# Patient Record
Sex: Male | Born: 1945 | Race: Black or African American | Hispanic: No | Marital: Single | Smoking: Former smoker
Health system: Southern US, Community
[De-identification: ages and names within clinical notes are randomized; demographics above are authoritative.]

## PROBLEM LIST (undated history)

## (undated) DIAGNOSIS — F4310 Post-traumatic stress disorder, unspecified: Secondary | ICD-10-CM

## (undated) DIAGNOSIS — E119 Type 2 diabetes mellitus without complications: Secondary | ICD-10-CM

## (undated) DIAGNOSIS — I10 Essential (primary) hypertension: Secondary | ICD-10-CM

## (undated) DIAGNOSIS — I2510 Atherosclerotic heart disease of native coronary artery without angina pectoris: Secondary | ICD-10-CM

## (undated) DIAGNOSIS — J449 Chronic obstructive pulmonary disease, unspecified: Secondary | ICD-10-CM

## (undated) DIAGNOSIS — N189 Chronic kidney disease, unspecified: Secondary | ICD-10-CM

## (undated) DIAGNOSIS — R410 Disorientation, unspecified: Secondary | ICD-10-CM

## (undated) DIAGNOSIS — L89159 Pressure ulcer of sacral region, unspecified stage: Secondary | ICD-10-CM

## (undated) DIAGNOSIS — R0600 Dyspnea, unspecified: Secondary | ICD-10-CM

## (undated) DIAGNOSIS — J189 Pneumonia, unspecified organism: Secondary | ICD-10-CM

## (undated) DIAGNOSIS — I739 Peripheral vascular disease, unspecified: Secondary | ICD-10-CM

## (undated) DIAGNOSIS — D649 Anemia, unspecified: Secondary | ICD-10-CM

## (undated) DIAGNOSIS — Z89612 Acquired absence of left leg above knee: Secondary | ICD-10-CM

## (undated) DIAGNOSIS — I509 Heart failure, unspecified: Secondary | ICD-10-CM

## (undated) HISTORY — DX: Heart failure, unspecified: I50.9

## (undated) HISTORY — PX: ABOVE KNEE LEG AMPUTATION: SUR20

## (undated) HISTORY — PX: LEG AMPUTATION ABOVE KNEE: SHX117

## (undated) HISTORY — PX: ABDOMINAL SURGERY: SHX537

---

## 2000-09-08 ENCOUNTER — Emergency Department (HOSPITAL_COMMUNITY): Admission: EM | Admit: 2000-09-08 | Discharge: 2000-09-08 | Payer: Self-pay | Admitting: *Deleted

## 2008-08-18 ENCOUNTER — Emergency Department: Payer: Self-pay | Admitting: Emergency Medicine

## 2008-08-18 ENCOUNTER — Other Ambulatory Visit: Payer: Self-pay

## 2009-12-12 ENCOUNTER — Emergency Department: Payer: Self-pay | Admitting: Emergency Medicine

## 2010-09-07 ENCOUNTER — Emergency Department: Payer: Self-pay | Admitting: Emergency Medicine

## 2014-08-30 ENCOUNTER — Non-Acute Institutional Stay (SKILLED_NURSING_FACILITY): Payer: PRIVATE HEALTH INSURANCE | Admitting: Internal Medicine

## 2014-08-30 ENCOUNTER — Encounter: Payer: Self-pay | Admitting: *Deleted

## 2014-08-30 ENCOUNTER — Non-Acute Institutional Stay: Payer: PRIVATE HEALTH INSURANCE | Admitting: Internal Medicine

## 2014-08-30 DIAGNOSIS — E1159 Type 2 diabetes mellitus with other circulatory complications: Secondary | ICD-10-CM

## 2014-08-30 DIAGNOSIS — L03119 Cellulitis of unspecified part of limb: Secondary | ICD-10-CM

## 2014-08-30 DIAGNOSIS — L89609 Pressure ulcer of unspecified heel, unspecified stage: Secondary | ICD-10-CM

## 2014-08-30 DIAGNOSIS — L02419 Cutaneous abscess of limb, unspecified: Secondary | ICD-10-CM

## 2014-08-30 NOTE — Telephone Encounter (Signed)
error 

## 2014-09-05 NOTE — Progress Notes (Addendum)
Patient ID: Blake Villarreal, male   DOB: 06-Nov-1946, 68 y.o.   MRN: [id number]               HISTORY & PHYSICAL  DATE:  69/15/2015    FAC[facility]    LEVEL OF CARE:   SNF   CHIEF COMPLAINT:  Admission to SNF, post transfer from the [facility], exact dates uncertain.    HISTORY OF PRESENT ILLNESS:  This is a 68 year-old man with a history of type 2 diabetes, neuropathy and vasculopathy.     He has a recent history of a fem-fem and left fem posterior tibial artery bypass on 04/29/2014.  He developed wound infection and he underwent bilateral groin explorations on 05/17/2014.  He had bilateral wound VACs applied at that time.  He was noted to have a right lower extremity pressure ulcer and he went to the OR on 05/24/2014 with plastic surgery for a muscle flap coverage of the deficits.  He had positive cultures from these wounds and Infectious Disease followed.  A CT scan at the time showed an 8 x 6 x 16 cm fluid collection in the right groin, compatible with an abscess.  He went to the OR on 06/06/2014 for a washout.  He was identified as having VRE.  He had vancomycin and then daptomycin.  However, he is listed as being allergic to daptomycin.    PAST MEDICAL HISTORY/PROBLEM LIST:  PTSD.    Continued tobacco use.    Hypertension.    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    Diabetic foot ulcers.    Dry skin.    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    BPH.    History of methicillin-resistant Staph aureus infection.   On Septra continuously for suppression.    Hyponatremia.    Major depression.    Alcohol dependence.    PAST SURGICAL HISTORY:    On 04/29/2014, he underwent a fem-fem and left fem distal arterial bypass.    On 05/17/2014, underwent bilateral groin explorations, requiring postoperative wound VACs.    On 05/24/2014, plastic surgery for muscle flap coverage.    On 06/06/2014, OR for washout of the right groin abscess.    CURRENT  MEDICATIONS:  Discharge medications include:      Tylenol 975 p.o. b.i.d. for pain, which is routine.    Amlodipine 10 mg daily.    ASA 81 q.d.    Atorvastatin 20 q.d.    Vitamin B12 1000 q.d.    Senokot-S, 2 tablets p.o. b.i.d.    Ferrous sulfate 325 daily.    Neurontin 200 b.i.d.    Magnesium oxide 800 b.i.d.    Remeron 15 q.h.s.    Nicotine lozenges q.i.d.    Oxycodone 5 mg, 2 tablets p.o. q.6 h p.r.n.    Risperidone 0.5 q.h.s.    Septra DS, 1 p.o. b.i.d. continuously for suppressive therapy.    Terazosin 5 mg q.h.s. for BPH.    Vitamin D, 50,000 U monthly.    SOCIAL HISTORY:   HOUSING:  The patient tells me he lives in an apartment in [place].   FUNCTIONAL STATUS:  Claims to have been independent.  He has a motorized scooter.   TOBACCO USE:  Continued smoker.    REVIEW OF SYSTEMS:   CHEST/RESPIRATORY:  No shortness of breath.  No cough.   CARDIAC:   No chest pain.   GI:  No abdominal pain.   MUSCULOSKELETAL:  Severe pain in the  right foot, some of which sounds like claudication.    PHYSICAL EXAMINATION:   GENERAL APPEARANCE:  Pleasant man in no distress.  I had to bring him in from the outside where he went to smoke.   CHEST/RESPIRATORY:  Clear air entry bilaterally.   CARDIOVASCULAR:  CARDIAC:   Heart sounds are normal.  There are no murmurs.   GASTROINTESTINAL:  LIVER/SPLEEN/KIDNEYS:  No liver, no spleen.  No tenderness.   GENITOURINARY:  BLADDER:   No suprapubic or costovertebral angle tenderness.   SKIN:  INSPECTION:  In the groin, I did not see any open areas here.  In the left anterior thigh is a sinus that is packed.  He has tremendous thickened skin in his left greater than right foot which I am sure is partially tinea, partially chronic venous disease.  He has eschar over his bilateral heels.  On the lateral part of his right leg is an open, ?surgical wound.  Again, covered with black eschar.  On the left foot, there is eschar over his toes and  also the base of the plantar aspect of the toes.   CIRCULATION:   ARTERIAL:   Strangely enough, although he has severe PVD, his feet, including his distal feet, feel warm.  Peripheral pulses, however, were not palpable.    ASSESSMENT/PLAN:  Type 2 diabetes with peripheral vascular disease, with a prolonged stay at the [name].  I think he was there from 05/30/2014 through 08/30/2014 for treatment of infected surgical wounds.  He is on aspirin, not Plavix.    Ischemic wounds on the bilateral heels, left toes, right anterior leg, and a draining sinus in the left thigh anteriorly.  All of these are surgical wounds.  None of them appear to be infected.    Hyperlipidemia.  On atorvastatin 10 mg.    Risperidone.  Probably used for posttraumatic stress.    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  I really do not have information here on this.    BPH.  There was no evidence of urinary retention.    On magnesium replacement.  His magnesium level will need to be checked.    Anemia.   His hemoglobin on 07/23/2014 was 8.6.  Indices normal.  White count and platelet count normal.  Differential count normal.    The tone of the treatment for his remaining wounds was palliative, although there have been comments that the heels themselves have improved.    He is not on any medication for his diabetes.  I am going to put him on surveillance CBGs.  Hemoglobin A1c might also be in order.    There was a suggestion that his graft material in the right groin is still infected and there is episodic drainage.

## 2014-09-09 ENCOUNTER — Non-Acute Institutional Stay (SKILLED_NURSING_FACILITY): Payer: PRIVATE HEALTH INSURANCE | Admitting: Internal Medicine

## 2014-09-09 DIAGNOSIS — E1159 Type 2 diabetes mellitus with other circulatory complications: Secondary | ICD-10-CM

## 2014-09-09 DIAGNOSIS — L89609 Pressure ulcer of unspecified heel, unspecified stage: Secondary | ICD-10-CM

## 2014-09-09 NOTE — Progress Notes (Signed)
Patient ID: JESSI JESSOP, male   DOB: 1945/12/19, 68 y.o.   MRN: [id number] Facility; [facility] Chief complaint; review of lower extremity wounds History; this is a patient that I admitted to the facility last week. Prior to this he was at [facility] and extensive set of difficult procedures in late June of this year related to peripheral vascular disease, attempts at revascularization, wound infections. He came here with ischemic wounds on his bilateral heels, right lateral leg, several of his toes on the right side, and also a draining sinus at the surgical incision in his left upper thigh. Has a tone of the discharge and treatment plan was purely palliative, the patient wanted a palliative approach to his condition including his lower extremity pain. I really didn't write aggressive wound care orders.   Lab work has come back showing a platelet cound of 546 (no doubt reactive). His glucose is 146 otherwise his basic metabolic panel is normal. BUN 10 creatinine 0.88 magnesium is 1.9  He continues to complain of pain in his bilateral feet which I think is claudication.  Physical examination Skin; his right great toenail was hanging off for a period using forceps and scalpel I removed this. I also crosshatched the eschar on his right lateral leg and right heel. The previous sinus in the surgical site in the left upper thighs closed over for the moment. I explored his right groin and I didn't see any open areas although the area will need to be cleaned.  Respiratory shallow but otherwise clear entry bilaterally Cardiac heart sounds are normal he appears to be euvolemic  Impression/plan #1 type 2 diabetes with severe PVD. In spite of the ominous prognosis here his right forefoot actually feels warm although slightly less so than the left. This is not this setting where I would've said it would be impossible to gain any healing of these wounds although the prognosis is no doubt poor. I have attempted  to remove some of the eschar on the right leg. We'll change the dressingsto santyl/ Medihoney 1.1. The eschar over the left heel is too thick for me to do anything with now will continue with moist gauze. I removed his nail on the right great toe with local anesthesia as it was ready to fall off. His nail bed had purulent looking material but without overt infection. Will use silver alginate here, #2 type 2 diabetes not currently on any agents/treatment; blood sugars are 160 in the morning to mid 200s later in the day. Think we can start him on an oral agent likely metformin #3 continued lower extremity pain which I think is claudication considering putting him on long-acting narcotics however all above for now. #4 He is a continued smoker. Ive talked to him about this

## 2014-09-09 NOTE — Addendum Note (Signed)
Addended by: Maxwell Caul on: 09/09/2014 10:02 AM   Modules accepted: Level of Service, SmartSet

## 2014-09-09 NOTE — Progress Notes (Signed)
Patient ID: Blake Villarreal, male   DOB: 1946-08-08, 68 y.o.   MRN: [id number]   HISTORY & PHYSICAL   DATE: 08/30/2014  [facility]  LEVEL OF CARE: SNF   CHIEF COMPLAINT: Admission to SNF, post transfer from the [facility], exact dates uncertain.  HISTORY OF PRESENT ILLNESS: This is a 68 year-old man with a history of type 2 diabetes, neuropathy and vasculopathy.  He has a recent history of a fem-fem and left fem posterior tibial artery bypass on 04/29/2014. He developed wound infection and he underwent bilateral groin explorations on 05/17/2014. He had bilateral wound VACs applied at that time. He was noted to have a right lower extremity pressure ulcer and he went to the OR on 05/24/2014 with plastic surgery for a muscle flap coverage of the deficits. He had positive cultures from these wounds and Infectious Disease followed. A CT scan at the time showed an 8 x 6 x 16 cm fluid collection in the right groin, compatible with an abscess. He went to the OR on 06/06/2014 for a washout. He was identified as having VRE. He had vancomycin and then daptomycin. However, he is listed as being allergic to daptomycin.  PAST MEDICAL HISTORY/PROBLEM LIST:  PTSD.  Continued tobacco use.  Hypertension.  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  Diabetic foot ulcers.  Dry skin.  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  BPH.  History of methicillin-resistant Staph aureus infection. On Septra continuously for suppression.  Hyponatremia.  Major depression.  Alcohol dependence.  PAST SURGICAL HISTORY:  On 04/29/2014, he underwent a fem-fem and left fem distal arterial bypass.  On 05/17/2014, underwent bilateral groin explorations, requiring postoperative wound VACs.  On 05/24/2014, plastic surgery for muscle flap coverage.  On 06/06/2014, OR for washout of the right groin abscess.  CURRENT MEDICATIONS: Discharge medications include:  Tylenol 975 p.o. b.i.d. for pain, which is routine.   Amlodipine 10 mg daily.  ASA 81 q.d.  Atorvastatin 20 q.d.  Vitamin B12 1000 q.d.  Senokot-S, 2 tablets p.o. b.i.d.  Ferrous sulfate 325 daily.  Neurontin 200 b.i.d.  Magnesium oxide 800 b.i.d.  Remeron 15 q.h.s.  Nicotine lozenges q.i.d.  Oxycodone 5 mg, 2 tablets p.o. q.6 h p.r.n.  Risperidone 0.5 q.h.s.  Septra DS, 1 p.o. b.i.d. continuously for suppressive therapy.  Terazosin 5 mg q.h.s. for BPH.  Vitamin D, 50,000 U monthly.   SOCIAL HISTORY:  HOUSING: The patient tells me he lives in an apartment in [place].  FUNCTIONAL STATUS: Claims to have been independent. He has a motorized scooter.  TOBACCO USE: Continued smoker.  REVIEW OF SYSTEMS:  CHEST/RESPIRATORY: No shortness of breath. No cough.  CARDIAC: No chest pain.  GI: No abdominal pain.  MUSCULOSKELETAL: Severe pain in the right foot, some of which sounds like claudication.  PHYSICAL EXAMINATION:  GENERAL APPEARANCE: Pleasant man in no distress. I had to bring him in from the outside where he went to smoke.  CHEST/RESPIRATORY: Clear air entry bilaterally.  CARDIOVASCULAR:  CARDIAC: Heart sounds are normal. There are no murmurs.  GASTROINTESTINAL:  LIVER/SPLEEN/KIDNEYS: No liver, no spleen. No tenderness.  GENITOURINARY:  BLADDER: No suprapubic or costovertebral angle tenderness.  SKIN:  INSPECTION: In the groin, I did not see any open areas here. In the left anterior thigh is a sinus that is packed. He has tremendous thickened skin in his left greater than right foot which I am sure is partially tinea, partially chronic venous disease. He has eschar over his bilateral  heels. On the lateral part of his right leg is an open, ?surgical wound. Again, covered with black eschar. On the left foot, there is eschar over his toes and also the base of the plantar aspect of the toes.  CIRCULATION:  ARTERIAL: Strangely enough, although he has severe PVD, his feet, including his distal feet, feel warm. Peripheral pulses, however,  were not palpable.   ASSESSMENT/PLAN:  Type 2 diabetes with peripheral vascular disease, with a prolonged stay at the [name]. I think he was there from 05/30/2014 through 08/30/2014 for treatment of infected surgical wounds. He is on aspirin, not Plavix.  Ischemic wounds on the bilateral heels, left toes, right anterior leg, and a draining sinus in the left thigh anteriorly. All of these are surgical wounds. None of them appear to be infected.  Hyperlipidemia. On atorvastatin 10 mg.  Risperidone. Probably used for posttraumatic stress.  History of MRSA and VRE. On chronic suppressive Septra. I really do not have information here on this.  BPH. There was no evidence of urinary retention.  On magnesium replacement. His magnesium level will need to be checked.  Anemia. His hemoglobin on 07/23/2014 was 8.6. Indices normal. White count and platelet count normal. Differential count normal.   The tone of the treatment for his remaining wounds was palliative, although there have been comments that the heels themselves have improved.  He is not on any medication for his diabetes. I am going to put him on surveillance CBGs. Hemoglobin A1c might also be in order.   There was a suggestion that his graft material in the right groin is still infected and there is episodic drainage.

## 2014-09-09 NOTE — Progress Notes (Signed)
This encounter was created in error - please disregard.

## 2014-09-15 ENCOUNTER — Non-Acute Institutional Stay (SKILLED_NURSING_FACILITY): Payer: PRIVATE HEALTH INSURANCE | Admitting: Internal Medicine

## 2014-09-15 DIAGNOSIS — L97511 Non-pressure chronic ulcer of other part of right foot limited to breakdown of skin: Secondary | ICD-10-CM

## 2014-09-15 DIAGNOSIS — E1151 Type 2 diabetes mellitus with diabetic peripheral angiopathy without gangrene: Secondary | ICD-10-CM

## 2014-09-15 DIAGNOSIS — L97211 Non-pressure chronic ulcer of right calf limited to breakdown of skin: Secondary | ICD-10-CM

## 2014-09-19 NOTE — Progress Notes (Signed)
Patient ID: Blake Villarreal, male   DOB: 1946-06-01, 68 y.o.   MRN: [id number]               PROGRESS NOTE  DATE:  09/15/2014    [facility]    LEVEL OF CARE:   SNF   Acute Visit   HISTORY OF PRESENT ILLNESS:  This is a patient whom I admitted to the facility earlier last month.  Prior to this, [facility] and had an extensive set of difficult procedures in late June of this year related to peripheral vascular disease and attempts at revascularization, wound infections, etc.    He came to this facility with ischemic wounds on his bilateral heels and right lateral leg.  In spite of the ominous prognosis for healing these, I felt that he had enough circulation to consider an attempt to help the wounds, especially on his right leg.  When I was here last time, I cross-hatched the wounds on the right heel and right lateral leg.    PHYSICAL EXAMINATION:   SKIN:  INSPECTION:  I removed the thick black eschar on the right heel and also re-cross-hatched the eschar on the right lateral leg.  We will continue to use the Santyl/Medihoney based dressings to this area, and this will require further debridements.    ASSESSMENT/PLAN:  Type 2 diabetes with severe peripheral vascular disease.  We will continue the Santyl/Medihoney based dressings.  Status post mechanical debridement today.    CPT CODE: [id number]

## 2014-09-30 ENCOUNTER — Non-Acute Institutional Stay (SKILLED_NURSING_FACILITY): Payer: PRIVATE HEALTH INSURANCE | Admitting: Internal Medicine

## 2014-09-30 DIAGNOSIS — L97211 Non-pressure chronic ulcer of right calf limited to breakdown of skin: Secondary | ICD-10-CM

## 2014-09-30 DIAGNOSIS — E1151 Type 2 diabetes mellitus with diabetic peripheral angiopathy without gangrene: Secondary | ICD-10-CM

## 2014-10-05 NOTE — Progress Notes (Addendum)
Patient ID: Blake Villarreal, male   DOB: 03/10/1946, 68 y.o.   MRN: [id number]                PROGRESS NOTE  DATE:  09/30/2014    FACILITY: [facility]    LEVEL OF CARE:   SNF   Acute Visit   CHIEF COMPLAINT:  Follow up pain, wound review.    HISTORY OF PRESENT ILLNESS:  This is a gentleman who came to us [facility] the [facility] with an extensive history of attempts at revascularization and wound complications.  He is a type 2 diabetic.  He suffered infectious complications of extensive surgical wounds.    He came here with a draining sinus in the left anterior thigh and right groin.  Both of these have closed over.    He had ischemic-looking wounds on the right lateral leg, right heel, left heel.  We have been using Santyl and Medihoney to these areas.    He also had thick, lichenified skin on both feet, especially the right.  We have been using foot soaks, lubricating cream.  Most of this on the right foot has come off.  Unfortunately, he has been left with more wound area on the right lateral foot.     More problematic than this, he has developed pretty significant pain in the right leg.  I suspect this is indeed claudication type pain.    PHYSICAL EXAMINATION:   SKIN:  INSPECTION:   Right leg:  There are ischemic wounds on the right lateral leg, right heel, now the right lateral foot.  More worrisome than this, the area on the dorsal aspect of the right foot appears to be draining.    ASSESSMENT/PLAN:  PAD with pain at rest.  I had some optimism about this initially, although I think this was probably ill-founded.  I am going to continue Santyl-based dressings to these wounds, as well as silver alginate over the foot.  I am going to start him on doxycycline, although I think what is happening in the right foot is probably ischemic.  His pain issues are real.  He has oxycodone 5 mg q.6 hours for mild to moderate pain, 10 mg for severe pain.  I am going to put him on OxyContin,  change him to the tube p.o. q.6 hours p.r.n.

## 2014-10-14 ENCOUNTER — Non-Acute Institutional Stay (SKILLED_NURSING_FACILITY): Payer: PRIVATE HEALTH INSURANCE | Admitting: Internal Medicine

## 2014-10-14 DIAGNOSIS — L97511 Non-pressure chronic ulcer of other part of right foot limited to breakdown of skin: Secondary | ICD-10-CM

## 2014-10-14 DIAGNOSIS — L97211 Non-pressure chronic ulcer of right calf limited to breakdown of skin: Secondary | ICD-10-CM

## 2014-10-14 DIAGNOSIS — E1151 Type 2 diabetes mellitus with diabetic peripheral angiopathy without gangrene: Secondary | ICD-10-CM

## 2014-10-18 NOTE — Progress Notes (Signed)
Patient ID: Blake Villarreal, male   DOB: 1946-10-09, 68 y.o.   MRN: [id number]               PROGRESS NOTE  DATE:  10/14/2014     FACILITY: [facility]    LEVEL OF CARE:   SNF   Acute Visit   CHIEF COMPLAINT:  Declining situation in right foot.    HISTORY OF PRESENT ILLNESS:  Blake Villarreal is a gentleman who came here after a complex stay at the [facility].  He had attempts at revascularization.  He developed wound dehiscence and infection.  He ultimately required plastic surgery.    When he came here, he had ischemic wounds on his right lateral foot, right heel, and right distal leg.  He has a wound with eschar on the left heel, although this has remained stable.    Unfortunately, he has developed worsening ischemic change in the right forefoot.  This is compatible with advancing dry gangrene and now involving most of his forefoot.  He has been in increasing pain.   My understanding is that, from a revascularization point of view, everything that could be done was done while he was at the hospital before he came here.  This was a palliative situation.  However, the increasing pain and increasing amount of forefoot necrosis likely will require a definitive amputation.     PHYSICAL EXAMINATION:   SKIN:  INSPECTION:  Right foot:  There is advancing gangrene at the top of the foot on the dorsal aspect.  The wound on the right lateral leg and right heel are roughly stable.  He has had increasing difficulties with discomfort.  This does not appear to be infected.     ASSESSMENT/PLAN:  Type 2 diabetes with PAD, advancing gangrene of the right foot.  I have increased his OxyContin from 30 to 50 b.i.d.  He has oxycodone p.r.n.  He is outside to smoke most of the day.  This, no doubt, has contributed.  Nevertheless, I think this is a deteriorating situation.  I think he is going to need an amputation which will probably be above-knee, although I will leave that to [facility].  I have  spoken to him about this in some detail and the plan would be to send him back to [facility] to address increasing painful ischemia of the right foot.    CPT CODE: [id number]

## 2016-01-06 ENCOUNTER — Other Ambulatory Visit
Admission: RE | Admit: 2016-01-06 | Discharge: 2016-01-06 | Disposition: A | Discharge status: Home / Self Care | Payer: Medicare Other | Source: Ambulatory Visit | Attending: Physician Assistant | Admitting: Physician Assistant

## 2016-01-06 DIAGNOSIS — L089 Local infection of the skin and subcutaneous tissue, unspecified: Secondary | ICD-10-CM | POA: Diagnosis present

## 2016-01-06 DIAGNOSIS — B9562 Methicillin resistant Staphylococcus aureus infection as the cause of diseases classified elsewhere: Secondary | ICD-10-CM | POA: Insufficient documentation

## 2016-01-06 LAB — VANCOMYCIN, TROUGH
Vancomycin Tr: 15 ug/mL (ref 10–20)
Vancomycin Tr: 38 ug/mL (ref 10–20)

## 2016-01-10 ENCOUNTER — Other Ambulatory Visit
Admission: RE | Admit: 2016-01-10 | Discharge: 2016-01-10 | Disposition: A | Discharge status: Home / Self Care | Payer: Self-pay | Source: Ambulatory Visit | Attending: Physician Assistant | Admitting: Physician Assistant

## 2016-01-10 DIAGNOSIS — A4902 Methicillin resistant Staphylococcus aureus infection, unspecified site: Secondary | ICD-10-CM | POA: Insufficient documentation

## 2016-01-10 DIAGNOSIS — T148 Other injury of unspecified body region: Secondary | ICD-10-CM | POA: Insufficient documentation

## 2016-01-10 LAB — CREATININE, SERUM
Creatinine, Ser: 0.9 mg/dL (ref 0.61–1.24)
GFR calc Af Amer: >60 mL/min (ref >60)
GFR calc non Af Amer: >60 mL/min (ref >60)

## 2016-01-10 LAB — VANCOMYCIN, TROUGH: Vancomycin Tr: 10 ug/mL (ref 10–20)

## 2016-01-15 ENCOUNTER — Other Ambulatory Visit
Admission: RE | Admit: 2016-01-15 | Discharge: 2016-01-15 | Disposition: A | Discharge status: Home / Self Care | Payer: Medicare Other | Source: Ambulatory Visit | Attending: Nurse Practitioner | Admitting: Nurse Practitioner

## 2016-01-15 DIAGNOSIS — L02214 Cutaneous abscess of groin: Secondary | ICD-10-CM | POA: Diagnosis present

## 2016-01-15 LAB — CREATININE, SERUM
CREATININE: 0.89 mg/dL (ref 0.61–1.24)
GFR calc Af Amer: >60 mL/min (ref >60)

## 2016-01-15 LAB — VANCOMYCIN, TROUGH: VANCOMYCIN TR: 11 ug/mL (ref 10–20)

## 2016-01-18 ENCOUNTER — Other Ambulatory Visit
Admission: RE | Admit: 2016-01-18 | Discharge: 2016-01-18 | Disposition: A | Discharge status: Home / Self Care | Payer: Medicare Other | Source: Ambulatory Visit | Attending: Physician Assistant | Admitting: Physician Assistant

## 2016-01-20 ENCOUNTER — Other Ambulatory Visit
Admission: RE | Admit: 2016-01-20 | Discharge: 2016-01-20 | Disposition: A | Discharge status: Home / Self Care | Payer: Self-pay | Source: Skilled Nursing Facility | Attending: Physician Assistant | Admitting: Physician Assistant

## 2016-01-20 DIAGNOSIS — A4902 Methicillin resistant Staphylococcus aureus infection, unspecified site: Secondary | ICD-10-CM | POA: Insufficient documentation

## 2016-01-20 LAB — CREATININE, SERUM: CREATININE: 0.79 mg/dL (ref 0.61–1.24)

## 2016-01-20 LAB — VANCOMYCIN, TROUGH: VANCOMYCIN TR: 9 ug/mL — AB (ref 10–20)

## 2017-05-02 ENCOUNTER — Other Ambulatory Visit
Admission: RE | Admit: 2017-05-02 | Discharge: 2017-05-02 | Disposition: A | Discharge status: Home / Self Care | Payer: Medicare Other | Source: Other Acute Inpatient Hospital | Attending: Family Medicine | Admitting: Family Medicine

## 2017-05-02 DIAGNOSIS — Z114 Encounter for screening for human immunodeficiency virus [HIV]: Secondary | ICD-10-CM | POA: Diagnosis present

## 2017-05-02 DIAGNOSIS — Z1159 Encounter for screening for other viral diseases: Secondary | ICD-10-CM | POA: Diagnosis not present

## 2017-05-02 LAB — RAPID HIV SCREEN (HIV 1/2 AB+AG)
HIV 1/2 Antibodies: NONREACTIVE
HIV-1 P24 Antigen - HIV24: NONREACTIVE

## 2017-05-07 LAB — HEPATITIS B SURFACE ANTIGEN: Hepatitis B Surface Ag: NEGATIVE

## 2017-05-30 ENCOUNTER — Emergency Department
Admission: EM | Admit: 2017-05-30 | Discharge: 2017-05-30 | Payer: Medicare Other | Attending: Emergency Medicine | Admitting: Emergency Medicine

## 2017-05-30 ENCOUNTER — Emergency Department: Payer: Medicare Other

## 2017-05-30 DIAGNOSIS — Z7984 Long term (current) use of oral hypoglycemic drugs: Secondary | ICD-10-CM | POA: Insufficient documentation

## 2017-05-30 DIAGNOSIS — E119 Type 2 diabetes mellitus without complications: Secondary | ICD-10-CM | POA: Insufficient documentation

## 2017-05-30 DIAGNOSIS — R000 Tachycardia, unspecified: Secondary | ICD-10-CM | POA: Insufficient documentation

## 2017-05-30 DIAGNOSIS — R1110 Vomiting, unspecified: Secondary | ICD-10-CM | POA: Diagnosis not present

## 2017-05-30 DIAGNOSIS — A419 Sepsis, unspecified organism: Secondary | ICD-10-CM | POA: Diagnosis not present

## 2017-05-30 DIAGNOSIS — Z794 Long term (current) use of insulin: Secondary | ICD-10-CM | POA: Insufficient documentation

## 2017-05-30 DIAGNOSIS — R509 Fever, unspecified: Secondary | ICD-10-CM | POA: Diagnosis present

## 2017-05-30 DIAGNOSIS — Z79899 Other long term (current) drug therapy: Secondary | ICD-10-CM | POA: Insufficient documentation

## 2017-05-30 HISTORY — DX: Type 2 diabetes mellitus without complications: E11.9

## 2017-05-30 LAB — COMPREHENSIVE METABOLIC PANEL
ALBUMIN: 3.7 g/dL (ref 3.5–5.0)
ALK PHOS: 62 U/L (ref 38–126)
ALT: 8 U/L — ABNORMAL LOW (ref 17–63)
ANION GAP: 8 (ref 5–15)
AST: 17 U/L (ref 15–41)
BUN: 19 mg/dL (ref 6–20)
CALCIUM: 9.4 mg/dL (ref 8.9–10.3)
CHLORIDE: 106 mmol/L (ref 101–111)
CO2: 22 mmol/L (ref 22–32)
Creatinine, Ser: 0.99 mg/dL (ref 0.61–1.24)
GFR calc non Af Amer: >60 mL/min (ref >60)
GLUCOSE: 135 mg/dL — AB (ref 65–99)
POTASSIUM: 3.9 mmol/L (ref 3.5–5.1)
SODIUM: 136 mmol/L (ref 135–145)
Total Bilirubin: 0.5 mg/dL (ref 0.3–1.2)
Total Protein: 7.7 g/dL (ref 6.5–8.1)

## 2017-05-30 LAB — CBC WITH DIFFERENTIAL/PLATELET
BASOS PCT: 1 %
Basophils Absolute: 0.1 10*3/uL (ref 0–0.1)
Eosinophils Absolute: 0 10*3/uL (ref 0–0.7)
Eosinophils Relative: 0 %
HEMATOCRIT: 30.1 % — AB (ref 40.0–52.0)
HEMOGLOBIN: 9.8 g/dL — AB (ref 13.0–18.0)
LYMPHS PCT: 4 %
Lymphs Abs: 0.9 10*3/uL — ABNORMAL LOW (ref 1.0–3.6)
MCH: 28 pg (ref 26.0–34.0)
MCHC: 32.5 g/dL (ref 32.0–36.0)
MCV: 86.1 fL (ref 80.0–100.0)
MONO ABS: 0.6 10*3/uL (ref 0.2–1.0)
Monocytes Relative: 3 %
NEUTROS ABS: 19.1 10*3/uL — AB (ref 1.4–6.5)
NEUTROS PCT: 92 %
Platelets: 361 10*3/uL (ref 150–440)
RBC: 3.49 MIL/uL — ABNORMAL LOW (ref 4.40–5.90)
RDW: 15.7 % — ABNORMAL HIGH (ref 11.5–14.5)
WBC: 20.6 10*3/uL — ABNORMAL HIGH (ref 3.8–10.6)

## 2017-05-30 LAB — URINALYSIS, COMPLETE (UACMP) WITH MICROSCOPIC
BACTERIA UA: NONE SEEN
Bilirubin Urine: NEGATIVE
Glucose, UA: NEGATIVE mg/dL
KETONES UR: 5 mg/dL — AB
Leukocytes, UA: NEGATIVE
NITRITE: NEGATIVE
PROTEIN: 30 mg/dL — AB
Specific Gravity, Urine: 1.02 (ref 1.005–1.030)
pH: 5 (ref 5.0–8.0)

## 2017-05-30 LAB — LACTIC ACID, PLASMA: Lactic Acid, Venous: 1.6 mmol/L (ref 0.5–1.9)

## 2017-05-30 MED ORDER — VANCOMYCIN HCL IN DEXTROSE 1-5 GM/200ML-% IV SOLN
1000.0000 mg | Freq: Once | INTRAVENOUS | Status: AC
  Administered 2017-05-30: 1000 mg via INTRAVENOUS
  Filled 2017-05-30: qty 200

## 2017-05-30 MED ORDER — ACETAMINOPHEN 500 MG PO TABS
1000.0000 mg | ORAL_TABLET | Freq: Once | ORAL | Status: AC
  Administered 2017-05-30: 1000 mg via ORAL
  Filled 2017-05-30: qty 2

## 2017-05-30 MED ORDER — IOPAMIDOL (ISOVUE-300) INJECTION 61%
100.0000 mL | Freq: Once | INTRAVENOUS | Status: AC | PRN
  Administered 2017-05-30: 100 mL via INTRAVENOUS

## 2017-05-30 MED ORDER — DEXTROSE 5 % IV SOLN
1.0000 g | Freq: Once | INTRAVENOUS | Status: AC
  Administered 2017-05-30: 1 g via INTRAVENOUS
  Filled 2017-05-30: qty 10

## 2017-05-30 MED ORDER — SODIUM CHLORIDE 0.9 % IV BOLUS (SEPSIS)
1000.0000 mL | Freq: Once | INTRAVENOUS | Status: AC
  Administered 2017-05-30: 1000 mL via INTRAVENOUS

## 2017-05-30 NOTE — ED Provider Notes (Signed)
[facility] Emergency Department Provider Note  ____________________________________________  Time seen: Approximately 3:17 PM  I have reviewed the triage vital signs and the nursing notes.   HISTORY  Chief Complaint Fever; Emesis; and Tachycardia   HPI Blake Villarreal is a 71 y.o. male with h/o DM, R AKA, non healing Left foot ulcer, PVD, HNT who presents for evaluation offever. Patient was in his usual state of health yesterday evening. Today he spiked a fever of 102.30F. He had 2 episodes of nonbloody nonbilious emesis. He denies diarrhea, cough, congestion, headaches, neck stiffness, rash, shortness of breath, chest pain, abdominal pain. He tells me that he is incontinent of urine and stool . Patient denies increased stool output. Denies dysuria. Patient tells me that he feels great and has no complaints at this time.  Past Medical History:  Diagnosis Date  . Diabetes mellitus without complication (HCC)     There are no active problems to display for this patient.   Past Surgical History:  Procedure Laterality Date  . ABOVE KNEE LEG AMPUTATION      Prior to Admission medications   Medication Sig Start Date End Date Taking? Authorizing Provider  acetaminophen (TYLENOL) 325 MG tablet Take 650 mg by mouth every 6 (six) hours as needed.   Yes [provider]  albuterol (PROVENTIL) (2.5 MG/3ML) 0.083% nebulizer solution Take 2 ampules by nebulization every 6 (six) hours as needed for wheezing.   Yes [provider]  amLODipine (NORVASC) 10 MG tablet Take 10 mg by mouth daily.   Yes [provider]  atorvastatin (LIPITOR) 20 MG tablet Take 20 mg by mouth daily.   Yes [provider]  Cholecalciferol (VITAMIN D3) 2000 units TABS Take 1 tablet by mouth daily.   Yes [provider]  docusate sodium (COLACE) 100 MG capsule Take 100 mg by mouth daily.   Yes [provider]  doxycycline (DORYX) 100 MG EC  tablet Take 100 mg by mouth 2 (two) times daily.   Yes [provider]  ferrous sulfate 325 (65 FE) MG EC tablet Take 325 mg by mouth 2 (two) times daily.   Yes [provider]  gabapentin (NEURONTIN) 100 MG capsule Take 100 mg by mouth 3 (three) times daily.   Yes [provider]  hydrocerin (EUCERIN) CREA Apply 1 application topically 2 (two) times daily.   Yes [provider]  HYDROcodone-acetaminophen (NORCO/VICODIN) 5-325 MG tablet Take 1 tablet by mouth every 6 (six) hours as needed for moderate pain.   Yes [provider]  insulin glargine (LANTUS) 100 UNIT/ML injection Inject 10 Units into the skin at bedtime.   Yes [provider]  lisinopril (PRINIVIL,ZESTRIL) 40 MG tablet Take 40 mg by mouth daily.   Yes [provider]  magnesium oxide (MAG-OX) 400 MG tablet Take 17 mg by mouth daily.   Yes [provider]  metFORMIN (GLUCOPHAGE) 500 MG tablet Take 500 mg by mouth 2 (two) times daily with a meal.   Yes [provider]  mirtazapine (REMERON) 15 MG tablet Take 7.5 mg by mouth at bedtime.   Yes [provider]  omeprazole (PRILOSEC) 20 MG capsule Take 20 mg by mouth daily.   Yes [provider]  risperiDONE (RISPERDAL) 0.25 MG tablet Take 0.25 mg by mouth at bedtime.   Yes [provider]  terazosin (HYTRIN) 5 MG capsule Take 5 mg by mouth at bedtime.   Yes [provider]  vitamin B-12 (  CYANOCOBALAMIN) 1000 MCG tablet Take 1,000 mcg by mouth daily.   Yes [provider]    Allergies Patient has no allergy information on record.  No family history on file.  Social History Social History  Substance Use Topics  . Smoking status: Never Smoker  . Smokeless tobacco: Never Used  . Alcohol use No    Review of Systems  Constitutional: + fever. Eyes: Negative for visual changes. ENT: Negative for sore throat. Neck: No neck pain  Cardiovascular: Negative for  chest pain. Respiratory: Negative for shortness of breath. Gastrointestinal: Negative for abdominal pain, vomiting or diarrhea. Genitourinary: Negative for dysuria. Musculoskeletal: Negative for back pain. Skin: Negative for rash. Neurological: Negative for headaches, weakness or numbness. Psych: No SI or HI  ____________________________________________   PHYSICAL EXAM:  VITAL SIGNS: ED Triage Vitals  Enc Vitals Group     BP 05/30/17 1501 (!) 145/59     Pulse Rate 05/30/17 1501 (!) 107     Resp 05/30/17 1501 (!) 21     Temp 05/30/17 1501 (!) 102.1 F (38.9 C)     Temp Source 05/30/17 1501 Oral     SpO2 05/30/17 1501 95 %     Weight 05/30/17 1455 165 lb (74.8 kg)     Height 05/30/17 1455 5\' 8"  (1.727 m)     Head Circumference --      Peak Flow --      Pain Score --      Pain Loc --      Pain Edu? --      Excl. in GC? --     Constitutional: Alert and oriented. Well appearing and in no apparent distress. HEENT:      Head: Normocephalic and atraumatic.         Eyes: Conjunctivae are normal. Sclera is non-icteric.       Mouth/Throat: Mucous membranes are moist.       Neck: Supple with no signs of meningismus. Cardiovascular: Tachycardic with regular rhythm. No murmurs, gallops, or rubs. 2+ symmetrical distal pulses are present in all extremities. No JVD. Respiratory: Normal respiratory effort. Lungs are clear to auscultation bilaterally with faint bilateral crackles.  Gastrointestinal: Soft, non tender, and non distended with positive bowel sounds. No rebound or guarding. Genitourinary: No CVA tenderness. Musculoskeletal: AKA on the R, non healing ulcer on the L foot with no evidence of active infection. Neurologic: Normal speech and language. Face is symmetric. Moving all extremities. No gross focal neurologic deficits are appreciated. Skin: Skin is warm, dry and intact. No rash noted. Psychiatric: Mood and affect are normal. Speech and behavior are  normal.  ____________________________________________   LABS (all labs ordered are listed, but only abnormal results are displayed)  Labs Reviewed  COMPREHENSIVE METABOLIC PANEL - Abnormal; Notable for the following:       Result Value   Glucose, Bld 135 (*)    ALT 8 (*)    All other components within normal limits  CBC WITH DIFFERENTIAL/PLATELET - Abnormal; Notable for the following:    WBC 20.6 (*)    RBC 3.49 (*)    Hemoglobin 9.8 (*)    HCT 30.1 (*)    RDW 15.7 (*)    Neutro Abs 19.1 (*)    Lymphs Abs 0.9 (*)    All other components within normal limits  URINALYSIS, COMPLETE (UACMP) WITH MICROSCOPIC - Abnormal; Notable for the following:    Color, Urine YELLOW (*)    APPearance CLEAR (*)    Hgb  urine dipstick SMALL (*)    Ketones, ur 5 (*)    Protein, ur 30 (*)    Squamous Epithelial / LPF 0-5 (*)    All other components within normal limits  CULTURE, BLOOD (ROUTINE X 2)  CULTURE, BLOOD (ROUTINE X 2)  LACTIC ACID, PLASMA   ____________________________________________  EKG  ED ECG REPORT I, Nita Sickle, the attending physician, personally viewed and interpreted this ECG.  Normal sinus rhythm, rate of 99, normal intervals, normal axis, no ST elevations or depressions, Q waves on anterior leads. Unchanged from prior from 2009 ____________________________________________  RADIOLOGY  CXR:  1. No calcaneus osteomyelitis or fracture identified. A dedicated Os calcis x-ray would be complementary in this setting. 2. No acute osseous abnormality identified. 3. Calcified peripheral vascular disease.  CT a/p: 1. Severe vascular disease with extensive aortic and branch vessel calcifications. The left common iliac artery is occluded and there is a fem-fem bypass graft which appears patent. Three-vessel coronary artery calcifications are also noted. 2. Chronic rounded atelectasis at the left lung base. 3. Extensive pleural calcifications bilaterally. 4. Left-sided  pelvic and inguinal lymphadenopathy. Could not exclude lymphoma. Biopsy of one of the inguinal lymph nodes may be helpful. No mesenteric or retroperitoneal lymphadenopathy. ____________________________________________   PROCEDURES  Procedure(s) performed: None Procedures Critical Care performed: yes  CRITICAL CARE Performed by: Nita Sickle  ?  Total critical care time: 45 min  Critical care time was exclusive of separately billable procedures and treating other patients.  Critical care was necessary to treat or prevent imminent or life-threatening deterioration.  Critical care was time spent personally by me on the following activities: development of treatment plan with patient and/or surrogate as well as nursing, discussions with consultants, evaluation of patient's response to treatment, examination of patient, obtaining history from patient or surrogate, ordering and performing treatments and interventions, ordering and review of laboratory studies, ordering and review of radiographic studies, pulse oximetry and re-evaluation of patient's condition.  ____________________________________________   INITIAL IMPRESSION / ASSESSMENT AND PLAN / ED COURSE  71 y.o. male with h/o DM, R AKA, non healing Left foot ulcer, PVD, HNT who presents for evaluation offever and two episodes of NBNB emesis. Patient has no complaints at this time howevere does meet sepsis criteria and sepsis protocol initiated. Sats are normal but patient has faint crackles on bilateral bases. Ulcer is non healed but does not look infected. Patient is receiving close care by wound clinic. Abdomen is soft and non tender. Will check labs, urine, CXR, and foot XR for possible etiology of sepsis/ Will give tylenol, IVF, and IV abx.  Clinical Course as of May 30 2344  Fri May 30, 2017  1906 Urine with no evidence of infection. CT abdomen and pelvis shows diffuse atherosclerotic disease but no signs of ischemic  bowel and low suspicion with normal lactate and no abdominal pain. Leukocytosis of 20K, unlcear source of infection. Negative XR of foot and CXR. Patient received rocephin and vancomycin. Will transfer to [facility] for further management.  [CV]    Clinical Course User Index [CV] Nita Sickle, MD    Pertinent labs & imaging results that were available during my care of the patient were reviewed by me and considered in my medical decision making (see chart for details).    ____________________________________________   FINAL CLINICAL IMPRESSION(S) / ED DIAGNOSES  Final diagnoses:  Sepsis, due to unspecified organism [facility])      NEW MEDICATIONS STARTED DURING THIS VISIT:  New Prescriptions  No medications on file     Note:  This document was prepared using Dragon voice recognition software and may include unintentional dictation errors.    Don Perking, [place], MD 05/30/17 [phone]

## 2017-05-30 NOTE — ED Notes (Signed)
Code  Sepsis  Called  To  [facility] 

## 2017-05-30 NOTE — ED Notes (Addendum)
Pt cleaned up and changed. Barrier cream applied to pt's bottom. Pt repositioned and resting comfortably.

## 2017-05-30 NOTE — ED Triage Notes (Signed)
[facility] for [facility]/o fever and tachycardia. Per EMS pt had fever of 102.9 orally and was given Tyenol at the facility with no relief. Pt states he has also been vomiting. EMS also reports [facility]. VS BP-164/74, temp 102.9 orally, HR 105 and BS 158.

## 2017-05-30 NOTE — ED Notes (Signed)
[facility] called and [facility].

## 2017-05-30 NOTE — ED Notes (Signed)
Pt had large dark colored BM. Pt cleaned up and repositioned in the bed. Pt resting comfortably

## 2017-06-04 LAB — CULTURE, BLOOD (ROUTINE X 2)
Culture: NO GROWTH
Culture: NO GROWTH

## 2017-08-15 ENCOUNTER — Emergency Department: Payer: Non-veteran care

## 2017-08-15 ENCOUNTER — Inpatient Hospital Stay: Payer: Non-veteran care

## 2017-08-15 ENCOUNTER — Encounter: Payer: Self-pay | Admitting: Emergency Medicine

## 2017-08-15 ENCOUNTER — Inpatient Hospital Stay (HOSPITAL_COMMUNITY)
Admit: 2017-08-15 | Discharge: 2017-08-15 | Disposition: A | Discharge status: Home / Self Care | Payer: Non-veteran care | Attending: Critical Care Medicine | Admitting: Critical Care Medicine

## 2017-08-15 ENCOUNTER — Inpatient Hospital Stay
Admission: EM | Admit: 2017-08-15 | Discharge: 2017-08-22 | DRG: 208 | Disposition: A | Discharge status: Home / Self Care | Payer: Non-veteran care | Attending: Internal Medicine | Admitting: Internal Medicine

## 2017-08-15 ENCOUNTER — Inpatient Hospital Stay (HOSPITAL_COMMUNITY): Payer: Non-veteran care

## 2017-08-15 DIAGNOSIS — D62 Acute posthemorrhagic anemia: Secondary | ICD-10-CM | POA: Diagnosis present

## 2017-08-15 DIAGNOSIS — K2971 Gastritis, unspecified, with bleeding: Secondary | ICD-10-CM | POA: Diagnosis present

## 2017-08-15 DIAGNOSIS — Z7189 Other specified counseling: Secondary | ICD-10-CM

## 2017-08-15 DIAGNOSIS — Z978 Presence of other specified devices: Secondary | ICD-10-CM

## 2017-08-15 DIAGNOSIS — T380X5A Adverse effect of glucocorticoids and synthetic analogues, initial encounter: Secondary | ICD-10-CM | POA: Diagnosis not present

## 2017-08-15 DIAGNOSIS — G4733 Obstructive sleep apnea (adult) (pediatric): Secondary | ICD-10-CM | POA: Diagnosis present

## 2017-08-15 DIAGNOSIS — E875 Hyperkalemia: Secondary | ICD-10-CM | POA: Diagnosis present

## 2017-08-15 DIAGNOSIS — I509 Heart failure, unspecified: Secondary | ICD-10-CM | POA: Diagnosis present

## 2017-08-15 DIAGNOSIS — Z79899 Other long term (current) drug therapy: Secondary | ICD-10-CM | POA: Diagnosis not present

## 2017-08-15 DIAGNOSIS — Z794 Long term (current) use of insulin: Secondary | ICD-10-CM | POA: Diagnosis not present

## 2017-08-15 DIAGNOSIS — G9340 Encephalopathy, unspecified: Secondary | ICD-10-CM

## 2017-08-15 DIAGNOSIS — J441 Chronic obstructive pulmonary disease with (acute) exacerbation: Secondary | ICD-10-CM | POA: Diagnosis present

## 2017-08-15 DIAGNOSIS — G9341 Metabolic encephalopathy: Secondary | ICD-10-CM | POA: Diagnosis not present

## 2017-08-15 DIAGNOSIS — E872 Acidosis, unspecified: Secondary | ICD-10-CM

## 2017-08-15 DIAGNOSIS — K922 Gastrointestinal hemorrhage, unspecified: Secondary | ICD-10-CM | POA: Diagnosis present

## 2017-08-15 DIAGNOSIS — J189 Pneumonia, unspecified organism: Secondary | ICD-10-CM

## 2017-08-15 DIAGNOSIS — Z91041 Radiographic dye allergy status: Secondary | ICD-10-CM

## 2017-08-15 DIAGNOSIS — I110 Hypertensive heart disease with heart failure: Secondary | ICD-10-CM | POA: Diagnosis present

## 2017-08-15 DIAGNOSIS — Z7902 Long term (current) use of antithrombotics/antiplatelets: Secondary | ICD-10-CM

## 2017-08-15 DIAGNOSIS — I499 Cardiac arrhythmia, unspecified: Secondary | ICD-10-CM | POA: Diagnosis present

## 2017-08-15 DIAGNOSIS — Z881 Allergy status to other antibiotic agents status: Secondary | ICD-10-CM | POA: Diagnosis not present

## 2017-08-15 DIAGNOSIS — Y95 Nosocomial condition: Secondary | ICD-10-CM | POA: Diagnosis present

## 2017-08-15 DIAGNOSIS — D649 Anemia, unspecified: Secondary | ICD-10-CM | POA: Diagnosis not present

## 2017-08-15 DIAGNOSIS — F17210 Nicotine dependence, cigarettes, uncomplicated: Secondary | ICD-10-CM | POA: Diagnosis present

## 2017-08-15 DIAGNOSIS — J9600 Acute respiratory failure, unspecified whether with hypoxia or hypercapnia: Secondary | ICD-10-CM

## 2017-08-15 DIAGNOSIS — R609 Edema, unspecified: Secondary | ICD-10-CM

## 2017-08-15 DIAGNOSIS — R0602 Shortness of breath: Secondary | ICD-10-CM

## 2017-08-15 DIAGNOSIS — X58XXXA Exposure to other specified factors, initial encounter: Secondary | ICD-10-CM | POA: Diagnosis not present

## 2017-08-15 DIAGNOSIS — L8990 Pressure ulcer of unspecified site, unspecified stage: Secondary | ICD-10-CM | POA: Diagnosis not present

## 2017-08-15 DIAGNOSIS — I469 Cardiac arrest, cause unspecified: Secondary | ICD-10-CM | POA: Diagnosis not present

## 2017-08-15 DIAGNOSIS — R569 Unspecified convulsions: Secondary | ICD-10-CM | POA: Diagnosis present

## 2017-08-15 DIAGNOSIS — Z515 Encounter for palliative care: Secondary | ICD-10-CM

## 2017-08-15 DIAGNOSIS — J440 Chronic obstructive pulmonary disease with acute lower respiratory infection: Principal | ICD-10-CM | POA: Diagnosis present

## 2017-08-15 DIAGNOSIS — J9601 Acute respiratory failure with hypoxia: Secondary | ICD-10-CM

## 2017-08-15 DIAGNOSIS — Z95828 Presence of other vascular implants and grafts: Secondary | ICD-10-CM

## 2017-08-15 DIAGNOSIS — E1151 Type 2 diabetes mellitus with diabetic peripheral angiopathy without gangrene: Secondary | ICD-10-CM | POA: Diagnosis present

## 2017-08-15 DIAGNOSIS — Z89611 Acquired absence of right leg above knee: Secondary | ICD-10-CM

## 2017-08-15 DIAGNOSIS — Z452 Encounter for adjustment and management of vascular access device: Secondary | ICD-10-CM

## 2017-08-15 DIAGNOSIS — Z7982 Long term (current) use of aspirin: Secondary | ICD-10-CM | POA: Diagnosis not present

## 2017-08-15 DIAGNOSIS — J9602 Acute respiratory failure with hypercapnia: Secondary | ICD-10-CM | POA: Diagnosis not present

## 2017-08-15 DIAGNOSIS — I340 Nonrheumatic mitral (valve) insufficiency: Secondary | ICD-10-CM

## 2017-08-15 DIAGNOSIS — J9621 Acute and chronic respiratory failure with hypoxia: Secondary | ICD-10-CM | POA: Diagnosis present

## 2017-08-15 DIAGNOSIS — Z0189 Encounter for other specified special examinations: Secondary | ICD-10-CM

## 2017-08-15 HISTORY — DX: Essential (primary) hypertension: I10

## 2017-08-15 HISTORY — DX: Disorientation, unspecified: R41.0

## 2017-08-15 HISTORY — DX: Peripheral vascular disease, unspecified: I73.9

## 2017-08-15 LAB — PHOSPHORUS: Phosphorus: 3.8 mg/dL (ref 2.5–4.6)

## 2017-08-15 LAB — BLOOD GAS, ARTERIAL
ACID-BASE DEFICIT: 3.3 mmol/L — AB (ref 0.0–2.0)
Acid-base deficit: 4.2 mmol/L — ABNORMAL HIGH (ref 0.0–2.0)
Bicarbonate: 21 mmol/L (ref 20.0–28.0)
Bicarbonate: 23.1 mmol/L (ref 20.0–28.0)
FIO2: 1
FIO2: 0.4
O2 SAT: 99.5 %
O2 Saturation: 83.8 %
PATIENT TEMPERATURE: 37
PCO2 ART: 38 mmHg (ref 32.0–48.0)
PH ART: 7.35 (ref 7.350–7.450)
PO2 ART: 176 mmHg — AB (ref 83.0–108.0)
PO2 ART: 54 mmHg — AB (ref 83.0–108.0)
Patient temperature: 37
RATE: 16 resp/min
VT: 500 mL
pCO2 arterial: 47 mmHg (ref 32.0–48.0)
pH, Arterial: 7.3 — ABNORMAL LOW (ref 7.350–7.450)

## 2017-08-15 LAB — BASIC METABOLIC PANEL
Anion gap: 8 (ref 5–15)
BUN: 20 mg/dL (ref 6–20)
CALCIUM: 8.6 mg/dL — AB (ref 8.9–10.3)
CO2: 20 mmol/L — AB (ref 22–32)
CREATININE: 1.06 mg/dL (ref 0.61–1.24)
Chloride: 109 mmol/L (ref 101–111)
GFR calc Af Amer: >60 mL/min (ref >60)
GFR calc non Af Amer: >60 mL/min (ref >60)
Glucose, Bld: 190 mg/dL — ABNORMAL HIGH (ref 65–99)
Potassium: 5.2 mmol/L — ABNORMAL HIGH (ref 3.5–5.1)
SODIUM: 137 mmol/L (ref 135–145)

## 2017-08-15 LAB — GLUCOSE, CAPILLARY
GLUCOSE-CAPILLARY: 206 mg/dL — AB (ref 65–99)
GLUCOSE-CAPILLARY: 228 mg/dL — AB (ref 65–99)
Glucose-Capillary: 171 mg/dL — ABNORMAL HIGH (ref 65–99)
Glucose-Capillary: 231 mg/dL — ABNORMAL HIGH (ref 65–99)
Glucose-Capillary: 187 mg/dL — ABNORMAL HIGH (ref 65–99)

## 2017-08-15 LAB — CBC WITH DIFFERENTIAL/PLATELET
BASOS ABS: 0.1 10*3/uL (ref 0–0.1)
BASOS PCT: 1 %
BASOS PCT: 0 %
Basophils Absolute: 0.1 10*3/uL (ref 0–0.1)
EOS ABS: 0 10*3/uL (ref 0–0.7)
EOS PCT: 3 %
Eosinophils Absolute: 0.3 10*3/uL (ref 0–0.7)
Eosinophils Relative: 0 %
HEMATOCRIT: 15.6 % — AB (ref 40.0–52.0)
HEMATOCRIT: 17.5 % — AB (ref 40.0–52.0)
HEMOGLOBIN: 5.4 g/dL — AB (ref 13.0–18.0)
Hemoglobin: 4.6 g/dL — CL (ref 13.0–18.0)
LYMPHS ABS: 1 10*3/uL (ref 1.0–3.6)
LYMPHS PCT: 15 %
Lymphocytes Relative: 7 %
Lymphs Abs: 1.5 10*3/uL (ref 1.0–3.6)
MCH: 22.8 pg — ABNORMAL LOW (ref 26.0–34.0)
MCH: 24 pg — AB (ref 26.0–34.0)
MCHC: 29.6 g/dL — ABNORMAL LOW (ref 32.0–36.0)
MCHC: 31.1 g/dL — ABNORMAL LOW (ref 32.0–36.0)
MCV: 76.9 fL — AB (ref 80.0–100.0)
MCV: 77.1 fL — ABNORMAL LOW (ref 80.0–100.0)
MONO ABS: 0.2 10*3/uL (ref 0.2–1.0)
Monocytes Absolute: 0.9 10*3/uL (ref 0.2–1.0)
Monocytes Relative: 9 %
Monocytes Relative: 1 %
NEUTROS ABS: 7.1 10*3/uL — AB (ref 1.4–6.5)
NEUTROS ABS: 13.8 10*3/uL — AB (ref 1.4–6.5)
NEUTROS PCT: 92 %
Neutrophils Relative %: 72 %
Platelets: 590 10*3/uL — ABNORMAL HIGH (ref 150–440)
Platelets: 553 10*3/uL — ABNORMAL HIGH (ref 150–440)
RBC: 2.03 MIL/uL — AB (ref 4.40–5.90)
RBC: 2.26 MIL/uL — ABNORMAL LOW (ref 4.40–5.90)
RDW: 21.6 % — AB (ref 11.5–14.5)
RDW: 21.2 % — AB (ref 11.5–14.5)
WBC: 9.9 10*3/uL (ref 3.8–10.6)
WBC: 15 10*3/uL — ABNORMAL HIGH (ref 3.8–10.6)

## 2017-08-15 LAB — COMPREHENSIVE METABOLIC PANEL
ALT: 5 U/L — ABNORMAL LOW (ref 17–63)
ANION GAP: 10 (ref 5–15)
AST: 19 U/L (ref 15–41)
Albumin: 3.1 g/dL — ABNORMAL LOW (ref 3.5–5.0)
Alkaline Phosphatase: 57 U/L (ref 38–126)
BUN: 17 mg/dL (ref 6–20)
CHLORIDE: 108 mmol/L (ref 101–111)
CO2: 19 mmol/L — AB (ref 22–32)
Calcium: 8.5 mg/dL — ABNORMAL LOW (ref 8.9–10.3)
Creatinine, Ser: 1.11 mg/dL (ref 0.61–1.24)
Glucose, Bld: 208 mg/dL — ABNORMAL HIGH (ref 65–99)
Potassium: 4.9 mmol/L (ref 3.5–5.1)
SODIUM: 137 mmol/L (ref 135–145)
Total Bilirubin: 0.2 mg/dL — ABNORMAL LOW (ref 0.3–1.2)
Total Protein: 6.8 g/dL (ref 6.5–8.1)

## 2017-08-15 LAB — ECHOCARDIOGRAM COMPLETE
Height: 69 in
Weight: 2787.2 oz

## 2017-08-15 LAB — BLOOD GAS, VENOUS
Acid-base deficit: 6.7 mmol/L — ABNORMAL HIGH (ref 0.0–2.0)
Bicarbonate: 19.2 mmol/L — ABNORMAL LOW (ref 20.0–28.0)
O2 SAT: 55.9 %
PATIENT TEMPERATURE: 37
PO2 VEN: 33 mmHg (ref 32.0–45.0)
pCO2, Ven: 39 mmHg — ABNORMAL LOW (ref 44.0–60.0)
pH, Ven: 7.3 (ref 7.250–7.430)

## 2017-08-15 LAB — PROTIME-INR
INR: 1.29
PROTHROMBIN TIME: 16 s — AB (ref 11.4–15.2)

## 2017-08-15 LAB — LACTIC ACID, PLASMA
LACTIC ACID, VENOUS: 3.6 mmol/L — AB (ref 0.5–1.9)
LACTIC ACID, VENOUS: 2.7 mmol/L — AB (ref 0.5–1.9)

## 2017-08-15 LAB — TRIGLYCERIDES: Triglycerides: 65 mg/dL (ref <150)

## 2017-08-15 LAB — HEMOGLOBIN AND HEMATOCRIT, BLOOD
HEMATOCRIT: 22.3 % — AB (ref 40.0–52.0)
HEMOGLOBIN: 6.9 g/dL — AB (ref 13.0–18.0)

## 2017-08-15 LAB — MRSA PCR SCREENING: MRSA by PCR: POSITIVE — AB

## 2017-08-15 LAB — TROPONIN I
Troponin I: <0.03 ng/mL (ref <0.03)
Troponin I: 0.08 ng/mL (ref <0.03)

## 2017-08-15 LAB — PROCALCITONIN: Procalcitonin: <0.1 ng/mL

## 2017-08-15 LAB — PREPARE RBC (CROSSMATCH)

## 2017-08-15 LAB — ABO/RH: ABO/RH(D): O POS

## 2017-08-15 LAB — MAGNESIUM: Magnesium: 1.8 mg/dL (ref 1.7–2.4)

## 2017-08-15 LAB — POTASSIUM: Potassium: 5.4 mmol/L — ABNORMAL HIGH (ref 3.5–5.1)

## 2017-08-15 MED ORDER — MIDAZOLAM HCL 2 MG/2ML IJ SOLN
2.0000 mg | INTRAMUSCULAR | Status: DC | PRN
  Administered 2017-08-16: 2 mg via INTRAVENOUS
  Filled 2017-08-15 (×2): qty 2

## 2017-08-15 MED ORDER — BUDESONIDE 0.25 MG/2ML IN SUSP
0.2500 mg | Freq: Two times a day (BID) | RESPIRATORY_TRACT | Status: DC
  Administered 2017-08-15 – 2017-08-22 (×15): 0.25 mg via RESPIRATORY_TRACT
  Filled 2017-08-15 (×15): qty 2

## 2017-08-15 MED ORDER — FUROSEMIDE 10 MG/ML IJ SOLN
40.0000 mg | Freq: Once | INTRAMUSCULAR | Status: AC
  Administered 2017-08-15: 40 mg via INTRAVENOUS
  Filled 2017-08-15: qty 4

## 2017-08-15 MED ORDER — SODIUM CHLORIDE 0.9 % IV BOLUS (SEPSIS)
1000.0000 mL | Freq: Once | INTRAVENOUS | Status: AC
  Administered 2017-08-15: 1000 mL via INTRAVENOUS

## 2017-08-15 MED ORDER — LEVOFLOXACIN IN D5W 750 MG/150ML IV SOLN
750.0000 mg | INTRAVENOUS | Status: DC
  Administered 2017-08-16 – 2017-08-18 (×4): 750 mg via INTRAVENOUS
  Filled 2017-08-15 (×7): qty 150

## 2017-08-15 MED ORDER — ATROPINE SULFATE 1 MG/10ML IJ SOSY
PREFILLED_SYRINGE | INTRAMUSCULAR | Status: AC
  Administered 2017-08-15: 14:00:00
  Filled 2017-08-15: qty 10

## 2017-08-15 MED ORDER — PIPERACILLIN-TAZOBACTAM 3.375 G IVPB 30 MIN
INTRAVENOUS | Status: AC
  Administered 2017-08-15: 3.375 g via INTRAVENOUS
  Filled 2017-08-15: qty 50

## 2017-08-15 MED ORDER — LORAZEPAM 2 MG/ML IJ SOLN: 1.0000 mg | INTRAMUSCULAR | Status: DC | PRN

## 2017-08-15 MED ORDER — MIDAZOLAM HCL 2 MG/2ML IJ SOLN
2.0000 mg | INTRAMUSCULAR | Status: DC | PRN
  Administered 2017-08-15: 2 mg via INTRAVENOUS
  Filled 2017-08-15 (×2): qty 2

## 2017-08-15 MED ORDER — SODIUM CHLORIDE 0.9 % IV SOLN
100.0000 mg | Freq: Two times a day (BID) | INTRAVENOUS | Status: DC
  Administered 2017-08-16 – 2017-08-19 (×7): 100 mg via INTRAVENOUS
  Filled 2017-08-15 (×12): qty 10

## 2017-08-15 MED ORDER — METHYLPREDNISOLONE SODIUM SUCC 40 MG IJ SOLR
40.0000 mg | Freq: Two times a day (BID) | INTRAMUSCULAR | Status: DC
  Administered 2017-08-15 – 2017-08-19 (×8): 40 mg via INTRAVENOUS
  Filled 2017-08-15 (×9): qty 1

## 2017-08-15 MED ORDER — ONDANSETRON HCL 4 MG PO TABS: 4.0000 mg | ORAL_TABLET | Freq: Four times a day (QID) | ORAL | Status: DC | PRN

## 2017-08-15 MED ORDER — IPRATROPIUM-ALBUTEROL 0.5-2.5 (3) MG/3ML IN SOLN: 3.0000 mL | Freq: Four times a day (QID) | RESPIRATORY_TRACT | Status: DC

## 2017-08-15 MED ORDER — MIDAZOLAM HCL 5 MG/ML IJ SOLN
4.0000 mg | Freq: Once | INTRAMUSCULAR | Status: AC
  Administered 2017-08-15: 4 mg via INTRAVENOUS
  Filled 2017-08-15: qty 1

## 2017-08-15 MED ORDER — METHYLPREDNISOLONE SODIUM SUCC 125 MG IJ SOLR
INTRAMUSCULAR | Status: AC
  Filled 2017-08-15: qty 2

## 2017-08-15 MED ORDER — FUROSEMIDE 10 MG/ML IJ SOLN
40.0000 mg | Freq: Once | INTRAMUSCULAR | Status: AC
  Administered 2017-08-15: 40 mg via INTRAVENOUS

## 2017-08-15 MED ORDER — MORPHINE SULFATE (PF) 2 MG/ML IV SOLN
2.0000 mg | Freq: Once | INTRAVENOUS | Status: AC
  Administered 2017-08-15: 2 mg via INTRAVENOUS

## 2017-08-15 MED ORDER — ORAL CARE MOUTH RINSE
15.0000 mL | OROMUCOSAL | Status: DC
  Administered 2017-08-15 – 2017-08-19 (×28): 15 mL via OROMUCOSAL

## 2017-08-15 MED ORDER — ACETAMINOPHEN 650 MG RE SUPP: 650.0000 mg | Freq: Four times a day (QID) | RECTAL | Status: DC | PRN

## 2017-08-15 MED ORDER — IPRATROPIUM-ALBUTEROL 0.5-2.5 (3) MG/3ML IN SOLN
3.0000 mL | Freq: Once | RESPIRATORY_TRACT | Status: AC
  Administered 2017-08-15: 3 mL via RESPIRATORY_TRACT
  Filled 2017-08-15: qty 3

## 2017-08-15 MED ORDER — FENTANYL BOLUS VIA INFUSION
25.0000 ug | INTRAVENOUS | Status: DC | PRN
  Filled 2017-08-15: qty 25

## 2017-08-15 MED ORDER — MORPHINE SULFATE (PF) 2 MG/ML IV SOLN
INTRAVENOUS | Status: AC
  Administered 2017-08-15: 2 mg via INTRAVENOUS
  Filled 2017-08-15: qty 1

## 2017-08-15 MED ORDER — SODIUM CHLORIDE 0.9% FLUSH
3.0000 mL | Freq: Two times a day (BID) | INTRAVENOUS | Status: DC
  Administered 2017-08-15 – 2017-08-22 (×14): 3 mL via INTRAVENOUS

## 2017-08-15 MED ORDER — ONDANSETRON HCL 4 MG/2ML IJ SOLN: 4.0000 mg | Freq: Four times a day (QID) | INTRAMUSCULAR | Status: DC | PRN

## 2017-08-15 MED ORDER — FAMOTIDINE IN NACL 20-0.9 MG/50ML-% IV SOLN
20.0000 mg | INTRAVENOUS | Status: DC
  Administered 2017-08-17 – 2017-08-18 (×3): 20 mg via INTRAVENOUS
  Filled 2017-08-15 (×5): qty 50

## 2017-08-15 MED ORDER — INSULIN ASPART 100 UNIT/ML ~~LOC~~ SOLN
0.0000 [IU] | SUBCUTANEOUS | Status: DC
  Administered 2017-08-15: 5 [IU] via SUBCUTANEOUS
  Administered 2017-08-15: 3 [IU] via SUBCUTANEOUS
  Administered 2017-08-16 – 2017-08-17 (×8): 2 [IU] via SUBCUTANEOUS
  Administered 2017-08-17: 3 [IU] via SUBCUTANEOUS
  Administered 2017-08-17 (×2): 2 [IU] via SUBCUTANEOUS
  Administered 2017-08-18 (×3): 3 [IU] via SUBCUTANEOUS
  Administered 2017-08-18: 2 [IU] via SUBCUTANEOUS
  Administered 2017-08-18 – 2017-08-19 (×3): 3 [IU] via SUBCUTANEOUS
  Administered 2017-08-19 – 2017-08-20 (×6): 2 [IU] via SUBCUTANEOUS
  Administered 2017-08-20: 5 [IU] via SUBCUTANEOUS
  Administered 2017-08-21: 21:00:00 11 [IU] via SUBCUTANEOUS
  Administered 2017-08-21 (×2): 5 [IU] via SUBCUTANEOUS
  Administered 2017-08-22: 11 [IU] via SUBCUTANEOUS
  Administered 2017-08-22 (×2): 2 [IU] via SUBCUTANEOUS
  Administered 2017-08-22: 3 [IU] via SUBCUTANEOUS
  Filled 2017-08-15 (×34): qty 1

## 2017-08-15 MED ORDER — CHLORHEXIDINE GLUCONATE 0.12% ORAL RINSE (MEDLINE KIT)
15.0000 mL | Freq: Two times a day (BID) | OROMUCOSAL | Status: DC
  Administered 2017-08-15 – 2017-08-18 (×6): 15 mL via OROMUCOSAL

## 2017-08-15 MED ORDER — PIPERACILLIN-TAZOBACTAM 3.375 G IVPB 30 MIN
3.3750 g | Freq: Once | INTRAVENOUS | Status: AC
  Administered 2017-08-15: 3.375 g via INTRAVENOUS

## 2017-08-15 MED ORDER — VANCOMYCIN HCL IN DEXTROSE 1-5 GM/200ML-% IV SOLN
1000.0000 mg | Freq: Once | INTRAVENOUS | Status: AC
  Administered 2017-08-15: 1000 mg via INTRAVENOUS
  Filled 2017-08-15: qty 200

## 2017-08-15 MED ORDER — OXYCODONE HCL 5 MG PO TABS: 5.0000 mg | ORAL_TABLET | ORAL | Status: DC | PRN

## 2017-08-15 MED ORDER — SODIUM CHLORIDE 0.9 % IV SOLN
10.0000 mL/h | Freq: Once | INTRAVENOUS | Status: AC
  Administered 2017-08-15: 10 mL/h via INTRAVENOUS

## 2017-08-15 MED ORDER — POLYETHYLENE GLYCOL 3350 17 G PO PACK: 17.0000 g | PACK | Freq: Every day | ORAL | Status: DC | PRN

## 2017-08-15 MED ORDER — IPRATROPIUM-ALBUTEROL 0.5-2.5 (3) MG/3ML IN SOLN
3.0000 mL | RESPIRATORY_TRACT | Status: DC
  Administered 2017-08-15 – 2017-08-17 (×10): 3 mL via RESPIRATORY_TRACT
  Filled 2017-08-15 (×10): qty 3

## 2017-08-15 MED ORDER — METHYLPREDNISOLONE SODIUM SUCC 125 MG IJ SOLR
60.0000 mg | INTRAMUSCULAR | Status: DC
  Administered 2017-08-15: 60 mg via INTRAVENOUS

## 2017-08-15 MED ORDER — IPRATROPIUM-ALBUTEROL 0.5-2.5 (3) MG/3ML IN SOLN
3.0000 mL | Freq: Four times a day (QID) | RESPIRATORY_TRACT | Status: DC
  Administered 2017-08-15: 3 mL via RESPIRATORY_TRACT
  Filled 2017-08-15: qty 3

## 2017-08-15 MED ORDER — PANTOPRAZOLE SODIUM 40 MG IV SOLR
40.0000 mg | Freq: Two times a day (BID) | INTRAVENOUS | Status: DC
  Administered 2017-08-15 – 2017-08-19 (×8): 40 mg via INTRAVENOUS
  Filled 2017-08-15 (×8): qty 40

## 2017-08-15 MED ORDER — ALBUTEROL SULFATE (2.5 MG/3ML) 0.083% IN NEBU: 2.5000 mg | INHALATION_SOLUTION | RESPIRATORY_TRACT | Status: DC | PRN

## 2017-08-15 MED ORDER — PROPOFOL 1000 MG/100ML IV EMUL
5.0000 ug/kg/min | INTRAVENOUS | Status: DC
  Administered 2017-08-15: 20 ug/kg/min via INTRAVENOUS
  Administered 2017-08-15: 10 ug/kg/min via INTRAVENOUS
  Administered 2017-08-16 – 2017-08-18 (×5): 30 ug/kg/min via INTRAVENOUS
  Filled 2017-08-15 (×9): qty 100

## 2017-08-15 MED ORDER — SODIUM CHLORIDE 0.9 % IV SOLN
Freq: Once | INTRAVENOUS | Status: AC
  Administered 2017-08-15: 14:00:00 via INTRAVENOUS

## 2017-08-15 MED ORDER — SODIUM CHLORIDE 0.9 % IV SOLN
200.0000 mg | Freq: Once | INTRAVENOUS | Status: AC
  Administered 2017-08-15: 200 mg via INTRAVENOUS
  Filled 2017-08-15: qty 20

## 2017-08-15 MED ORDER — ACETAMINOPHEN 325 MG PO TABS: 650.0000 mg | ORAL_TABLET | Freq: Four times a day (QID) | ORAL | Status: DC | PRN

## 2017-08-15 MED ORDER — FENTANYL 2500MCG IN NS 250ML (10MCG/ML) PREMIX INFUSION
25.0000 ug/h | INTRAVENOUS | Status: DC
  Administered 2017-08-15: 50 ug/h via INTRAVENOUS
  Administered 2017-08-16 – 2017-08-17 (×2): 225 ug/h via INTRAVENOUS
  Administered 2017-08-18: 200 ug/h via INTRAVENOUS
  Filled 2017-08-15 (×6): qty 250

## 2017-08-15 MED ORDER — PANTOPRAZOLE SODIUM 40 MG IV SOLR
40.0000 mg | Freq: Once | INTRAVENOUS | Status: AC
  Administered 2017-08-15: 40 mg via INTRAVENOUS
  Filled 2017-08-15: qty 40

## 2017-08-15 MED ORDER — SODIUM CHLORIDE 0.9 % IV SOLN
1000.0000 mg | Freq: Once | INTRAVENOUS | Status: DC
  Administered 2017-08-15: 1000 mg via INTRAVENOUS
  Filled 2017-08-15: qty 10

## 2017-08-15 MED ORDER — FENTANYL CITRATE (PF) 100 MCG/2ML IJ SOLN
50.0000 ug | Freq: Once | INTRAMUSCULAR | Status: AC
  Administered 2017-08-15: 50 ug via INTRAVENOUS

## 2017-08-15 NOTE — ED Triage Notes (Addendum)
Pt to [facility] via EMS from [facility], report called out for pt unresponsive w/ agonal respirations, rescue breaths administered, pt began breathing again on his own.  Pt alert and oriented upon arrival in NAD, denies pain.  Pt on NRB by EMS, 100% o2sat.  EMS report pt was in ED in June dx with sepsis [facility], unsure of source of infection

## 2017-08-15 NOTE — Progress Notes (Signed)
*  PRELIMINARY RESULTS* Echocardiogram 2D Echocardiogram has been performed.  Cristela BlueHege, Usama Harkless 08/15/2017, 2:57 PM

## 2017-08-15 NOTE — Progress Notes (Signed)
[facility] [place] [facility]   PATIENT NAME: Blake Villarreal    MR#:  [id number]  DATE OF BIRTH:  11/01/1946  SUBJECTIVE:  CHIEF COMPLAINT:   Chief Complaint  Patient presents with  . Shortness of Breath    Code blue called on the floor. I was on the floor and was able to see the patient within a few seconds.  Patient had contracted upper extremities and apneic. No drooling/blood in mouth. In 2-3 seconds the rigidity resolved and patient started to breath at 3-4 times a minute. Pulse strong.  Saturations initially were 83% on 2 L oxygen but improved to 93% quickly. Blood pressure 108/54 with heart rate 45. Blood glucose 171  REVIEW OF SYSTEMS:    Review of Systems  Unable to perform ROS: Mental status change    DRUG ALLERGIES:   Allergies  Allergen Reactions  . Daptomycin   . Iodinated Diagnostic Agents     VITALS:  Blood pressure (!) 108/54, pulse (!) 44, temperature 98 F (36.7 C), temperature source Oral, resp. rate 16, height 5\' 9"  (1.753 m), weight 79 kg (174 lb 3.2 oz), SpO2 100 %.  PHYSICAL EXAMINATION:   Physical Exam  GENERAL:  71 y.o.-year-old patient lying in the bed EYES: Pupils equal, round, reactive to light  HEENT: Head atraumatic, normocephalic. Oropharynx and nasopharynx clear.  LUNGS: Coarse breath sounds with wheezing CARDIOVASCULAR: S1, S2  ABDOMEN: Soft, nontender, nondistended.  EXTREMITIES: No cyanosis. Right below-knee amputation NEUROLOGIC: Rigidity in upper and lower extremities PSYCHIATRIC: Drowzy  LABORATORY PANEL:   CBC  Recent Labs Lab 08/15/17 0536  WBC 9.9  HGB 4.6*  HCT 15.6*  PLT 590*   ------------------------------------------------------------------------------------------------------------------ Chemistries   Recent Labs Lab 08/15/17 0536  NA 137  K 4.9  CL 108  CO2 19*  GLUCOSE 208*  BUN 17  CREATININE 1.11  CALCIUM 8.5*  AST 19  ALT 5*  ALKPHOS 57  BILITOT 0.2*    ------------------------------------------------------------------------------------------------------------------  Cardiac Enzymes No results for input(s): TROPONINI in the last 168 hours. ------------------------------------------------------------------------------------------------------------------  RADIOLOGY:  Dg Chest Portable 1 View  Result Date: 08/15/2017 CLINICAL DATA:  Diagonal respirations.  Sepsis. EXAM: PORTABLE CHEST 1 VIEW COMPARISON:  05/30/2017 FINDINGS: Patchy airspace opacities in the central and basilar left lung could represent pneumonia. Probable small left pleural effusion. Right lung is clear. Pulmonary vasculature is normal. Unchanged cardiomegaly. IMPRESSION: Patchy opacities in the central and basilar left lung, with a small left pleural effusion. This could represent pneumonia. Electronically Signed   By: Ellery Plunkaniel R Mitchell M.D.   On: 08/15/2017 06:08     ASSESSMENT AND PLAN:   * New onset seizure Patient is postictal at this time. We will give him 1 dose of Keppra. Consult neurology. Stat CT scan of the head. Seizure precautions. Transfer to stepdown unit. Patient likely had an episode of seizure at the nursing home when he stopped breathing. Check EEG. Consult neurology. Discussed with Dr. Belia HemanKasa of ICU.  * GI bleed with acute blood loss anemia. Patient was receiving 1 unit of blood. Held.  * Acute COPD exacerbation. On steroids and scheduled nebulizers. Acute hypoxic respiratory failure  All the records are reviewed and case discussed with Care Management/Social Worker Management plans discussed with the patient, family and they are in agreement.  CODE STATUS: FULL CODE  DVT Prophylaxis: SCDs  TOTAL CC TIME TAKING CARE OF THIS PATIENT: 35 minutes.   Milagros LollSudini, Jeweliana Dudgeon R M.D on 08/15/2017 at 11:41 AM  Between 7am to  6pm - Pager - [phone]  After 6pm go to [email or web address] - password [email or web address] [facility]  [facility] [place] [facility]  Office   [phone]  CC: Primary care physician; Keane Police, MD  Note: This dictation was prepared with Dragon dictation along with smaller phrase technology. Any transcriptional errors that result from this process are unintentional.

## 2017-08-15 NOTE — Progress Notes (Signed)
I was in room with patient asking questions to complete his admission profile when patient said he was having trouble breathing, began to check vitals and called respiratory therapist into room patient suddenly contracted upper extremities and became apneic, code blue called.

## 2017-08-15 NOTE — Progress Notes (Signed)
Chaplain responded to a Code Blue to visit with pt in room 123. No family was available. Chaplain provided [employer] and a pastoral presence.    08/15/17 1100  Clinical Encounter Type  Visited With Patient  Visit Type Initial;Spiritual support  Referral From Nurse  Consult/Referral To Chaplain  Spiritual Encounters  Spiritual Needs Prayer

## 2017-08-15 NOTE — Progress Notes (Signed)
Silver colored metal ring with rectangluar black stone given to Pts sister and POA Theresa.  She was told that Issac the patients brother has his chain.

## 2017-08-15 NOTE — Procedures (Signed)
Central Venous Catheter Placement: Indication: Patient receiving vesicant or irritant drug.; Patient receiving intravenous therapy for longer than 5 days.; Patient has limited or no vascular access.   Consent:emergent    Hand washing performed prior to starting the procedure.   Procedure:   An active timeout was performed and correct patient, name, & ID confirmed.   Patient was positioned correctly for central venous access.  Patient was prepped using strict sterile technique including chlorohexadine preps, sterile drape, sterile gown and sterile gloves.    The area was prepped, draped and anesthetized in the usual sterile manner. Patient comfort was obtained.    A triple lumen catheter was placed in LEFT  Internal Jugular Vein There was good blood return, catheter caps were placed on lumens, catheter flushed easily, the line was secured and a sterile dressing and BIO-PATCH applied.   Ultrasound was used to visualize vasculature and guidance of needle.   Number of Attempts: 1 Complications:none Estimated Blood Loss: none Chest Radiograph indicated and ordered.  Operator: Kamar Callender.   Manette Doto David Gorje Iyer, M.D.  [facility]  Medical Director ICU-[facility] Medical Director [facility] Cardio-Pulmonary Department     

## 2017-08-15 NOTE — ED Provider Notes (Signed)
[facility] Emergency Department Provider Note   ____________________________________________   First MD Initiated Contact with Patient 08/15/17 0533     (approximate)  I have reviewed the triage vital signs and the nursing notes.   HISTORY  Chief Complaint Shortness of Breath  The patient has some alteration of his mental status and is unable to answer questions well  HPI Blake Villarreal is a 71 y.o. male who comes into the hospital today from a nursing home. According to EMS they were called after CPR. When the fire department arrived they report that the patientwas breathing about 3 times a minute. The patient was bagged by EMS when they arrived and he started spontaneously breathing. They were unable to get a room air saturation but he was 100% on nonrebreather. The patient states that the shortness of breath started today but he couldn't be more specific. He denies any chest pain, abdominal pain, vomiting, cough. The patient was admitted to the hospital for sepsis in June.   Past Medical History:  Diagnosis Date  . Diabetes mellitus without complication (HCC)   . Hypertension   . PAD (peripheral artery disease) (HCC)     There are no active problems to display for this patient.   Past Surgical History:  Procedure Laterality Date  . ABOVE KNEE LEG AMPUTATION      Prior to Admission medications   Medication Sig Start Date End Date Taking? Authorizing Provider  acetaminophen (TYLENOL) 325 MG tablet Take 650 mg by mouth every 6 (six) hours as needed.    [provider]  albuterol (PROVENTIL) (2.5 MG/3ML) 0.083% nebulizer solution Take 2 ampules by nebulization every 6 (six) hours as needed for wheezing.    [provider]  amLODipine (NORVASC) 10 MG tablet Take 10 mg by mouth daily.    [provider]  atorvastatin (LIPITOR) 20 MG tablet Take 20 mg by mouth daily.    [provider]  Cholecalciferol (VITAMIN  D3) 2000 units TABS Take 1 tablet by mouth daily.    [provider]  docusate sodium (COLACE) 100 MG capsule Take 100 mg by mouth daily.    [provider]  doxycycline (DORYX) 100 MG EC tablet Take 100 mg by mouth 2 (two) times daily.    [provider]  ferrous sulfate 325 (65 FE) MG EC tablet Take 325 mg by mouth 2 (two) times daily.    [provider]  gabapentin (NEURONTIN) 100 MG capsule Take 100 mg by mouth 3 (three) times daily.    [provider]  hydrocerin (EUCERIN) CREA Apply 1 application topically 2 (two) times daily.    [provider]  HYDROcodone-acetaminophen (NORCO/VICODIN) 5-325 MG tablet Take 1 tablet by mouth every 6 (six) hours as needed for moderate pain.    [provider]  insulin glargine (LANTUS) 100 UNIT/ML injection Inject 10 Units into the skin at bedtime.    [provider]  lisinopril (PRINIVIL,ZESTRIL) 40 MG tablet Take 40 mg by mouth daily.    [provider]  magnesium oxide (MAG-OX) 400 MG tablet Take 17 mg by mouth daily.    [provider]  metFORMIN (GLUCOPHAGE) 500 MG tablet Take 500 mg by mouth 2 (two) times daily with a meal.    [provider]  mirtazapine (REMERON) 15 MG tablet Take 7.5 mg by mouth at bedtime.    [provider]  omeprazole (PRILOSEC) 20 MG capsule Take 20 mg by mouth daily.  [provider]  risperiDONE (RISPERDAL) 0.25 MG tablet Take 0.25 mg by mouth at bedtime.    [provider]  terazosin (HYTRIN) 5 MG capsule Take 5 mg by mouth at bedtime.    [provider]  vitamin B-12 (CYANOCOBALAMIN) 1000 MCG tablet Take 1,000 mcg by mouth daily.    [provider]    Allergies Daptomycin and Iodinated diagnostic agents  History reviewed. No pertinent family history.  Social History Social History  Substance Use Topics  . Smoking status: Current Every Day Smoker  . Smokeless tobacco: Never  Used  . Alcohol use No    Review of Systems  Constitutional: No fever/chills Eyes: No visual changes. ENT: No sore throat. Cardiovascular: Denies chest pain. Respiratory:  shortness of breath. Gastrointestinal: No abdominal pain.  No nausea, no vomiting.  No diarrhea.  No constipation. Genitourinary: Negative for dysuria. Musculoskeletal: Negative for back pain. Skin: Negative for rash. Neurological: Negative for headaches, focal weakness or numbness.   ____________________________________________   PHYSICAL EXAM:  VITAL SIGNS: ED Triage Vitals  Enc Vitals Group     BP 08/15/17 0534 (!) 121/49     Pulse Rate 08/15/17 0534 63     Resp 08/15/17 0534 19     Temp 08/15/17 0534 (!) 97.1 F (36.2 C)     Temp src --      SpO2 08/15/17 0534 100 %     Weight 08/15/17 0536 170 lb (77.1 kg)     Height 08/15/17 0536 5\' 9"  (1.753 m)     Head Circumference --      Peak Flow --      Pain Score --      Pain Loc --      Pain Edu? --      Excl. in GC? --     Constitutional: Alert and oriented. Well appearing and in Moderate distress. Eyes: Conjunctivae are normal. PERRL. EOMI. Head: Atraumatic. Nose: No congestion/rhinnorhea. Mouth/Throat: Mucous membranes are moist.  Oropharynx non-erythematous. Cardiovascular: Normal rate, regular rhythm. Grossly normal heart sounds.  Good peripheral circulation. Respiratory: Increased respiratory effort.  With some increased prolonged expiratory phase, crackles at the left lung base Gastrointestinal: Soft and nontender. No distention.  Rectal: Brown stool that is heme positive on Hemoccult with control positive. Musculoskeletal: No lower extremity tenderness nor edema.   Neurologic:  Normal speech and language.  Skin:  Skin is warm, dry and intact.  Psychiatric: Mood and affect are normal.   ____________________________________________   LABS (all labs ordered are listed, but only abnormal results are displayed)  Labs Reviewed    COMPREHENSIVE METABOLIC PANEL - Abnormal; Notable for the following:       Result Value   CO2 19 (*)    Glucose, Bld 208 (*)    Calcium 8.5 (*)    Albumin 3.1 (*)    ALT 5 (*)    Total Bilirubin 0.2 (*)    All other components within normal limits  LACTIC ACID, PLASMA - Abnormal; Notable for the following:    Lactic Acid, Venous 3.6 (*)    All other components within normal limits  CBC WITH DIFFERENTIAL/PLATELET - Abnormal; Notable for the following:    RBC 2.03 (*)    Hemoglobin 4.6 (*)    HCT 15.6 (*)    MCV 76.9 (*)    MCH 22.8 (*)    MCHC 29.6 (*)    RDW 21.6 (*)    Platelets 590 (*)    Neutro Abs 7.1 (*)  All other components within normal limits  PROTIME-INR - Abnormal; Notable for the following:    Prothrombin Time 16.0 (*)    All other components within normal limits  BLOOD GAS, VENOUS - Abnormal; Notable for the following:    pCO2, Ven 39 (*)    Bicarbonate 19.2 (*)    Acid-base deficit 6.7 (*)    All other components within normal limits  CULTURE, BLOOD (ROUTINE X 2)  CULTURE, BLOOD (ROUTINE X 2)  LACTIC ACID, PLASMA  TYPE AND SCREEN   ____________________________________________  EKG  ED ECG REPORT I, Rebecka Apley, the attending physician, personally viewed and interpreted this ECG.   Date: 08/15/2017  EKG Time: 535  Rate: 61  Rhythm: normal sinus rhythm  Axis: normal  Intervals:none  ST&T Change: Mild ST depression in lead II and III as well as lead V4, V5, V6 with flipped T waves.  ____________________________________________  RADIOLOGY  Dg Chest Portable 1 View  Result Date: 08/15/2017 CLINICAL DATA:  Diagonal respirations.  Sepsis. EXAM: PORTABLE CHEST 1 VIEW COMPARISON:  05/30/2017 FINDINGS: Patchy airspace opacities in the central and basilar left lung could represent pneumonia. Probable small left pleural effusion. Right lung is clear. Pulmonary vasculature is normal. Unchanged cardiomegaly. IMPRESSION: Patchy opacities in the  central and basilar left lung, with a small left pleural effusion. This could represent pneumonia. Electronically Signed   By: Ellery Plunk M.D.   On: 08/15/2017 06:08    ____________________________________________   PROCEDURES  Procedure(s) performed: None  Procedures  Critical Care performed: Yes, see critical care note(s)   CRITICAL CARE Performed by: Lucrezia Europe P   Total critical care time: 35 minutes  Critical care time was exclusive of separately billable procedures and treating other patients.  Critical care was necessary to treat or prevent imminent or life-threatening deterioration.  Critical care was time spent personally by me on the following activities: development of treatment plan with patient and/or surrogate as well as nursing, discussions with consultants, evaluation of patient's response to treatment, examination of patient, obtaining history from patient or surrogate, ordering and performing treatments and interventions, ordering and review of laboratory studies, ordering and review of radiographic studies, pulse oximetry and re-evaluation of patient's condition.   ____________________________________________   INITIAL IMPRESSION / ASSESSMENT AND PLAN / ED COURSE  Pertinent labs & imaging results that were available during my care of the patient were reviewed by me and considered in my medical decision making (see chart for details).  This is a 71 year old male who comes into the hospital today emergency traffic. The patient came in with respiratory arrest. When the patient arrived he had a nonrebreather on but he seemed very sleepy and had some alteration of his mental status. He would answer yes or no questions but would not give me a full story. We did check some blood work with the concern that the patient may be septic. Since he does have crackles on his left lung I was concerned about pneumonia. The patient does have patchy airspace opacities  in his central and basilar left lung. We also found that the patient had a hemoglobin of 4.6 with hematocrit of 15.6 which is down 5 points from June. I did order a blood transfusion as well as some antibiotics for the patient. His lactic acid was also found to be 3.6. I did give him some fluids. The patient was placed on BiPAP when he arrived given his work of breathing. His vital signs were unremarkable the  entire time that he was here. Since he does have heme positive stool I also gave him a dose of Protonix. I will admit the patient to the hospitalist service. We did contact the [name] and they are on diversion for telemetry or any higher level beds. The patient will be admitted to the hospitalist service for further evaluation and treatment of his symptoms.      ____________________________________________   FINAL CLINICAL IMPRESSION(S) / ED DIAGNOSES  Final diagnoses:  Shortness of breath  Acute blood loss anemia  Healthcare-associated pneumonia  Lactic acidosis  Gastrointestinal hemorrhage associated with gastritis, unspecified gastritis type      NEW MEDICATIONS STARTED DURING THIS VISIT:  New Prescriptions   No medications on file     Note:  This document was prepared using Dragon voice recognition software and may include unintentional dictation errors.    Rebecka Apley, MD 08/15/17 [phone]

## 2017-08-15 NOTE — Progress Notes (Signed)
Dr Elpidio AnisSudini made aware of lactic acid 2.7

## 2017-08-15 NOTE — Procedures (Signed)
Endotracheal Intubation: Patient required placement of an artificial airway secondary to Respiratory Failure  Consent: Emergent.   Hand washing performed prior to starting the procedure.   Medications administered for sedation prior to procedure:  Midazolam 4 mg IV,  Vecuronium 10 mg IV, Fentanyl 100 mcg IV.    A time out procedure was called and correct patient, name, & ID confirmed. Needed supplies and equipment were assembled and checked to include ETT, 10 ml syringe, Glidescope, Mac and Miller blades, suction, oxygen and bag mask valve, end tidal CO2 monitor.   Patient was positioned to align the mouth and pharynx to facilitate visualization of the glottis.   Heart rate, SpO2 and blood pressure was continuously monitored during the procedure. Pre-oxygenation was conducted prior to intubation and endotracheal tube was placed through the vocal cords into the trachea.     The artificial airway was placed under direct visualization via glidescope route using a 8.0 ETT on the first attempt.  ETT was secured at 23 cm mark.  Placement was confirmed by auscuitation of lungs with good breath sounds bilaterally and no stomach sounds.  Condensation was noted on endotracheal tube.   Pulse ox 98%.  CO2 detector in place with appropriate color change.   Complications: None .   Operator: Trude Cansler.   Chest radiograph ordered and pending.    Ileene Allie David Ragina Fenter, M.D.  [facility]  Medical Director ICU-[facility] Medical Director [facility]       

## 2017-08-15 NOTE — Progress Notes (Signed)
Dana NP at bedside preparing for left IJ central line placement.

## 2017-08-15 NOTE — Care Management (Signed)
[facility]. I have [facility]- Gaylyn RongLaurie Veasey. CSW updated.

## 2017-08-15 NOTE — Progress Notes (Addendum)
Pt had asystolic event with Dr. Belia HemanKasa at bedside.  CPR performed.  1mg  Atropine given, and 1mg  epi given.  Pt had ROSC.  Pt intubated with #8 ET tube by Annabelle Harmanana NP.    Pt given 100mcg Fentanyl, 4mg  Versed, and 10mg  Vecuronium for intubation.

## 2017-08-15 NOTE — Care Management Note (Signed)
Case Management Note  Patient Details  Name: Blake Villarreal MRN: [id number] Date of Birth: 11/01/1946  Subjective/Objective:   Advised that the pt. Has [facility] benefits and they are on diversion. Pt to be admitted here.                 Action/Plan:   Expected Discharge Date:                  Expected Discharge Plan:     In-House Referral:     Discharge planning Services     Post Acute Care Choice:    Choice offered to:     DME Arranged:    DME Agency:     HH Arranged:    HH Agency:     Status of Service:     If discussed at [employer] Length of Stay Meetings, dates discussed:    Additional Comments:  [name], RN 08/15/2017, 7:34 AM

## 2017-08-15 NOTE — ED Notes (Signed)
[facility] is [facility], dr. Zenda AlpersWebster aware

## 2017-08-15 NOTE — ED Notes (Signed)
Pt taken off Bi-pap to see if he is able to tolerate. Pt c/o being short of breath after taken off Bi-pap but pt is maintaining O2 sats between 97-100%. Verbal order given by Dr. Elpidio AnisSudini for duoneb treatment since pt is wheezing and c/o shortness of breath

## 2017-08-15 NOTE — Consult Note (Signed)
Reason for Consult:Seizure Referring Physician: Sudini  CC: Seizure  HPI: Blake Villarreal is an 71 y.o. male with multiple medical problems noted to have agonal breathing at [facility].  Patient was found to be breathing 3-5 times a minute. EMS was called. He was bagged briefly and put on a CPAP and brought to the emergency room. Patient slowly woke up. He does not remember the events at [facility]  Was found to have PNA, anemia with hgb of 4.6 and heme positive stools.  Patient was admitted and after arriving to the floor had an episode when patient was noted to be unresponsive with HR in the 40's and hypoxic.  Noted to have some drawing of his limbs.  Felt to be seizure and patient ordered to have Keppra.  Patient recovered and was brought to the ICU where during my evaluation complained continuously of having difficulty breathing despite adequate saturation.  After leaving the room patient became asystolic and unresponsive.  No tonic-clonic activity noted.  With no quick recovery patient given Atropine and bagged.  There was then recovery of HR, saturations and patient slowly became more responsive.  Patient amnestic of events.    Past Medical History:  Diagnosis Date  . Diabetes mellitus without complication (HCC)   . Hypertension   . Intermittent confusion   . PAD (peripheral artery disease) (HCC)     Past Surgical History:  Procedure Laterality Date  . ABOVE KNEE LEG AMPUTATION      Family History  Problem Relation Age of Onset  . CAD Neg Hx     Social History:  reports that he has been smoking.  He has never used smokeless tobacco. He reports that he does not drink alcohol or use drugs.  Allergies  Allergen Reactions  . Daptomycin   . Iodinated Diagnostic Agents     Medications:  I have reviewed the patient's current medications. Prior to Admission:  Prescriptions Prior to Admission  Medication Sig Dispense Refill Last Dose  . amLODipine (NORVASC) 10 MG tablet  Take 10 mg by mouth daily.   08/14/2017 at 0900  . aspirin 81 MG chewable tablet Chew 81 mg by mouth daily.   08/14/2017 at 0900  . atorvastatin (LIPITOR) 20 MG tablet Take 20 mg by mouth daily.   08/14/2017 at 2100  . carvedilol (COREG) 12.5 MG tablet Take 37.5 mg by mouth 2 (two) times daily with a meal.   08/14/2017 at 2100  . Cholecalciferol (VITAMIN D3) 2000 units TABS Take 1 tablet by mouth daily.   08/14/2017 at 0900  . clopidogrel (PLAVIX) 75 MG tablet Take 75 mg by mouth daily.   08/14/2017 at 0900  . docusate sodium (COLACE) 100 MG capsule Take 100 mg by mouth daily as needed.    prn at prn  . doxycycline (ADOXA) 100 MG tablet Take 100 mg by mouth 2 (two) times daily.   08/14/2017 at 2100  . doxycycline (DORYX) 100 MG EC tablet Take 100 mg by mouth 2 (two) times daily.   08/14/2017 at 2100  . gabapentin (NEURONTIN) 100 MG capsule Take 100 mg by mouth 3 (three) times daily.   08/14/2017 at 2100  . hydrocerin (EUCERIN) CREA Apply 1 application topically 2 (two) times daily.   08/14/2017 at 1600  . insulin glargine (LANTUS) 100 UNIT/ML injection Inject 5 Units into the skin at bedtime.    08/14/2017 at 2100  . insulin regular (NOVOLIN R,HUMULIN R) 100 units/mL injection Inject 4-14 Units into  the skin 3 (three) times daily before meals. 0-149 = 0 units, 150-200 = 4 units, 201-250 = 7 units, 251-300 = 9 units, 301-350 = 12 units, 351-400 = 14 units, call MD > 400.   08/14/2017 at 1200  . lisinopril (PRINIVIL,ZESTRIL) 40 MG tablet Take 40 mg by mouth daily.   08/14/2017 at 0900  . Magnesium 400 MG TABS Take 400 mg by mouth daily.   08/14/2017 at 0900  . magnesium oxide (MAG-OX) 400 MG tablet Take 400 mg by mouth daily.    08/14/2017 at 0900  . metFORMIN (GLUCOPHAGE) 500 MG tablet Take 500 mg by mouth 2 (two) times daily with a meal.   08/14/2017 at 2100  . mirtazapine (REMERON) 7.5 MG tablet Take 7.5 mg by mouth at bedtime.   08/14/2017 at 2100  . omeprazole (PRILOSEC) 20 MG capsule Take 20 mg by mouth daily.    08/14/2017 at 0600  . terazosin (HYTRIN) 5 MG capsule Take 5 mg by mouth at bedtime.   08/14/2017 at 2100  . vitamin B-12 (CYANOCOBALAMIN) 1000 MCG tablet Take 1,000 mcg by mouth daily.   08/14/2017 at 0900  . acetaminophen (TYLENOL) 325 MG tablet Take 650 mg by mouth every 6 (six) hours as needed.   prn at prn  . albuterol (PROVENTIL) (2.5 MG/3ML) 0.083% nebulizer solution Take 2 ampules by nebulization every 6 (six) hours as needed for wheezing.   prn at prn   Scheduled: . budesonide (PULMICORT) nebulizer solution  0.25 mg Nebulization BID  . insulin aspart  0-15 Units Subcutaneous Q4H  . ipratropium-albuterol  3 mL Nebulization Q4H  . methylPREDNISolone sodium succinate      . methylPREDNISolone (SOLU-MEDROL) injection  40 mg Intravenous Q12H  . pantoprazole (PROTONIX) IV  40 mg Intravenous Q12H  . sodium chloride flush  3 mL Intravenous Q12H    ROS: History obtained from the patient  General ROS: negative for - chills, fatigue, fever, night sweats, weight gain or weight loss Psychological ROS: negative for - behavioral disorder, hallucinations, memory difficulties, mood swings or suicidal ideation Ophthalmic ROS: negative for - blurry vision, double vision, eye pain or loss of vision ENT ROS: negative for - epistaxis, nasal discharge, oral lesions, sore throat, tinnitus or vertigo Allergy and Immunology ROS: negative for - hives or itchy/watery eyes Hematological and Lymphatic ROS: negative for - bleeding problems, bruising or swollen lymph nodes Endocrine ROS: negative for - galactorrhea, hair pattern changes, polydipsia/polyuria or temperature intolerance Respiratory ROS: shortness of breath Cardiovascular ROS: negative for - chest pain, dyspnea on exertion, edema or irregular heartbeat Gastrointestinal ROS: negative for - abdominal pain, diarrhea, hematemesis, nausea/vomiting or stool incontinence Genito-Urinary ROS: negative for - dysuria, hematuria, incontinence or urinary  frequency/urgency Musculoskeletal ROS: back pain Neurological ROS: as noted in HPI Dermatological ROS: negative for rash and skin lesion changes  Physical Examination: Blood pressure 136/60, pulse 70, temperature 98 F (36.7 C), temperature source Oral, resp. rate (!) 23, height 5\' 9"  (1.753 m), weight 79 kg (174 lb 3.2 oz), SpO2 96 %.  HEENT-  Normocephalic, no lesions, without obvious abnormality.  Normal external eye and conjunctiva.  Normal TM's bilaterally.  Normal auditory canals and external ears. Normal external nose, mucus membranes and septum.  Normal pharynx. Cardiovascular- S1, S2 normal, pulses palpable throughout   Lungs- chest clear, no wheezing, rales, normal symmetric air entry Abdomen- soft, non-tender; bowel sounds normal; no masses,  no organomegaly Extremities- emeatous LLE Lymph-no adenopathy palpable Musculoskeletal-Right AKA Skin-vascular skin changes in the  LLE  Neurological Examination   Mental Status: Alert, oriented, thought content appropriate.  Speech fluent without evidence of aphasia.  Able to follow 3 step commands without difficulty. Cranial Nerves: II: Discs flat bilaterally; Visual fields grossly normal, pupils equal, round, reactive to light and accommodation III,IV, VI: ptosis not present, extra-ocular motions intact bilaterally V,VII: smile symmetric, facial light touch sensation normal bilaterally VIII: hearing normal bilaterally IX,X: gag reflex present XI: bilateral shoulder shrug XII: midline tongue extension Motor: Right : Upper extremity   5/5    Left:     Upper extremity   5/5  Lower extremity   AKA     Lower extremity   5/5 Tone and bulk:normal tone throughout; no atrophy noted Sensory: Pinprick and light touch intact throughout, bilaterally Deep Tendon Reflexes: 1+ in the upper extremities and absent in the left lower extremity Plantars: Right: AKA   Left: mute Cerebellar: Normal finger-to-nose testing bilaterally Gait: not tested  due to AKA   Laboratory Studies:   Basic Metabolic Panel:  Recent Labs Lab 08/15/17 0536 08/15/17 1249  NA 137 137  K 4.9 5.2*  CL 108 109  CO2 19* 20*  GLUCOSE 208* 190*  BUN 17 20  CREATININE 1.11 1.06  CALCIUM 8.5* 8.6*  MG  --  1.8  PHOS  --  3.8    Liver Function Tests:  Recent Labs Lab 08/15/17 0536  AST 19  ALT 5*  ALKPHOS 57  BILITOT 0.2*  PROT 6.8  ALBUMIN 3.1*   No results for input(s): LIPASE, AMYLASE in the last 168 hours. No results for input(s): AMMONIA in the last 168 hours.  CBC:  Recent Labs Lab 08/15/17 0536 08/15/17 1249  WBC 9.9 15.0*  NEUTROABS 7.1* 13.8*  HGB 4.6* 5.4*  HCT 15.6* 17.5*  MCV 76.9* 77.1*  PLT 590* 553*    Cardiac Enzymes:  Recent Labs Lab 08/15/17 1249  TROPONINI <0.03    BNP: Invalid input(s): POCBNP  CBG:  Recent Labs Lab 08/15/17 1131 08/15/17 1205  GLUCAP 171* 206*    Microbiology: Results for orders placed or performed during the hospital encounter of 05/30/17  Blood Culture (routine x 2)     Status: None   Collection Time: 05/30/17  3:04 PM  Result Value Ref Range Status   Specimen Description BLOOD BLOOD RIGHT ARM  Final   Special Requests   Final    BOTTLES DRAWN AEROBIC AND ANAEROBIC Blood Culture results may not be optimal due to an excessive volume of blood received in culture bottles   Culture NO GROWTH 5 DAYS  Final   Report Status 06/04/2017 FINAL  Final  Blood Culture (routine x 2)     Status: None   Collection Time: 05/30/17  3:06 PM  Result Value Ref Range Status   Specimen Description BLOOD BLOOD LEFT FOREARM  Final   Special Requests   Final    BOTTLES DRAWN AEROBIC AND ANAEROBIC Blood Culture results may not be optimal due to an excessive volume of blood received in culture bottles   Culture NO GROWTH 5 DAYS  Final   Report Status 06/04/2017 FINAL  Final    Coagulation Studies:  Recent Labs  08/15/17 0536  LABPROT 16.0*  INR 1.29    Urinalysis: No results for  input(s): COLORURINE, LABSPEC, PHURINE, GLUCOSEU, HGBUR, BILIRUBINUR, KETONESUR, PROTEINUR, UROBILINOGEN, NITRITE, LEUKOCYTESUR in the last 168 hours.  Invalid input(s): APPERANCEUR  Lipid Panel:  No results found for: CHOL, TRIG, HDL, CHOLHDL, VLDL, LDLCALC  HgbA1C:  No results found for: HGBA1C  Urine Drug Screen:  No results found for: LABOPIA, COCAINSCRNUR, LABBENZ, AMPHETMU, THCU, LABBARB  Alcohol Level: No results for input(s): ETH in the last 168 hours.  Other results: EKG: sinus rhythm at 61 bpm.  Imaging: Dg Chest Portable 1 View  Result Date: 08/15/2017 CLINICAL DATA:  Diagonal respirations.  Sepsis. EXAM: PORTABLE CHEST 1 VIEW COMPARISON:  05/30/2017 FINDINGS: Patchy airspace opacities in the central and basilar left lung could represent pneumonia. Probable small left pleural effusion. Right lung is clear. Pulmonary vasculature is normal. Unchanged cardiomegaly. IMPRESSION: Patchy opacities in the central and basilar left lung, with a small left pleural effusion. This could represent pneumonia. Electronically Signed   By: Ellery Plunk M.D.   On: 08/15/2017 06:08     Assessment/Plan: 71 year old male admitted after an episode of unresponsiveness as his facility.  Has had multiple since admission.  Seem to start with SOB then cardiac arrhythmia.  Unclear if this is a primary cardiac versus primary neurological event.  Patient with high potassium, low hemoglobin and PNA complicating clinical scenario further.  Serum magnesium and phosphorus are normal.  Will cover for possible seizures with further work up to follow.  Recommendations: 1.  Would not load Keppra.  Would load Vimpat 200mg  IV now with maintenance of 100mg  q 12 hours 2.  Seizure precautions 3.  EEG 4.  Head CT without contrast once stable.  If unremarkable, MRI of the brain indicated. 5.  UDS  This patient is critically ill and at significant risk of neurological worsening, death and care requires constant  monitoring of vital signs, hemodynamics,respiratory and cardiac monitoring, neurological assessment, discussion with family, other specialists and medical decision making of high complexity. I spent 35 minutes of neurocritical care time  in the care of  this patient.   Thana Farr, MD Neurology [phone] 08/15/2017, 1:57 PM

## 2017-08-15 NOTE — Consult Note (Signed)
Name: Blake Villarreal MRN: [id number] DOB: 23-Dec-1945    ADMISSION DATE:  08/15/2017 CONSULTATION DATE: 08/05/2017  REFERRING MD : Dr. Elpidio Anis  CHIEF COMPLAINT: Respiratory Arrest   BRIEF PATIENT DESCRIPTION:  71 yo male admitted 08/31 due to respiratory arrest and severe anemia with acute blood loss requiring transfer to the Stepdown Unit   SIGNIFICANT EVENTS  08/31-Pt admitted to the medsurg unit then required transfer to the stepdown unit   STUDIES:  None   HISTORY OF PRESENT ILLNESS:   This is a 71 yo male with a PMH of PAD, Intermittent Confusion, Right AKA, HTN, ETOH Abuse, COPD, and Diabetes Mellitus.  He presented to [facility] 08/31 from [facility] via EMS after being found unresponsive with agonal respirations for about 3 minutes requiring bagged mask ventilation. The pt began having spontaneous respirations following interventions and was placed on a NRB mask.  Upon arrival to the ER he was alert and oriented in no acute distress with O2 sats 100% on NRB.  Per ER notes the pt was recently admitted to the hospital for sepsis in June although source of infection unknown.  In the ER lab results revealed hgb 4.6 per ER notes the pt was occult positive and he does take plavix and aspirin.  CXR revealed possible pneumonia and initial lactic acid 3.6.  He was subsequently admitted to the [facility] unit by hospitalist team for further workup and treatment.  On 08/31 a code blue was initiated due to pt becoming asystole on telemetry monitor for a brief period of time with agonal respirations requiring bag mask ventilation. He began having spontaneous respirations and was placed on a NRB mask and transferred to the stepdown unit PCCM consulted.  The pt did not require ACLS medications or CPR cardiac rhythm returned to sinus rhythm.    PAST MEDICAL HISTORY :   has a past medical history of Diabetes mellitus without complication (HCC); Hypertension; Intermittent confusion; and PAD (peripheral  artery disease) (HCC).  has a past surgical history that includes Above knee leg amputaton. Prior to Admission medications   Medication Sig Start Date End Date Taking? Authorizing Provider  amLODipine (NORVASC) 10 MG tablet Take 10 mg by mouth daily.   Yes [provider]  aspirin 81 MG chewable tablet Chew 81 mg by mouth daily.   Yes [provider]  atorvastatin (LIPITOR) 20 MG tablet Take 20 mg by mouth daily.   Yes [provider]  carvedilol (COREG) 12.5 MG tablet Take 37.5 mg by mouth 2 (two) times daily with a meal.   Yes [provider]  Cholecalciferol (VITAMIN D3) 2000 units TABS Take 1 tablet by mouth daily.   Yes [provider]  clopidogrel (PLAVIX) 75 MG tablet Take 75 mg by mouth daily.   Yes [provider]  docusate sodium (COLACE) 100 MG capsule Take 100 mg by mouth daily as needed.    Yes [provider]  doxycycline (ADOXA) 100 MG tablet Take 100 mg by mouth 2 (two) times daily.   Yes [provider]  doxycycline (DORYX) 100 MG EC tablet Take 100 mg by mouth 2 (two) times daily.   Yes [provider]  gabapentin (NEURONTIN) 100 MG capsule Take 100 mg by mouth 3 (three) times daily.   Yes [provider]  hydrocerin (EUCERIN) CREA Apply 1 application topically 2 (two) times daily.   Yes [provider]  insulin glargine (LANTUS) 100 UNIT/ML injection Inject 5 Units into the  skin at bedtime.    Yes [provider]  insulin regular (NOVOLIN R,HUMULIN R) 100 units/mL injection Inject 4-14 Units into the skin 3 (three) times daily before meals. 0-149 = 0 units, 150-200 = 4 units, 201-250 = 7 units, 251-300 = 9 units, 301-350 = 12 units, 351-400 = 14 units, call MD > 400.   Yes [provider]  lisinopril (PRINIVIL,ZESTRIL) 40 MG tablet Take 40 mg by mouth daily.   Yes [provider]  Magnesium 400 MG TABS Take 400 mg by mouth daily.   Yes [provider]  magnesium oxide (MAG-OX) 400 MG tablet Take 400 mg by mouth daily.    Yes [provider]  metFORMIN (GLUCOPHAGE) 500 MG tablet Take 500 mg by mouth 2 (two) times daily with a meal.   Yes [provider]  mirtazapine (REMERON) 7.5 MG tablet Take 7.5 mg by mouth at bedtime.   Yes [provider]  omeprazole (PRILOSEC) 20 MG capsule Take 20 mg by mouth daily.   Yes [provider]  terazosin (HYTRIN) 5 MG capsule Take 5 mg by mouth at bedtime.   Yes [provider]  vitamin B-12 (CYANOCOBALAMIN) 1000 MCG tablet Take 1,000 mcg by mouth daily.   Yes [provider]  acetaminophen (TYLENOL) 325 MG tablet Take 650 mg by mouth every 6 (six) hours as needed.    [provider]  albuterol (PROVENTIL) (2.5 MG/3ML) 0.083% nebulizer solution Take 2 ampules by nebulization every 6 (six) hours as needed for wheezing.    [provider]   Allergies  Allergen Reactions  . Daptomycin   . Iodinated Diagnostic Agents     FAMILY HISTORY:  family history is not on file. SOCIAL HISTORY:  reports that he has been smoking.  He has never used smokeless tobacco. He reports that he does not drink alcohol or use drugs.  REVIEW OF SYSTEMS:   Unable to assess pt in mild respiratory distress with non rebreather mask in place   SUBJECTIVE:  Unable to assess pt in mild respiratory distress with non rebreather mask in place   VITAL SIGNS: Temp:  [97.1 F (36.2 C)-98 F (36.7 C)] 98 F (36.7 C) (08/31 1003) Pulse Rate:  [44-68] 44 (08/31 1129) Resp:  [13-27] 16 (08/31 1003) BP: (108-126)/(42-57) 108/54 (08/31 1129) SpO2:  [65 %-100 %] 100 % (08/31 1136) FiO2 (%):  [50 %] 50 % (08/31 0543) Weight:  [77.1 kg (170 lb)-79 kg (174 lb 3.2 oz)] 79 kg (174 lb 3.2 oz) (08/31 1003)  PHYSICAL EXAMINATION: General: acutely ill appearing AA male Neuro: alert, following commands, PERRL  HEENT: supple, no JVD  Cardiovascular: s1s2, rrr,  no M/R/G Lungs: diminished throughout, even, non labored on non rebreather mask  Abdomen: hypoactive BS x4, soft, non tender, non distended  Musculoskeletal: right aka, 2+ edema left lower extremity  Skin: chronic vascular changes to left lower extremity    Recent Labs Lab 08/15/17 0536  NA 137  K 4.9  CL 108  CO2 19*  BUN 17  CREATININE 1.11  GLUCOSE 208*    Recent Labs Lab 08/15/17 0536  HGB 4.6*  HCT 15.6*  WBC 9.9  PLT 590*   Dg Chest Portable 1 View  Result Date: 08/15/2017 CLINICAL DATA:  Diagonal respirations.  Sepsis. EXAM: PORTABLE CHEST 1 VIEW COMPARISON:  05/30/2017 FINDINGS: Patchy airspace opacities in the central and basilar left lung could represent pneumonia. Probable small left pleural effusion. Right lung is clear. Pulmonary  vasculature is normal. Unchanged cardiomegaly. IMPRESSION: Patchy opacities in the central and basilar left lung, with a small left pleural effusion. This could represent pneumonia. Electronically Signed   By: Ellery Plunk M.D.   On: 08/15/2017 06:08    ASSESSMENT / PLAN: Acute on chronic hypoxic respiratory failure secondary to possible pneumonia and AECOPD  Acute encephalopathy secondary possible seizure activity Cardiac Arrest etiology unknown-rhythm asystole Anemia with acute blood loss  Lactic acidosis  Hx: Type II DM P: Supplemental O2 for dyspnea and/or to maintain O2 sats 88% to 92% Continue scheduled and prn bronchodilator therapy  Will add nebulized steroids Continue IV steroids Stat ABG Prn CXR  CT Head, EEG, and [place] bilateral carotids pending  Seizure and Aspiration precautions  Prn ativan for seizure activity  Continuous telemetry monitoring Trend troponin's Echo pending Hold outpatient plavix and aspirin for now  Trend CBC Monitor for s/sx of bleeding Transfuse for hgb <7 GI and neurology consulted appreciate input  Trend WBC and monitor fever curve Trend PCT and lactic acid Continue levaquin Follow  cultures Will add UA and urine culture    Sonda Rumble, AGNP  Pulmonary/Critical Care Pager [phone] (please enter 7 digits) PCCM Consult Pager [phone] (please enter 7 digits)

## 2017-08-15 NOTE — Clinical Social Work Note (Signed)
Clinical Social Work Assessment  Patient Details  Name: Blake Villarreal Sessionetie W Knipp MRN: [id number] Date of Birth: 11/01/1946  Date of referral:  08/15/17               Reason for consult:  Discharge Planning                Permission sought to share information with:    Permission granted to share information::     Name::        Agency::     Relationship::     Contact Information:     Housing/Transportation Living arrangements for the past 2 months:  Skilled Nursing Facility Source of Information:   (relative) Patient Interpreter Needed:  None Criminal Activity/Legal Involvement Pertinent to Current Situation/Hospitalization:  No - Comment as needed Significant Relationships:    Lives with:  Facility Resident Do you feel safe going back to the place where you live?    Need for family participation in patient care:     Care giving concerns:  [facility]   Social Worker assessment / plan:  CSW contacted patient's relative: Blake Villarreal: [phone], and explained role and purpose of visit. Blake Villarreal [facility] at this time. Blake Villarreal confirmed that she does wish for [facility]. [facility] and [facility].   Employment status:  Disabled (Comment on whether or not currently receiving Disability) Insurance information:  [employer] Benef[employer] PT Recommendations:  Not assessed at this time Information / Referral to community resources:     Patient/Family's Response to care:  Blake Villarreal expressed appreciation for CSW assistance.  Patient/Family's Understanding of and Emotional Response to Diagnosis, Current Treatment, and Prognosis:  Blake Villarreal is involved with patient's care and CSW will continue to follow.  Emotional Assessment Appearance:    Attitude/Demeanor/Rapport:    Affect (typically observed):    Orientation:    Alcohol / Substance use:  Not  Applicable Psych involvement (Current and /or in the community):  No (Comment)  Discharge Needs  Concerns to be addressed:  Care Coordination Readmission within the last 30 days:  No Current discharge risk:  None Barriers to Discharge:  No Barriers Identified   York SpanielMonica Naiyah Klostermann, LCSW 08/15/2017, 3:47 PM

## 2017-08-15 NOTE — ED Notes (Addendum)
RN at bedside, pt O2 sat decreased to 90 % on room air, pt not c/o shortness of breath at this time. Pt was placed on [place] at 3 LPM.

## 2017-08-15 NOTE — Progress Notes (Signed)
Yellow colored metal chain with cross pendant removed from patients neck and given to brother.

## 2017-08-15 NOTE — Progress Notes (Signed)
Silver colored metal ring with black rectangular stone removed from patients left hand.  Brother not available at this time to give him ring.  Will hold for now.

## 2017-08-15 NOTE — ED Notes (Signed)
Dr. Zenda AlpersWebster at bedside to perform hemoccult, test positive

## 2017-08-15 NOTE — H&P (Signed)
[facility] [place] [facility]   PATIENT NAME: Blake Villarreal    MR#:  [id number]  DATE OF BIRTH:  16-Dec-1946  DATE OF ADMISSION:  08/15/2017  PRIMARY CARE PHYSICIAN: Keane Police, MD   REQUESTING/REFERRING PHYSICIAN: Dr. Zenda Alpers  CHIEF COMPLAINT:   Chief Complaint  Patient presents with  . Shortness of Breath    HISTORY OF PRESENT ILLNESS:  Blake Villarreal  is a 71 y.o. 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. Patient was found to be breathing 3-5 times a minute. EMS was called. He was bagged briefly and put on a CPAP and brought to the emergency room. Patient slowly woke up. He does not remember the events at [facility]. No seizures. Does not complain of any focal weakness. Here he has been found to have left lower lobe pneumonia and anemia with hemoglobin of 4.6. Brown stool which is heme positive. No history of GI bleed. He was drinking heavily in the past but has quit according to the cousin at bedside. He does have intermittent confusion. No diagnosis of dementia.  PAST MEDICAL HISTORY:   Past Medical History:  Diagnosis Date  . Diabetes mellitus without complication (HCC)   . Hypertension   . Intermittent confusion   . PAD (peripheral artery disease) (HCC)     PAST SURGICAL HISTORY:   Past Surgical History:  Procedure Laterality Date  . ABOVE KNEE LEG AMPUTATION      SOCIAL HISTORY:   Social History  Substance Use Topics  . Smoking status: Current Every Day Smoker  . Smokeless tobacco: Never Used  . Alcohol use No     Comment: Drank heavily in the past per his cousin    FAMILY HISTORY:   Family History  Problem Relation Age of Onset  . CAD Neg Hx     DRUG ALLERGIES:   Allergies  Allergen Reactions  .  Daptomycin   . Iodinated Diagnostic Agents     REVIEW OF SYSTEMS:   Review of Systems  Constitutional: Positive for malaise/fatigue. Negative for chills and fever.  HENT: Negative for sore throat.   Eyes: Negative for blurred vision, double vision and pain.  Respiratory: Positive for cough, shortness of breath and wheezing. Negative for hemoptysis.   Cardiovascular: Negative for chest pain, palpitations, orthopnea and leg swelling.  Gastrointestinal: Negative for abdominal pain, constipation, diarrhea, heartburn, nausea and vomiting.  Genitourinary: Negative for dysuria and hematuria.  Musculoskeletal: Negative for back pain and joint pain.  Skin: Negative for rash.  Neurological: Positive for weakness. Negative for sensory change, speech change, focal weakness and headaches.  Endo/Heme/Allergies: Does not bruise/bleed easily.  Psychiatric/Behavioral: Positive for memory loss. Negative for depression. The patient is not nervous/anxious.     MEDICATIONS AT HOME:   Prior to Admission medications   Medication Sig Start Date End Date Taking? Authorizing Provider  amLODipine (NORVASC) 10 MG tablet Take 10 mg by mouth daily.   Yes [provider]  aspirin 81 MG chewable tablet Chew 81 mg by mouth daily.   Yes [provider]  atorvastatin (LIPITOR) 20 MG tablet Take 20 mg by mouth daily.   Yes [provider]  carvedilol (COREG) 12.5 MG tablet Take 37.5 mg by mouth 2 (two) times daily with a meal.   Yes [provider]  Cholecalciferol (VITAMIN D3) 2000 units TABS Take 1 tablet by mouth daily.   Yes [provider]  clopidogrel (PLAVIX) 75 MG tablet Take 75 mg by mouth daily.   Yes [provider]  docusate sodium (COLACE) 100 MG capsule Take 100 mg by mouth daily as needed.    Yes [provider]  doxycycline (ADOXA) 100 MG tablet Take 100 mg by mouth 2 (two) times daily.   Yes [provider]  doxycycline (DORYX) 100  MG EC tablet Take 100 mg by mouth 2 (two) times daily.   Yes [provider]  gabapentin (NEURONTIN) 100 MG capsule Take 100 mg by mouth 3 (three) times daily.   Yes [provider]  hydrocerin (EUCERIN) CREA Apply 1 application topically 2 (two) times daily.   Yes [provider]  insulin glargine (LANTUS) 100 UNIT/ML injection Inject 5 Units into the skin at bedtime.    Yes [provider]  insulin regular (NOVOLIN R,HUMULIN R) 100 units/mL injection Inject 4-14 Units into the skin 3 (three) times daily before meals. 0-149 = 0 units, 150-200 = 4 units, 201-250 = 7 units, 251-300 = 9 units, 301-350 = 12 units, 351-400 = 14 units, call MD > 400.   Yes [provider]  lisinopril (PRINIVIL,ZESTRIL) 40 MG tablet Take 40 mg by mouth daily.   Yes [provider]  Magnesium 400 MG TABS Take 400 mg by mouth daily.   Yes [provider]  magnesium oxide (MAG-OX) 400 MG tablet Take 400 mg by mouth daily.    Yes [provider]  metFORMIN (GLUCOPHAGE) 500 MG tablet Take 500 mg by mouth 2 (two) times daily with a meal.   Yes [provider]  mirtazapine (REMERON) 7.5 MG tablet Take 7.5 mg by mouth at bedtime.   Yes [provider]  omeprazole (PRILOSEC) 20 MG capsule Take 20 mg by mouth daily.   Yes [provider]  terazosin (HYTRIN) 5 MG capsule Take 5 mg by mouth at bedtime.   Yes [provider]  vitamin B-12 (CYANOCOBALAMIN) 1000 MCG tablet Take 1,000 mcg by mouth daily.   Yes [provider]  acetaminophen (TYLENOL) 325 MG tablet Take 650 mg by mouth every 6 (six) hours as needed.    [provider]  albuterol (PROVENTIL) (2.5 MG/3ML) 0.083% nebulizer solution Take 2 ampules by nebulization every 6 (six) hours as needed for wheezing.    [provider]     VITAL SIGNS:  Blood pressure (!) 112/57, pulse (!) 52, temperature (!) 97.1 F (36.2 C), resp. rate 16, height 5'  9" (1.753 m), weight 77.1 kg (170 lb), SpO2 100 %.  PHYSICAL EXAMINATION:  Physical Exam  GENERAL:  71 y.o.-year-old patient lying in the bed EYES: Pupils equal, round, reactive to light and accommodation. No scleral icterus. Extraocular muscles intact.  HEENT: Head atraumatic, normocephalic. Oropharynx and nasopharynx clear. No oropharyngeal erythema, moist oral mucosa  NECK:  Supple, no jugular venous distention. No thyroid enlargement, no tenderness.  LUNGS: Bilateral wheezing and decreased air entry CARDIOVASCULAR: S1, S2 normal. No murmurs, rubs, or gallops.  ABDOMEN: Soft, nontender, nondistended. Bowel sounds present. No organomegaly or mass.  EXTREMITIES: No pedal edema, cyanosis, or clubbing. + 2 pedal & radial pulses b/l.  Right above-knee amputation NEUROLOGIC: Cranial nerves II through XII are intact. No focal Motor or sensory deficits appreciated b/l PSYCHIATRIC: The patient is alert and awake. Anxious SKIN: No obvious rash, lesion, or ulcer.   LABORATORY  PANEL:   CBC  Recent Labs Lab 08/15/17 0536  WBC 9.9  HGB 4.6*  HCT 15.6*  PLT 590*   ------------------------------------------------------------------------------------------------------------------  Chemistries   Recent Labs Lab 08/15/17 0536  NA 137  K 4.9  CL 108  CO2 19*  GLUCOSE 208*  BUN 17  CREATININE 1.11  CALCIUM 8.5*  AST 19  ALT 5*  ALKPHOS 57  BILITOT 0.2*   ------------------------------------------------------------------------------------------------------------------  Cardiac Enzymes No results for input(s): TROPONINI in the last 168 hours. ------------------------------------------------------------------------------------------------------------------  RADIOLOGY:  Dg Chest Portable 1 View  Result Date: 08/15/2017 CLINICAL DATA:  Diagonal respirations.  Sepsis. EXAM: PORTABLE CHEST 1 VIEW COMPARISON:  05/30/2017 FINDINGS: Patchy airspace opacities in the central and basilar  left lung could represent pneumonia. Probable small left pleural effusion. Right lung is clear. Pulmonary vasculature is normal. Unchanged cardiomegaly. IMPRESSION: Patchy opacities in the central and basilar left lung, with a small left pleural effusion. This could represent pneumonia. Electronically Signed   By: Ellery Plunkaniel R Mitchell M.D.   On: 08/15/2017 06:08     IMPRESSION AND PLAN:   * GI bleed with acute blood loss anemia. Hemoglobin at 4.6. Will need transfusion of 3 units packed RBC. Repeat hemoglobin after blood transfusion. Start IV Protonix. Consult GI. Will need EGD once stabilized. We'll keep him nothing by mouth except medications.  * Acute hypoxic respiratory failure due to left lower lobe pneumonia and COPD exacerbation. We'll start Levaquin. Sputum and blood cultures. Oxygen as needed. Nebulizers scheduled. IV steroids.  * Diabetes mellitus. Will be on every 4 Accu-Cheks. Will likely remain uncontrolled due to IV steroids. Sliding scale insulin.  *Hypertension. Hold medications at this time. Restart her blood pressure elevated.  * DVT prophylaxis with SCDs.  All the records are reviewed and case discussed with ED provider. Management plans discussed with the patient, family and they are in agreement.  CODE STATUS: Full code  TOTAL CC TIME TAKING CARE OF THIS PATIENT: 40 minutes.   Milagros LollSudini, Nasim Garofano R M.D on 08/15/2017 at 8:25 AM  Between 7am to 6pm - Pager - [phone]  After 6pm go to [email or web address] - password [email or web address] [facility]  [facility]  Office  [phone]  CC: Primary care physician; Keane PoliceSlade-Hartman, Venezela, MD  Note: This dictation was prepared with Dragon dictation along with smaller phrase technology. Any transcriptional errors that result from this process are unintentional.

## 2017-08-15 NOTE — Progress Notes (Signed)
Medical Alert Code Blue called. On arrival patient was unresponsive HR-40's being bagged. No meds or chest compressions. FSBS 171. MD in attendence. Pt transferred to ICU-20.

## 2017-08-15 NOTE — ED Notes (Signed)
RT at bedside to initiate bipap.

## 2017-08-15 NOTE — NC FL2 (Signed)
[place] MEDICAID FL2 LEVEL OF CARE SCREENING TOOL     IDENTIFICATION  Patient Name: Blake Villarreal Sessionetie W Lambe Birthdate: 11/01/1946 Sex: male Admission Date (Current Location): 08/15/2017  [place]   Facility and Address:  [facility], [address], [place], [place] [address]      Provider Number: [id number]  Attending Physician Name and Address:  Milagros LollSudini, Srikar, MD  Relative Name and Phone Number:       Current Level of Care: Hospital Recommended Level of Care: Skilled Nursing Facility Prior Approval Number:    Date Approved/Denied:   PASRR Number: [id number]  Discharge Plan: SNF    Current Diagnoses: Patient Active Problem List   Diagnosis Date Noted  . Anemia 08/15/2017    Orientation RESPIRATION BLADDER Height & Weight     Self  Normal, O2 (6 liters) Continent Weight: 174 lb 3.2 oz (79 kg) Height:  5\' 9"  (175.3 cm)  BEHAVIORAL SYMPTOMS/MOOD NEUROLOGICAL BOWEL NUTRITION STATUS   (none)  (none) Continent Diet (currently npo but to be advanced)  AMBULATORY STATUS COMMUNICATION OF NEEDS Skin   Extensive Assist Verbally Normal                       Personal Care Assistance Level of Assistance  Total care       Total Care Assistance: Maximum assistance   Functional Limitations Info             SPECIAL CARE FACTORS FREQUENCY                       Contractures Contractures Info: Not present    Additional Factors Info  Allergies   Allergies Info: daptomycin, iodinated agents           Current Medications (08/15/2017):  This is the current hospital active medication list Current Facility-Administered Medications  Medication Dose Route Frequency Provider Last Rate Last Dose  . morphine 2 MG/ML injection           . acetaminophen (TYLENOL) tablet 650 mg  650 mg Oral Q6H PRN Milagros LollSudini, Srikar, MD       Or  . acetaminophen (TYLENOL) suppository 650 mg  650 mg Rectal Q6H PRN Sudini,  Srikar, MD      . albuterol (PROVENTIL) (2.5 MG/3ML) 0.083% nebulizer solution 2.5 mg  2.5 mg Nebulization Q2H PRN Sudini, Srikar, MD      . budesonide (PULMICORT) nebulizer solution 0.25 mg  0.25 mg Nebulization BID Eugenie NorrieBlakeney, Dana G, NP   0.25 mg at 08/15/17 1446  . insulin aspart (novoLOG) injection 0-15 Units  0-15 Units Subcutaneous Q4H Sudini, Srikar, MD      . ipratropium-albuterol (DUONEB) 0.5-2.5 (3) MG/3ML nebulizer solution 3 mL  3 mL Nebulization Q4H Eugenie NorrieBlakeney, Dana G, NP      . Melene Muller[START ON 08/16/2017] lacosamide (VIMPAT) 100 mg in sodium chloride 0.9 % 25 mL IVPB  100 mg Intravenous Q12H Thana Farreynolds, Leslie, MD      . levofloxacin (LEVAQUIN) IVPB 750 mg  750 mg Intravenous Q24H Sudini, Srikar, MD      . LORazepam (ATIVAN) injection 1-2 mg  1-2 mg Intravenous Q4H PRN Eugenie NorrieBlakeney, Dana G, NP      . methylPREDNISolone sodium succinate (SOLU-MEDROL) 125 mg/2 mL injection           . methylPREDNISolone sodium succinate (SOLU-MEDROL) 40 mg/mL injection 40 mg  40 mg Intravenous Q12H Eugenie NorrieBlakeney, Dana G, NP      .  ondansetron (ZOFRAN) tablet 4 mg  4 mg Oral Q6H PRN Milagros Loll, MD       Or  . ondansetron (ZOFRAN) injection 4 mg  4 mg Intravenous Q6H PRN Sudini, Srikar, MD      . oxyCODONE (Oxy IR/ROXICODONE) immediate release tablet 5 mg  5 mg Oral Q4H PRN Sudini, Srikar, MD      . pantoprazole (PROTONIX) injection 40 mg  40 mg Intravenous Q12H Sudini, Srikar, MD      . polyethylene glycol (MIRALAX / GLYCOLAX) packet 17 g  17 g Oral Daily PRN Sudini, Srikar, MD      . sodium chloride flush (NS) 0.9 % injection 3 mL  3 mL Intravenous Q12H Milagros Loll, MD   3 mL at 08/15/17 0919     Discharge Medications: Please see discharge summary for a list of discharge medications.  Relevant Imaging Results:  Relevant Lab Results:   Additional Information    York Spaniel, LCSW

## 2017-08-16 ENCOUNTER — Inpatient Hospital Stay: Payer: Non-veteran care

## 2017-08-16 DIAGNOSIS — J9602 Acute respiratory failure with hypercapnia: Secondary | ICD-10-CM

## 2017-08-16 DIAGNOSIS — L8990 Pressure ulcer of unspecified site, unspecified stage: Secondary | ICD-10-CM | POA: Insufficient documentation

## 2017-08-16 DIAGNOSIS — G9340 Encephalopathy, unspecified: Secondary | ICD-10-CM

## 2017-08-16 LAB — BLOOD GAS, ARTERIAL
Acid-base deficit: 4 mmol/L — ABNORMAL HIGH (ref 0.0–2.0)
Bicarbonate: 22.6 mmol/L (ref 20.0–28.0)
FIO2: 0.35
MECHVT: 500 mL
O2 SAT: 97.6 %
PATIENT TEMPERATURE: 37
PEEP/CPAP: 5 cmH2O
PO2 ART: 108 mmHg (ref 83.0–108.0)
RATE: 16 resp/min
pCO2 arterial: 47 mmHg (ref 32.0–48.0)
pH, Arterial: 7.29 — ABNORMAL LOW (ref 7.350–7.450)

## 2017-08-16 LAB — GLUCOSE, CAPILLARY
GLUCOSE-CAPILLARY: 123 mg/dL — AB (ref 65–99)
GLUCOSE-CAPILLARY: 124 mg/dL — AB (ref 65–99)
GLUCOSE-CAPILLARY: 126 mg/dL — AB (ref 65–99)
GLUCOSE-CAPILLARY: 138 mg/dL — AB (ref 65–99)
Glucose-Capillary: 113 mg/dL — ABNORMAL HIGH (ref 65–99)
Glucose-Capillary: 120 mg/dL — ABNORMAL HIGH (ref 65–99)

## 2017-08-16 LAB — HEMOGLOBIN AND HEMATOCRIT, BLOOD
HCT: 24.8 % — ABNORMAL LOW (ref 40.0–52.0)
HCT: 24.3 % — ABNORMAL LOW (ref 40.0–52.0)
HEMATOCRIT: 21.8 % — AB (ref 40.0–52.0)
HEMOGLOBIN: 7.1 g/dL — AB (ref 13.0–18.0)
Hemoglobin: 8.1 g/dL — ABNORMAL LOW (ref 13.0–18.0)
Hemoglobin: 7.9 g/dL — ABNORMAL LOW (ref 13.0–18.0)

## 2017-08-16 LAB — BASIC METABOLIC PANEL
Anion gap: 6 (ref 5–15)
BUN: 26 mg/dL — ABNORMAL HIGH (ref 6–20)
CALCIUM: 7.9 mg/dL — AB (ref 8.9–10.3)
CO2: 23 mmol/L (ref 22–32)
CREATININE: 1.12 mg/dL (ref 0.61–1.24)
Chloride: 107 mmol/L (ref 101–111)
GFR calc Af Amer: >60 mL/min (ref >60)
Glucose, Bld: 108 mg/dL — ABNORMAL HIGH (ref 65–99)
Potassium: 4.7 mmol/L (ref 3.5–5.1)
SODIUM: 136 mmol/L (ref 135–145)

## 2017-08-16 LAB — URINALYSIS, COMPLETE (UACMP) WITH MICROSCOPIC
BACTERIA UA: NONE SEEN
BILIRUBIN URINE: NEGATIVE
Glucose, UA: NEGATIVE mg/dL
KETONES UR: NEGATIVE mg/dL
Nitrite: NEGATIVE
PH: 5 (ref 5.0–8.0)
PROTEIN: NEGATIVE mg/dL
SQUAMOUS EPITHELIAL / LPF: NONE SEEN
Specific Gravity, Urine: 1.013 (ref 1.005–1.030)

## 2017-08-16 LAB — CBC
HCT: 23.3 % — ABNORMAL LOW (ref 40.0–52.0)
Hemoglobin: 7.6 g/dL — ABNORMAL LOW (ref 13.0–18.0)
MCH: 26.3 pg (ref 26.0–34.0)
MCHC: 32.8 g/dL (ref 32.0–36.0)
MCV: 80.4 fL (ref 80.0–100.0)
PLATELETS: 467 10*3/uL — AB (ref 150–440)
RBC: 2.9 MIL/uL — ABNORMAL LOW (ref 4.40–5.90)
RDW: 18.7 % — ABNORMAL HIGH (ref 11.5–14.5)
WBC: 12.6 10*3/uL — ABNORMAL HIGH (ref 3.8–10.6)

## 2017-08-16 LAB — PREPARE RBC (CROSSMATCH)

## 2017-08-16 LAB — TROPONIN I: Troponin I: 0.32 ng/mL (ref <0.03)

## 2017-08-16 LAB — LACTIC ACID, PLASMA: Lactic Acid, Venous: 0.9 mmol/L (ref 0.5–1.9)

## 2017-08-16 MED ORDER — SODIUM CHLORIDE 0.9 % IV SOLN: Freq: Once | INTRAVENOUS | Status: AC

## 2017-08-16 MED ORDER — SODIUM CHLORIDE 0.9 % IV SOLN
Freq: Once | INTRAVENOUS | Status: AC
  Administered 2017-08-16: 05:00:00 via INTRAVENOUS

## 2017-08-16 NOTE — Consult Note (Signed)
Referring Provider: Dr. Manuella Ghazi Primary Care Physician:  Alvester Morin, MD Primary Gastroenterologist:  Althia Forts  Reason for Consultation:  Anemia; Heme positive stool  HPI: Blake Villarreal is a 71 y.o. male with multiple medical problems who coded yesterday and is currently intubated and sedated. Respiratory failure with PNA and COPD on IV steroids and IV Abx. Hgb 4.6 on admit. Heme positive brown stool. No history of GI bleed. History of alcohol abuse. Being evaluated by neurology for possible seizure. No family at bedside. Nurse in room.  Past Medical History:  Diagnosis Date  . Diabetes mellitus without complication ([place])   . Hypertension   . Intermittent confusion   . PAD (peripheral artery disease) ([place])     Past Surgical History:  Procedure Laterality Date  . ABOVE KNEE LEG AMPUTATION      Prior to Admission medications   Medication Sig Start Date End Date Taking? Authorizing Provider  amLODipine (NORVASC) 10 MG tablet Take 10 mg by mouth daily.   Yes [provider]  aspirin 81 MG chewable tablet Chew 81 mg by mouth daily.   Yes [provider]  atorvastatin (LIPITOR) 20 MG tablet Take 20 mg by mouth daily.   Yes [provider]  carvedilol (COREG) 12.5 MG tablet Take 37.5 mg by mouth 2 (two) times daily with a meal.   Yes [provider]  Cholecalciferol (VITAMIN D3) 2000 units TABS Take 1 tablet by mouth daily.   Yes [provider]  clopidogrel (PLAVIX) 75 MG tablet Take 75 mg by mouth daily.   Yes [provider]  docusate sodium (COLACE) 100 MG capsule Take 100 mg by mouth daily as needed.    Yes [provider]  doxycycline (ADOXA) 100 MG tablet Take 100 mg by mouth 2 (two) times daily.   Yes [provider]  doxycycline (DORYX) 100 MG EC tablet Take 100 mg by mouth 2 (two) times daily.   Yes [provider]  gabapentin (NEURONTIN) 100 MG capsule Take 100 mg by mouth 3 (three) times  daily.   Yes [provider]  hydrocerin (EUCERIN) CREA Apply 1 application topically 2 (two) times daily.   Yes [provider]  insulin glargine (LANTUS) 100 UNIT/ML injection Inject 5 Units into the skin at bedtime.    Yes [provider]  insulin regular (NOVOLIN R,HUMULIN R) 100 units/mL injection Inject 4-14 Units into the skin 3 (three) times daily before meals. 0-149 = 0 units, 150-200 = 4 units, 201-250 = 7 units, 251-300 = 9 units, 301-350 = 12 units, 351-400 = 14 units, call MD > 400.   Yes [provider]  lisinopril (PRINIVIL,ZESTRIL) 40 MG tablet Take 40 mg by mouth daily.   Yes [provider]  Magnesium 400 MG TABS Take 400 mg by mouth daily.   Yes [provider]  magnesium oxide (MAG-OX) 400 MG tablet Take 400 mg by mouth daily.    Yes [provider]  metFORMIN (GLUCOPHAGE) 500 MG tablet Take 500 mg by mouth 2 (two) times daily with a meal.   Yes [provider]  mirtazapine (REMERON) 7.5 MG tablet Take 7.5 mg by mouth at bedtime.   Yes [provider]  omeprazole (PRILOSEC) 20 MG capsule Take 20 mg by mouth daily.   Yes [provider]  terazosin (HYTRIN) 5 MG capsule Take 5 mg by mouth at bedtime.   Yes [provider]  vitamin B-12 (CYANOCOBALAMIN) 1000 MCG tablet Take 1,000 mcg  by mouth daily.   Yes [provider]  acetaminophen (TYLENOL) 325 MG tablet Take 650 mg by mouth every 6 (six) hours as needed.    [provider]  albuterol (PROVENTIL) (2.5 MG/3ML) 0.083% nebulizer solution Take 2 ampules by nebulization every 6 (six) hours as needed for wheezing.    [provider]    Scheduled Meds: . budesonide (PULMICORT) nebulizer solution  0.25 mg Nebulization BID  . chlorhexidine gluconate (MEDLINE KIT)  15 mL Mouth Rinse BID  . insulin aspart  0-15 Units Subcutaneous Q4H  . ipratropium-albuterol  3 mL Nebulization Q4H  . mouth rinse  15 mL Mouth  Rinse 10 times per day  . methylPREDNISolone (SOLU-MEDROL) injection  40 mg Intravenous Q12H  . pantoprazole (PROTONIX) IV  40 mg Intravenous Q12H  . sodium chloride flush  3 mL Intravenous Q12H   Continuous Infusions: . famotidine (PEPCID) IV    . fentaNYL infusion INTRAVENOUS 225 mcg/hr (08/16/17 0316)  . lacosamide (VIMPAT) IV Stopped (08/16/17 0330)  . levofloxacin (LEVAQUIN) IV Stopped (08/16/17 0232)  . propofol (DIPRIVAN) infusion 20 mcg/kg/min (08/15/17 2105)   PRN Meds:.acetaminophen **OR** acetaminophen, albuterol, fentaNYL, LORazepam, midazolam, midazolam, ondansetron **OR** ondansetron (ZOFRAN) IV, oxyCODONE, polyethylene glycol  Allergies as of 08/15/2017 - Review Complete 08/15/2017  Allergen Reaction Noted  . Daptomycin  08/15/2017  . Iodinated diagnostic agents  08/15/2017    Family History  Problem Relation Age of Onset  . CAD Neg Hx     Social History   Social History  . Marital status: Divorced    Spouse name: N/A  . Number of children: N/A  . Years of education: N/A   Occupational History  . Not on file.   Social History Main Topics  . Smoking status: Current Every Day Smoker  . Smokeless tobacco: Never Used  . Alcohol use No     Comment: Drank heavily in the past per his cousin  . Drug use: No  . Sexual activity: Not on file   Other Topics Concern  . Not on file   Social History Narrative  . No narrative on file    Review of Systems: Unable to obtain  Physical Exam: Vital signs: Vitals:   08/16/17 1200 08/16/17 1242  BP: 125/62   Pulse: (!) 54   Resp: 13   Temp: 97.6 F (36.4 C)   SpO2: 100% 100%   Last BM Date: 08/15/17 General:  Sedated, intubated, elderly Lungs:  Coarse breath sounds Heart:  Regular rate and rhythm; no murmurs, clicks, rubs,  or gallops. Abdomen: soft, nontender (no grimace), nondistended, +BS  Rectal:  Deferred Ext: no edema  GI:  Lab Results:  Recent Labs  08/15/17 0536 08/15/17 1249   08/16/17 0244 08/16/17 0832 08/16/17 1220  WBC 9.9 15.0*  --   --  12.6*  --   HGB 4.6* 5.4*  < > 7.1* 7.6* 8.1*  HCT 15.6* 17.5*  < > 21.8* 23.3* 24.8*  PLT 590* 553*  --   --  467*  --   < > = values in this interval not displayed. BMET  Recent Labs  08/15/17 0536 08/15/17 1249 08/15/17 1819 08/16/17 0832  NA 137 137  --  136  K 4.9 5.2* 5.4* 4.7  CL 108 109  --  107  CO2 19* 20*  --  23  GLUCOSE 208* 190*  --  108*  BUN 17 20  --  26*  CREATININE 1.11 1.06  --  1.12  CALCIUM  8.5* 8.6*  --  7.9*   LFT  Recent Labs  08/15/17 0536  PROT 6.8  ALBUMIN 3.1*  AST 19  ALT 5*  ALKPHOS 57  BILITOT 0.2*   PT/INR  Recent Labs  08/15/17 0536  LABPROT 16.0*  INR 1.29     Studies/Results: Dg Abd 1 View  Result Date: 08/15/2017 CLINICAL DATA:  OG tube placement EXAM: ABDOMEN - 1 VIEW COMPARISON:  None. FINDINGS: Enteric tube extends into the stomach with tip in the region of the mid gastric body. IMPRESSION: OG tube extends into the stomach. Electronically Signed   By: Andreas Newport M.D.   On: 08/15/2017 23:14   US Venous Img Lower Unilateral Left  Result Date: 08/15/2017 CLINICAL DATA:  Initial evaluation for edema of left lower extremity. EXAM: Left LOWER EXTREMITY VENOUS DOPPLER ULTRASOUND TECHNIQUE: Gray-scale sonography with graded compression, as well as color Doppler and duplex ultrasound were performed to evaluate the lower extremity deep venous systems from the level of the common femoral vein and including the common femoral, femoral, profunda femoral, popliteal and calf veins including the posterior tibial, peroneal and gastrocnemius veins when visible. The superficial great saphenous vein was also interrogated. Spectral Doppler was utilized to evaluate flow at rest and with distal augmentation maneuvers in the common femoral, femoral and popliteal veins. COMPARISON:  None. FINDINGS: Contralateral Common Femoral Vein: Respiratory phasicity is normal and  symmetric with the symptomatic side. No evidence of thrombus. Normal compressibility. Common Femoral Vein: No evidence of thrombus. Normal compressibility, respiratory phasicity and response to augmentation. Saphenofemoral Junction: No evidence of thrombus. Normal compressibility and flow on color Doppler imaging. Profunda Femoral Vein: No evidence of thrombus. Normal compressibility and flow on color Doppler imaging. Femoral Vein: No evidence of thrombus. Normal compressibility, respiratory phasicity and response to augmentation. Popliteal Vein: No evidence of thrombus. Normal compressibility, respiratory phasicity and response to augmentation. Calf Veins: No evidence of thrombus. Normal compressibility and flow on color Doppler imaging. Superficial Great Saphenous Vein: Not visualized. Reportedly, patient status post surgery with possible graft prior gestation. Venous Reflux:  None. Other Findings: Few mildly prominent lymph nodes noted at the left groin measuring up to 2.2 cm in long axis. IMPRESSION: No evidence of DVT within the left lower extremity. Electronically Signed   By: Jeannine Boga M.D.   On: 08/15/2017 20:31   Dg Chest Port 1 View  Result Date: 08/15/2017 CLINICAL DATA:  Central line placement. EXAM: PORTABLE CHEST 1 VIEW 4:43 p.m. COMPARISON:  08/15/2017 at 5:48 a.m. FINDINGS: New left internal jugular catheter has been inserted. The tip is at the junction of the left innominate vein and superior vena cava but directed toward the wall of the SVC. Endotracheal tube tip is 4.5 cm above the carina in good position. The patient has developed faint pulmonary edema in the right mid and lower lung zone. There is slight haziness in the left perihilar region and left lung base, slightly increased. Aortic atherosclerosis. IMPRESSION: 1. New left central venous catheter tip is in the superior vena cava, directed toward the wall. 2. New pulmonary edema on the right. Persistent pulmonary edema on the  left. 3. Aortic atherosclerosis. 4. Endotracheal tube in good position. Electronically Signed   By: Lorriane Shire M.D.   On: 08/15/2017 17:04   Dg Chest Portable 1 View  Result Date: 08/15/2017 CLINICAL DATA:  Diagonal respirations.  Sepsis. EXAM: PORTABLE CHEST 1 VIEW COMPARISON:  05/30/2017 FINDINGS: Patchy airspace opacities in the central and basilar left lung  could represent pneumonia. Probable small left pleural effusion. Right lung is clear. Pulmonary vasculature is normal. Unchanged cardiomegaly. IMPRESSION: Patchy opacities in the central and basilar left lung, with a small left pleural effusion. This could represent pneumonia. Electronically Signed   By: Andreas Newport M.D.   On: 08/15/2017 06:08    Impression/Plan: 71 yo with respiratory arrest and question of a seizure being seen for a heme positive stool and anemia (Hgb. 4.6). Transfused 4 U PRBCs to Hgb 8.1. No visible rectal bleeding per nursing. Would not recommend any invasive GI procedures at this time and would continue IV PPI Q 12 hours. When he stabilizes can consider an outpt EGD/colon if his anemia continues to occur. Will sign off. Call us back if needed.    LOS: 1 day   Reeves C.  08/16/2017, 1:20 PM

## 2017-08-16 NOTE — Progress Notes (Signed)
Patient remains sedated/intubated propofol added for comfort at shift start patient responded well. Family updated by phone see [facility] for further update.

## 2017-08-16 NOTE — Consult Note (Signed)
Reason for Consult:bradycardia history failure congestive heart failure Referring Physician: [facility],  Dr. Manuella Ghazi hospitalist  Blake Villarreal is an 71 y.o. male.  HPI: patient is no. Patient with a history of multiple medical problems smoking obstructive sleep apnea diabetes peripheral vascular disease COPD alcoholism presents with respiratory failure possible seizure with episodes of bradycardia requiring shortcourse of CODE BLUE. Patient now intubated sedated with respiratory failure.  Past Medical History:  Diagnosis Date  . Diabetes mellitus without complication ([place])   . Hypertension   . Intermittent confusion   . PAD (peripheral artery disease) ([place])     Past Surgical History:  Procedure Laterality Date  . ABOVE KNEE LEG AMPUTATION      Family History  Problem Relation Age of Onset  . CAD Neg Hx     Social History:  reports that he has been smoking.  He has never used smokeless tobacco. He reports that he does not drink alcohol or use drugs.  Allergies:  Allergies  Allergen Reactions  . Daptomycin   . Iodinated Diagnostic Agents     Medications: I have reviewed the patient's current medications.  Results for orders placed or performed during the hospital encounter of 08/15/17 (from the past 48 hour(s))  Comprehensive metabolic panel     Status: Abnormal   Collection Time: 08/15/17  5:36 AM  Result Value Ref Range   Sodium 137 135 - 145 mmol/L   Potassium 4.9 3.5 - 5.1 mmol/L   Chloride 108 101 - 111 mmol/L   CO2 19 (L) 22 - 32 mmol/L   Glucose, Bld 208 (H) 65 - 99 mg/dL   BUN 17 6 - 20 mg/dL   Creatinine, Ser 1.11 0.61 - 1.24 mg/dL   Calcium 8.5 (L) 8.9 - 10.3 mg/dL   Total Protein 6.8 6.5 - 8.1 g/dL   Albumin 3.1 (L) 3.5 - 5.0 g/dL   AST 19 15 - 41 U/L   ALT 5 (L) 17 - 63 U/L   Alkaline Phosphatase 57 38 - 126 U/L   Total Bilirubin 0.2 (L) 0.3 - 1.2 mg/dL   GFR calc non Af Amer >60 >60 mL/min   GFR calc Af Amer >60 >60 mL/min    Comment:  (NOTE) The eGFR has been calculated using the CKD EPI equation. This calculation has not been validated in all clinical situations. eGFR's persistently <60 mL/min signify possible Chronic Kidney Disease.    Anion gap 10 5 - 15  CBC with Differential     Status: Abnormal   Collection Time: 08/15/17  5:36 AM  Result Value Ref Range   WBC 9.9 3.8 - 10.6 K/uL   RBC 2.03 (L) 4.40 - 5.90 MIL/uL   Hemoglobin 4.6 (LL) 13.0 - 18.0 g/dL    Comment: CRITICAL RESULT CALLED TO, READ BACK BY AND VERIFIED WITH:  CHRISTINE MARTIN AT 5809 08/15/17 SDR    HCT 15.6 (L) 40.0 - 52.0 %   MCV 76.9 (L) 80.0 - 100.0 fL   MCH 22.8 (L) 26.0 - 34.0 pg   MCHC 29.6 (L) 32.0 - 36.0 g/dL   RDW 21.6 (H) 11.5 - 14.5 %   Platelets 590 (H) 150 - 440 K/uL   Neutrophils Relative % 72 %   Neutro Abs 7.1 (H) 1.4 - 6.5 K/uL   Lymphocytes Relative 15 %   Lymphs Abs 1.5 1.0 - 3.6 K/uL   Monocytes Relative 9 %   Monocytes Absolute 0.9 0.2 - 1.0 K/uL   Eosinophils Relative 3 %  Eosinophils Absolute 0.3 0 - 0.7 K/uL   Basophils Relative 1 %   Basophils Absolute 0.1 0 - 0.1 K/uL  Protime-INR     Status: Abnormal   Collection Time: 08/15/17  5:36 AM  Result Value Ref Range   Prothrombin Time 16.0 (H) 11.4 - 15.2 seconds   INR 1.29   Culture, blood (Routine x 2)     Status: None (Preliminary result)   Collection Time: 08/15/17  5:36 AM  Result Value Ref Range   Specimen Description BLOOD LT AC    Special Requests NONE    Culture NO GROWTH 1 DAY    Report Status PENDING   ABO/Rh     Status: None   Collection Time: 08/15/17  5:36 AM  Result Value Ref Range   ABO/RH(D) O POS   Culture, blood (Routine x 2)     Status: None (Preliminary result)   Collection Time: 08/15/17  5:42 AM  Result Value Ref Range   Specimen Description BLOOD RT HAND    Special Requests      BOTTLES DRAWN AEROBIC AND ANAEROBIC Blood Culture adequate volume   Culture NO GROWTH 1 DAY    Report Status PENDING   Blood gas, venous     Status:  Abnormal   Collection Time: 08/15/17  5:42 AM  Result Value Ref Range   pH, Ven 7.30 7.250 - 7.430   pCO2, Ven 39 (L) 44.0 - 60.0 mmHg   pO2, Ven 33.0 32.0 - 45.0 mmHg   Bicarbonate 19.2 (L) 20.0 - 28.0 mmol/L   Acid-base deficit 6.7 (H) 0.0 - 2.0 mmol/L   O2 Saturation 55.9 %   Patient temperature 37.0    Collection site LEFT ANTECUBITAL    Sample type VENOUS   Lactic acid, plasma     Status: Abnormal   Collection Time: 08/15/17  6:17 AM  Result Value Ref Range   Lactic Acid, Venous 3.6 (HH) 0.5 - 1.9 mmol/L    Comment: CRITICAL RESULT CALLED TO, READ BACK BY AND VERIFIED WITH ANGELA ROBBINS @ 0703 08/15/17 BY [facility]   Type and screen [facility]     Status: None (Preliminary result)   Collection Time: 08/15/17  6:23 AM  Result Value Ref Range   ABO/RH(D) O POS    Antibody Screen NEG    Sample Expiration 08/18/2017    Unit Number [id number]    Blood Component Type RBC, LR IRR    Unit division 00    Status of Unit ISSUED,FINAL    Transfusion Status OK TO TRANSFUSE    Crossmatch Result Compatible    Unit Number [id number]    Blood Component Type RBC LR PHER2    Unit division 00    Status of Unit ISSUED,FINAL    Transfusion Status OK TO TRANSFUSE    Crossmatch Result Compatible    Unit Number [id number]    Blood Component Type RED CELLS,LR    Unit division 00    Status of Unit ISSUED,FINAL    Transfusion Status OK TO TRANSFUSE    Crossmatch Result Compatible    Unit Number [id number]    Blood Component Type RED CELLS,LR    Unit division 00    Status of Unit ISSUED    Transfusion Status OK TO TRANSFUSE    Crossmatch Result Compatible   Prepare RBC     Status: None   Collection Time: 08/15/17  8:30 AM  Result Value Ref Range   Order Confirmation ORDER PROCESSED BY  BLOOD BANK   Lactic acid, plasma     Status: Abnormal   Collection Time: 08/15/17  9:10 AM  Result Value Ref Range   Lactic Acid, Venous 2.7 (HH) 0.5 - 1.9 mmol/L    Comment:  CRITICAL RESULT CALLED TO, READ BACK BY AND VERIFIED WITH AMANDA PEREZ @ 1004 08/15/17 BY [facility]   Glucose, capillary     Status: Abnormal   Collection Time: 08/15/17 11:31 AM  Result Value Ref Range   Glucose-Capillary 171 (H) 65 - 99 mg/dL  Blood gas, arterial     Status: Abnormal   Collection Time: 08/15/17 11:41 AM  Result Value Ref Range   FIO2 1.00    pH, Arterial 7.35 7.350 - 7.450   pCO2 arterial 38 32.0 - 48.0 mmHg   pO2, Arterial 176 (H) 83.0 - 108.0 mmHg   Bicarbonate 21.0 20.0 - 28.0 mmol/L   Acid-base deficit 4.2 (H) 0.0 - 2.0 mmol/L   O2 Saturation 99.5 %   Patient temperature 37.0    Collection site REVIEWED BY    Sample type ARTERIAL DRAW    Allens test (pass/fail) PASS PASS  Glucose, capillary     Status: Abnormal   Collection Time: 08/15/17 12:05 PM  Result Value Ref Range   Glucose-Capillary 206 (H) 65 - 99 mg/dL  Basic metabolic panel     Status: Abnormal   Collection Time: 08/15/17 12:49 PM  Result Value Ref Range   Sodium 137 135 - 145 mmol/L   Potassium 5.2 (H) 3.5 - 5.1 mmol/L   Chloride 109 101 - 111 mmol/L   CO2 20 (L) 22 - 32 mmol/L   Glucose, Bld 190 (H) 65 - 99 mg/dL   BUN 20 6 - 20 mg/dL   Creatinine, Ser 1.06 0.61 - 1.24 mg/dL   Calcium 8.6 (L) 8.9 - 10.3 mg/dL   GFR calc non Af Amer >60 >60 mL/min   GFR calc Af Amer >60 >60 mL/min    Comment: (NOTE) The eGFR has been calculated using the CKD EPI equation. This calculation has not been validated in all clinical situations. eGFR's persistently <60 mL/min signify possible Chronic Kidney Disease.    Anion gap 8 5 - 15  Magnesium     Status: None   Collection Time: 08/15/17 12:49 PM  Result Value Ref Range   Magnesium 1.8 1.7 - 2.4 mg/dL  Phosphorus     Status: None   Collection Time: 08/15/17 12:49 PM  Result Value Ref Range   Phosphorus 3.8 2.5 - 4.6 mg/dL  Troponin I (q 6hr x 3)     Status: None   Collection Time: 08/15/17 12:49 PM  Result Value Ref Range   Troponin I <0.03 <0.03 ng/mL   CBC with Differential/Platelet     Status: Abnormal   Collection Time: 08/15/17 12:49 PM  Result Value Ref Range   WBC 15.0 (H) 3.8 - 10.6 K/uL   RBC 2.26 (L) 4.40 - 5.90 MIL/uL   Hemoglobin 5.4 (L) 13.0 - 18.0 g/dL   HCT 17.5 (L) 40.0 - 52.0 %   MCV 77.1 (L) 80.0 - 100.0 fL   MCH 24.0 (L) 26.0 - 34.0 pg   MCHC 31.1 (L) 32.0 - 36.0 g/dL   RDW 21.2 (H) 11.5 - 14.5 %   Platelets 553 (H) 150 - 440 K/uL   Neutrophils Relative % 92 %   Neutro Abs 13.8 (H) 1.4 - 6.5 K/uL   Lymphocytes Relative 7 %   Lymphs Abs 1.0 1.0 -  3.6 K/uL   Monocytes Relative 1 %   Monocytes Absolute 0.2 0.2 - 1.0 K/uL   Eosinophils Relative 0 %   Eosinophils Absolute 0.0 0 - 0.7 K/uL   Basophils Relative 0 %   Basophils Absolute 0.1 0 - 0.1 K/uL  Procalcitonin - Baseline     Status: None   Collection Time: 08/15/17 12:49 PM  Result Value Ref Range   Procalcitonin <0.10 ng/mL    Comment:        Interpretation: PCT (Procalcitonin) <= 0.5 ng/mL: Systemic infection (sepsis) is not likely. Local bacterial infection is possible. (NOTE)         ICU PCT Algorithm               Non ICU PCT Algorithm    ----------------------------     ------------------------------         PCT < 0.25 ng/mL                 PCT < 0.1 ng/mL     Stopping of antibiotics            Stopping of antibiotics       strongly encouraged.               strongly encouraged.    ----------------------------     ------------------------------       PCT level decrease by               PCT < 0.25 ng/mL       >= 80% from peak PCT       OR PCT 0.25 - 0.5 ng/mL          Stopping of antibiotics                                             encouraged.     Stopping of antibiotics           encouraged.    ----------------------------     ------------------------------       PCT level decrease by              PCT >= 0.25 ng/mL       < 80% from peak PCT        AND PCT >= 0.5 ng/mL            Continuin g antibiotics                                               encouraged.       Continuing antibiotics            encouraged.    ----------------------------     ------------------------------     PCT level increase compared          PCT > 0.5 ng/mL         with peak PCT AND          PCT >= 0.5 ng/mL             Escalation of antibiotics  strongly encouraged.      Escalation of antibiotics        strongly encouraged.   Prepare RBC     Status: None   Collection Time: 08/15/17  2:00 PM  Result Value Ref Range   Order Confirmation ORDER PROCESSED BY BLOOD BANK   Glucose, capillary     Status: Abnormal   Collection Time: 08/15/17  2:47 PM  Result Value Ref Range   Glucose-Capillary 228 (H) 65 - 99 mg/dL  Glucose, capillary     Status: Abnormal   Collection Time: 08/15/17  5:03 PM  Result Value Ref Range   Glucose-Capillary 231 (H) 65 - 99 mg/dL  Blood gas, arterial     Status: Abnormal   Collection Time: 08/15/17  5:55 PM  Result Value Ref Range   FIO2 0.40    Mode PRESSURE REGULATED VOLUME CONTROL    VT 500 mL   LHR 16 resp/min   pH, Arterial 7.30 (L) 7.350 - 7.450   pCO2 arterial 47 32.0 - 48.0 mmHg   pO2, Arterial 54 (L) 83.0 - 108.0 mmHg   Bicarbonate 23.1 20.0 - 28.0 mmol/L   Acid-base deficit 3.3 (H) 0.0 - 2.0 mmol/L   O2 Saturation 83.8 %   Patient temperature 37.0    Collection site REVIEWED BY    Sample type ARTERIAL DRAW    Allens test (pass/fail) PASS PASS  Troponin I (q 6hr x 3)     Status: Abnormal   Collection Time: 08/15/17  6:19 PM  Result Value Ref Range   Troponin I 0.08 (HH) <0.03 ng/mL    Comment: CRITICAL RESULT CALLED TO, READ BACK BY AND VERIFIED WITH STACI COLLIE AT 1859 ON 08/15/17 RWW   Potassium     Status: Abnormal   Collection Time: 08/15/17  6:19 PM  Result Value Ref Range   Potassium 5.4 (H) 3.5 - 5.1 mmol/L  Hemoglobin and hematocrit, blood     Status: Abnormal   Collection Time: 08/15/17  7:29 PM  Result Value Ref Range   Hemoglobin 6.9 (L) 13.0 -  18.0 g/dL    Comment: RESULT REPEATED AND VERIFIED   HCT 22.3 (L) 40.0 - 52.0 %  Triglycerides     Status: None   Collection Time: 08/15/17  7:29 PM  Result Value Ref Range   Triglycerides 65 <150 mg/dL  Glucose, capillary     Status: Abnormal   Collection Time: 08/15/17  7:47 PM  Result Value Ref Range   Glucose-Capillary 187 (H) 65 - 99 mg/dL   Comment 1 Notify RN    Comment 2 Document in Chart   MRSA PCR Screening     Status: Abnormal   Collection Time: 08/15/17  8:48 PM  Result Value Ref Range   MRSA by PCR POSITIVE (A) NEGATIVE    Comment:        The GeneXpert MRSA Assay (FDA approved for NASAL specimens only), is one component of a comprehensive MRSA colonization surveillance program. It is not intended to diagnose MRSA infection nor to guide or monitor treatment for MRSA infections. RESULT CALLED TO, READ BACK BY AND VERIFIED WITH: ERICA TAYLOR AT 2206 08/15/17.PMH   Glucose, capillary     Status: Abnormal   Collection Time: 08/15/17 11:58 PM  Result Value Ref Range   Glucose-Capillary 126 (H) 65 - 99 mg/dL  Troponin I (q 6hr x 3)     Status: Abnormal   Collection Time: 08/16/17  2:44 AM  Result Value Ref Range  Troponin I 0.32 (HH) <0.03 ng/mL    Comment: CRITICAL VALUE NOTED. VALUE IS CONSISTENT WITH PREVIOUSLY REPORTED/CALLED VALUE RWW   Urinalysis, Complete w Microscopic     Status: Abnormal   Collection Time: 08/16/17  2:44 AM  Result Value Ref Range   Color, Urine YELLOW (A) YELLOW   APPearance CLEAR (A) CLEAR   Specific Gravity, Urine 1.013 1.005 - 1.030   pH 5.0 5.0 - 8.0   Glucose, UA NEGATIVE NEGATIVE mg/dL   Hgb urine dipstick SMALL (A) NEGATIVE   Bilirubin Urine NEGATIVE NEGATIVE   Ketones, ur NEGATIVE NEGATIVE mg/dL   Protein, ur NEGATIVE NEGATIVE mg/dL   Nitrite NEGATIVE NEGATIVE   Leukocytes, UA MODERATE (A) NEGATIVE   RBC / HPF 6-30 0 - 5 RBC/hpf   WBC, UA 6-30 0 - 5 WBC/hpf   Bacteria, UA NONE SEEN NONE SEEN   Squamous Epithelial / LPF  NONE SEEN NONE SEEN   Mucus PRESENT    Hyaline Casts, UA PRESENT   Hemoglobin and hematocrit, blood     Status: Abnormal   Collection Time: 08/16/17  2:44 AM  Result Value Ref Range   Hemoglobin 7.1 (L) 13.0 - 18.0 g/dL   HCT 21.8 (L) 40.0 - 52.0 %  Glucose, capillary     Status: Abnormal   Collection Time: 08/16/17  3:55 AM  Result Value Ref Range   Glucose-Capillary 123 (H) 65 - 99 mg/dL   Comment 1 Notify RN    Comment 2 Document in Chart   Prepare RBC     Status: None   Collection Time: 08/16/17  4:30 AM  Result Value Ref Range   Order Confirmation ORDER PROCESSED BY BLOOD BANK   Prepare RBC     Status: None   Collection Time: 08/16/17  4:30 AM  Result Value Ref Range   Order Confirmation ORDER PROCESSED BY BLOOD BANK   Glucose, capillary     Status: Abnormal   Collection Time: 08/16/17  7:22 AM  Result Value Ref Range   Glucose-Capillary 113 (H) 65 - 99 mg/dL   Comment 1 Notify RN   Basic metabolic panel     Status: Abnormal   Collection Time: 08/16/17  8:32 AM  Result Value Ref Range   Sodium 136 135 - 145 mmol/L   Potassium 4.7 3.5 - 5.1 mmol/L   Chloride 107 101 - 111 mmol/L   CO2 23 22 - 32 mmol/L   Glucose, Bld 108 (H) 65 - 99 mg/dL   BUN 26 (H) 6 - 20 mg/dL   Creatinine, Ser 1.12 0.61 - 1.24 mg/dL   Calcium 7.9 (L) 8.9 - 10.3 mg/dL   GFR calc non Af Amer >60 >60 mL/min   GFR calc Af Amer >60 >60 mL/min    Comment: (NOTE) The eGFR has been calculated using the CKD EPI equation. This calculation has not been validated in all clinical situations. eGFR's persistently <60 mL/min signify possible Chronic Kidney Disease.    Anion gap 6 5 - 15  CBC     Status: Abnormal   Collection Time: 08/16/17  8:32 AM  Result Value Ref Range   WBC 12.6 (H) 3.8 - 10.6 K/uL   RBC 2.90 (L) 4.40 - 5.90 MIL/uL   Hemoglobin 7.6 (L) 13.0 - 18.0 g/dL   HCT 23.3 (L) 40.0 - 52.0 %   MCV 80.4 80.0 - 100.0 fL   MCH 26.3 26.0 - 34.0 pg   MCHC 32.8 32.0 - 36.0 g/dL   RDW  18.7 (H)  11.5 - 14.5 %   Platelets 467 (H) 150 - 440 K/uL  Lactic acid, plasma     Status: None   Collection Time: 08/16/17  8:32 AM  Result Value Ref Range   Lactic Acid, Venous 0.9 0.5 - 1.9 mmol/L  Blood gas, arterial     Status: Abnormal   Collection Time: 08/16/17 11:00 AM  Result Value Ref Range   FIO2 0.35    Mode PRESSURE REGULATED VOLUME CONTROL    VT 500 mL   LHR 16 resp/min   Peep/cpap 5.0 cm H20   pH, Arterial 7.29 (L) 7.350 - 7.450   pCO2 arterial 47 32.0 - 48.0 mmHg   pO2, Arterial 108 83.0 - 108.0 mmHg   Bicarbonate 22.6 20.0 - 28.0 mmol/L   Acid-base deficit 4.0 (H) 0.0 - 2.0 mmol/L   O2 Saturation 97.6 %   Patient temperature 37.0    Collection site REVIEWED BY    Sample type ARTERIAL DRAW    Allens test (pass/fail) PASS PASS  Glucose, capillary     Status: Abnormal   Collection Time: 08/16/17 12:12 PM  Result Value Ref Range   Glucose-Capillary 120 (H) 65 - 99 mg/dL   Comment 1 Notify RN   Hemoglobin and hematocrit, blood     Status: Abnormal   Collection Time: 08/16/17 12:20 PM  Result Value Ref Range   Hemoglobin 8.1 (L) 13.0 - 18.0 g/dL   HCT 24.8 (L) 40.0 - 52.0 %    Dg Abd 1 View  Result Date: 08/15/2017 CLINICAL DATA:  OG tube placement EXAM: ABDOMEN - 1 VIEW COMPARISON:  None. FINDINGS: Enteric tube extends into the stomach with tip in the region of the mid gastric body. IMPRESSION: OG tube extends into the stomach. Electronically Signed   By: Andreas Newport M.D.   On: 08/15/2017 23:14   US Venous Img Lower Unilateral Left  Result Date: 08/15/2017 CLINICAL DATA:  Initial evaluation for edema of left lower extremity. EXAM: Left LOWER EXTREMITY VENOUS DOPPLER ULTRASOUND TECHNIQUE: Gray-scale sonography with graded compression, as well as color Doppler and duplex ultrasound were performed to evaluate the lower extremity deep venous systems from the level of the common femoral vein and including the common femoral, femoral, profunda femoral, popliteal and calf  veins including the posterior tibial, peroneal and gastrocnemius veins when visible. The superficial great saphenous vein was also interrogated. Spectral Doppler was utilized to evaluate flow at rest and with distal augmentation maneuvers in the common femoral, femoral and popliteal veins. COMPARISON:  None. FINDINGS: Contralateral Common Femoral Vein: Respiratory phasicity is normal and symmetric with the symptomatic side. No evidence of thrombus. Normal compressibility. Common Femoral Vein: No evidence of thrombus. Normal compressibility, respiratory phasicity and response to augmentation. Saphenofemoral Junction: No evidence of thrombus. Normal compressibility and flow on color Doppler imaging. Profunda Femoral Vein: No evidence of thrombus. Normal compressibility and flow on color Doppler imaging. Femoral Vein: No evidence of thrombus. Normal compressibility, respiratory phasicity and response to augmentation. Popliteal Vein: No evidence of thrombus. Normal compressibility, respiratory phasicity and response to augmentation. Calf Veins: No evidence of thrombus. Normal compressibility and flow on color Doppler imaging. Superficial Great Saphenous Vein: Not visualized. Reportedly, patient status post surgery with possible graft prior gestation. Venous Reflux:  None. Other Findings: Few mildly prominent lymph nodes noted at the left groin measuring up to 2.2 cm in long axis. IMPRESSION: No evidence of DVT within the left lower extremity. Electronically Signed   By: Marland Kitchen  Jeannine Boga M.D.   On: 08/15/2017 20:31   Dg Chest Port 1 View  Result Date: 08/16/2017 CLINICAL DATA:  Intubated, CPR EXAM: PORTABLE CHEST 1 VIEW COMPARISON:  08/15/2017 FINDINGS: Endotracheal tube 4.6 cm above the carina. Left IJ central line tip at the innominate venous confluence, unchanged. NG tube enters the stomach with the tip not visualized. Cardiomegaly evident with vascular congestion versus mild edema. Suspect small pleural  effusions layering posteriorly with associated basilar atelectasis. No new focal pneumonia, collapse or consolidation.  No pneumothorax. IMPRESSION: Stable support apparatus. Cardiomegaly with mild edema pattern and trace pleural effusions Minor basilar atelectasis No significant change compared to yesterday Electronically Signed   By: Jerilynn Mages.  Shick M.D.   On: 08/16/2017 13:44   Dg Chest Port 1 View  Result Date: 08/15/2017 CLINICAL DATA:  Central line placement. EXAM: PORTABLE CHEST 1 VIEW 4:43 p.m. COMPARISON:  08/15/2017 at 5:48 a.m. FINDINGS: New left internal jugular catheter has been inserted. The tip is at the junction of the left innominate vein and superior vena cava but directed toward the wall of the SVC. Endotracheal tube tip is 4.5 cm above the carina in good position. The patient has developed faint pulmonary edema in the right mid and lower lung zone. There is slight haziness in the left perihilar region and left lung base, slightly increased. Aortic atherosclerosis. IMPRESSION: 1. New left central venous catheter tip is in the superior vena cava, directed toward the wall. 2. New pulmonary edema on the right. Persistent pulmonary edema on the left. 3. Aortic atherosclerosis. 4. Endotracheal tube in good position. Electronically Signed   By: Lorriane Shire M.D.   On: 08/15/2017 17:04   Dg Chest Portable 1 View  Result Date: 08/15/2017 CLINICAL DATA:  Diagonal respirations.  Sepsis. EXAM: PORTABLE CHEST 1 VIEW COMPARISON:  05/30/2017 FINDINGS: Patchy airspace opacities in the central and basilar left lung could represent pneumonia. Probable small left pleural effusion. Right lung is clear. Pulmonary vasculature is normal. Unchanged cardiomegaly. IMPRESSION: Patchy opacities in the central and basilar left lung, with a small left pleural effusion. This could represent pneumonia. Electronically Signed   By: Andreas Newport M.D.   On: 08/15/2017 06:08    Review of Systems  Unable to perform ROS:  Intubated   Blood pressure 124/63, pulse (!) 59, temperature 97.6 F (36.4 C), resp. rate 12, height _0  (1.753 m), weight 79.5 kg (175 lb 4.3 oz), SpO2 100 %. Physical Exam  Nursing note and vitals reviewed. Constitutional: He appears well-developed.  HENT:  Head: Normocephalic and atraumatic.  Eyes: EOM are normal.  Neck: Normal range of motion. Neck supple.  Cardiovascular: S1 normal and S2 normal.  An irregularly irregular rhythm present. Frequent extrasystoles are present. Bradycardia present.  Exam reveals decreased pulses.   Murmur heard. Musculoskeletal:  AKA on the right Significant peripheral vascular disease surgery on the left  Neurological:  Intubated sedated  Skin: Skin is warm and dry.  Psychiatric:  Intubated sedated    Assessment/Plan: Respiratory failure Possible seizures activity Obstructive sleep apnea COPD Smoking Peripheral vascular disease Diabetes Coronary disease Congestive heart failure Bradycardia  alcohol abuse . Plan Continue critical care support Continue ventilatory support and care by critical care pulmonary Continue sedation Recommend GI consult for significant anemia Severe peripheral vascular disease Do not recommend temporary permanent pacemaker Bradycardia probably related to respiratory problem Echocardiogram for assessment of left ventricular function  Dwayne D Callwood 08/16/2017, 4:26 PM

## 2017-08-16 NOTE — Progress Notes (Signed)
[employer] - [place] at [facility]   PATIENT NAME: Blake Villarreal    MR#:  [id number]  DATE OF BIRTH:  11/01/1946  SUBJECTIVE:  CHIEF COMPLAINT:   Chief Complaint  Patient presents with  . Shortness of Breath  remains sedated, intubated. On full vent control REVIEW OF SYSTEMS:    Review of Systems  Unable to perform ROS: Mental status change   DRUG ALLERGIES:   Allergies  Allergen Reactions  . Daptomycin   . Iodinated Diagnostic Agents     VITALS:  Blood pressure 125/62, pulse (!) 54, temperature 97.6 F (36.4 C), resp. rate 13, height 5\' 9"  (1.753 m), weight 79.5 kg (175 lb 4.3 oz), SpO2 100 %.  PHYSICAL EXAMINATION:   Physical Exam  GENERAL:  71 y.o.-year-old patient lying in the bed, intubated EYES: Pupils equal, round, reactive to light  HEENT: Head atraumatic, normocephalic. Oropharynx and nasopharynx clear.  LUNGS: Coarse breath sounds with wheezing CARDIOVASCULAR: S1, S2 normal ABDOMEN: Soft, nontender, nondistended.  EXTREMITIES: No cyanosis. Right below-knee amputation NEUROLOGIC: Sedated and non responsive to stimulus PSYCHIATRIC: unable to assess LABORATORY PANEL:   CBC  Recent Labs Lab 08/16/17 0832 08/16/17 1220  WBC 12.6*  --   HGB 7.6* 8.1*  HCT 23.3* 24.8*  PLT 467*  --    ------------------------------------------------------------------------------------------------------------------ Chemistries   Recent Labs Lab 08/15/17 0536 08/15/17 1249  08/16/17 0832  NA 137 137  --  136  K 4.9 5.2*  < > 4.7  CL 108 109  --  107  CO2 19* 20*  --  23  GLUCOSE 208* 190*  --  108*  BUN 17 20  --  26*  CREATININE 1.11 1.06  --  1.12  CALCIUM 8.5* 8.6*  --  7.9*  MG  --  1.8  --   --   AST 19  --   --   --   ALT 5*  --   --   --   ALKPHOS 57  --   --   --   BILITOT 0.2*  --   --   --   < > = values in this interval not  displayed. ------------------------------------------------------------------------------------------------------------------  Cardiac Enzymes  Recent Labs Lab 08/16/17 0244  TROPONINI 0.32*   ------------------------------------------------------------------------------------------------------------------  RADIOLOGY:  Dg Abd 1 View  Result Date: 08/15/2017 CLINICAL DATA:  OG tube placement EXAM: ABDOMEN - 1 VIEW COMPARISON:  None. FINDINGS: Enteric tube extends into the stomach with tip in the region of the mid gastric body. IMPRESSION: OG tube extends into the stomach. Electronically Signed   By: Ellery Plunkaniel R Mitchell M.D.   On: 08/15/2017 23:14   [place] Venous Img Lower Unilateral Left  Result Date: 08/15/2017 CLINICAL DATA:  Initial evaluation for edema of left lower extremity. EXAM: Left LOWER EXTREMITY VENOUS DOPPLER ULTRASOUND TECHNIQUE: Gray-scale sonography with graded compression, as well as color Doppler and duplex ultrasound were performed to evaluate the lower extremity deep venous systems from the level of the common femoral vein and including the common femoral, femoral, profunda femoral, popliteal and calf veins including the posterior tibial, peroneal and gastrocnemius veins when visible. The superficial great saphenous vein was also interrogated. Spectral Doppler was utilized to evaluate flow at rest and with distal augmentation maneuvers in the common femoral, femoral and popliteal veins. COMPARISON:  None. FINDINGS: Contralateral Common Femoral Vein: Respiratory phasicity is normal and symmetric with the symptomatic side. No evidence of thrombus. Normal compressibility. Common Femoral Vein: No evidence of thrombus. Normal compressibility,  respiratory phasicity and response to augmentation. Saphenofemoral Junction: No evidence of thrombus. Normal compressibility and flow on color Doppler imaging. Profunda Femoral Vein: No evidence of thrombus. Normal compressibility and flow on color  Doppler imaging. Femoral Vein: No evidence of thrombus. Normal compressibility, respiratory phasicity and response to augmentation. Popliteal Vein: No evidence of thrombus. Normal compressibility, respiratory phasicity and response to augmentation. Calf Veins: No evidence of thrombus. Normal compressibility and flow on color Doppler imaging. Superficial Great Saphenous Vein: Not visualized. Reportedly, patient status post surgery with possible graft prior gestation. Venous Reflux:  None. Other Findings: Few mildly prominent lymph nodes noted at the left groin measuring up to 2.2 cm in long axis. IMPRESSION: No evidence of DVT within the left lower extremity. Electronically Signed   By: Rise Mu M.D.   On: 08/15/2017 20:31   Dg Chest Port 1 View  Result Date: 08/15/2017 CLINICAL DATA:  Central line placement. EXAM: PORTABLE CHEST 1 VIEW 4:43 p.m. COMPARISON:  08/15/2017 at 5:48 a.m. FINDINGS: New left internal jugular catheter has been inserted. The tip is at the junction of the left innominate vein and superior vena cava but directed toward the wall of the SVC. Endotracheal tube tip is 4.5 cm above the carina in good position. The patient has developed faint pulmonary edema in the right mid and lower lung zone. There is slight haziness in the left perihilar region and left lung base, slightly increased. Aortic atherosclerosis. IMPRESSION: 1. New left central venous catheter tip is in the superior vena cava, directed toward the wall. 2. New pulmonary edema on the right. Persistent pulmonary edema on the left. 3. Aortic atherosclerosis. 4. Endotracheal tube in good position. Electronically Signed   By: Francene Boyers M.D.   On: 08/15/2017 17:04   Dg Chest Portable 1 View  Result Date: 08/15/2017 CLINICAL DATA:  Diagonal respirations.  Sepsis. EXAM: PORTABLE CHEST 1 VIEW COMPARISON:  05/30/2017 FINDINGS: Patchy airspace opacities in the central and basilar left lung could represent pneumonia.  Probable small left pleural effusion. Right lung is clear. Pulmonary vasculature is normal. Unchanged cardiomegaly. IMPRESSION: Patchy opacities in the central and basilar left lung, with a small left pleural effusion. This could represent pneumonia. Electronically Signed   By: Ellery Plunk M.D.   On: 08/15/2017 06:08   ASSESSMENT AND PLAN:  71 year old male admitted after an episode of unresponsiveness as his facility.  Has had multiple since admission.  Seem to start with SOB then cardiac arrhythmia.  Unclear if this is a primary cardiac versus primary neurological event.  Patient with high potassium, low hemoglobin and PNA  * Acute metabolic encephalopathy - multifactorial  * New onset seizure - EEG negative for seizures or epileptiform activity. - continue Vimpat 100mg  q 12 hours - Seizure precautions - CT/MRI head per neuro - d/w nursing to check UDS  * GI bleed with acute blood loss anemia - s/p 1 unit of blood transfusion - Hb 8.1 - Appreciate GI input, recommend conservative mgmt for now  * Acute COPD exacerbation. On steroids and scheduled nebulizers.  * Acute hypoxic respiratory failure - on full vent support - mgmt per Intensivist  * Cardiac Arrest etiology unknown- rhythm asystole - cardio seen and following   All the records are reviewed and case discussed with Care Management/Social Worker Management plans discussed with the patient, NURSING, cardio and they are in agreement.  CODE STATUS: FULL CODE  DVT Prophylaxis: SCDs  TOTAL TIME TAKING CARE OF THIS PATIENT: 35 minutes.  Delfino Lovett M.D on 08/16/2017 at 1:42 PM  Between 7am to 6pm - Pager - [phone]  After 6pm go to [email or web address] - password [email or web address] [facility]  [facility] [place] [facility]  Office  [phone]  CC: Primary care physician; Keane Police, MD  Note: This dictation was prepared with Dragon dictation along with smaller phrase technology. Any transcriptional errors that result  from this process are unintentional.

## 2017-08-16 NOTE — Progress Notes (Signed)
Interval History: Patient remains intubated and sedated. 2D echo completed. Developed new pulmonary edema on right, persistent on the left based on CXR yesterday, pending CXR today, [place] left LE without DVT, CT head pending, family at bedside. Discussed with family at this point unclear if seizure, EEG was negative for seizures or epileptiform activity, will await CT head results.  Reason for Consult:Seizure Referring Physician: Sudini  CC: Seizure  HPI: Blake Villarreal is an 71 y.o. male with multiple medical problems noted to have agonal breathing at [facility].  Patient was found to be breathing 3-5 times a minute. EMS was called. He was bagged briefly and put on a CPAP and brought to the emergency room. Patient slowly woke up. He does not remember the events at [facility].  Was found to have PNA, anemia with hgb of 4.6 and heme positive stools.  Patient was admitted and after arriving to the floor had an episode when patient was noted to be unresponsive with HR in the 40's and hypoxic.  Noted to have some drawing of his limbs.  Felt to be seizure and patient ordered to have [place].  Patient recovered and was brought to the ICU where during my evaluation complained continuously of having difficulty breathing despite adequate saturation.  After leaving the room patient became asystolic and unresponsive.  No tonic-clonic activity noted.  With no quick recovery patient given Atropine and bagged.  There was then recovery of HR, saturations and patient slowly became more responsive.  Patient amnestic of events.    Past Medical History:  Diagnosis Date  . Diabetes mellitus without complication ([place])   . Hypertension   . Intermittent confusion   . PAD (peripheral artery disease) ([place])     Past Surgical History:  Procedure Laterality Date  . ABOVE KNEE LEG AMPUTATION      Family History  Problem Relation Age of Onset  . CAD Neg Hx     Social History:  reports that he has been smoking.   He has never used smokeless tobacco. He reports that he does not drink alcohol or use drugs.  Allergies  Allergen Reactions  . Daptomycin   . Iodinated Diagnostic Agents     Medications:  I have reviewed the patient's current medications. Prior to Admission:  Prescriptions Prior to Admission  Medication Sig Dispense Refill Last Dose  . amLODipine (NORVASC) 10 MG tablet Take 10 mg by mouth daily.   08/14/2017 at 0900  . aspirin 81 MG chewable tablet Chew 81 mg by mouth daily.   08/14/2017 at 0900  . atorvastatin (LIPITOR) 20 MG tablet Take 20 mg by mouth daily.   08/14/2017 at 2100  . carvedilol (COREG) 12.5 MG tablet Take 37.5 mg by mouth 2 (two) times daily with a meal.   08/14/2017 at 2100  . Cholecalciferol (VITAMIN D3) 2000 units TABS Take 1 tablet by mouth daily.   08/14/2017 at 0900  . clopidogrel (PLAVIX) 75 MG tablet Take 75 mg by mouth daily.   08/14/2017 at 0900  . docusate sodium (COLACE) 100 MG capsule Take 100 mg by mouth daily as needed.    prn at prn  . doxycycline (ADOXA) 100 MG tablet Take 100 mg by mouth 2 (two) times daily.   08/14/2017 at 2100  . doxycycline (DORYX) 100 MG EC tablet Take 100 mg by mouth 2 (two) times daily.   08/14/2017 at 2100  . gabapentin (NEURONTIN) 100 MG capsule Take 100 mg by mouth 3 (three)  times daily.   08/14/2017 at 2100  . hydrocerin (EUCERIN) CREA Apply 1 application topically 2 (two) times daily.   08/14/2017 at 1600  . insulin glargine (LANTUS) 100 UNIT/ML injection Inject 5 Units into the skin at bedtime.    08/14/2017 at 2100  . insulin regular (NOVOLIN R,HUMULIN R) 100 units/mL injection Inject 4-14 Units into the skin 3 (three) times daily before meals. 0-149 = 0 units, 150-200 = 4 units, 201-250 = 7 units, 251-300 = 9 units, 301-350 = 12 units, 351-400 = 14 units, call MD > 400.   08/14/2017 at 1200  . lisinopril (PRINIVIL,ZESTRIL) 40 MG tablet Take 40 mg by mouth daily.   08/14/2017 at 0900  . Magnesium 400 MG TABS Take 400 mg by mouth daily.    08/14/2017 at 0900  . magnesium oxide (MAG-OX) 400 MG tablet Take 400 mg by mouth daily.    08/14/2017 at 0900  . metFORMIN (GLUCOPHAGE) 500 MG tablet Take 500 mg by mouth 2 (two) times daily with a meal.   08/14/2017 at 2100  . mirtazapine (REMERON) 7.5 MG tablet Take 7.5 mg by mouth at bedtime.   08/14/2017 at 2100  . omeprazole (PRILOSEC) 20 MG capsule Take 20 mg by mouth daily.   08/14/2017 at 0600  . terazosin (HYTRIN) 5 MG capsule Take 5 mg by mouth at bedtime.   08/14/2017 at 2100  . vitamin B-12 (CYANOCOBALAMIN) 1000 MCG tablet Take 1,000 mcg by mouth daily.   08/14/2017 at 0900  . acetaminophen (TYLENOL) 325 MG tablet Take 650 mg by mouth every 6 (six) hours as needed.   prn at prn  . albuterol (PROVENTIL) (2.5 MG/3ML) 0.083% nebulizer solution Take 2 ampules by nebulization every 6 (six) hours as needed for wheezing.   prn at prn   Scheduled: . budesonide (PULMICORT) nebulizer solution  0.25 mg Nebulization BID  . chlorhexidine gluconate (MEDLINE KIT)  15 mL Mouth Rinse BID  . insulin aspart  0-15 Units Subcutaneous Q4H  . ipratropium-albuterol  3 mL Nebulization Q4H  . mouth rinse  15 mL Mouth Rinse 10 times per day  . methylPREDNISolone (SOLU-MEDROL) injection  40 mg Intravenous Q12H  . pantoprazole (PROTONIX) IV  40 mg Intravenous Q12H  . sodium chloride flush  3 mL Intravenous Q12H    ROS: History obtained from the patient  General ROS: negative for - chills, fatigue, fever, night sweats, weight gain or weight loss Psychological ROS: negative for - behavioral disorder, hallucinations, memory difficulties, mood swings or suicidal ideation Ophthalmic ROS: negative for - blurry vision, double vision, eye pain or loss of vision ENT ROS: negative for - epistaxis, nasal discharge, oral lesions, sore throat, tinnitus or vertigo Allergy and Immunology ROS: negative for - hives or itchy/watery eyes Hematological and Lymphatic ROS: negative for - bleeding problems, bruising or swollen lymph  nodes Endocrine ROS: negative for - galactorrhea, hair pattern changes, polydipsia/polyuria or temperature intolerance Respiratory ROS: shortness of breath Cardiovascular ROS: negative for - chest pain, dyspnea on exertion, edema or irregular heartbeat Gastrointestinal ROS: negative for - abdominal pain, diarrhea, hematemesis, nausea/vomiting or stool incontinence Genito-Urinary ROS: negative for - dysuria, hematuria, incontinence or urinary frequency/urgency Musculoskeletal ROS: back pain Neurological ROS: as noted in HPI Dermatological ROS: negative for rash and skin lesion changes  Physical Examination: Blood pressure (!) 117/59, pulse (!) 50, temperature 97.8 F (36.6 C), temperature source Oral, resp. rate 13, height 5' 9"  (1.753 m), weight 175 lb 4.3 oz (79.5 kg), SpO2 100 %.  HEENT-  Normocephalic, no lesions, without obvious abnormality.   Cardiovascular- S1, S2 normal, pulses palpable throughout   Lungs- chest clear, no wheezing, rales, normal symmetric air entry Musculoskeletal-Right AKA Skin-vascular skin changes in the LLE  Neurological Examination   Mental Status: Intubated and sedated Cranial Nerves: II: Discs flat bilaterally;  pupils equal, round, reactive to light and accommodation III,IV, VI: Conjugate midline gaze V,VII: face appears symmetric VIII: unable to test IX,X: gag reflex present XI: Unable to test XII: midline tongue Motor: Sedated and non responsive to stim  Tone and bulk:normal tone throughout; no atrophy noted  Deep Tendon Reflexes: 1+ in the upper extremities and absent in the left lower extremity Plantars: Right: AKA   Left: mute Cerebellar: Unable to test Gait: not tested due to AKA   Laboratory Studies:   Basic Metabolic Panel:  Recent Labs Lab 08/15/17 0536 08/15/17 1249 08/15/17 1819 08/16/17 0832  NA 137 137  --  136  K 4.9 5.2* 5.4* 4.7  CL 108 109  --  107  CO2 19* 20*  --  23  GLUCOSE 208* 190*  --  108*  BUN 17 20   --  26*  CREATININE 1.11 1.06  --  1.12  CALCIUM 8.5* 8.6*  --  7.9*  MG  --  1.8  --   --   PHOS  --  3.8  --   --     Liver Function Tests:  Recent Labs Lab 08/15/17 0536  AST 19  ALT 5*  ALKPHOS 57  BILITOT 0.2*  PROT 6.8  ALBUMIN 3.1*   No results for input(s): LIPASE, AMYLASE in the last 168 hours. No results for input(s): AMMONIA in the last 168 hours.  CBC:  Recent Labs Lab 08/15/17 0536 08/15/17 1249 08/15/17 1929 08/16/17 0244 08/16/17 0832  WBC 9.9 15.0*  --   --  12.6*  NEUTROABS 7.1* 13.8*  --   --   --   HGB 4.6* 5.4* 6.9* 7.1* 7.6*  HCT 15.6* 17.5* 22.3* 21.8* 23.3*  MCV 76.9* 77.1*  --   --  80.4  PLT 590* 553*  --   --  467*    Cardiac Enzymes:  Recent Labs Lab 08/15/17 1249 08/15/17 1819 08/16/17 0244  TROPONINI <0.03 0.08* 0.32*    BNP: Invalid input(s): POCBNP  CBG:  Recent Labs Lab 08/15/17 1703 08/15/17 1947 08/15/17 2358 08/16/17 0355 08/16/17 0722  GLUCAP 231* 187* 126* 123* 113*    Microbiology: Results for orders placed or performed during the hospital encounter of 08/15/17  Culture, blood (Routine x 2)     Status: None (Preliminary result)   Collection Time: 08/15/17  5:36 AM  Result Value Ref Range Status   Specimen Description BLOOD LT [facility]  Final   Special Requests NONE  Final   Culture NO GROWTH 1 DAY  Final   Report Status PENDING  Incomplete  Culture, blood (Routine x 2)     Status: None (Preliminary result)   Collection Time: 08/15/17  5:42 AM  Result Value Ref Range Status   Specimen Description BLOOD RT HAND  Final   Special Requests   Final    BOTTLES DRAWN AEROBIC AND ANAEROBIC Blood Culture adequate volume   Culture NO GROWTH 1 DAY  Final   Report Status PENDING  Incomplete  MRSA PCR Screening     Status: Abnormal   Collection Time: 08/15/17  8:48 PM  Result Value Ref Range Status   MRSA by PCR POSITIVE (  A) NEGATIVE Final    Comment:        The GeneXpert MRSA Assay (FDA approved for NASAL  specimens only), is one component of a comprehensive MRSA colonization surveillance program. It is not intended to diagnose MRSA infection nor to guide or monitor treatment for MRSA infections. RESULT CALLED TO, READ BACK BY AND VERIFIED WITH: ERICA TAYLOR AT 2206 08/15/17.PMH     Coagulation Studies:  Recent Labs  08/15/17 0536  LABPROT 16.0*  INR 1.29    Urinalysis:   Recent Labs Lab 08/16/17 0244  COLORURINE YELLOW*  LABSPEC 1.013  PHURINE 5.0  GLUCOSEU NEGATIVE  HGBUR SMALL*  BILIRUBINUR NEGATIVE  KETONESUR NEGATIVE  PROTEINUR NEGATIVE  NITRITE NEGATIVE  LEUKOCYTESUR MODERATE*    Lipid Panel:     Component Value Date/Time   TRIG 65 08/15/2017 1929    HgbA1C: No results found for: HGBA1C  Urine Drug Screen:  No results found for: LABOPIA, COCAINSCRNUR, LABBENZ, AMPHETMU, THCU, LABBARB  Alcohol Level: No results for input(s): ETH in the last 168 hours.  Other results: EKG: sinus rhythm at 61 bpm.  Imaging: Dg Abd 1 View  Result Date: 08/15/2017 CLINICAL DATA:  OG tube placement EXAM: ABDOMEN - 1 VIEW COMPARISON:  None. FINDINGS: Enteric tube extends into the stomach with tip in the region of the mid gastric body. IMPRESSION: OG tube extends into the stomach. Electronically Signed   By: Andreas Newport M.D.   On: 08/15/2017 23:14   US Venous Img Lower Unilateral Left  Result Date: 08/15/2017 CLINICAL DATA:  Initial evaluation for edema of left lower extremity. EXAM: Left LOWER EXTREMITY VENOUS DOPPLER ULTRASOUND TECHNIQUE: Gray-scale sonography with graded compression, as well as color Doppler and duplex ultrasound were performed to evaluate the lower extremity deep venous systems from the level of the common femoral vein and including the common femoral, femoral, profunda femoral, popliteal and calf veins including the posterior tibial, peroneal and gastrocnemius veins when visible. The superficial great saphenous vein was also interrogated. Spectral  Doppler was utilized to evaluate flow at rest and with distal augmentation maneuvers in the common femoral, femoral and popliteal veins. COMPARISON:  None. FINDINGS: Contralateral Common Femoral Vein: Respiratory phasicity is normal and symmetric with the symptomatic side. No evidence of thrombus. Normal compressibility. Common Femoral Vein: No evidence of thrombus. Normal compressibility, respiratory phasicity and response to augmentation. Saphenofemoral Junction: No evidence of thrombus. Normal compressibility and flow on color Doppler imaging. Profunda Femoral Vein: No evidence of thrombus. Normal compressibility and flow on color Doppler imaging. Femoral Vein: No evidence of thrombus. Normal compressibility, respiratory phasicity and response to augmentation. Popliteal Vein: No evidence of thrombus. Normal compressibility, respiratory phasicity and response to augmentation. Calf Veins: No evidence of thrombus. Normal compressibility and flow on color Doppler imaging. Superficial Great Saphenous Vein: Not visualized. Reportedly, patient status post surgery with possible graft prior gestation. Venous Reflux:  None. Other Findings: Few mildly prominent lymph nodes noted at the left groin measuring up to 2.2 cm in long axis. IMPRESSION: No evidence of DVT within the left lower extremity. Electronically Signed   By: Jeannine Boga M.D.   On: 08/15/2017 20:31   Dg Chest Port 1 View  Result Date: 08/15/2017 CLINICAL DATA:  Central line placement. EXAM: PORTABLE CHEST 1 VIEW 4:43 p.m. COMPARISON:  08/15/2017 at 5:48 a.m. FINDINGS: New left internal jugular catheter has been inserted. The tip is at the junction of the left innominate vein and superior vena cava but directed toward the wall of  the SVC. Endotracheal tube tip is 4.5 cm above the carina in good position. The patient has developed faint pulmonary edema in the right mid and lower lung zone. There is slight haziness in the left perihilar region and  left lung base, slightly increased. Aortic atherosclerosis. IMPRESSION: 1. New left central venous catheter tip is in the superior vena cava, directed toward the wall. 2. New pulmonary edema on the right. Persistent pulmonary edema on the left. 3. Aortic atherosclerosis. 4. Endotracheal tube in good position. Electronically Signed   By: Lorriane Shire M.D.   On: 08/15/2017 17:04   Dg Chest Portable 1 View  Result Date: 08/15/2017 CLINICAL DATA:  Diagonal respirations.  Sepsis. EXAM: PORTABLE CHEST 1 VIEW COMPARISON:  05/30/2017 FINDINGS: Patchy airspace opacities in the central and basilar left lung could represent pneumonia. Probable small left pleural effusion. Right lung is clear. Pulmonary vasculature is normal. Unchanged cardiomegaly. IMPRESSION: Patchy opacities in the central and basilar left lung, with a small left pleural effusion. This could represent pneumonia. Electronically Signed   By: Andreas Newport M.D.   On: 08/15/2017 06:08     Assessment/Plan: 71 year old male admitted after an episode of unresponsiveness as his facility.  Has had multiple since admission.  Seem to start with SOB then cardiac arrhythmia.  Unclear if this is a primary cardiac versus primary neurological event.  Patient with high potassium, low hemoglobin and PNA complicating clinical scenario further.  Serum magnesium and phosphorus are normal.  Will cover for possible seizures with further work up to follow.  Recommendations: 1. Discussed with family at this point unclear if seizure, EEG was negative for seizures or epileptiform activity, will await CT head results. 2.  Would not load Keppra.  loaded Vimpat 288m IV now with maintenance of 1087mq 12 hours 3.  Seizure precautions 4.  EEG slowed without seizure activity or epileptiform findings. 5.  Head CT without contrast once stable, pending.  If unremarkable, MRI of the brain indicated. 6.  UDS not completed, still recommend  This patient is critically ill  and at significant risk of neurological worsening, death and care requires constant monitoring of vital signs, hemodynamics,respiratory and cardiac monitoring, neurological assessment, discussion with family, other specialists and medical decision making of high complexity. I spent 35 minutes of neurocritical care time  in the care of  this patient.   AnSarina IllMD Neurology [phone]/12/2016, 11:21 AM

## 2017-08-16 NOTE — Progress Notes (Signed)
[facility] Critical Care Medicine Progess Note     ASSESSMENT  Acute on chronic hypoxic respiratory failure secondary to possible pneumonia and AECOPD  Acute encephalopathy  Cardiac Arrest etiology unknown- rhythm asystole Anemia with acute blood loss  Lactic acidosis  Hx: Type II DM, PVD s/p Right AKA and bypass on left  PLAN Will continue full vent support at this time. On BD.  Will try to minimize sedation to assess mental status. Neurology following.  Will monitor Hgb closely. Hold off on transfusion at this time.  DVT/GI prophylaxis. Full Code.  Daughter and wife updated at bedside.  Cc: 32 minutes  Dorothyann Gibbs MD    SUBJECTIVE:  Patient seen and examined at bedside. Noted acute events overnight. Sedated. Minimally responsive. Did not tolerate weaning.   VITAL SIGNS: Temp:  [97.3 F (36.3 C)-97.9 F (36.6 C)] 97.6 F (36.4 C) (09/01 1200) Pulse Rate:  [47-82] 54 (09/01 1200) Resp:  [13-25] 13 (09/01 1200) BP: (117-157)/(50-111) 125/62 (09/01 1200) SpO2:  [89 %-100 %] 100 % (09/01 1242) FiO2 (%):  [35 %-50 %] 35 % (09/01 1242) Weight:  [175 lb 4.3 oz (79.5 kg)] 175 lb 4.3 oz (79.5 kg) (09/01 0500)   PHYSICAL EXAMINATION:    VS: BP 125/62   Pulse (!) 54   Temp 97.6 F (36.4 C)   Resp 13   Ht 5\' 9"  (1.753 m)   Wt 175 lb 4.3 oz (79.5 kg)   SpO2 100%   BMI 25.88 kg/m    General Appearance: No distress  Neuro: sedated HEENT: ETT+ Pulmonary: decreased breath sounds   Cardiovascular: Normal S1,S2.  No m/r/g.   Abdomen: Benign, Soft, non-tender. Skin:   warm, no rashes, no ecchymosis  Extremities: s/p right aka    LABORATORY PANEL:   CBC  Recent Labs Lab 08/16/17 0832 08/16/17 1220  WBC 12.6*  --   HGB 7.6* 8.1*  HCT 23.3* 24.8*  PLT 467*  --     Chemistries   Recent Labs Lab 08/15/17 0536 08/15/17 1249  08/16/17 0832  NA 137 137  --  136  K 4.9 5.2*  < > 4.7  CL 108 109  --  107  CO2 19* 20*  --  23  GLUCOSE 208* 190*  --   108*  BUN 17 20  --  26*  CREATININE 1.11 1.06  --  1.12  CALCIUM 8.5* 8.6*  --  7.9*  MG  --  1.8  --   --   PHOS  --  3.8  --   --   AST 19  --   --   --   ALT 5*  --   --   --   ALKPHOS 57  --   --   --   BILITOT 0.2*  --   --   --   < > = values in this interval not displayed.   Recent Labs Lab 08/15/17 1703 08/15/17 1947 08/15/17 2358 08/16/17 0355 08/16/17 0722 08/16/17 1212  GLUCAP 231* 187* 126* 123* 113* 120*    Recent Labs Lab 08/15/17 1141 08/15/17 1755 08/16/17 1100  PHART 7.35 7.30* 7.29*  PCO2ART 38 47 47  PO2ART 176* 54* 108    Recent Labs Lab 08/15/17 0536  AST 19  ALT 5*  ALKPHOS 57  BILITOT 0.2*  ALBUMIN 3.1*    Cardiac Enzymes  Recent Labs Lab 08/16/17 0244  TROPONINI 0.32*    RADIOLOGY:  Dg Abd 1 View  Result Date:  08/15/2017 CLINICAL DATA:  OG tube placement EXAM: ABDOMEN - 1 VIEW COMPARISON:  None. FINDINGS: Enteric tube extends into the stomach with tip in the region of the mid gastric body. IMPRESSION: OG tube extends into the stomach. Electronically Signed   By: Ellery Plunkaniel R Mitchell M.D.   On: 08/15/2017 23:14   [place] Venous Img Lower Unilateral Left  Result Date: 08/15/2017 CLINICAL DATA:  Initial evaluation for edema of left lower extremity. EXAM: Left LOWER EXTREMITY VENOUS DOPPLER ULTRASOUND TECHNIQUE: Gray-scale sonography with graded compression, as well as color Doppler and duplex ultrasound were performed to evaluate the lower extremity deep venous systems from the level of the common femoral vein and including the common femoral, femoral, profunda femoral, popliteal and calf veins including the posterior tibial, peroneal and gastrocnemius veins when visible. The superficial great saphenous vein was also interrogated. Spectral Doppler was utilized to evaluate flow at rest and with distal augmentation maneuvers in the common femoral, femoral and popliteal veins. COMPARISON:  None. FINDINGS: Contralateral Common Femoral Vein:  Respiratory phasicity is normal and symmetric with the symptomatic side. No evidence of thrombus. Normal compressibility. Common Femoral Vein: No evidence of thrombus. Normal compressibility, respiratory phasicity and response to augmentation. Saphenofemoral Junction: No evidence of thrombus. Normal compressibility and flow on color Doppler imaging. Profunda Femoral Vein: No evidence of thrombus. Normal compressibility and flow on color Doppler imaging. Femoral Vein: No evidence of thrombus. Normal compressibility, respiratory phasicity and response to augmentation. Popliteal Vein: No evidence of thrombus. Normal compressibility, respiratory phasicity and response to augmentation. Calf Veins: No evidence of thrombus. Normal compressibility and flow on color Doppler imaging. Superficial Great Saphenous Vein: Not visualized. Reportedly, patient status post surgery with possible graft prior gestation. Venous Reflux:  None. Other Findings: Few mildly prominent lymph nodes noted at the left groin measuring up to 2.2 cm in long axis. IMPRESSION: No evidence of DVT within the left lower extremity. Electronically Signed   By: Rise MuBenjamin  McClintock M.D.   On: 08/15/2017 20:31   Dg Chest Port 1 View  Result Date: 08/15/2017 CLINICAL DATA:  Central line placement. EXAM: PORTABLE CHEST 1 VIEW 4:43 p.m. COMPARISON:  08/15/2017 at 5:48 a.m. FINDINGS: New left internal jugular catheter has been inserted. The tip is at the junction of the left innominate vein and superior vena cava but directed toward the wall of the SVC. Endotracheal tube tip is 4.5 cm above the carina in good position. The patient has developed faint pulmonary edema in the right mid and lower lung zone. There is slight haziness in the left perihilar region and left lung base, slightly increased. Aortic atherosclerosis. IMPRESSION: 1. New left central venous catheter tip is in the superior vena cava, directed toward the wall. 2. New pulmonary edema on the  right. Persistent pulmonary edema on the left. 3. Aortic atherosclerosis. 4. Endotracheal tube in good position. Electronically Signed   By: Francene BoyersJames  Maxwell M.D.   On: 08/15/2017 17:04   Dg Chest Portable 1 View  Result Date: 08/15/2017 CLINICAL DATA:  Diagonal respirations.  Sepsis. EXAM: PORTABLE CHEST 1 VIEW COMPARISON:  05/30/2017 FINDINGS: Patchy airspace opacities in the central and basilar left lung could represent pneumonia. Probable small left pleural effusion. Right lung is clear. Pulmonary vasculature is normal. Unchanged cardiomegaly. IMPRESSION: Patchy opacities in the central and basilar left lung, with a small left pleural effusion. This could represent pneumonia. Electronically Signed   By: Ellery Plunkaniel R Mitchell M.D.   On: 08/15/2017 06:08     08/16/2017

## 2017-08-17 DIAGNOSIS — D649 Anemia, unspecified: Secondary | ICD-10-CM

## 2017-08-17 LAB — TYPE AND SCREEN
ABO/RH(D): O POS
ANTIBODY SCREEN: NEGATIVE
UNIT DIVISION: 0
UNIT DIVISION: 0
Unit division: 0
Unit division: 0

## 2017-08-17 LAB — CBC
HEMATOCRIT: 25.2 % — AB (ref 40.0–52.0)
Hemoglobin: 8 g/dL — ABNORMAL LOW (ref 13.0–18.0)
MCH: 25.9 pg — ABNORMAL LOW (ref 26.0–34.0)
MCHC: 31.7 g/dL — AB (ref 32.0–36.0)
MCV: 81.8 fL (ref 80.0–100.0)
PLATELETS: 441 10*3/uL — AB (ref 150–440)
RBC: 3.08 MIL/uL — ABNORMAL LOW (ref 4.40–5.90)
RDW: 19.2 % — AB (ref 11.5–14.5)
WBC: 12.8 10*3/uL — AB (ref 3.8–10.6)

## 2017-08-17 LAB — URINE CULTURE: CULTURE: NO GROWTH

## 2017-08-17 LAB — BPAM RBC
BLOOD PRODUCT EXPIRATION DATE: 201809262359
BLOOD PRODUCT EXPIRATION DATE: 201809272359
Blood Product Expiration Date: 201809052359
Blood Product Expiration Date: 201809272359
ISSUE DATE / TIME: 201808312233
ISSUE DATE / TIME: 201809010443
ISSUE DATE / TIME: 201808310906
ISSUE DATE / TIME: 201808311405
UNIT TYPE AND RH: 5100
UNIT TYPE AND RH: 5100
Unit Type and Rh: 5100
Unit Type and Rh: 5100

## 2017-08-17 LAB — BASIC METABOLIC PANEL
Anion gap: 6 (ref 5–15)
BUN: 31 mg/dL — AB (ref 6–20)
CO2: 23 mmol/L (ref 22–32)
CREATININE: 1.02 mg/dL (ref 0.61–1.24)
Calcium: 7.8 mg/dL — ABNORMAL LOW (ref 8.9–10.3)
Chloride: 108 mmol/L (ref 101–111)
GFR calc Af Amer: >60 mL/min (ref >60)
GLUCOSE: 142 mg/dL — AB (ref 65–99)
Potassium: 4.7 mmol/L (ref 3.5–5.1)
SODIUM: 137 mmol/L (ref 135–145)

## 2017-08-17 LAB — GLUCOSE, CAPILLARY
GLUCOSE-CAPILLARY: 142 mg/dL — AB (ref 65–99)
GLUCOSE-CAPILLARY: 142 mg/dL — AB (ref 65–99)
Glucose-Capillary: 121 mg/dL — ABNORMAL HIGH (ref 65–99)
Glucose-Capillary: 132 mg/dL — ABNORMAL HIGH (ref 65–99)
Glucose-Capillary: 143 mg/dL — ABNORMAL HIGH (ref 65–99)
Glucose-Capillary: 152 mg/dL — ABNORMAL HIGH (ref 65–99)
Glucose-Capillary: 192 mg/dL — ABNORMAL HIGH (ref 65–99)

## 2017-08-17 LAB — PROCALCITONIN: Procalcitonin: <0.1 ng/mL

## 2017-08-17 LAB — PHOSPHORUS: Phosphorus: 3.6 mg/dL (ref 2.5–4.6)

## 2017-08-17 LAB — MAGNESIUM: Magnesium: 2.1 mg/dL (ref 1.7–2.4)

## 2017-08-17 LAB — FIBRIN DERIVATIVES D-DIMER (ARMC ONLY): FIBRIN DERIVATIVES D-DIMER (ARMC): 1711.92 — AB (ref 0.00–499.00)

## 2017-08-17 LAB — TROPONIN I: TROPONIN I: 0.17 ng/mL — AB (ref <0.03)

## 2017-08-17 MED ORDER — VITAL AF 1.2 CAL PO LIQD
1000.0000 mL | ORAL | Status: DC
  Administered 2017-08-17 – 2017-08-18 (×2): 1000 mL

## 2017-08-17 MED ORDER — VITAL HIGH PROTEIN PO LIQD: 1000.0000 mL | ORAL | Status: DC

## 2017-08-17 MED ORDER — IPRATROPIUM-ALBUTEROL 0.5-2.5 (3) MG/3ML IN SOLN
3.0000 mL | Freq: Four times a day (QID) | RESPIRATORY_TRACT | Status: DC
  Administered 2017-08-17 – 2017-08-19 (×9): 3 mL via RESPIRATORY_TRACT
  Filled 2017-08-17 (×9): qty 3

## 2017-08-17 MED ORDER — ENOXAPARIN SODIUM 40 MG/0.4ML ~~LOC~~ SOLN
40.0000 mg | SUBCUTANEOUS | Status: DC
  Administered 2017-08-17 – 2017-08-21 (×5): 40 mg via SUBCUTANEOUS
  Filled 2017-08-17 (×5): qty 0.4

## 2017-08-17 MED ORDER — PRO-STAT SUGAR FREE PO LIQD
60.0000 mL | Freq: Every day | ORAL | Status: DC
  Administered 2017-08-17 – 2017-08-18 (×2): 60 mL

## 2017-08-17 MED FILL — Medication: Qty: 1 | Status: AC

## 2017-08-17 NOTE — Progress Notes (Signed)
Initial Nutrition Assessment  DOCUMENTATION CODES:   Not applicable  INTERVENTION:  Recommend initiating Vital AF 1.2 at 40 ml/hr + Pro-Stat 60 ml daily via OGT. Goal regimen provides 1352 kcal, 102 grams of protein, 778 ml H2O daily. With current propofol rate provides 1727 kcal.  Provide liquid multivitamin with minerals daily per tube as goal TF regimen does not meet 100% RDIs.  NUTRITION DIAGNOSIS:   Inadequate oral intake related to inability to eat as evidenced by NPO status.  GOAL:   Provide needs based on ASPEN/SCCM guidelines  MONITOR:   Vent status, Labs, Weight trends, TF tolerance, Skin, I & O's  REASON FOR ASSESSMENT:   Ventilator    ASSESSMENT:   71 year old male with PMHx of DM type 2, HTN, PAD s/p right AKA, COPD, intermittent confusion, remote hx of EtOH abuse [facility] and respiratory distress, anemia. While on floor patient suffered acute cardiac arrest and acute respiratory failure, was intubated.   No family members at bedside. Limited weight history in chart. Weight of 165 lbs from 05/30/2017, but appears to be stated weight and not true measured weight. Will continue to monitor.  Access: OGT placed 8/31; tip terminates in mid gastric body per abdominal x-ray from 8/31  MAP: >/= 60  Patient is currently intubated on ventilator support MV: 9.1 L/min Temp (24hrs), Avg:98.1 F (36.7 C), Min:97.6 F (36.4 C), Max:98.4 F (36.9 C)  Propofol: 14.2 ml/hr (375 kcal daily)  Medications reviewed and include: Novolog 0-15 units Q4hrs, methylprednisolone 40 mg Q12hrs, pantoprazole, famotidine, fentanyl gtt, propofol gtt.  Labs reviewed: CBG 120-143 past 24 hrs, BUN 31, elevated Troponin.  Nutrition-Focused physical exam completed. Findings are no fat depletion, no muscle depletion. Unable to assess muscle status of left lower leg well in setting of cellulitis with cracking. Patient s/p right AKA.  Discussed  with RN. Patient did not require hypothermia protocol. Plan is to initiate TF today.  Diet Order:  Diet NPO time specified  Skin:  Wound (see comment) (stg II left buttocks, cellulitis with cracking left leg)  Last BM:  08/15/2017 - medium type 2  Height:   Ht Readings from Last 1 Encounters:  08/15/17 5\' 9"  (1.753 m)    Weight:   Wt Readings from Last 1 Encounters:  08/16/17 175 lb 4.3 oz (79.5 kg)    Ideal Body Weight:  66.9 kg (adjusted for left AKA)  BMI:  Body mass index is 25.88 kg/m.  Estimated Nutritional Needs:   Kcal:  1717 (PSU 2003b w/ MSJ 1546, Ve 9.1, Tmax 36.9)  Protein:  95-111 grams (1.2-1.4 grams/kg)  Fluid:  2-2.4 L/day (25-30 ml/kg)  EDUCATION NEEDS:   No education needs identified at this time  Helane RimaLeanne Arty Lantzy, MS, RD, LDN Pager: [phone] After Hours Pager: [phone]

## 2017-08-17 NOTE — Progress Notes (Signed)
[facility] Critical Care Medicine Progess Note     ASSESSMENT  Acute on chronic hypoxic respiratory failure secondary to possible pneumonia and AECOPD  Acute encephalopathy  Cardiac Arrest etiology unknown- rhythm asystole Anemia with acute blood loss  Lactic acidosis  Hx: Type II DM, PVD s/p Right AKA and bypass on left  PLAN Will try weaning from ventilator as tolerated. Will check VQ scan. ABG pending. Check CT head. Will hold off on full dose anticoagulation pending CT head. Neurology following.  No indication for transfusion at this time. DVT/GI prophylaxis. Full Code.    Cc: 32 minutes  Dorothyann Gibbs MD    SUBJECTIVE:  Patient seen and examined at bedside. Noted acute events overnight. Minimally responsive.    VITAL SIGNS: Temp:  [97.6 F (36.4 C)-98.4 F (36.9 C)] 97.6 F (36.4 C) (09/02 0800) Pulse Rate:  [46-69] 46 (09/02 1000) Resp:  [12-18] 16 (09/02 0100) BP: (127-150)/(56-77) 140/59 (09/02 1000) SpO2:  [97 %-100 %] 97 % (09/02 1300) FiO2 (%):  [28 %-35 %] 28 % (09/02 1300)   PHYSICAL EXAMINATION:    VS: BP (!) 140/59   Pulse (!) 46   Temp 97.6 F (36.4 C)   Resp 16   Ht 5\' 9"  (1.753 m)   Wt 175 lb 4.3 oz (79.5 kg)   SpO2 97%   BMI 25.88 kg/m    General Appearance: No distress  Neuro: sedated HEENT: ETT+ Pulmonary: decreased breath sounds   Cardiovascular: Normal S1,S2.  No m/r/g.   Abdomen: Benign, Soft, non-tender. Skin:   warm, no rashes, no ecchymosis  Extremities: s/p right aka    LABORATORY PANEL:   CBC  Recent Labs Lab 08/17/17 0509  WBC 12.8*  HGB 8.0*  HCT 25.2*  PLT 441*    Chemistries   Recent Labs Lab 08/15/17 0536 08/15/17 1249  08/17/17 0509  NA 137 137  < > 137  K 4.9 5.2*  < > 4.7  CL 108 109  < > 108  CO2 19* 20*  < > 23  GLUCOSE 208* 190*  < > 142*  BUN 17 20  < > 31*  CREATININE 1.11 1.06  < > 1.02  CALCIUM 8.5* 8.6*  < > 7.8*  MG  --  1.8  --   --   PHOS  --  3.8  --   --   AST 19  --    --   --   ALT 5*  --   --   --   ALKPHOS 57  --   --   --   BILITOT 0.2*  --   --   --   < > = values in this interval not displayed.   Recent Labs Lab 08/16/17 1637 08/16/17 2012 08/17/17 0011 08/17/17 0418 08/17/17 0726 08/17/17 1210  GLUCAP 138* 124* 121* 142* 132* 143*    Recent Labs Lab 08/15/17 1755 08/16/17 1100 08/17/17 1030  PHART 7.30* 7.29* 7.31*  PCO2ART 47 47 47  PO2ART 54* 108 43*    Recent Labs Lab 08/15/17 0536  AST 19  ALT 5*  ALKPHOS 57  BILITOT 0.2*  ALBUMIN 3.1*    Cardiac Enzymes  Recent Labs Lab 08/17/17 1038  TROPONINI 0.17*    RADIOLOGY:  Dg Abd 1 View  Result Date: 08/15/2017 CLINICAL DATA:  OG tube placement EXAM: ABDOMEN - 1 VIEW COMPARISON:  None. FINDINGS: Enteric tube extends into the stomach with tip in the region of the mid gastric body. IMPRESSION:  OG tube extends into the stomach. Electronically Signed   By: Ellery Plunkaniel R Mitchell M.D.   On: 08/15/2017 23:14   [place] Venous Img Lower Unilateral Left  Result Date: 08/15/2017 CLINICAL DATA:  Initial evaluation for edema of left lower extremity. EXAM: Left LOWER EXTREMITY VENOUS DOPPLER ULTRASOUND TECHNIQUE: Gray-scale sonography with graded compression, as well as color Doppler and duplex ultrasound were performed to evaluate the lower extremity deep venous systems from the level of the common femoral vein and including the common femoral, femoral, profunda femoral, popliteal and calf veins including the posterior tibial, peroneal and gastrocnemius veins when visible. The superficial great saphenous vein was also interrogated. Spectral Doppler was utilized to evaluate flow at rest and with distal augmentation maneuvers in the common femoral, femoral and popliteal veins. COMPARISON:  None. FINDINGS: Contralateral Common Femoral Vein: Respiratory phasicity is normal and symmetric with the symptomatic side. No evidence of thrombus. Normal compressibility. Common Femoral Vein: No evidence of  thrombus. Normal compressibility, respiratory phasicity and response to augmentation. Saphenofemoral Junction: No evidence of thrombus. Normal compressibility and flow on color Doppler imaging. Profunda Femoral Vein: No evidence of thrombus. Normal compressibility and flow on color Doppler imaging. Femoral Vein: No evidence of thrombus. Normal compressibility, respiratory phasicity and response to augmentation. Popliteal Vein: No evidence of thrombus. Normal compressibility, respiratory phasicity and response to augmentation. Calf Veins: No evidence of thrombus. Normal compressibility and flow on color Doppler imaging. Superficial Great Saphenous Vein: Not visualized. Reportedly, patient status post surgery with possible graft prior gestation. Venous Reflux:  None. Other Findings: Few mildly prominent lymph nodes noted at the left groin measuring up to 2.2 cm in long axis. IMPRESSION: No evidence of DVT within the left lower extremity. Electronically Signed   By: Rise MuBenjamin  McClintock M.D.   On: 08/15/2017 20:31   Dg Chest Port 1 View  Result Date: 08/16/2017 CLINICAL DATA:  Intubated, CPR EXAM: PORTABLE CHEST 1 VIEW COMPARISON:  08/15/2017 FINDINGS: Endotracheal tube 4.6 cm above the carina. Left IJ central line tip at the innominate venous confluence, unchanged. NG tube enters the stomach with the tip not visualized. Cardiomegaly evident with vascular congestion versus mild edema. Suspect small pleural effusions layering posteriorly with associated basilar atelectasis. No new focal pneumonia, collapse or consolidation.  No pneumothorax. IMPRESSION: Stable support apparatus. Cardiomegaly with mild edema pattern and trace pleural effusions Minor basilar atelectasis No significant change compared to yesterday Electronically Signed   By: Judie PetitM.  Shick M.D.   On: 08/16/2017 13:44   Dg Chest Port 1 View  Result Date: 08/15/2017 CLINICAL DATA:  Central line placement. EXAM: PORTABLE CHEST 1 VIEW 4:43 p.m. COMPARISON:   08/15/2017 at 5:48 a.m. FINDINGS: New left internal jugular catheter has been inserted. The tip is at the junction of the left innominate vein and superior vena cava but directed toward the wall of the SVC. Endotracheal tube tip is 4.5 cm above the carina in good position. The patient has developed faint pulmonary edema in the right mid and lower lung zone. There is slight haziness in the left perihilar region and left lung base, slightly increased. Aortic atherosclerosis. IMPRESSION: 1. New left central venous catheter tip is in the superior vena cava, directed toward the wall. 2. New pulmonary edema on the right. Persistent pulmonary edema on the left. 3. Aortic atherosclerosis. 4. Endotracheal tube in good position. Electronically Signed   By: Francene BoyersJames  Maxwell M.D.   On: 08/15/2017 17:04     08/17/2017

## 2017-08-17 NOTE — Progress Notes (Signed)
Spoke with Mordecai RasmussenKelly Martin the nuclear medicine tech who reports VQ scan is non advised for patients on vent.  Dr. Chales AbrahamsGupta made aware.

## 2017-08-17 NOTE — Progress Notes (Signed)
[employer] - [place] at [facility]   PATIENT NAME: Blake Villarreal    MR#:  [id number]  DATE OF BIRTH:  [birth year]  SUBJECTIVE:  CHIEF COMPLAINT:   Chief Complaint  Patient presents with  . Shortness of Breath  remains sedated, intubated. On full vent control REVIEW OF SYSTEMS:    Review of Systems  Unable to perform ROS: Mental status change   DRUG ALLERGIES:   Allergies  Allergen Reactions  . Daptomycin   . Iodinated Diagnostic Agents    VITALS:  Blood pressure (!) 140/59, pulse (!) 46, temperature 97.6 F (36.4 C), resp. rate 16, height 5\' 9"  (1.753 m), weight 79.5 kg (175 lb 4.3 oz), SpO2 100 %.  PHYSICAL EXAMINATION:   Physical Exam  GENERAL:  71 y.o.-year-old patient lying in the bed, intubated EYES: Pupils equal, round, reactive to light  HEENT: Head atraumatic, normocephalic. Oropharynx and nasopharynx clear.  LUNGS: Coarse breath sounds with wheezing CARDIOVASCULAR: S1, S2 normal ABDOMEN: Soft, nontender, nondistended.  EXTREMITIES: No cyanosis. Right below-knee amputation NEUROLOGIC: Sedated and non responsive to stimulus PSYCHIATRIC: unable to assess LABORATORY PANEL:   CBC  Recent Labs Lab 08/17/17 0509  WBC 12.8*  HGB 8.0*  HCT 25.2*  PLT 441*   ------------------------------------------------------------------------------------------------------------------ Chemistries   Recent Labs Lab 08/15/17 0536 08/15/17 1249  08/17/17 0509  NA 137 137  < > 137  K 4.9 5.2*  < > 4.7  CL 108 109  < > 108  CO2 19* 20*  < > 23  GLUCOSE 208* 190*  < > 142*  BUN 17 20  < > 31*  CREATININE 1.11 1.06  < > 1.02  CALCIUM 8.5* 8.6*  < > 7.8*  MG  --  1.8  --   --   AST 19  --   --   --   ALT 5*  --   --   --   ALKPHOS 57  --   --   --   BILITOT 0.2*  --   --   --   < > = values in this interval not displayed. ------------------------------------------------------------------------------------------------------------------  Cardiac  Enzymes  Recent Labs Lab 08/17/17 1038  TROPONINI 0.17*   ------------------------------------------------------------------------------------------------------------------  RADIOLOGY:  Dg Abd 1 View  Result Date: 08/15/2017 CLINICAL DATA:  OG tube placement EXAM: ABDOMEN - 1 VIEW COMPARISON:  None. FINDINGS: Enteric tube extends into the stomach with tip in the region of the mid gastric body. IMPRESSION: OG tube extends into the stomach. Electronically Signed   By: Ellery Plunk M.D.   On: 08/15/2017 23:14   US Venous Img Lower Unilateral Left  Result Date: 08/15/2017 CLINICAL DATA:  Initial evaluation for edema of left lower extremity. EXAM: Left LOWER EXTREMITY VENOUS DOPPLER ULTRASOUND TECHNIQUE: Gray-scale sonography with graded compression, as well as color Doppler and duplex ultrasound were performed to evaluate the lower extremity deep venous systems from the level of the common femoral vein and including the common femoral, femoral, profunda femoral, popliteal and calf veins including the posterior tibial, peroneal and gastrocnemius veins when visible. The superficial great saphenous vein was also interrogated. Spectral Doppler was utilized to evaluate flow at rest and with distal augmentation maneuvers in the common femoral, femoral and popliteal veins. COMPARISON:  None. FINDINGS: Contralateral Common Femoral Vein: Respiratory phasicity is normal and symmetric with the symptomatic side. No evidence of thrombus. Normal compressibility. Common Femoral Vein: No evidence of thrombus. Normal compressibility, respiratory phasicity and response to augmentation. Saphenofemoral Junction: No evidence  of thrombus. Normal compressibility and flow on color Doppler imaging. Profunda Femoral Vein: No evidence of thrombus. Normal compressibility and flow on color Doppler imaging. Femoral Vein: No evidence of thrombus. Normal compressibility, respiratory phasicity and response to augmentation.  Popliteal Vein: No evidence of thrombus. Normal compressibility, respiratory phasicity and response to augmentation. Calf Veins: No evidence of thrombus. Normal compressibility and flow on color Doppler imaging. Superficial Great Saphenous Vein: Not visualized. Reportedly, patient status post surgery with possible graft prior gestation. Venous Reflux:  None. Other Findings: Few mildly prominent lymph nodes noted at the left groin measuring up to 2.2 cm in long axis. IMPRESSION: No evidence of DVT within the left lower extremity. Electronically Signed   By: Rise MuBenjamin  McClintock M.D.   On: 08/15/2017 20:31   Dg Chest Port 1 View  Result Date: 08/16/2017 CLINICAL DATA:  Intubated, CPR EXAM: PORTABLE CHEST 1 VIEW COMPARISON:  08/15/2017 FINDINGS: Endotracheal tube 4.6 cm above the carina. Left IJ central line tip at the innominate venous confluence, unchanged. NG tube enters the stomach with the tip not visualized. Cardiomegaly evident with vascular congestion versus mild edema. Suspect small pleural effusions layering posteriorly with associated basilar atelectasis. No new focal pneumonia, collapse or consolidation.  No pneumothorax. IMPRESSION: Stable support apparatus. Cardiomegaly with mild edema pattern and trace pleural effusions Minor basilar atelectasis No significant change compared to yesterday Electronically Signed   By: Judie PetitM.  Shick M.D.   On: 08/16/2017 13:44   Dg Chest Port 1 View  Result Date: 08/15/2017 CLINICAL DATA:  Central line placement. EXAM: PORTABLE CHEST 1 VIEW 4:43 p.m. COMPARISON:  08/15/2017 at 5:48 a.m. FINDINGS: New left internal jugular catheter has been inserted. The tip is at the junction of the left innominate vein and superior vena cava but directed toward the wall of the SVC. Endotracheal tube tip is 4.5 cm above the carina in good position. The patient has developed faint pulmonary edema in the right mid and lower lung zone. There is slight haziness in the left perihilar region  and left lung base, slightly increased. Aortic atherosclerosis. IMPRESSION: 1. New left central venous catheter tip is in the superior vena cava, directed toward the wall. 2. New pulmonary edema on the right. Persistent pulmonary edema on the left. 3. Aortic atherosclerosis. 4. Endotracheal tube in good position. Electronically Signed   By: Francene BoyersJames  Maxwell M.D.   On: 08/15/2017 17:04   ASSESSMENT AND PLAN:  71 year old male admitted after an episode of unresponsiveness as his facility.  Has had multiple since admission.  Seem to start with SOB then cardiac arrhythmia.  Unclear if this is a primary cardiac versus primary neurological event.  Patient with high potassium, low hemoglobin and PNA  * Acute metabolic encephalopathy - multifactorial  * New onset seizure - EEG negative for seizures or epileptiform activity. - continue Vimpat 100mg  q 12 hours - Seizure precautions - CT/MRI head per neuro - d/w nursing to check UDS  * GI bleed with acute blood loss anemia - s/p 1 unit of blood transfusion - Hb 8.0 - Appreciate GI input, recommend conservative mgmt for now  * Acute COPD exacerbation. On steroids and scheduled nebulizers.  * Acute hypoxic respiratory failure - on full vent support - weaning trial today - mgmt per Intensivist  * Cardiac Arrest etiology unknown- rhythm asystole - cardio seen and following   All the records are reviewed and case discussed with Care Management/Social Worker Management plans discussed with the patient, NURSING, Dr. Chales AbrahamsGupta and  they are in agreement.  CODE STATUS: FULL CODE  DVT Prophylaxis: SCDs  TOTAL TIME TAKING CARE OF THIS PATIENT: 35 minutes.   Delfino Lovett M.D on 08/17/2017 at 12:30 PM  Between 7am to 6pm - Pager - [phone]  After 6pm go to [email or web address] - password [email or web address] [facility]  [facility] [place] [facility]  Office  [phone]  CC: Primary care physician; Keane Police, MD  Note: This dictation was prepared with Dragon  dictation along with smaller phrase technology. Any transcriptional errors that result from this process are unintentional.

## 2017-08-17 NOTE — Progress Notes (Signed)
Interval History: Patient remains intubated and sedated, on full vent control.No family at bedside today.   HPI: Blake Villarreal is an 71 y.o. male with multiple medical problems noted to have agonal breathing at [facility].  Patient was found to be breathing 3-5 times a minute. EMS was called. He was bagged briefly and put on a CPAP and brought to the emergency room. Patient slowly woke up. He does not remember the events at [facility].  Was found to have PNA, anemia with hgb of 4.6 and heme positive stools.  Patient was admitted and after arriving to the floor had an episode when patient was noted to be unresponsive with HR in the 40's and hypoxic.  Noted to have some drawing of his limbs.  Felt to be seizure and patient ordered to have [place].  Patient recovered and was brought to the ICU where during my evaluation complained continuously of having difficulty breathing despite adequate saturation.  After leaving the room patient became asystolic and unresponsive.  No tonic-clonic activity noted.  With no quick recovery patient given Atropine and bagged.  There was then recovery of HR, saturations and patient slowly became more responsive.  Patient amnestic of events.    Past Medical History:  Diagnosis Date  . Diabetes mellitus without complication ([place])   . Hypertension   . Intermittent confusion   . PAD (peripheral artery disease) ([place])     Past Surgical History:  Procedure Laterality Date  . ABOVE KNEE LEG AMPUTATION      Family History  Problem Relation Age of Onset  . CAD Neg Hx     Social History:  reports that he has been smoking.  He has never used smokeless tobacco. He reports that he does not drink alcohol or use drugs.  Allergies  Allergen Reactions  . Daptomycin   . Iodinated Diagnostic Agents     Medications:  I have reviewed the patient's current medications. Prior to Admission:  Prescriptions Prior to Admission  Medication Sig Dispense Refill Last Dose   . amLODipine (NORVASC) 10 MG tablet Take 10 mg by mouth daily.   08/14/2017 at 0900  . aspirin 81 MG chewable tablet Chew 81 mg by mouth daily.   08/14/2017 at 0900  . atorvastatin (LIPITOR) 20 MG tablet Take 20 mg by mouth daily.   08/14/2017 at 2100  . carvedilol (COREG) 12.5 MG tablet Take 37.5 mg by mouth 2 (two) times daily with a meal.   08/14/2017 at 2100  . Cholecalciferol (VITAMIN D3) 2000 units TABS Take 1 tablet by mouth daily.   08/14/2017 at 0900  . clopidogrel (PLAVIX) 75 MG tablet Take 75 mg by mouth daily.   08/14/2017 at 0900  . docusate sodium (COLACE) 100 MG capsule Take 100 mg by mouth daily as needed.    prn at prn  . doxycycline (ADOXA) 100 MG tablet Take 100 mg by mouth 2 (two) times daily.   08/14/2017 at 2100  . doxycycline (DORYX) 100 MG EC tablet Take 100 mg by mouth 2 (two) times daily.   08/14/2017 at 2100  . gabapentin (NEURONTIN) 100 MG capsule Take 100 mg by mouth 3 (three) times daily.   08/14/2017 at 2100  . hydrocerin (EUCERIN) CREA Apply 1 application topically 2 (two) times daily.   08/14/2017 at 1600  . insulin glargine (LANTUS) 100 UNIT/ML injection Inject 5 Units into the skin at bedtime.    08/14/2017 at 2100  . insulin regular (NOVOLIN R,HUMULIN R)  100 units/mL injection Inject 4-14 Units into the skin 3 (three) times daily before meals. 0-149 = 0 units, 150-200 = 4 units, 201-250 = 7 units, 251-300 = 9 units, 301-350 = 12 units, 351-400 = 14 units, call MD > 400.   08/14/2017 at 1200  . lisinopril (PRINIVIL,ZESTRIL) 40 MG tablet Take 40 mg by mouth daily.   08/14/2017 at 0900  . Magnesium 400 MG TABS Take 400 mg by mouth daily.   08/14/2017 at 0900  . magnesium oxide (MAG-OX) 400 MG tablet Take 400 mg by mouth daily.    08/14/2017 at 0900  . metFORMIN (GLUCOPHAGE) 500 MG tablet Take 500 mg by mouth 2 (two) times daily with a meal.   08/14/2017 at 2100  . mirtazapine (REMERON) 7.5 MG tablet Take 7.5 mg by mouth at bedtime.   08/14/2017 at 2100  . omeprazole (PRILOSEC) 20  MG capsule Take 20 mg by mouth daily.   08/14/2017 at 0600  . terazosin (HYTRIN) 5 MG capsule Take 5 mg by mouth at bedtime.   08/14/2017 at 2100  . vitamin B-12 (CYANOCOBALAMIN) 1000 MCG tablet Take 1,000 mcg by mouth daily.   08/14/2017 at 0900  . acetaminophen (TYLENOL) 325 MG tablet Take 650 mg by mouth every 6 (six) hours as needed.   prn at prn  . albuterol (PROVENTIL) (2.5 MG/3ML) 0.083% nebulizer solution Take 2 ampules by nebulization every 6 (six) hours as needed for wheezing.   prn at prn   Scheduled: . budesonide (PULMICORT) nebulizer solution  0.25 mg Nebulization BID  . chlorhexidine gluconate (MEDLINE KIT)  15 mL Mouth Rinse BID  . enoxaparin (LOVENOX) injection  40 mg Subcutaneous Q24H  . feeding supplement (PRO-STAT SUGAR FREE 64)  60 mL Per Tube Daily  . feeding supplement (VITAL AF 1.2 CAL)  1,000 mL Per Tube Q24H  . insulin aspart  0-15 Units Subcutaneous Q4H  . ipratropium-albuterol  3 mL Nebulization Q6H  . mouth rinse  15 mL Mouth Rinse 10 times per day  . methylPREDNISolone (SOLU-MEDROL) injection  40 mg Intravenous Q12H  . pantoprazole (PROTONIX) IV  40 mg Intravenous Q12H  . sodium chloride flush  3 mL Intravenous Q12H    ROS: History obtained from the patient  General ROS: negative for - chills, fatigue, fever, night sweats, weight gain or weight loss Psychological ROS: negative for - behavioral disorder, hallucinations, memory difficulties, mood swings or suicidal ideation Ophthalmic ROS: negative for - blurry vision, double vision, eye pain or loss of vision ENT ROS: negative for - epistaxis, nasal discharge, oral lesions, sore throat, tinnitus or vertigo Allergy and Immunology ROS: negative for - hives or itchy/watery eyes Hematological and Lymphatic ROS: negative for - bleeding problems, bruising or swollen lymph nodes Endocrine ROS: negative for - galactorrhea, hair pattern changes, polydipsia/polyuria or temperature intolerance Respiratory ROS: shortness of  breath Cardiovascular ROS: negative for - chest pain, dyspnea on exertion, edema or irregular heartbeat Gastrointestinal ROS: negative for - abdominal pain, diarrhea, hematemesis, nausea/vomiting or stool incontinence Genito-Urinary ROS: negative for - dysuria, hematuria, incontinence or urinary frequency/urgency Musculoskeletal ROS: back pain Neurological ROS: as noted in HPI Dermatological ROS: negative for rash and skin lesion changes  Physical Examination: Blood pressure (!) 141/63, pulse (!) 58, temperature 97.6 F (36.4 C), resp. rate 19, height 5' 9"  (1.753 m), weight 173 lb 15.1 oz (78.9 kg), SpO2 100 %.  HEENT-  Normocephalic, no lesions, without obvious abnormality.   Cardiovascular- S1, S2 normal, pulses palpable throughout  Lungs- chest clear, no wheezing, rales, normal symmetric air entry Musculoskeletal-Right AKA Skin-vascular skin changes in the LLE  Neurological Examination   Mental Status: Intubated and sedated, does grimace today and raise both arms anti-gravity to stim, does not follow commands, nonverbal, does not open eyes to voice or sternal rub. Cranial Nerves: II: Discs flat bilaterally;  pupils equal, round, reactive III,IV, VI: Conjugate midline gaze V,VII: face appears symmetric VIII: unable to test IX,X: gag reflex present XI: Unable to test XII: appears midline but intubated Motor: raises both arms anti-gravity to stim Tone and bulk:normal tone throughout; no atrophy noted  Deep Tendon Reflexes: 1+ in the upper extremities and absent in the left lower extremity Plantars: Right: AKA   Left: mute Cerebellar: Unable to test Gait: not tested due to AKA   Laboratory Studies:   Basic Metabolic Panel:  Recent Labs Lab 08/15/17 0536 08/15/17 1249 08/15/17 1819 08/16/17 0832 08/17/17 0509 08/17/17 1625  NA 137 137  --  136 137  --   K 4.9 5.2* 5.4* 4.7 4.7  --   CL 108 109  --  107 108  --   CO2 19* 20*  --  23 23  --   GLUCOSE 208* 190*  --   108* 142*  --   BUN 17 20  --  26* 31*  --   CREATININE 1.11 1.06  --  1.12 1.02  --   CALCIUM 8.5* 8.6*  --  7.9* 7.8*  --   MG  --  1.8  --   --   --  2.1  PHOS  --  3.8  --   --   --  3.6    Liver Function Tests:  Recent Labs Lab 08/15/17 0536  AST 19  ALT 5*  ALKPHOS 57  BILITOT 0.2*  PROT 6.8  ALBUMIN 3.1*   No results for input(s): LIPASE, AMYLASE in the last 168 hours. No results for input(s): AMMONIA in the last 168 hours.  CBC:  Recent Labs Lab 08/15/17 0536 08/15/17 1249  08/16/17 0244 08/16/17 0832 08/16/17 1220 08/16/17 1729 08/17/17 0509  WBC 9.9 15.0*  --   --  12.6*  --   --  12.8*  NEUTROABS 7.1* 13.8*  --   --   --   --   --   --   HGB 4.6* 5.4*  < > 7.1* 7.6* 8.1* 7.9* 8.0*  HCT 15.6* 17.5*  < > 21.8* 23.3* 24.8* 24.3* 25.2*  MCV 76.9* 77.1*  --   --  80.4  --   --  81.8  PLT 590* 553*  --   --  467*  --   --  441*  < > = values in this interval not displayed.  Cardiac Enzymes:  Recent Labs Lab 08/15/17 1249 08/15/17 1819 08/16/17 0244 08/17/17 1038  TROPONINI <0.03 0.08* 0.32* 0.17*    BNP: Invalid input(s): POCBNP  CBG:  Recent Labs Lab 08/17/17 0011 08/17/17 0418 08/17/17 0726 08/17/17 1210 08/17/17 1548  GLUCAP 121* 142* 132* 143* 152*    Microbiology: Results for orders placed or performed during the hospital encounter of 08/15/17  Culture, blood (Routine x 2)     Status: None (Preliminary result)   Collection Time: 08/15/17  5:36 AM  Result Value Ref Range Status   Specimen Description BLOOD LT [facility]  Final   Special Requests NONE  Final   Culture NO GROWTH 2 DAYS  Final   Report Status PENDING  Incomplete  Culture, blood (Routine x 2)     Status: None (Preliminary result)   Collection Time: 08/15/17  5:42 AM  Result Value Ref Range Status   Specimen Description BLOOD RT HAND  Final   Special Requests   Final    BOTTLES DRAWN AEROBIC AND ANAEROBIC Blood Culture adequate volume   Culture NO GROWTH 2 DAYS  Final    Report Status PENDING  Incomplete  MRSA PCR Screening     Status: Abnormal   Collection Time: 08/15/17  8:48 PM  Result Value Ref Range Status   MRSA by PCR POSITIVE (A) NEGATIVE Final    Comment:        The GeneXpert MRSA Assay (FDA approved for NASAL specimens only), is one component of a comprehensive MRSA colonization surveillance program. It is not intended to diagnose MRSA infection nor to guide or monitor treatment for MRSA infections. RESULT CALLED TO, READ BACK BY AND VERIFIED WITH: ERICA TAYLOR AT 2206 08/15/17.PMH   Urine Culture     Status: None   Collection Time: 08/16/17  2:44 AM  Result Value Ref Range Status   Specimen Description URINE, RANDOM  Final   Special Requests NONE  Final   Culture   Final    NO GROWTH Performed at [facility], [address], [place], [place] [address]    Report Status 08/17/2017 FINAL  Final    Coagulation Studies:  Recent Labs  08/15/17 0536  LABPROT 16.0*  INR 1.29    Urinalysis:   Recent Labs Lab 08/16/17 0244  COLORURINE YELLOW*  LABSPEC 1.013  PHURINE 5.0  GLUCOSEU NEGATIVE  HGBUR SMALL*  BILIRUBINUR NEGATIVE  KETONESUR NEGATIVE  PROTEINUR NEGATIVE  NITRITE NEGATIVE  LEUKOCYTESUR MODERATE*    Lipid Panel:     Component Value Date/Time   TRIG 65 08/15/2017 1929    HgbA1C: No results found for: HGBA1C  Urine Drug Screen:  No results found for: LABOPIA, COCAINSCRNUR, LABBENZ, AMPHETMU, THCU, LABBARB  Alcohol Level: No results for input(s): ETH in the last 168 hours.  Other results: EKG: sinus rhythm at 61 bpm.  Imaging: Dg Abd 1 View  Result Date: 08/15/2017 CLINICAL DATA:  OG tube placement EXAM: ABDOMEN - 1 VIEW COMPARISON:  None. FINDINGS: Enteric tube extends into the stomach with tip in the region of the mid gastric body. IMPRESSION: OG tube extends into the stomach. Electronically Signed   By: Andreas Newport M.D.   On: 08/15/2017 23:14   Dg Chest Port 1 View  Result Date:  08/16/2017 CLINICAL DATA:  Intubated, CPR EXAM: PORTABLE CHEST 1 VIEW COMPARISON:  08/15/2017 FINDINGS: Endotracheal tube 4.6 cm above the carina. Left IJ central line tip at the innominate venous confluence, unchanged. NG tube enters the stomach with the tip not visualized. Cardiomegaly evident with vascular congestion versus mild edema. Suspect small pleural effusions layering posteriorly with associated basilar atelectasis. No new focal pneumonia, collapse or consolidation.  No pneumothorax. IMPRESSION: Stable support apparatus. Cardiomegaly with mild edema pattern and trace pleural effusions Minor basilar atelectasis No significant change compared to yesterday Electronically Signed   By: Jerilynn Mages.  Shick M.D.   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  Seem to start with SOB then cardiac arrhythmia asystole.  Unclear if this is a primary cardiac versus primary neurological event.  Patient with high potassium, low hemoglobin and  PNA complicating clinical scenario further.  Metabolic encephalopathy, GI bleed s/p transfusion, acute COPD exacerbation and acute in chronic hypoxic resp failure, cardiac arrest likely due to above etiologies but cannot rule out concomitant seizure, Will cover for possible seizures with further work up to follow.  Recommendations: 1. Discussed with family yesterday, today no family at bedside. At this point unclear if seizure, but seizure less likely given EEG was negative for seizures or epileptiform activity, will await CT head results. 2.  Continue Vimpat maintenance of 188m q 12 hours 3.  Seizure precautions 4.  EEG slowed without seizure activity or epileptiform findings. 5.  Head CT without contrast once stable, pending.  If unremarkable, MRI of the brain indicated. 6.  UDS not completed, still recommend  This patient is critically  ill and at significant risk of neurological worsening, death and care requires constant monitoring of vital signs, hemodynamics,respiratory and cardiac monitoring, neurological assessment, discussion with family, other specialists and medical decision making of high complexity. I spent 35 minutes of neurocritical care time  in the care of  this patient.   ASarina Ill MD Neurology [phone]/01/2017, 6:28 PM

## 2017-08-18 ENCOUNTER — Inpatient Hospital Stay: Payer: Non-veteran care

## 2017-08-18 LAB — BLOOD GAS, ARTERIAL
ACID-BASE DEFICIT: 3 mmol/L — AB (ref 0.0–2.0)
BICARBONATE: 23.6 mmol/L (ref 20.0–28.0)
FIO2: 0.4
LHR: 16 {breaths}/min
O2 SAT: 99.2 %
PATIENT TEMPERATURE: 37
PCO2 ART: 48 mmHg (ref 32.0–48.0)
PEEP/CPAP: 5 cmH2O
PH ART: 7.3 — AB (ref 7.350–7.450)
PO2 ART: 152 mmHg — AB (ref 83.0–108.0)
VT: 500 mL

## 2017-08-18 LAB — BASIC METABOLIC PANEL
ANION GAP: 5 (ref 5–15)
BUN: 28 mg/dL — AB (ref 6–20)
CHLORIDE: 109 mmol/L (ref 101–111)
CO2: 24 mmol/L (ref 22–32)
Calcium: 8.1 mg/dL — ABNORMAL LOW (ref 8.9–10.3)
Creatinine, Ser: 0.94 mg/dL (ref 0.61–1.24)
Glucose, Bld: 166 mg/dL — ABNORMAL HIGH (ref 65–99)
POTASSIUM: 4.5 mmol/L (ref 3.5–5.1)
SODIUM: 138 mmol/L (ref 135–145)

## 2017-08-18 LAB — GLUCOSE, CAPILLARY
GLUCOSE-CAPILLARY: 138 mg/dL — AB (ref 65–99)
GLUCOSE-CAPILLARY: 164 mg/dL — AB (ref 65–99)
GLUCOSE-CAPILLARY: 167 mg/dL — AB (ref 65–99)
Glucose-Capillary: 156 mg/dL — ABNORMAL HIGH (ref 65–99)
Glucose-Capillary: 178 mg/dL — ABNORMAL HIGH (ref 65–99)
Glucose-Capillary: 157 mg/dL — ABNORMAL HIGH (ref 65–99)

## 2017-08-18 LAB — CBC
HCT: 25.8 % — ABNORMAL LOW (ref 40.0–52.0)
Hemoglobin: 8.3 g/dL — ABNORMAL LOW (ref 13.0–18.0)
MCH: 26.1 pg (ref 26.0–34.0)
MCHC: 31.9 g/dL — AB (ref 32.0–36.0)
MCV: 81.8 fL (ref 80.0–100.0)
PLATELETS: 433 10*3/uL (ref 150–440)
RBC: 3.16 MIL/uL — ABNORMAL LOW (ref 4.40–5.90)
RDW: 19.4 % — AB (ref 11.5–14.5)
WBC: 12.5 10*3/uL — AB (ref 3.8–10.6)

## 2017-08-18 LAB — URINE DRUG SCREEN, QUALITATIVE (ARMC ONLY)
AMPHETAMINES, UR SCREEN: NOT DETECTED
BENZODIAZEPINE, UR SCRN: POSITIVE — AB
Barbiturates, Ur Screen: NOT DETECTED
Cannabinoid 50 Ng, Ur ~~LOC~~: NOT DETECTED
Cocaine Metabolite,Ur ~~LOC~~: NOT DETECTED
MDMA (ECSTASY) UR SCREEN: NOT DETECTED
Methadone Scn, Ur: NOT DETECTED
OPIATE, UR SCREEN: POSITIVE — AB
Phencyclidine (PCP) Ur S: NOT DETECTED
TRICYCLIC, UR SCREEN: NOT DETECTED

## 2017-08-18 LAB — TRIGLYCERIDES: TRIGLYCERIDES: 92 mg/dL (ref <150)

## 2017-08-18 LAB — MAGNESIUM
Magnesium: 2.2 mg/dL (ref 1.7–2.4)
Magnesium: 2.5 mg/dL — ABNORMAL HIGH (ref 1.7–2.4)

## 2017-08-18 LAB — PHOSPHORUS
Phosphorus: 3.6 mg/dL (ref 2.5–4.6)
Phosphorus: 2.3 mg/dL — ABNORMAL LOW (ref 2.5–4.6)

## 2017-08-18 MED ORDER — LACTULOSE 10 GM/15ML PO SOLN
30.0000 g | Freq: Once | ORAL | Status: AC
  Administered 2017-08-18: 30 g
  Filled 2017-08-18: qty 60

## 2017-08-18 NOTE — Progress Notes (Signed)
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 Will monitor closely.  Will hold off on CT head. Neurology following.  No indication for transfusion at this time. DVT/GI prophylaxis. Full Code.  Family updated at bedside.  Cc: 32 minutes  Dorothyann GibbsPrag Said Rueb MD    SUBJECTIVE:  Patient seen and examined at bedside. Noted acute events overnight. Awake and following commands. Underwent breathing trial but self extubated. On nasal canula.   VITAL SIGNS: Temp:  [97.6 F (36.4 C)-98.5 F (36.9 C)] 98.4 F (36.9 C) (09/03 0700) Pulse Rate:  [52-68] 57 (09/03 0600) Resp:  [10-19] 10 (09/03 0600) BP: (138-160)/(60-79) 153/63 (09/03 0600) SpO2:  [100 %] 100 % (09/03 1211) FiO2 (%):  [30 %-40 %] 30 % (09/03 1211) Weight:  [171 lb 11.8 oz (77.9 kg)-173 lb 15.1 oz (78.9 kg)] 171 lb 11.8 oz (77.9 kg) (09/03 0443)   Intake/Output Summary (Last 24 hours) at 08/18/17 1423 Last data filed at 08/18/17 1331  Gross per 24 hour  Intake          1674.25 ml  Output             1535 ml  Net           139.25 ml    PHYSICAL EXAMINATION:    VS: BP (!) 153/63   Pulse (!) 57   Temp 98.4 F (36.9 C) (Oral)   Resp 10   Ht 5\' 9"  (1.753 m)   Wt 171 lb 11.8 oz (77.9 kg)   SpO2 100%   BMI 25.36 kg/m    General Appearance: No distress  Neuro: alert and moves all extremities HEENT: perla Pulmonary: decreased breath sounds   Cardiovascular: Normal S1,S2.  No m/r/g.   Abdomen: Benign, Soft, non-tender. Skin:   warm, no rashes, no ecchymosis  Extremities: s/p right aka    LABORATORY PANEL:   CBC  Recent Labs Lab 08/18/17 0504  WBC 12.5*  HGB 8.3*    HCT 25.8*  PLT 433    Chemistries   Recent Labs Lab 08/15/17 0536  08/18/17 0424 08/18/17 0504  NA 137  < >  --  138  K 4.9  < >  --  4.5  CL 108  < >  --  109  CO2 19*  < >  --  24  GLUCOSE 208*  < >  --  166*  BUN 17  < >  --  28*  CREATININE 1.11  < >  --  0.94  CALCIUM 8.5*  < >  --  8.1*  MG  --   < > 2.2  --   PHOS  --   < > 3.6  --   AST 19  --   --   --   ALT 5*  --   --   --   ALKPHOS 57  --   --   --   BILITOT 0.2*  --   --   --   < > = values in this interval not displayed.   Recent Labs Lab 08/17/17 1548 08/17/17 1928 08/17/17 2343 08/18/17 0356 08/18/17 0753 08/18/17 1141  GLUCAP 152* 142* 192*  164* 156* 178*    Recent Labs Lab 08/17/17 1030 08/18/17 0310 08/18/17 1315  PHART 7.31* 7.30* 7.34*  PCO2ART 47 48 45  PO2ART 43* 152* 64*    Recent Labs Lab 08/15/17 0536  AST 19  ALT 5*  ALKPHOS 57  BILITOT 0.2*  ALBUMIN 3.1*    Cardiac Enzymes  Recent Labs Lab 08/17/17 1038  TROPONINI 0.17*    RADIOLOGY:  Dg Chest Port 1 View  Result Date: 08/18/2017 CLINICAL DATA:  Endotracheally intubated. EXAM: PORTABLE CHEST 1 VIEW COMPARISON:  One-view chest x-ray 08/16/2017 FINDINGS: Endotracheal tube is stable. The tip of the left IJ line is now directed inferiorly within the SVC. The NG tube courses off the inferior border of the film. The heart is enlarged. Mild edema and left greater than right pleural effusions are stable. Left greater than right basilar airspace disease is stable. IMPRESSION: 1. Stable cardiomegaly, interstitial edema, and left greater than right pleural effusions compatible with congestive heart failure. 2. Left greater than right basilar airspace disease likely reflects atelectasis. 3. The tip of the left IJ line is now directed inferiorly within the SVC. 4. The support apparatus is otherwise stable. Electronically Signed   By: Marin Roberts M.D.   On: 08/18/2017 07:51     08/18/2017

## 2017-08-18 NOTE — Progress Notes (Signed)
Patient self extubated with mittens intact, pt very anxious and agitated, RT and MD notified, pt's vs and breathing wnl, no ss of distress noted, 4lnc o2 placed on the patient. Will monitor closely.

## 2017-08-18 NOTE — Progress Notes (Signed)
Pt. Self extubate,placed on 4 liters of oxygen,hr90,rr20,sat 93. No apparent distress noted at this time.

## 2017-08-18 NOTE — Progress Notes (Signed)
Interval History: Patient remains intubated and sedated but improved mental status. No family at bedside. This is not likely a primary neurologic events, more c/w acute hypoxic resp failure due to pneumina or COPD, cardiac arrest. Planning extubation today, since being seen has been extubated  Reason for Consult:Seizure Referring Physician: Sudini  CC: Seizure  HPI: Blake Villarreal is an 71 y.o. male with multiple medical problems noted to have agonal breathing at [facility].  Patient was found to be breathing 3-5 times a minute. EMS was called. He was bagged briefly and put on a CPAP and brought to the emergency room. Patient slowly woke up. He does not remember the events at [facility].  Was found to have PNA, anemia with hgb of 4.6 and heme positive stools.  Patient was admitted and after arriving to the floor had an episode when patient was noted to be unresponsive with HR in the 40's and hypoxic.  Noted to have some drawing of his limbs.  Felt to be seizure and patient ordered to have Keppra.  Patient recovered and was brought to the ICU where during my evaluation complained continuously of having difficulty breathing despite adequate saturation.  After leaving the room patient became asystolic and unresponsive.  No tonic-clonic activity noted.  With no quick recovery patient given Atropine and bagged.  There was then recovery of HR, saturations and patient slowly became more responsive.  Patient amnestic of events.    Past Medical History:  Diagnosis Date  . Diabetes mellitus without complication (HCC)   . Hypertension   . Intermittent confusion   . PAD (peripheral artery disease) (HCC)     Past Surgical History:  Procedure Laterality Date  . ABOVE KNEE LEG AMPUTATION      Family History  Problem Relation Age of Onset  . CAD Neg Hx     Social History:  reports that he has been smoking.  He has never used smokeless tobacco. He reports that he does not drink alcohol or use  drugs.  Allergies  Allergen Reactions  . Daptomycin   . Iodinated Diagnostic Agents     Medications:  I have reviewed the patient's current medications. Prior to Admission:  Prescriptions Prior to Admission  Medication Sig Dispense Refill Last Dose  . amLODipine (NORVASC) 10 MG tablet Take 10 mg by mouth daily.   08/14/2017 at 0900  . aspirin 81 MG chewable tablet Chew 81 mg by mouth daily.   08/14/2017 at 0900  . atorvastatin (LIPITOR) 20 MG tablet Take 20 mg by mouth daily.   08/14/2017 at 2100  . carvedilol (COREG) 12.5 MG tablet Take 37.5 mg by mouth 2 (two) times daily with a meal.   08/14/2017 at 2100  . Cholecalciferol (VITAMIN D3) 2000 units TABS Take 1 tablet by mouth daily.   08/14/2017 at 0900  . clopidogrel (PLAVIX) 75 MG tablet Take 75 mg by mouth daily.   08/14/2017 at 0900  . docusate sodium (COLACE) 100 MG capsule Take 100 mg by mouth daily as needed.    prn at prn  . doxycycline (ADOXA) 100 MG tablet Take 100 mg by mouth 2 (two) times daily.   08/14/2017 at 2100  . doxycycline (DORYX) 100 MG EC tablet Take 100 mg by mouth 2 (two) times daily.   08/14/2017 at 2100  . gabapentin (NEURONTIN) 100 MG capsule Take 100 mg by mouth 3 (three) times daily.   08/14/2017 at 2100  . hydrocerin (EUCERIN) CREA Apply 1 application  topically 2 (two) times daily.   08/14/2017 at 1600  . insulin glargine (LANTUS) 100 UNIT/ML injection Inject 5 Units into the skin at bedtime.    08/14/2017 at 2100  . insulin regular (NOVOLIN R,HUMULIN R) 100 units/mL injection Inject 4-14 Units into the skin 3 (three) times daily before meals. 0-149 = 0 units, 150-200 = 4 units, 201-250 = 7 units, 251-300 = 9 units, 301-350 = 12 units, 351-400 = 14 units, call MD > 400.   08/14/2017 at 1200  . lisinopril (PRINIVIL,ZESTRIL) 40 MG tablet Take 40 mg by mouth daily.   08/14/2017 at 0900  . Magnesium 400 MG TABS Take 400 mg by mouth daily.   08/14/2017 at 0900  . magnesium oxide (MAG-OX) 400 MG tablet Take 400 mg by mouth  daily.    08/14/2017 at 0900  . metFORMIN (GLUCOPHAGE) 500 MG tablet Take 500 mg by mouth 2 (two) times daily with a meal.   08/14/2017 at 2100  . mirtazapine (REMERON) 7.5 MG tablet Take 7.5 mg by mouth at bedtime.   08/14/2017 at 2100  . omeprazole (PRILOSEC) 20 MG capsule Take 20 mg by mouth daily.   08/14/2017 at 0600  . terazosin (HYTRIN) 5 MG capsule Take 5 mg by mouth at bedtime.   08/14/2017 at 2100  . vitamin B-12 (CYANOCOBALAMIN) 1000 MCG tablet Take 1,000 mcg by mouth daily.   08/14/2017 at 0900  . acetaminophen (TYLENOL) 325 MG tablet Take 650 mg by mouth every 6 (six) hours as needed.   prn at prn  . albuterol (PROVENTIL) (2.5 MG/3ML) 0.083% nebulizer solution Take 2 ampules by nebulization every 6 (six) hours as needed for wheezing.   prn at prn   Scheduled: . budesonide (PULMICORT) nebulizer solution  0.25 mg Nebulization BID  . enoxaparin (LOVENOX) injection  40 mg Subcutaneous Q24H  . insulin aspart  0-15 Units Subcutaneous Q4H  . ipratropium-albuterol  3 mL Nebulization Q6H  . mouth rinse  15 mL Mouth Rinse 10 times per day  . methylPREDNISolone (SOLU-MEDROL) injection  40 mg Intravenous Q12H  . pantoprazole (PROTONIX) IV  40 mg Intravenous Q12H  . sodium chloride flush  3 mL Intravenous Q12H    ROS: History obtained from the patient  General ROS: negative for - chills, fatigue, fever, night sweats, weight gain or weight loss Psychological ROS: negative for - behavioral disorder, hallucinations, memory difficulties, mood swings or suicidal ideation Ophthalmic ROS: negative for - blurry vision, double vision, eye pain or loss of vision ENT ROS: negative for - epistaxis, nasal discharge, oral lesions, sore throat, tinnitus or vertigo Allergy and Immunology ROS: negative for - hives or itchy/watery eyes Hematological and Lymphatic ROS: negative for - bleeding problems, bruising or swollen lymph nodes Endocrine ROS: negative for - galactorrhea, hair pattern changes,  polydipsia/polyuria or temperature intolerance Respiratory ROS: shortness of breath Cardiovascular ROS: negative for - chest pain, dyspnea on exertion, edema or irregular heartbeat Gastrointestinal ROS: negative for - abdominal pain, diarrhea, hematemesis, nausea/vomiting or stool incontinence Genito-Urinary ROS: negative for - dysuria, hematuria, incontinence or urinary frequency/urgency Musculoskeletal ROS: back pain Neurological ROS: as noted in HPI Dermatological ROS: negative for rash and skin lesion changes  Physical Examination: Blood pressure (!) 153/63, pulse (!) 57, temperature 98.9 F (37.2 C), temperature source Oral, resp. rate 10, height 5\' 9"  (1.753 m), weight 77.9 kg (171 lb 11.8 oz), SpO2 100 %.  HEENT-  Normocephalic, no lesions, without obvious abnormality.   Cardiovascular- S1, S2 normal, pulses palpable throughout  Lungs- chest clear, no wheezing, rales, normal symmetric air entry Musculoskeletal-Right AKA Skin-vascular skin changes in the LLE  Neurological Examination   Mental Status: Intubated and sedated, mental status improving grimaces and resists eye opening, opens eyes to sternal rub Cranial Nerves: II: Discs flat bilaterally;  pupils equal, round, reactive to light and accommodation III,IV, VI: Conjugate midline gaze V,VII: face appears symmetric VIII: unable to test IX,X: gag reflex present XI: Unable to test XII: midline tongue Motor: Moves extrmities to painful stim  Tone and bulk:normal tone throughout; no atrophy noted  Deep Tendon Reflexes: 1+ in the upper extremities and absent in the left lower extremity Plantars: Right: AKA   Left: mute Cerebellar: Unable to test Gait: not tested due to AKA   Laboratory Studies:   Basic Metabolic Panel:  Recent Labs Lab 08/15/17 0536 08/15/17 1249 08/15/17 1819 08/16/17 0832 08/17/17 0509 08/17/17 1625 08/18/17 0424 08/18/17 0504 08/18/17 1736  NA 137 137  --  136 137  --   --  138  --    K 4.9 5.2* 5.4* 4.7 4.7  --   --  4.5  --   CL 108 109  --  107 108  --   --  109  --   CO2 19* 20*  --  23 23  --   --  24  --   GLUCOSE 208* 190*  --  108* 142*  --   --  166*  --   BUN 17 20  --  26* 31*  --   --  28*  --   CREATININE 1.11 1.06  --  1.12 1.02  --   --  0.94  --   CALCIUM 8.5* 8.6*  --  7.9* 7.8*  --   --  8.1*  --   MG  --  1.8  --   --   --  2.1 2.2  --  2.5*  PHOS  --  3.8  --   --   --  3.6 3.6  --  2.3*    Liver Function Tests:  Recent Labs Lab 08/15/17 0536  AST 19  ALT 5*  ALKPHOS 57  BILITOT 0.2*  PROT 6.8  ALBUMIN 3.1*   No results for input(s): LIPASE, AMYLASE in the last 168 hours. No results for input(s): AMMONIA in the last 168 hours.  CBC:  Recent Labs Lab 08/15/17 0536 08/15/17 1249  08/16/17 0832 08/16/17 1220 08/16/17 1729 08/17/17 0509 08/18/17 0504  WBC 9.9 15.0*  --  12.6*  --   --  12.8* 12.5*  NEUTROABS 7.1* 13.8*  --   --   --   --   --   --   HGB 4.6* 5.4*  < > 7.6* 8.1* 7.9* 8.0* 8.3*  HCT 15.6* 17.5*  < > 23.3* 24.8* 24.3* 25.2* 25.8*  MCV 76.9* 77.1*  --  80.4  --   --  81.8 81.8  PLT 590* 553*  --  467*  --   --  441* 433  < > = values in this interval not displayed.  Cardiac Enzymes:  Recent Labs Lab 08/15/17 1249 08/15/17 1819 08/16/17 0244 08/17/17 1038  TROPONINI <0.03 0.08* 0.32* 0.17*    BNP: Invalid input(s): POCBNP  CBG:  Recent Labs Lab 08/17/17 2343 08/18/17 0356 08/18/17 0753 08/18/17 1141 08/18/17 1632  GLUCAP 192* 164* 156* 178* 167*    Microbiology: Results for orders placed or performed during the hospital encounter of 08/15/17  Culture, blood (  Routine x 2)     Status: None (Preliminary result)   Collection Time: 08/15/17  5:36 AM  Result Value Ref Range Status   Specimen Description BLOOD LT [facility]  Final   Special Requests NONE  Final   Culture NO GROWTH 3 DAYS  Final   Report Status PENDING  Incomplete  Culture, blood (Routine x 2)     Status: None (Preliminary result)    Collection Time: 08/15/17  5:42 AM  Result Value Ref Range Status   Specimen Description BLOOD RT HAND  Final   Special Requests   Final    BOTTLES DRAWN AEROBIC AND ANAEROBIC Blood Culture adequate volume   Culture NO GROWTH 3 DAYS  Final   Report Status PENDING  Incomplete  MRSA PCR Screening     Status: Abnormal   Collection Time: 08/15/17  8:48 PM  Result Value Ref Range Status   MRSA by PCR POSITIVE (A) NEGATIVE Final    Comment:        The GeneXpert MRSA Assay (FDA approved for NASAL specimens only), is one component of a comprehensive MRSA colonization surveillance program. It is not intended to diagnose MRSA infection nor to guide or monitor treatment for MRSA infections. RESULT CALLED TO, READ BACK BY AND VERIFIED WITH: ERICA TAYLOR AT 2206 08/15/17.PMH   Urine Culture     Status: None   Collection Time: 08/16/17  2:44 AM  Result Value Ref Range Status   Specimen Description URINE, RANDOM  Final   Special Requests NONE  Final   Culture   Final    NO GROWTH Performed at [facility], [address], [place], [place] [address]    Report Status 08/17/2017 FINAL  Final    Coagulation Studies: No results for input(s): LABPROT, INR in the last 72 hours.  Urinalysis:   Recent Labs Lab 08/16/17 0244  COLORURINE YELLOW*  LABSPEC 1.013  PHURINE 5.0  GLUCOSEU NEGATIVE  HGBUR SMALL*  BILIRUBINUR NEGATIVE  KETONESUR NEGATIVE  PROTEINUR NEGATIVE  NITRITE NEGATIVE  LEUKOCYTESUR MODERATE*    Lipid Panel:     Component Value Date/Time   TRIG 65 08/15/2017 1929    HgbA1C: No results found for: HGBA1C  Urine Drug Screen:      Component Value Date/Time   LABOPIA POSITIVE (A) 08/18/2017 1510   COCAINSCRNUR NONE DETECTED 08/18/2017 1510   LABBENZ POSITIVE (A) 08/18/2017 1510   AMPHETMU NONE DETECTED 08/18/2017 1510   THCU NONE DETECTED 08/18/2017 1510   LABBARB NONE DETECTED 08/18/2017 1510    Alcohol Level: No results for input(s): ETH in the last  168 hours.  Other results: EKG: sinus rhythm at 61 bpm.  Imaging: Dg Chest Port 1 View  Result Date: 08/18/2017 CLINICAL DATA:  Endotracheally intubated. EXAM: PORTABLE CHEST 1 VIEW COMPARISON:  One-view chest x-ray 08/16/2017 FINDINGS: Endotracheal tube is stable. The tip of the left IJ line is now directed inferiorly within the SVC. The NG tube courses off the inferior border of the film. The heart is enlarged. Mild edema and left greater than right pleural effusions are stable. Left greater than right basilar airspace disease is stable. IMPRESSION: 1. Stable cardiomegaly, interstitial edema, and left greater than right pleural effusions compatible with congestive heart failure. 2. Left greater than right basilar airspace disease likely reflects atelectasis. 3. The tip of the left IJ line is now directed inferiorly within the SVC. 4. The support apparatus is otherwise stable. Electronically Signed   By: Virl Son.D.  On: 08/18/2017 07:51     Assessment/Plan: 71 year old male admitted after an episode of unresponsiveness as his facility. Admitted for acute hypoxic resp failure due to pneumina or COPD, cardiac arrest. Also questionable seizure activity. Improved, extubated, following commands.   Recommendations: 1. Head CT without contrast once stable, pending.  If unremarkable, MRI of the brain indicated. 2.  Continue Vimpat 200mg  IV 100mg  q 12 hours 3.  Seizure precautions 4.  EEG slowed without seizure activity or epileptiform findings. 5.  Will continue to follow 6 Discussed with Dr. Dorothyann GibbsPrag Gupta, ICU atending  This patient is critically ill and at significant risk of neurological worsening, death and care requires constant monitoring of vital signs, hemodynamics,respiratory and cardiac monitoring, neurological assessment, discussion with family, other specialists and medical decision making of high complexity. I spent 30 minutes of neurocritical care time  in the care of  this  patient.   Naomie DeanAntonia Zaquan Duffner, MD Neurology 08/18/2017, 8:00 PM

## 2017-08-18 NOTE — Progress Notes (Signed)
[facility] [place] [facility]   PATIENT NAME: Blake Villarreal    MR#:  [id number]  DATE OF BIRTH:  [birth year]  SUBJECTIVE:  CHIEF COMPLAINT:   Chief Complaint  Patient presents with  . Shortness of Breath  remains sedated, intubated. On full vent control, waiting for CT head REVIEW OF SYSTEMS:    Review of Systems  Unable to perform ROS: Mental status change   DRUG ALLERGIES:   Allergies  Allergen Reactions  . Daptomycin   . Iodinated Diagnostic Agents    VITALS:  Blood pressure (!) 153/63, pulse (!) 57, temperature 98.4 F (36.9 C), temperature source Oral, resp. rate 10, height 5\' 9"  (1.753 m), weight 77.9 kg (171 lb 11.8 oz), SpO2 100 %.  PHYSICAL EXAMINATION:   Physical Exam  GENERAL:  71 y.o.-year-old patient lying in the bed, intubated EYES: Pupils equal, round, reactive to light  HEENT: Head atraumatic, normocephalic. Oropharynx and nasopharynx clear.  LUNGS: Coarse breath sounds with wheezing CARDIOVASCULAR: S1, S2 normal ABDOMEN: Soft, nontender, nondistended.  EXTREMITIES: No cyanosis. Right below-knee amputation NEUROLOGIC: Sedated and non responsive to stimulus PSYCHIATRIC: unable to assess LABORATORY PANEL:   CBC  Recent Labs Lab 08/18/17 0504  WBC 12.5*  HGB 8.3*  HCT 25.8*  PLT 433   ------------------------------------------------------------------------------------------------------------------ Chemistries   Recent Labs Lab 08/15/17 0536  08/18/17 0424 08/18/17 0504  NA 137  < >  --  138  K 4.9  < >  --  4.5  CL 108  < >  --  109  CO2 19*  < >  --  24  GLUCOSE 208*  < >  --  166*  BUN 17  < >  --  28*  CREATININE 1.11  < >  --  0.94  CALCIUM 8.5*  < >  --  8.1*  MG  --   < > 2.2  --   AST 19  --   --   --   ALT 5*  --   --   --   ALKPHOS 57  --   --   --   BILITOT 0.2*  --   --   --   < > = values in this interval not  displayed. ------------------------------------------------------------------------------------------------------------------  Cardiac Enzymes  Recent Labs Lab 08/17/17 1038  TROPONINI 0.17*   ------------------------------------------------------------------------------------------------------------------  RADIOLOGY:  Dg Chest Port 1 View  Result Date: 08/18/2017 CLINICAL DATA:  Endotracheally intubated. EXAM: PORTABLE CHEST 1 VIEW COMPARISON:  One-view chest x-ray 08/16/2017 FINDINGS: Endotracheal tube is stable. The tip of the left IJ line is now directed inferiorly within the SVC. The NG tube courses off the inferior border of the film. The heart is enlarged. Mild edema and left greater than right pleural effusions are stable. Left greater than right basilar airspace disease is stable. IMPRESSION: 1. Stable cardiomegaly, interstitial edema, and left greater than right pleural effusions compatible with congestive heart failure. 2. Left greater than right basilar airspace disease likely reflects atelectasis. 3. The tip of the left IJ line is now directed inferiorly within the SVC. 4. The support apparatus is otherwise stable. Electronically Signed   By: Marin Roberts M.D.   On: 08/18/2017 07:51   ASSESSMENT AND PLAN:  71 year old male admitted after an episode of unresponsiveness as his facility.  Has had multiple since admission.  Seem to start with SOB then cardiac arrhythmia.  Unclear if this is a primary cardiac versus primary neurological event.  Patient with high  potassium, low hemoglobin and PNA  * Acute metabolic encephalopathy - multifactorial  * New onset seizure - EEG negative for seizures or epileptiform activity. - continue Vimpat 100mg  q 12 hours - Seizure precautions - CT/MRI head per neuro - still pending - d/w nursing to check UDS  * GI bleed with acute blood loss anemia - s/p 1 unit of blood transfusion - Hb 8.3 - Appreciate GI input, recommend conservative  mgmt for now  * Acute COPD exacerbation. On steroids and scheduled nebulizers.  * Acute hypoxic respiratory failure - on full vent support - weaning trial today - mgmt per Intensivist  * Cardiac Arrest etiology unknown- rhythm asystole - cardio seen and following   All the records are reviewed and case discussed with Care Management/Social Worker Management plans discussed with the patient, NURSING, Dr. Chales AbrahamsGupta and they are in agreement.  CODE STATUS: FULL CODE  DVT Prophylaxis: SCDs  TOTAL TIME TAKING CARE OF THIS PATIENT: 15 minutes.   Delfino LovettVipul Luetta Piazza M.D on 08/18/2017 at 1:01 PM  Between 7am to 6pm - Pager - [phone]  After 6pm go to [email or web address] - password [email or web address] [facility]  [facility] [facility]  Office  [phone]  CC: Primary care physician; Keane PoliceSlade-Hartman, Venezela, MD  Note: This dictation was prepared with Dragon dictation along with smaller phrase technology. Any transcriptional errors that result from this process are unintentional.

## 2017-08-19 LAB — CBC WITH DIFFERENTIAL/PLATELET
BASOS ABS: 0 10*3/uL (ref 0–0.1)
BASOS PCT: 0 %
Eosinophils Absolute: 0 10*3/uL (ref 0–0.7)
Eosinophils Relative: 0 %
HEMATOCRIT: 28 % — AB (ref 40.0–52.0)
HEMOGLOBIN: 8.9 g/dL — AB (ref 13.0–18.0)
Lymphocytes Relative: 7 %
Lymphs Abs: 1 10*3/uL (ref 1.0–3.6)
MCH: 25.5 pg — ABNORMAL LOW (ref 26.0–34.0)
MCHC: 31.7 g/dL — ABNORMAL LOW (ref 32.0–36.0)
MCV: 80.5 fL (ref 80.0–100.0)
Monocytes Absolute: 1 10*3/uL (ref 0.2–1.0)
Monocytes Relative: 7 %
NEUTROS ABS: 12.5 10*3/uL — AB (ref 1.4–6.5)
NEUTROS PCT: 86 %
Platelets: 440 10*3/uL (ref 150–440)
RBC: 3.48 MIL/uL — ABNORMAL LOW (ref 4.40–5.90)
RDW: 20.1 % — AB (ref 11.5–14.5)
WBC: 14.4 10*3/uL — ABNORMAL HIGH (ref 3.8–10.6)

## 2017-08-19 LAB — BLOOD GAS, ARTERIAL
Acid-base deficit: 2.6 mmol/L — ABNORMAL HIGH (ref 0.0–2.0)
Acid-base deficit: 1.6 mmol/L (ref 0.0–2.0)
BICARBONATE: 23.7 mmol/L (ref 20.0–28.0)
BICARBONATE: 24.3 mmol/L (ref 20.0–28.0)
FIO2: 0.28
FIO2: 0.3
O2 SAT: 73.4 %
O2 SAT: 90.6 %
PATIENT TEMPERATURE: 37
PATIENT TEMPERATURE: 37
PCO2 ART: 47 mmHg (ref 32.0–48.0)
PCO2 ART: 45 mmHg (ref 32.0–48.0)
PEEP/CPAP: 5 cmH2O
PEEP/CPAP: 5 cmH2O
PH ART: 7.31 — AB (ref 7.350–7.450)
PO2 ART: 43 mmHg — AB (ref 83.0–108.0)
PO2 ART: 64 mmHg — AB (ref 83.0–108.0)
PRESSURE SUPPORT: 5 cmH2O
Pressure support: 5 cmH2O
pH, Arterial: 7.34 — ABNORMAL LOW (ref 7.350–7.450)

## 2017-08-19 LAB — BASIC METABOLIC PANEL
Anion gap: 8 (ref 5–15)
BUN: 29 mg/dL — ABNORMAL HIGH (ref 6–20)
CALCIUM: 9.2 mg/dL (ref 8.9–10.3)
CHLORIDE: 111 mmol/L (ref 101–111)
CO2: 23 mmol/L (ref 22–32)
Creatinine, Ser: 1.01 mg/dL (ref 0.61–1.24)
GFR calc non Af Amer: >60 mL/min (ref >60)
Glucose, Bld: 154 mg/dL — ABNORMAL HIGH (ref 65–99)
POTASSIUM: 3.9 mmol/L (ref 3.5–5.1)
Sodium: 142 mmol/L (ref 135–145)

## 2017-08-19 LAB — GLUCOSE, CAPILLARY
GLUCOSE-CAPILLARY: 134 mg/dL — AB (ref 65–99)
GLUCOSE-CAPILLARY: 136 mg/dL — AB (ref 65–99)
Glucose-Capillary: 106 mg/dL — ABNORMAL HIGH (ref 65–99)
Glucose-Capillary: 136 mg/dL — ABNORMAL HIGH (ref 65–99)
Glucose-Capillary: 148 mg/dL — ABNORMAL HIGH (ref 65–99)
Glucose-Capillary: 160 mg/dL — ABNORMAL HIGH (ref 65–99)
Glucose-Capillary: 160 mg/dL — ABNORMAL HIGH (ref 65–99)

## 2017-08-19 LAB — PROCALCITONIN

## 2017-08-19 MED ORDER — HYDRALAZINE HCL 20 MG/ML IJ SOLN
10.0000 mg | INTRAMUSCULAR | Status: DC | PRN
  Administered 2017-08-19: 20 mg via INTRAVENOUS
  Administered 2017-08-19: 10 mg via INTRAVENOUS
  Filled 2017-08-19: qty 1

## 2017-08-19 MED ORDER — ASPIRIN EC 81 MG PO TBEC
81.0000 mg | DELAYED_RELEASE_TABLET | Freq: Every day | ORAL | Status: DC
  Administered 2017-08-19 – 2017-08-22 (×4): 81 mg via ORAL
  Filled 2017-08-19 (×4): qty 1

## 2017-08-19 MED ORDER — IPRATROPIUM-ALBUTEROL 0.5-2.5 (3) MG/3ML IN SOLN
3.0000 mL | Freq: Three times a day (TID) | RESPIRATORY_TRACT | Status: DC
  Administered 2017-08-20 – 2017-08-22 (×7): 3 mL via RESPIRATORY_TRACT
  Filled 2017-08-19 (×9): qty 3

## 2017-08-19 MED ORDER — SENNOSIDES-DOCUSATE SODIUM 8.6-50 MG PO TABS
2.0000 | ORAL_TABLET | Freq: Two times a day (BID) | ORAL | Status: DC
  Administered 2017-08-19 – 2017-08-22 (×7): 2 via ORAL
  Filled 2017-08-19 (×7): qty 2

## 2017-08-19 MED ORDER — METHYLPREDNISOLONE SODIUM SUCC 40 MG IJ SOLR
20.0000 mg | Freq: Every day | INTRAMUSCULAR | Status: DC
  Administered 2017-08-20 – 2017-08-22 (×3): 20 mg via INTRAVENOUS
  Filled 2017-08-19 (×3): qty 1

## 2017-08-19 MED ORDER — ATORVASTATIN CALCIUM 20 MG PO TABS
20.0000 mg | ORAL_TABLET | Freq: Every day | ORAL | Status: DC
  Administered 2017-08-19 – 2017-08-21 (×3): 20 mg via ORAL
  Filled 2017-08-19 (×3): qty 1

## 2017-08-19 MED ORDER — HYDRALAZINE HCL 20 MG/ML IJ SOLN
INTRAMUSCULAR | Status: AC
  Filled 2017-08-19: qty 1

## 2017-08-19 MED ORDER — LEVOFLOXACIN 750 MG PO TABS
750.0000 mg | ORAL_TABLET | Freq: Every day | ORAL | Status: AC
  Administered 2017-08-19: 16:00:00 750 mg via ORAL
  Filled 2017-08-19 (×2): qty 1

## 2017-08-19 MED ORDER — GLUCERNA SHAKE PO LIQD
237.0000 mL | Freq: Three times a day (TID) | ORAL | Status: DC
  Administered 2017-08-19 – 2017-08-21 (×4): 237 mL via ORAL

## 2017-08-19 MED ORDER — PANTOPRAZOLE SODIUM 40 MG PO TBEC
40.0000 mg | DELAYED_RELEASE_TABLET | Freq: Two times a day (BID) | ORAL | Status: DC
  Administered 2017-08-19 – 2017-08-22 (×6): 40 mg via ORAL
  Filled 2017-08-19 (×6): qty 1

## 2017-08-19 MED ORDER — HYDRALAZINE HCL 50 MG PO TABS
25.0000 mg | ORAL_TABLET | Freq: Three times a day (TID) | ORAL | Status: DC
  Administered 2017-08-19 – 2017-08-22 (×9): 25 mg via ORAL
  Filled 2017-08-19 (×9): qty 1

## 2017-08-19 MED ORDER — INSULIN GLARGINE 100 UNIT/ML ~~LOC~~ SOLN
5.0000 [IU] | Freq: Every day | SUBCUTANEOUS | Status: DC
  Administered 2017-08-20 – 2017-08-21 (×3): 5 [IU] via SUBCUTANEOUS
  Filled 2017-08-19 (×5): qty 0.05

## 2017-08-19 MED ORDER — GABAPENTIN 100 MG PO CAPS
100.0000 mg | ORAL_CAPSULE | Freq: Three times a day (TID) | ORAL | Status: DC
  Administered 2017-08-19 – 2017-08-22 (×9): 100 mg via ORAL
  Filled 2017-08-19 (×9): qty 1

## 2017-08-19 MED ORDER — AMLODIPINE BESYLATE 10 MG PO TABS
10.0000 mg | ORAL_TABLET | Freq: Every day | ORAL | Status: DC
  Administered 2017-08-19 – 2017-08-22 (×4): 10 mg via ORAL
  Filled 2017-08-19 (×4): qty 1

## 2017-08-19 MED ORDER — ADULT MULTIVITAMIN W/MINERALS CH
1.0000 | ORAL_TABLET | Freq: Every day | ORAL | Status: DC
  Administered 2017-08-19 – 2017-08-22 (×4): 1 via ORAL
  Filled 2017-08-19 (×4): qty 1

## 2017-08-19 MED ORDER — LACOSAMIDE 50 MG PO TABS
100.0000 mg | ORAL_TABLET | Freq: Two times a day (BID) | ORAL | Status: DC
  Administered 2017-08-19 – 2017-08-22 (×7): 100 mg via ORAL
  Filled 2017-08-19 (×6): qty 2
  Filled 2017-08-19: qty 1

## 2017-08-19 MED ORDER — CLOPIDOGREL BISULFATE 75 MG PO TABS
75.0000 mg | ORAL_TABLET | Freq: Every day | ORAL | Status: DC
  Administered 2017-08-19 – 2017-08-22 (×4): 75 mg via ORAL
  Filled 2017-08-19 (×4): qty 1

## 2017-08-19 NOTE — Progress Notes (Signed)
Subjective: Patient extubated, alert and following commands.    Objective: Current vital signs: BP (!) 162/80   Pulse 80   Temp 98.4 F (36.9 C) (Oral)   Resp (!) 33   Ht 5\' 9"  (1.753 m)   Wt 77.9 kg (171 lb 11.8 oz)   SpO2 97%   BMI 25.36 kg/m  Vital signs in last 24 hours: Temp:  [97.6 F (36.4 C)-99.2 F (37.3 C)] 98.4 F (36.9 C) (09/04 0700) Pulse Rate:  [65-84] 80 (09/04 0600) Resp:  [8-33] 33 (09/04 0600) BP: (154-176)/(60-80) 162/80 (09/04 0600) SpO2:  [95 %-100 %] 97 % (09/04 0600) Weight:  [77.9 kg (171 lb 11.8 oz)] 77.9 kg (171 lb 11.8 oz) (09/04 0400)  Intake/Output from previous day: 09/03 0701 - 09/04 0700 In: 774.4 [I.V.:239.4; NG/GT:160; IV Piggyback:375] Out: 2890 [Urine:2890] Intake/Output this shift: No intake/output data recorded. Nutritional status: Diet 2 gram sodium Room service appropriate? Yes; Fluid consistency: Thin  Neurologic Exam: Mental Status: Alert, oriented, thought content appropriate.  Speech fluent without evidence of aphasia.  Able to follow 3 step commands without difficulty. Cranial Nerves: II: Discs flat bilaterally; Visual fields grossly normal, pupils equal, round, reactive to light and accommodation III,IV, VI: ptosis not present, extra-ocular motions intact bilaterally V,VII: smile symmetric, facial light touch sensation normal bilaterally VIII: hearing normal bilaterally IX,X: gag reflex present XI: bilateral shoulder shrug XII: midline tongue extension Motor: Right :  Upper extremity   5/5                                      Left:     Upper extremity   5/5             Lower extremity   AKA                                                            Lower extremity   5/5 Tone and bulk:normal tone throughout; no atrophy noted   Lab Results: Basic Metabolic Panel:  Recent Labs Lab 08/15/17 1249 08/15/17 1819 08/16/17 0832 08/17/17 0509 08/17/17 1625 08/18/17 0424 08/18/17 0504 08/18/17 1736 08/19/17 0500  NA  137  --  136 137  --   --  138  --  142  K 5.2* 5.4* 4.7 4.7  --   --  4.5  --  3.9  CL 109  --  107 108  --   --  109  --  111  CO2 20*  --  23 23  --   --  24  --  23  GLUCOSE 190*  --  108* 142*  --   --  166*  --  154*  BUN 20  --  26* 31*  --   --  28*  --  29*  CREATININE 1.06  --  1.12 1.02  --   --  0.94  --  1.01  CALCIUM 8.6*  --  7.9* 7.8*  --   --  8.1*  --  9.2  MG 1.8  --   --   --  2.1 2.2  --  2.5*  --   PHOS 3.8  --   --   --  3.6  3.6  --  2.3*  --     Liver Function Tests:  Recent Labs Lab 08/15/17 0536  AST 19  ALT 5*  ALKPHOS 57  BILITOT 0.2*  PROT 6.8  ALBUMIN 3.1*   No results for input(s): LIPASE, AMYLASE in the last 168 hours. No results for input(s): AMMONIA in the last 168 hours.  CBC:  Recent Labs Lab 08/15/17 0536 08/15/17 1249  08/16/17 0832 08/16/17 1220 08/16/17 1729 08/17/17 0509 08/18/17 0504 08/19/17 0500  WBC 9.9 15.0*  --  12.6*  --   --  12.8* 12.5* 14.4*  NEUTROABS 7.1* 13.8*  --   --   --   --   --   --  12.5*  HGB 4.6* 5.4*  < > 7.6* 8.1* 7.9* 8.0* 8.3* 8.9*  HCT 15.6* 17.5*  < > 23.3* 24.8* 24.3* 25.2* 25.8* 28.0*  MCV 76.9* 77.1*  --  80.4  --   --  81.8 81.8 80.5  PLT 590* 553*  --  467*  --   --  441* 433 440  < > = values in this interval not displayed.  Cardiac Enzymes:  Recent Labs Lab 08/15/17 1249 08/15/17 1819 08/16/17 0244 08/17/17 1038  TROPONINI <0.03 0.08* 0.32* 0.17*    Lipid Panel:  Recent Labs Lab 08/15/17 1929 08/18/17 1910  TRIG 65 92    CBG:  Recent Labs Lab 08/18/17 2004 08/18/17 2349 08/19/17 0348 08/19/17 0746 08/19/17 1112  GLUCAP 157* 138* 160* 134* 136*    Microbiology: Results for orders placed or performed during the hospital encounter of 08/15/17  Culture, blood (Routine x 2)     Status: None (Preliminary result)   Collection Time: 08/15/17  5:36 AM  Result Value Ref Range Status   Specimen Description [facility]OD [facility]  Final   Special Requests NONE  Final   Culture NO  GROWTH 4 DAYS  Final   Report Status PENDING  Incomplete  Culture, blood (Routine x 2)     Status: None (Preliminary result)   Collection Time: 08/15/17  5:42 AM  Result Value Ref Range Status   Specimen Description BLOOD RT HAND  Final   Special Requests   Final    BOTTLES DRAWN AEROBIC AND ANAEROBIC Blood Culture adequate volume   Culture NO GROWTH 4 DAYS  Final   Report Status PENDING  Incomplete  MRSA PCR Screening     Status: Abnormal   Collection Time: 08/15/17  8:48 PM  Result Value Ref Range Status   MRSA by PCR POSITIVE (A) NEGATIVE Final    Comment:        The GeneXpert MRSA Assay (FDA approved for NASAL specimens only), is one component of a comprehensive MRSA colonization surveillance program. It is not intended to diagnose MRSA infection nor to guide or monitor treatment for MRSA infections. RESULT CALLED TO, READ BACK BY AND VERIFIED WITH: ERICA TAYLOR AT 2206 08/15/17.PMH   Urine Culture     Status: None   Collection Time: 08/16/17  2:44 AM  Result Value Ref Range Status   Specimen Description URINE, RANDOM  Final   Special Requests NONE  Final   Culture   Final    NO GROWTH Performed at [facility], [address] [place], [place] [address]    Report Status 08/17/2017 FINAL  Final    Coagulation Studies: No results for input(s): LABPROT, INR in the last 72 hours.  Imaging: Dg Chest Port 1 View  Result Date: 08/18/2017  CLINICAL DATA:  Endotracheally intubated. EXAM: PORTABLE CHEST 1 VIEW COMPARISON:  One-view chest x-ray 08/16/2017 FINDINGS: Endotracheal tube is stable. The tip of the left IJ line is now directed inferiorly within the SVC. The NG tube courses off the inferior border of the film. The heart is enlarged. Mild edema and left greater than right pleural effusions are stable. Left greater than right basilar airspace disease is stable. IMPRESSION: 1. Stable cardiomegaly, interstitial edema, and left greater than right pleural effusions  compatible with congestive heart failure. 2. Left greater than right basilar airspace disease likely reflects atelectasis. 3. The tip of the left IJ line is now directed inferiorly within the SVC. 4. The support apparatus is otherwise stable. Electronically Signed   By: Marin Roberts M.D.   On: 08/18/2017 07:51    Medications:  I have reviewed the patient's current medications. Scheduled: . budesonide (PULMICORT) nebulizer solution  0.25 mg Nebulization BID  . enoxaparin (LOVENOX) injection  40 mg Subcutaneous Q24H  . hydrALAZINE  25 mg Oral Q8H  . insulin aspart  0-15 Units Subcutaneous Q4H  . ipratropium-albuterol  3 mL Nebulization Q6H  . methylPREDNISolone (SOLU-MEDROL) injection  40 mg Intravenous Q12H  . pantoprazole (PROTONIX) IV  40 mg Intravenous Q12H  . sodium chloride flush  3 mL Intravenous Q12H    Assessment/Plan: Patient extubated.  No further events since extubation.  Vimpat discontinued.  EEG was only significant for slowing.  Will continue to follow prn.     LOS: 4 days   Thana Farr, MD Neurology [phone] 08/19/2017  1:01 PM

## 2017-08-19 NOTE — Care Management (Addendum)
RNCM contacted patient's niece Rosey Batheresa [phone] regarding [facility] as [facility] is [facility]. Rosey Batheresa said that patient could make that decision. I presented this information/forms [facility] verbally to patient (he is on isolation) and he said "talk to Foscoeeresa and let her decide".  I called Rosey Batheresa back and she said 'he always does that".  I have faxed both VA forms to [place] for review [phone]. VA forms placed on patient's physical chart- waiting for Rosey Batheresa to call RNCM back. I have left message for Gaylyn RongLaurie Veasey with [facility] for bed status. 1407: RNCM received callback from Gaylyn [facility]Laurie Vease[facility] [place] [place]. I have messaged Teresa. 1440: RNCM received text message from [name] requesting [facility]. Forms completed by this RNCM and witnessed text message from [facility] RN[facility]- faxed to Gaylyn [facility]Laurie Vease[facility]. [facility] I have paged Dr. Sherryll BurgerShah for EMTALA completion. Sherryll BurgerShah has started [employer]. RN updated.

## 2017-08-19 NOTE — Progress Notes (Signed)
Nutrition Follow-up  DOCUMENTATION CODES:   Not applicable  INTERVENTION:  1. Glucerna Shake po TID, each supplement provides 220 kcal and 10 grams of protein 2. Continue MVI w/ Minerals  NUTRITION DIAGNOSIS:   Inadequate oral intake related to inability to eat as evidenced by NPO status. -resolved  GOAL:   Patient will meet greater than or equal to 90% of their needs -new goal  MONITOR:   PO intake, Supplement acceptance, I & O's, Labs, Weight trends  ASSESSMENT:   71 year old male with PMHx of DM type 2, HTN, PAD s/p right AKA, COPD, intermittent confusion, remote hx of EtOH abuse [facility] due to near syncope and respiratory distress, anemia. While on floor patient suffered acute cardiac arrest and acute respiratory failure, was intubated.  Patient resting comfortably at bedside. Tolerating clear liquids this morning. Diet advanced. No acute needs at this time.  Labs reviewed  Medications reviewed and include:  Novolog 0-15 Units every 4 hours, Lantus 5 units HS, Senokot-S   Intake/Output Summary (Last 24 hours) at 08/19/17 1410 Last data filed at 08/19/17 1300  Gross per 24 hour  Intake              375 ml  Output             2890 ml  Net            -2515 ml    Diet Order:  Diet 2 gram sodium Room service appropriate? Yes; Fluid consistency: Thin  Skin:  Wound (see comment) (stg II left buttocks, cellulitis with cracking left leg)  Last BM:  08/16/2017 (Type 4)  Height:   Ht Readings from Last 1 Encounters:  08/15/17 5\' 9"  (1.753 m)    Weight:   Wt Readings from Last 1 Encounters:  08/19/17 171 lb 11.8 oz (77.9 kg)    Ideal Body Weight:  66.9 kg (adjusted for left AKA)  BMI:  Body mass index is 25.36 kg/m.  Estimated Nutritional Needs:   Kcal:  1830-2000 calories (MSJ x1.2-1.3)  Protein:  95-111 grams (1.2-1.4 grams/kg)  Fluid:  1.8-2L  EDUCATION NEEDS:   No education needs identified at this time  Dionne AnoWilliam M.  Eder Macek, MS, RD LDN Inpatient Clinical Dietitian Pager [phone]

## 2017-08-19 NOTE — Progress Notes (Signed)
[facility] Critical Care Medicine Progess Note     ASSESSMENT  Acute on chronic hypoxic respiratory failure secondary to possible pneumonia and AECOPD  Acute encephalopathy - improved Cardiac Arrest etiology unknown- rhythm asystole Anemia with acute blood loss  Lactic acidosis  Hx: Type II DM, PVD s/p Right AKA and bypass on left  PLAN Patient self extubated and on nasal canula. Will monitor closely.  Will hold off on CT head. Neurology following.  No indication for transfusion at this time. DVT/GI prophylaxis. Full Code.  Family updated at bedside.      SUBJECTIVE:  Patient seen and examined at bedside. Noted acute events overnight. Awake and following commands. Underwent breathing trial but self extubated. On nasal canula.   VITAL SIGNS: Temp:  [97.6 F (36.4 C)-99.2 F (37.3 C)] 98.4 F (36.9 C) (09/04 0700) Pulse Rate:  [65-84] 80 (09/04 0600) Resp:  [8-33] 33 (09/04 0600) BP: (154-176)/(60-80) 162/80 (09/04 0600) SpO2:  [95 %-100 %] 97 % (09/04 0600) FiO2 (%):  [30 %] 30 % (09/03 1211) Weight:  [171 lb 11.8 oz (77.9 kg)] 171 lb 11.8 oz (77.9 kg) (09/04 0400)   Intake/Output Summary (Last 24 hours) at 08/19/17 1053 Last data filed at 08/19/17 0500  Gross per 24 hour  Intake            477.6 ml  Output             2890 ml  Net          -2412.4 ml    PHYSICAL EXAMINATION:    VS: BP (!) 162/80   Pulse 80   Temp 98.4 F (36.9 C) (Oral)   Resp (!) 33   Ht 5\' 9"  (1.753 m)   Wt 171 lb 11.8 oz (77.9 kg)   SpO2 97%   BMI 25.36 kg/m    General Appearance: No distress  Neuro: alert and moves all extremities HEENT: perla Pulmonary: decreased breath sounds   Cardiovascular: Normal S1,S2.  No m/r/g.   Abdomen: Benign, Soft, non-tender. Skin:   warm, no rashes, no ecchymosis  Extremities: s/p right aka    LABORATORY PANEL:   CBC  Recent Labs Lab 08/19/17 0500  WBC 14.4*  HGB 8.9*  HCT 28.0*  PLT 440    Chemistries   Recent Labs Lab  08/15/17 0536  08/18/17 1736 08/19/17 0500  NA 137  < >  --  142  K 4.9  < >  --  3.9  CL 108  < >  --  111  CO2 19*  < >  --  23  GLUCOSE 208*  < >  --  154*  BUN 17  < >  --  29*  CREATININE 1.11  < >  --  1.01  CALCIUM 8.5*  < >  --  9.2  MG  --   < > 2.5*  --   PHOS  --   < > 2.3*  --   AST 19  --   --   --   ALT 5*  --   --   --   ALKPHOS 57  --   --   --   BILITOT 0.2*  --   --   --   < > = values in this interval not displayed.   Recent Labs Lab 08/18/17 1141 08/18/17 1632 08/18/17 2004 08/18/17 2349 08/19/17 0348 08/19/17 0746  GLUCAP 178* 167* 157* 138* 160* 134*    Recent Labs Lab 08/17/17 1030  08/18/17 0310 08/18/17 1315  PHART 7.31* 7.30* 7.34*  PCO2ART 47 48 45  PO2ART 43* 152* 64*    Recent Labs Lab 08/15/17 0536  AST 19  ALT 5*  ALKPHOS 57  BILITOT 0.2*  ALBUMIN 3.1*    Cardiac Enzymes  Recent Labs Lab 08/17/17 1038  TROPONINI 0.17*    Ok to transfer to gen med floor  Audreena Sachdeva Santiago Gladavid Nohely Whitehorn, M.D.  [facility]  Medical Director [facility] Medical Director [facility]io[facility]

## 2017-08-19 NOTE — Progress Notes (Signed)
[facility] [place] [facility]   PATIENT NAME: Blake Villarreal    MR#:  [id number]  DATE OF BIRTH:  11/01/1946  SUBJECTIVE:  CHIEF COMPLAINT:   Chief Complaint  Patient presents with  . Shortness of Breath  self extubated last evening. On room air now. REVIEW OF SYSTEMS:    Review of Systems  Unable to perform ROS: Mental status change   DRUG ALLERGIES:   Allergies  Allergen Reactions  . Daptomycin   . Iodinated Diagnostic Agents    VITALS:  Blood pressure 140/78, pulse 91, temperature 98.8 F (37.1 C), temperature source Oral, resp. rate 20, height 5\' 9"  (1.753 m), weight 77.9 kg (171 lb 11.8 oz), SpO2 99 %. PHYSICAL EXAMINATION:   Physical Exam  GENERAL:  71 y.o.-year-old patient lying in the bed EYES: Pupils equal, round, reactive to light  HEENT: Head atraumatic, normocephalic. Oropharynx and nasopharynx clear.  LUNGS: Decreased breath sounds, no wheezing CARDIOVASCULAR: S1, S2 normal ABDOMEN: Soft, nontender, nondistended.  EXTREMITIES: No cyanosis. Right below-knee amputation NEUROLOGIC: Alert, oriented, thought content appropriate. Speech fluent without evidence of aphasia.  PSYCHIATRIC: mood normal LABORATORY PANEL:   CBC  Recent Labs Lab 08/19/17 0500  WBC 14.4*  HGB 8.9*  HCT 28.0*  PLT 440   ------------------------------------------------------------------------------------------------------------------ Chemistries   Recent Labs Lab 08/15/17 0536  08/18/17 1736 08/19/17 0500  NA 137  < >  --  142  K 4.9  < >  --  3.9  CL 108  < >  --  111  CO2 19*  < >  --  23  GLUCOSE 208*  < >  --  154*  BUN 17  < >  --  29*  CREATININE 1.11  < >  --  1.01  CALCIUM 8.5*  < >  --  9.2  MG  --   < > 2.5*  --   AST 19  --   --   --   ALT 5*  --   --   --   ALKPHOS 57  --   --   --   BILITOT 0.2*  --   --   --   < > = values in this interval not  displayed. ------------------------------------------------------------------------------------------------------------------  Cardiac Enzymes  Recent Labs Lab 08/17/17 1038  TROPONINI 0.17*   ------------------------------------------------------------------------------------------------------------------  RADIOLOGY:  Dg Chest Port 1 View  Result Date: 08/18/2017 CLINICAL DATA:  Endotracheally intubated. EXAM: PORTABLE CHEST 1 VIEW COMPARISON:  One-view chest x-ray 08/16/2017 FINDINGS: Endotracheal tube is stable. The tip of the left IJ line is now directed inferiorly within the SVC. The NG tube courses off the inferior border of the film. The heart is enlarged. Mild edema and left greater than right pleural effusions are stable. Left greater than right basilar airspace disease is stable. IMPRESSION: 1. Stable cardiomegaly, interstitial edema, and left greater than right pleural effusions compatible with congestive heart failure. 2. Left greater than right basilar airspace disease likely reflects atelectasis. 3. The tip of the left IJ line is now directed inferiorly within the SVC. 4. The support apparatus is otherwise stable. Electronically Signed   By: Marin Robertshristopher  Mattern M.D.   On: 08/18/2017 07:51   ASSESSMENT AND PLAN:  71 year old male admitted after an episode of unresponsiveness as his facility.  Has had multiple since admission.  Seem to start with SOB then cardiac arrhythmia.  Unclear if this is a primary cardiac versus primary neurological event.  Patient with high potassium, low  hemoglobin and PNA  * Acute metabolic encephalopathy - multifactorial, seem to have resolved - EEG negative for seizures or epileptiform activity. - Vimpat discontinued - Seizure precautions - unsure etio for his episode  * GI bleed with acute blood loss anemia - s/p 1 unit of blood transfusion - Hb 8.9 - Appreciate GI input, recommend conservative mgmt for now  * Acute COPD exacerbation. On  steroids and scheduled nebulizers.  * Acute hypoxic respiratory failure - resolved now. Self extubated y'day  * Cardiac Arrest etiology unknown- rhythm asystole - cardio seen and doesn't feel it's cardiac   At This point I do not know exact etiology of what led him to this event I have talked with patient's daughter Blake Villarreal and updated her.  Care management notified that patient is a [name] patient and may go there if they have bed available.  I have done EMTALA papers   All the records are reviewed and case discussed with Care Management/Social Worker Management plans discussed with the patient, NURSING, Dr. Thad Ranger and they are in agreement.  CODE STATUS: FULL CODE  DVT Prophylaxis: SCDs  TOTAL TIME TAKING CARE OF THIS PATIENT: 25 minutes.   Delfino Lovett M.D on 08/19/2017 at 3:16 PM  Between 7am to 6pm - Pager - [phone]  After 6pm go to [email or web address] - password [email or web address] [facility]  [facility] [place] [facility]  Office  [phone]  CC: Primary care physician; Keane Police, MD  Note: This dictation was prepared with Dragon dictation along with smaller phrase technology. Any transcriptional errors that result from this process are unintentional.

## 2017-08-19 NOTE — Progress Notes (Signed)
Patient transferred to 1c, report given to [facility], no ss of distress noted.

## 2017-08-20 ENCOUNTER — Inpatient Hospital Stay: Payer: Non-veteran care

## 2017-08-20 DIAGNOSIS — J189 Pneumonia, unspecified organism: Secondary | ICD-10-CM

## 2017-08-20 DIAGNOSIS — Z515 Encounter for palliative care: Secondary | ICD-10-CM

## 2017-08-20 DIAGNOSIS — Z7189 Other specified counseling: Secondary | ICD-10-CM

## 2017-08-20 LAB — GLUCOSE, CAPILLARY
GLUCOSE-CAPILLARY: 133 mg/dL — AB (ref 65–99)
GLUCOSE-CAPILLARY: 115 mg/dL — AB (ref 65–99)
GLUCOSE-CAPILLARY: 129 mg/dL — AB (ref 65–99)
Glucose-Capillary: 132 mg/dL — ABNORMAL HIGH (ref 65–99)
Glucose-Capillary: 238 mg/dL — ABNORMAL HIGH (ref 65–99)

## 2017-08-20 LAB — CULTURE, BLOOD (ROUTINE X 2)
Culture: NO GROWTH
Culture: NO GROWTH
Special Requests: ADEQUATE

## 2017-08-20 MED ORDER — CHLORHEXIDINE GLUCONATE CLOTH 2 % EX PADS
6.0000 | MEDICATED_PAD | Freq: Every day | CUTANEOUS | Status: DC
  Administered 2017-08-21 – 2017-08-22 (×2): 6 via TOPICAL

## 2017-08-20 MED ORDER — MUPIROCIN 2 % EX OINT
1.0000 "application " | TOPICAL_OINTMENT | Freq: Two times a day (BID) | CUTANEOUS | Status: DC
  Administered 2017-08-20 – 2017-08-22 (×4): 1 via NASAL
  Filled 2017-08-20: qty 22

## 2017-08-20 NOTE — Progress Notes (Signed)
[facility] - [place] at [facility]   PATIENT NAME: Blake Villarreal    MR#:  [id number]  DATE OF BIRTH:  11/01/1946  SUBJECTIVE:  CHIEF COMPLAINT:   Chief Complaint  Patient presents with  . Shortness of Breath  Slow in response, feeling somewhat better REVIEW OF SYSTEMS:    Review of Systems  Constitutional: Negative for chills, fever and weight loss.  HENT: Negative for nosebleeds and sore throat.   Eyes: Negative for blurred vision.  Respiratory: Negative for cough, shortness of breath and wheezing.   Cardiovascular: Negative for chest pain, orthopnea, leg swelling and PND.  Gastrointestinal: Negative for abdominal pain, constipation, diarrhea, heartburn, nausea and vomiting.  Genitourinary: Negative for dysuria and urgency.  Musculoskeletal: Negative for back pain.  Skin: Negative for rash.  Neurological: Negative for dizziness, speech change, focal weakness and headaches.  Endo/Heme/Allergies: Does not bruise/bleed easily.  Psychiatric/Behavioral: Negative for depression.   DRUG ALLERGIES:   Allergies  Allergen Reactions  . Daptomycin   . Iodinated Diagnostic Agents    VITALS:  Blood pressure (!) 175/61, pulse 80, temperature 98.2 F (36.8 C), temperature source Oral, resp. rate 20, height 5\' 9"  (1.753 m), weight 78.3 kg (172 lb 9.6 oz), SpO2 96 %. PHYSICAL EXAMINATION:   Physical Exam  Constitutional: He is oriented to person, place, and time and well-developed, well-nourished, and in no distress.  HENT:  Head: Normocephalic and atraumatic.  Eyes: Pupils are equal, round, and reactive to light. Conjunctivae and EOM are normal.  Neck: Normal range of motion. Neck supple. No tracheal deviation present. No thyromegaly present.  Cardiovascular: Normal rate, regular rhythm and normal heart sounds.   Pulmonary/Chest: Effort normal and breath sounds normal. No respiratory distress. He has no wheezes. He exhibits no tenderness.  Abdominal: Soft. Bowel  sounds are normal. He exhibits no distension. There is no tenderness.  Musculoskeletal: Normal range of motion.  Lower extremity AKA       Neurological: He is alert and oriented to person, place, and time. No cranial nerve deficit.  Skin: Skin is warm and dry. No rash noted.  Psychiatric: Mood and affect normal.   LABORATORY PANEL:   CBC  Recent Labs Lab 08/19/17 0500  WBC 14.4*  HGB 8.9*  HCT 28.0*  PLT 440   ------------------------------------------------------------------------------------------------------------------ Chemistries   Recent Labs Lab 08/15/17 0536  08/18/17 1736 08/19/17 0500  NA 137  < >  --  142  K 4.9  < >  --  3.9  CL 108  < >  --  111  CO2 19*  < >  --  23  GLUCOSE 208*  < >  --  154*  BUN 17  < >  --  29*  CREATININE 1.11  < >  --  1.01  CALCIUM 8.5*  < >  --  9.2  MG  --   < > 2.5*  --   AST 19  --   --   --   ALT 5*  --   --   --   ALKPHOS 57  --   --   --   BILITOT 0.2*  --   --   --   < > = values in this interval not displayed. ------------------------------------------------------------------------------------------------------------------  Cardiac Enzymes  Recent Labs Lab 08/17/17 1038  TROPONINI 0.17*   ------------------------------------------------------------------------------------------------------------------  RADIOLOGY:  Mr Brain Wo Contrast  Result Date: 08/20/2017 CLINICAL DATA:  Acute presentation with episode of unresponsiveness. EXAM: MRI HEAD WITHOUT CONTRAST TECHNIQUE:  Multiplanar, multiecho pulse sequences of the brain and surrounding structures were obtained without intravenous contrast. COMPARISON:  CT 09/07/2010 FINDINGS: Brain: Diffusion imaging does not show any acute or subacute infarction. There are chronic small-vessel ischemic changes of the pons. No cerebellar abnormality. 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 There are mild chronic small-vessel ischemic changes of the  hemispheric white matter. No large vessel territory infarction. No mass lesion, hemorrhage, hydrocephalus or extra-axial collection. Mesial temporal lobes appear symmetric and within normal limits. Vascular: Major vessels at the base of the brain show flow. Skull and upper cervical spine: Negative Sinuses/Orbits: Left maxillary sinus opacification. Other sinuses clear. Orbits negative. Other: None IMPRESSION: No acute intracranial finding. Chronic small-vessel ischemic changes affecting the pons, thalami and hemispheric deep white matter. Left maxillary sinusitis. Electronically Signed   By: Paulina Fusi M.D.   On: 08/20/2017 12:50   ASSESSMENT AND PLAN:  71 year old male admitted after an episode of unresponsiveness as his facility.  Has had multiple since admission.  Seem to start with SOB then cardiac arrhythmia.  Unclear if this is a primary cardiac versus primary neurological event.  Patient with high potassium, low hemoglobin and PNA  * Acute metabolic encephalopathy - multifactorial, seem to have resolved - EEG negative for seizures or epileptiform activity. -MRI brain negative, performed today - Vimpat discontinued - Seizure precautions - unsure etio for his episode  * GI bleed with acute blood loss anemia - s/p 1 unit of blood transfusion - Hb 8.9 - Appreciate GI input, recommend conservative mgmt for now  * Acute COPD exacerbation. On steroids and scheduled nebulizers.  * Acute hypoxic respiratory failure - resolved now. Self extubated on 9/3, maintaining oxygenation on room air  * Cardiac Arrest etiology unknown- rhythm asystole - cardio seen and doesn't feel it's cardiac   Has been accepted by Dr. Keturah Barre at [facility] management notified that patient is a [name] patient and may go there if they have bed available.  I have done EMTALA papers   All the records are reviewed and case discussed with Care Management/Social Worker Management plans discussed with the patient,  NURSING, Dr. Thad Ranger and they are in agreement.  CODE STATUS: FULL CODE  DVT Prophylaxis: SCDs  TOTAL TIME TAKING CARE OF THIS PATIENT: 25 minutes.   Delfino Lovett M.D on 08/20/2017 at 4:26 PM  Between 7am to 6pm - Pager - [phone]  After 6pm go to [email or web address] - password [email or web address] [facility]  [facility] [place] [facility]  Office  [phone]  CC: Primary care physician; Keane Police, MD  Note: This dictation was prepared with Dragon dictation along with smaller phrase technology. Any transcriptional errors that result from this process are unintentional.

## 2017-08-20 NOTE — Care Management (Signed)
Update faxed to Gaylyn RongLaurie Veasey at [facility]. States their doctor's will review this information and contact Dr. Sherryll BurgerShah. Gwenette GreetBrenda S Ramces Shomaker RN MSN CCM Care Management [phone]

## 2017-08-20 NOTE — Care Management (Signed)
This RNCM received call from Gaylyn RongLaurie Veasey with [facility] that [facility] provider never called them back to review case yesterday "because it was so late in the afternoon".  Jacki ConesLaurie will contact RNCM following case this morning for update.

## 2017-08-20 NOTE — Care Management (Signed)
Received telephone call from [place] at [facility]. Accepted Mr. Blake Villarreal for transfer, will call floor as soon as there is a bed available.  Gwenette GreetBrenda S Nalea Salce RN MSN CCM Care Management [phone]

## 2017-08-20 NOTE — Progress Notes (Addendum)
Note charted on wrong patient.

## 2017-08-20 NOTE — Consult Note (Signed)
Consultation Note Date: 08/20/2017   Patient Name: Blake Villarreal  DOB: Jun 25, 1946  MRN: [id number]  Age / Sex: 71 y.o., male  PCP: Alvester Morin, MD Referring Physician: Max Sane, MD  Reason for Consultation: Establishing goals of care  HPI/Patient Profile: 71 y.o. 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   Clinical Assessment and Goals of Care: I met today at Blake Villarreal bedside, no family/visitors present. Blake Villarreal is a very pleasant gentleman. He tells me that he understands "I died and they brought me back." When I mention that he has been through a lot this admission he roles his eyes and says "boy have I." He also tells me that today has been busy and says he had an MRI done. He seems to have very good understanding of his condition and hospital stay. He does not remember much but understands what he has been told and from what he does remember it was a bad experience.   We discussed more about QOL and if this were to happen to him again. He endorses good QOL at this time and is hopeful to return to [name] to continue to gain some strength and independence (he has been there ~1.5 years). He says that he is glad he is alive so he does not regret what he has been through. When I asked if this is ever something he would desire to go through in the future he says "I'm not sure." He has concluded that this depends on the circumstances, expectations, and QOL. He would not desire to be resuscitated if he were declining and we expect poor QOL.   Very pleasant gentleman. Assisted him with cleaning after urination and with his  dentures.   Primary Decision Maker He designates cousin Helene Kelp as his Air traffic controller and relies heavily on her.     SUMMARY OF RECOMMENDATIONS   - He is hopeful for improvement - Would desire DNR if poor outcome or QOL expected - will continue to discuss  Code Status/Advance Care Planning:  FULL   Symptom Management:   No current symptoms to manage.   Palliative Prophylaxis:   Aspiration, Bowel Regimen, Frequent Pain Assessment and Turn Reposition  Additional Recommendations (Limitations, Scope, Preferences):  Full Scope Treatment  Psycho-social/Spiritual:   Desire for further Chaplaincy support:no  Additional Recommendations: Caregiving  Support/Resources  Prognosis:   Unable to determine  Discharge Planning: TBD     Primary Diagnoses: Present on Admission: . Anemia   I have reviewed the medical record, interviewed the patient and family, and examined the patient. The following aspects are pertinent.  Past Medical History:  Diagnosis Date  . Diabetes mellitus without complication ([place])   . Hypertension   . Intermittent confusion   . PAD (peripheral artery disease) ([place])    Social  History   Social History  . Marital status: Divorced    Spouse name: N/A  . Number of children: N/A  . Years of education: N/A   Social History Main Topics  . Smoking status: Current Every Day Smoker  . Smokeless tobacco: Never Used  . Alcohol use No     Comment: Drank heavily in the past per his cousin  . Drug use: No  . Sexual activity: Not Asked   Other Topics Concern  . None   Social History Narrative  . None   Family History  Problem Relation Age of Onset  . CAD Neg Hx    Scheduled Meds: . amLODipine  10 mg Oral Daily  . aspirin EC  81 mg Oral Daily  . atorvastatin  20 mg Oral q1800  . budesonide (PULMICORT) nebulizer solution  0.25 mg Nebulization BID  . clopidogrel  75 mg Oral Daily  . enoxaparin (LOVENOX) injection  40 mg Subcutaneous  Q24H  . feeding supplement (GLUCERNA SHAKE)  237 mL Oral TID BM  . gabapentin  100 mg Oral TID  . hydrALAZINE  25 mg Oral Q8H  . insulin aspart  0-15 Units Subcutaneous Q4H  . insulin glargine  5 Units Subcutaneous QHS  . ipratropium-albuterol  3 mL Nebulization TID  . lacosamide  100 mg Oral BID  . methylPREDNISolone (SOLU-MEDROL) injection  20 mg Intravenous Daily  . multivitamin with minerals  1 tablet Oral Daily  . pantoprazole  40 mg Oral BID AC  . senna-docusate  2 tablet Oral BID  . sodium chloride flush  3 mL Intravenous Q12H   Continuous Infusions: PRN Meds:.acetaminophen **OR** acetaminophen, albuterol, hydrALAZINE, LORazepam, ondansetron **OR** ondansetron (ZOFRAN) IV, oxyCODONE, polyethylene glycol Allergies  Allergen Reactions  . Daptomycin   . Iodinated Diagnostic Agents    Review of Systems  Unable to perform ROS   Physical Exam  Constitutional: He is oriented to person, place, and time. He appears well-developed.  HENT:  Head: Normocephalic and atraumatic.  Cardiovascular: Normal rate and regular rhythm.   Pulmonary/Chest: Effort normal. No accessory muscle usage. No tachypnea. No respiratory distress.  Abdominal: Normal appearance.  Neurological: He is alert and oriented to person, place, and time.  Nursing note and vitals reviewed.   Vital Signs: BP (!) 178/62 (BP Location: Left Arm)   Pulse 66   Temp 98.3 F (36.8 C)   Resp 20   Ht 5' 9"  (1.753 m)   Wt 78.3 kg (172 lb 9.6 oz)   SpO2 96%   BMI 25.49 kg/m  Pain Assessment: No/denies pain   Pain Score: 0-No pain   SpO2: SpO2: 96 % O2 Device:SpO2: 96 % O2 Flow Rate: .O2 Flow Rate (L/min): 2 L/min  IO: Intake/output summary:  Intake/Output Summary (Last 24 hours) at 08/20/17 1452 Last data filed at 08/19/17 2137  Gross per 24 hour  Intake                0 ml  Output              702 ml  Net             -702 ml    LBM: Last BM Date: 08/20/17 Baseline Weight: Weight: 77.1 kg (170 lb) Most  recent weight: Weight: 78.3 kg (172 lb 9.6 oz)     Palliative Assessment/Data: 30%     Time Total: 70 min  Greater than 50%  of this time was spent counseling and coordinating care related to  the above assessment and plan.  Signed by: Vinie Sill, NP Palliative Medicine Team Pager # [phone] (M-F 8a-5p) Team Phone # [phone] (Nights/Weekends)

## 2017-08-21 DIAGNOSIS — Z7189 Other specified counseling: Secondary | ICD-10-CM

## 2017-08-21 DIAGNOSIS — J189 Pneumonia, unspecified organism: Secondary | ICD-10-CM

## 2017-08-21 DIAGNOSIS — Z515 Encounter for palliative care: Secondary | ICD-10-CM

## 2017-08-21 LAB — GLUCOSE, CAPILLARY
GLUCOSE-CAPILLARY: 243 mg/dL — AB (ref 65–99)
GLUCOSE-CAPILLARY: 74 mg/dL (ref 65–99)
Glucose-Capillary: 97 mg/dL (ref 65–99)
Glucose-Capillary: 105 mg/dL — ABNORMAL HIGH (ref 65–99)
Glucose-Capillary: 245 mg/dL — ABNORMAL HIGH (ref 65–99)
Glucose-Capillary: 327 mg/dL — ABNORMAL HIGH (ref 65–99)

## 2017-08-21 LAB — CBC
HEMATOCRIT: 30.5 % — AB (ref 40.0–52.0)
Hemoglobin: 9.8 g/dL — ABNORMAL LOW (ref 13.0–18.0)
MCH: 25.3 pg — AB (ref 26.0–34.0)
MCHC: 32 g/dL (ref 32.0–36.0)
MCV: 79 fL — AB (ref 80.0–100.0)
PLATELETS: 378 10*3/uL (ref 150–440)
RBC: 3.86 MIL/uL — ABNORMAL LOW (ref 4.40–5.90)
RDW: 20.5 % — AB (ref 11.5–14.5)
WBC: 13.6 10*3/uL — AB (ref 3.8–10.6)

## 2017-08-21 NOTE — Progress Notes (Signed)
[facility] - [place] at [facility]   PATIENT NAME: Blake Villarreal    MR#:  [id number]  DATE OF BIRTH:  1946-07-13  SUBJECTIVE:  CHIEF COMPLAINT:   Chief Complaint  Patient presents with  . Shortness of Breath  No new issues, had bowel movement yesterday, request a call to her family member to decide disposition REVIEW OF SYSTEMS:    Review of Systems  Constitutional: Negative for chills, fever and weight loss.  HENT: Negative for nosebleeds and sore throat.   Eyes: Negative for blurred vision.  Respiratory: Negative for cough, shortness of breath and wheezing.   Cardiovascular: Negative for chest pain, orthopnea, leg swelling and PND.  Gastrointestinal: Negative for abdominal pain, constipation, diarrhea, heartburn, nausea and vomiting.  Genitourinary: Negative for dysuria and urgency.  Musculoskeletal: Negative for back pain.  Skin: Negative for rash.  Neurological: Negative for dizziness, speech change, focal weakness and headaches.  Endo/Heme/Allergies: Does not bruise/bleed easily.  Psychiatric/Behavioral: Negative for depression.   DRUG ALLERGIES:   Allergies  Allergen Reactions  . Daptomycin   . Iodinated Diagnostic Agents    VITALS:  Blood pressure (!) 159/65, pulse 63, temperature 98 F (36.7 C), temperature source Oral, resp. rate 15, height  (1.753 m), weight 75.8 kg (167 lb 1.6 oz), SpO2 100 %. PHYSICAL EXAMINATION:   Physical Exam  Constitutional: He is oriented to person, place, and time and well-developed, well-nourished, and in no distress.  HENT:  Head: Normocephalic and atraumatic.  Eyes: Pupils are equal, round, and reactive to light. Conjunctivae and EOM are normal.  Neck: Normal range of motion. Neck supple. No tracheal deviation present. No thyromegaly present.  Cardiovascular: Normal rate, regular rhythm and normal heart sounds.   Pulmonary/Chest: Effort normal and breath sounds normal. No respiratory distress. He has no  wheezes. He exhibits no tenderness.  Abdominal: Soft. Bowel sounds are normal. He exhibits no distension. There is no tenderness.  Musculoskeletal: Normal range of motion.  Lower extremity AKA       Neurological: He is alert and oriented to person, place, and time. No cranial nerve deficit.  Skin: Skin is warm and dry. No rash noted.  Psychiatric: Mood and affect normal.   LABORATORY PANEL:   CBC  Recent Labs Lab 08/21/17 1126  WBC 13.6*  HGB 9.8*  HCT 30.5*  PLT 378   ------------------------------------------------------------------------------------------------------------------ Chemistries   Recent Labs Lab 08/15/17 0536  08/18/17 1736 08/19/17 0500  NA 137  < >  --  142  K 4.9  < >  --  3.9  CL 108  < >  --  111  CO2 19*  < >  --  23  GLUCOSE 208*  < >  --  154*  BUN 17  < >  --  29*  CREATININE 1.11  < >  --  1.01  CALCIUM 8.5*  < >  --  9.2  MG  --   < > 2.5*  --   AST 19  --   --   --   ALT 5*  --   --   --   ALKPHOS 57  --   --   --   BILITOT 0.2*  --   --   --   < > = values in this interval not displayed. ------------------------------------------------------------------------------------------------------------------  Cardiac Enzymes  Recent Labs Lab 08/17/17 1038  TROPONINI 0.17*   ------------------------------------------------------------------------------------------------------------------  RADIOLOGY:  Mr Brain Wo Contrast  Result Date: 08/20/2017 CLINICAL DATA:  Acute  presentation with episode of unresponsiveness. EXAM: MRI HEAD WITHOUT CONTRAST TECHNIQUE: Multiplanar, multiecho pulse sequences of the brain and surrounding structures were obtained without intravenous contrast. COMPARISON:  CT 09/07/2010 FINDINGS: Brain: Diffusion imaging does not show any acute or subacute infarction. There are chronic small-vessel ischemic changes of the pons. No cerebellar abnormality. Cerebral hemispheres show old small vessel infarctions within the  thalami. There are mild chronic small-vessel ischemic changes of the hemispheric white matter. No large vessel territory infarction. No mass lesion, hemorrhage, hydrocephalus or extra-axial collection. Mesial temporal lobes appear symmetric and within normal limits. Vascular: Major vessels at the base of the brain show flow. Skull and upper cervical spine: Negative Sinuses/Orbits: Left maxillary sinus opacification. Other sinuses clear. Orbits negative. Other: None IMPRESSION: No acute intracranial finding. Chronic small-vessel ischemic changes affecting the pons, thalami and hemispheric deep white matter. Left maxillary sinusitis. Electronically Signed   By: Paulina FusiMark  Shogry M.D.   On: 08/20/2017 12:50   ASSESSMENT AND PLAN:  71 year old male admitted after an episode of unresponsiveness as his facility.  Has had multiple since admission.  Seem to start with SOB then cardiac arrhythmia.  Unclear if this is a primary cardiac versus primary neurological event.  Patient with high potassium, low hemoglobin and PNA  * Acute metabolic encephalopathy - multifactorial, seem to have resolved - EEG negative for seizures or epileptiform activity. - MRI brain negative, performed today - Continue Vimpat 100 mg p.o. twice daily per neurology. - Seizure precautions - unsure etio for his episode  * GI bleed with acute blood loss anemia - s/p 1 unit of blood transfusion - Hb 9.8 - Appreciate GI input, recommend conservative mgmt for now  * Acute COPD exacerbation. On steroids and scheduled nebulizers.  * Acute hypoxic respiratory failure - resolved now. Self extubated on 9/3, maintaining oxygenation on room air  * Cardiac Arrest etiology unknown- rhythm asystole - cardio seen and doesn't feel it's cardiac   Has been [facility]  Care management notified [place] [place] and [place] go there if they have bed available.  I have done EMTALA papers   All the  records are reviewed and case discussed with Care Management/Social Worker Management plans discussed with the patient, NURSING, Rosey Batheresa on phone and they are in agreement.  CODE STATUS: FULL CODE  DVT Prophylaxis: SCDs  TOTAL TIME TAKING CARE OF THIS PATIENT: 25 minutes.   [facility]  Ailea Rhatigan M.D on 08/21/2017 at 12:30 PM  Between 7am to 6pm - Pager - [phone]  After 6pm go to [email or web address] - password [email or web address] [facility]  [facility] [place] [facility]  Office  [phone]  CC: Primary care physician; Keane PoliceSlade-Hartman, Venezela, MD  Note: This dictation was prepared with Dragon dictation along with smaller phrase technology. Any transcriptional errors that result from this process are unintentional.

## 2017-08-21 NOTE — Progress Notes (Signed)
Subjective: Patient awake and alert.  Slow to answer questions but answers are appropriate.    Objective: Current vital signs: BP (!) 158/61 (BP Location: Right Arm)   Pulse 69   Temp 97.6 F (36.4 C) (Oral)   Resp 16   Ht 5\' 9"  (1.753 m)   Wt 75.8 kg (167 lb 1.6 oz)   SpO2 98%   BMI 24.68 kg/m  Vital signs in last 24 hours: Temp:  [97.6 F (36.4 C)-98.6 F (37 C)] 97.6 F (36.4 C) (09/06 0800) Pulse Rate:  [55-80] 69 (09/06 0800) Resp:  [15-20] 16 (09/06 0800) BP: (153-175)/(51-62) 158/61 (09/06 0800) SpO2:  [95 %-100 %] 98 % (09/06 0800) Weight:  [75.8 kg (167 lb 1.6 oz)] 75.8 kg (167 lb 1.6 oz) (09/06 0438)  Intake/Output from previous day: No intake/output data recorded. Intake/Output this shift: No intake/output data recorded. Nutritional status: Diet 2 gram sodium Room service appropriate? Yes; Fluid consistency: Thin  Neurologic Exam: Mental Status: Alert, oriented, thought content appropriate. Speech fluent without evidence of aphasia but patient slow to answer. Able to follow 3 step commands without difficulty. Cranial Nerves: II: Discs flat bilaterally; Visual fields grossly normal, pupils equal, round, reactive to light and accommodation III,IV, VI: ptosis not present, extra-ocular motions intact bilaterally V,VII: smile symmetric, facial light touch sensation normal bilaterally VIII: hearing normal bilaterally IX,X: gag reflex present XI: bilateral shoulder shrug XII: midline tongue extension Motor: Right :Upper extremity 5/5Left: Upper extremity 5/5 Lower extremity AKALower extremity 5/5    Lab Results: Basic Metabolic Panel:  Recent Labs Lab 08/15/17 1249 08/15/17 1819 08/16/17 0832 08/17/17 0509 08/17/17 1625 08/18/17 0424 08/18/17 0504 08/18/17 1736 08/19/17 0500  NA 137  --  136 137  --   --  138  --  142  K 5.2* 5.4* 4.7  4.7  --   --  4.5  --  3.9  CL 109  --  107 108  --   --  109  --  111  CO2 20*  --  23 23  --   --  24  --  23  GLUCOSE 190*  --  108* 142*  --   --  166*  --  154*  BUN 20  --  26* 31*  --   --  28*  --  29*  CREATININE 1.06  --  1.12 1.02  --   --  0.94  --  1.01  CALCIUM 8.6*  --  7.9* 7.8*  --   --  8.1*  --  9.2  MG 1.8  --   --   --  2.1 2.2  --  2.5*  --   PHOS 3.8  --   --   --  3.6 3.6  --  2.3*  --     Liver Function Tests:  Recent Labs Lab 08/15/17 0536  AST 19  ALT 5*  ALKPHOS 57  BILITOT 0.2*  PROT 6.8  ALBUMIN 3.1*   No results for input(s): LIPASE, AMYLASE in the last 168 hours. No results for input(s): AMMONIA in the last 168 hours.  CBC:  Recent Labs Lab 08/15/17 0536 08/15/17 1249  08/16/17 0832 08/16/17 1220 08/16/17 1729 08/17/17 0509 08/18/17 0504 08/19/17 0500  WBC 9.9 15.0*  --  12.6*  --   --  12.8* 12.5* 14.4*  NEUTROABS 7.1* 13.8*  --   --   --   --   --   --  12.5*  HGB 4.6* 5.4*  < >  7.6* 8.1* 7.9* 8.0* 8.3* 8.9*  HCT 15.6* 17.5*  < > 23.3* 24.8* 24.3* 25.2* 25.8* 28.0*  MCV 76.9* 77.1*  --  80.4  --   --  81.8 81.8 80.5  PLT 590* 553*  --  467*  --   --  441* 433 440  < > = values in this interval not displayed.  Cardiac Enzymes:  Recent Labs Lab 08/15/17 1249 08/15/17 1819 08/16/17 0244 08/17/17 1038  TROPONINI <0.03 0.08* 0.32* 0.17*    Lipid Panel:  Recent Labs Lab 08/15/17 1929 08/18/17 1910  TRIG 65 92    CBG:  Recent Labs Lab 08/20/17 1647 08/20/17 2023 08/21/17 0010 08/21/17 0439 08/21/17 0757  GLUCAP 238* 129* 74 97 105*    Microbiology: Results for orders placed or performed during the hospital encounter of 08/15/17  Culture, blood (Routine x 2)     Status: None   Collection Time: 08/15/17  5:36 AM  Result Value Ref Range Status   Specimen Description BLOOD LT [facility]  Final   Special Requests NONE  Final   Culture NO GROWTH 5 DAYS  Final   Report Status 08/20/2017 FINAL  Final  Culture, blood  (Routine x 2)     Status: None   Collection Time: 08/15/17  5:42 AM  Result Value Ref Range Status   Specimen Description BLOOD RT HAND  Final   Special Requests   Final    BOTTLES DRAWN AEROBIC AND ANAEROBIC Blood Culture adequate volume   Culture NO GROWTH 5 DAYS  Final   Report Status 08/20/2017 FINAL  Final  MRSA PCR Screening     Status: Abnormal   Collection Time: 08/15/17  8:48 PM  Result Value Ref Range Status   MRSA by PCR POSITIVE (A) NEGATIVE Final    Comment:        The GeneXpert MRSA Assay (FDA approved for NASAL specimens only), is one component of a comprehensive MRSA colonization surveillance program. It is not intended to diagnose MRSA infection nor to guide or monitor treatment for MRSA infections. RESULT CALLED TO, READ BACK BY AND VERIFIED WITH: ERICA TAYLOR AT 2206 08/15/17.PMH   Urine Culture     Status: None   Collection Time: 08/16/17  2:44 AM  Result Value Ref Range Status   Specimen Description URINE, RANDOM  Final   Special Requests NONE  Final   Culture   Final    NO GROWTH Performed at [facility], [address] [place], [place] [address]    Report Status 08/17/2017 FINAL  Final    Coagulation Studies: No results for input(s): LABPROT, INR in the last 72 hours.  Imaging: Mr Brain Wo Contrast  Result Date: 08/20/2017 CLINICAL DATA:  Acute presentation with episode of unresponsiveness. EXAM: MRI HEAD WITHOUT CONTRAST TECHNIQUE: Multiplanar, multiecho pulse sequences of the brain and surrounding structures were obtained without intravenous contrast. COMPARISON:  CT 09/07/2010 FINDINGS: Brain: Diffusion imaging does not show any acute or subacute infarction. There are chronic small-vessel ischemic changes of the pons. No cerebellar abnormality. 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 There are mild chronic small-vessel ischemic changes of the hemispheric white matter. No large vessel territory infarction. No  mass lesion, hemorrhage, hydrocephalus or extra-axial collection. Mesial temporal lobes appear symmetric and within normal limits. Vascular: Major vessels at the base of the brain show flow. Skull and upper cervical spine: Negative Sinuses/Orbits: Left maxillary sinus opacification. Other sinuses clear. Orbits negative. Other: None IMPRESSION: No  acute intracranial finding. Chronic small-vessel ischemic changes affecting the pons, thalami and hemispheric deep white matter. Left maxillary sinusitis. Electronically Signed   By: Paulina Fusi M.D.   On: 08/20/2017 12:50    Medications:  I have reviewed the patient's current medications. Scheduled: . amLODipine  10 mg Oral Daily  . aspirin EC  81 mg Oral Daily  . atorvastatin  20 mg Oral q1800  . budesonide (PULMICORT) nebulizer solution  0.25 mg Nebulization BID  . Chlorhexidine Gluconate Cloth  6 each Topical Q0600  . clopidogrel  75 mg Oral Daily  . enoxaparin (LOVENOX) injection  40 mg Subcutaneous Q24H  . feeding supplement (GLUCERNA SHAKE)  237 mL Oral TID BM  . gabapentin  100 mg Oral TID  . hydrALAZINE  25 mg Oral Q8H  . insulin aspart  0-15 Units Subcutaneous Q4H  . insulin glargine  5 Units Subcutaneous QHS  . ipratropium-albuterol  3 mL Nebulization TID  . lacosamide  100 mg Oral BID  . methylPREDNISolone (SOLU-MEDROL) injection  20 mg Intravenous Daily  . multivitamin with minerals  1 tablet Oral Daily  . mupirocin ointment  1 application Nasal BID  . pantoprazole  40 mg Oral BID AC  . senna-docusate  2 tablet Oral BID  . sodium chloride flush  3 mL Intravenous Q12H    Assessment/Plan: Patient continues to do well.  Has had no further events.  On Vimpat.  MRI of the brain performed and shows no acute changes.    Recommendations: 1.  Continue Vimpat at current dose.     LOS: 6 days   Thana Farr, MD Neurology [phone] 08/21/2017  10:53 AM

## 2017-08-21 NOTE — Progress Notes (Signed)
Per Gavin Poundeborah [facility] care resident and can return when stable.   Baker Hughes IncorporatedBailey Jakara Blatter, LCSW [phone]

## 2017-08-21 NOTE — Progress Notes (Signed)
Medications administered by student RN 0700-1600 with supervision of Clinical Instructor Veron Senner MSN, RN-BC.  

## 2017-08-22 LAB — CBC
HEMATOCRIT: 27.8 % — AB (ref 40.0–52.0)
HEMOGLOBIN: 9.2 g/dL — AB (ref 13.0–18.0)
MCH: 26.2 pg (ref 26.0–34.0)
MCHC: 33 g/dL (ref 32.0–36.0)
MCV: 79.3 fL — AB (ref 80.0–100.0)
Platelets: 334 10*3/uL (ref 150–440)
RBC: 3.5 MIL/uL — AB (ref 4.40–5.90)
RDW: 20.6 % — AB (ref 11.5–14.5)
WBC: 14.1 10*3/uL — ABNORMAL HIGH (ref 3.8–10.6)

## 2017-08-22 LAB — GLUCOSE, CAPILLARY
GLUCOSE-CAPILLARY: 136 mg/dL — AB (ref 65–99)
Glucose-Capillary: 121 mg/dL — ABNORMAL HIGH (ref 65–99)
Glucose-Capillary: 186 mg/dL — ABNORMAL HIGH (ref 65–99)
Glucose-Capillary: 301 mg/dL — ABNORMAL HIGH (ref 65–99)

## 2017-08-22 MED ORDER — LACOSAMIDE 100 MG PO TABS
100.0000 mg | ORAL_TABLET | Freq: Two times a day (BID) | ORAL | 0 refills | Status: DC
Start: 2017-08-22 — End: 2020-05-25

## 2017-08-22 MED ORDER — PANTOPRAZOLE SODIUM 40 MG PO TBEC: 40.0000 mg | DELAYED_RELEASE_TABLET | Freq: Two times a day (BID) | ORAL | Status: DC

## 2017-08-22 MED ORDER — PREDNISONE 10 MG (21) PO TBPK: ORAL_TABLET | ORAL | 0 refills | Status: DC

## 2017-08-22 MED ORDER — HYDRALAZINE HCL 25 MG PO TABS: 25.0000 mg | ORAL_TABLET | Freq: Three times a day (TID) | ORAL | 0 refills | Status: DC

## 2017-08-22 NOTE — Discharge Instructions (Signed)
Gastrointestinal Bleeding °Gastrointestinal bleeding is bleeding somewhere along the path food travels through the body (digestive tract). This path is anywhere between the mouth and the opening of the butt (anus). You may have blood in your poop (stools) or have black poop. If you throw up (vomit), there may be blood in it. °This condition can be mild, serious, or even life-threatening. If you have a lot of bleeding, you may need to stay in the hospital. °Follow these instructions at home: °· Take over-the-counter and prescription medicines only as told by your doctor. °· Eat foods that have a lot of fiber in them. These foods include whole grains, fruits, and vegetables. You can also try eating 1-3 prunes each day. °· Drink enough fluid to keep your pee (urine) clear or pale yellow. °· Keep all follow-up visits as told by your doctor. This is important. °Contact a doctor if: °· Your symptoms do not get better. °Get help right away if: °· Your bleeding gets worse. °· You feel dizzy or you pass out (faint). °· You feel weak. °· You have very bad cramps in your back or belly (abdomen). °· You pass large clumps of blood (clots) in your poop. °· Your symptoms are getting worse. °This information is not intended to replace advice given to you by your health care provider. Make sure you discuss any questions you have with your health care provider. °Document Released: 09/10/2008 Document Revised: 05/09/2016 Document Reviewed: 05/22/2015 °Elsevier Interactive Patient Education © 2018 Elsevier Inc. ° °

## 2017-08-22 NOTE — Progress Notes (Signed)
[facility] Bo McclintockBrewer,Tiegan Jambor S, RN

## 2017-08-22 NOTE — Discharge Summary (Addendum)
[facility] [place] at [facility]   PATIENT NAME: Blake Villarreal    MR#:  [id number]  DATE OF BIRTH:  December 18, 1945  DATE OF ADMISSION:  08/15/2017   ADMITTING PHYSICIAN: Milagros Loll, MD  DATE OF DISCHARGE: 08/22/2017  PRIMARY CARE PHYSICIAN: Keane Police, MD   ADMISSION DIAGNOSIS:  Shortness of breath [R06.02] Lactic acidosis [E87.2] Acute blood loss anemia [D62] Healthcare-associated pneumonia [J18.9] Gastrointestinal hemorrhage associated with gastritis, unspecified gastritis type [K29.71] DISCHARGE DIAGNOSIS:  Active Problems:   Anemia   Acute respiratory failure (HCC)   Acute encephalopathy   Pressure injury of skin   Healthcare-associated pneumonia   DNR (do not resuscitate) discussion   Palliative care encounter  SECONDARY DIAGNOSIS:   Past Medical History:  Diagnosis Date  . Diabetes mellitus without complication (HCC)   . Hypertension   . Intermittent confusion   . PAD (peripheral artery disease) [facility])    HOSPITAL COURSE:  71 year old male admitted after an episode of unresponsiveness as his facility. Has had multiple since admission. Seem to start with SOB then cardiac arrhythmia. Unclear if this is a primary cardiac versus primary neurological event. Patient with high potassium, low hemoglobin and PNA  * Acute metabolic encephalopathy - multifactorial, seem to have resolved - EEG negative for seizures or epileptiform activity. - MRI brain negative, performed today - Continue Vimpat 100 mg p.o. twice daily per neurology. -Seizure precautions - unsure etio for his episode  * GI bleed with acute blood loss anemia - s/p 1 unit of blood transfusion - Hb 9.2 - outpt gi eval * Acute COPD exacerbation: resolved. On room air  * Acute hypoxic respiratory failure - resolved now. Self extubated on 9/3, maintaining oxygenation on room air  * Cardiac Arrest etiology unknown- - cardio seen and doesn't feel it's cardiac  etio  DISCHARGE CONDITIONS:  stable CONSULTS OBTAINED:  Treatment Team:  Kym Groom, MD Thana Farr, MD Alwyn Pea, MD DRUG ALLERGIES:   Allergies  Allergen Reactions  . Daptomycin   . Iodinated Diagnostic Agents    DISCHARGE MEDICATIONS:   Allergies as of 08/22/2017      Reactions   Daptomycin    Iodinated Diagnostic Agents       Medication List    STOP taking these medications   carvedilol 12.5 MG tablet Commonly known as:  COREG   doxycycline 100 MG EC tablet Commonly known as:  DORYX   doxycycline 100 MG tablet Commonly known as:  ADOXA   omeprazole 20 MG capsule Commonly known as:  PRILOSEC     TAKE these medications   acetaminophen 325 MG tablet Commonly known as:  TYLENOL Take 650 mg by mouth every 6 (six) hours as needed.   albuterol (2.5 MG/3ML) 0.083% nebulizer solution Commonly known as:  PROVENTIL Take 2 ampules by nebulization every 6 (six) hours as needed for wheezing.   amLODipine 10 MG tablet Commonly known as:  NORVASC Take 10 mg by mouth daily.   aspirin 81 MG chewable tablet Chew 81 mg by mouth daily.   atorvastatin 20 MG tablet Commonly known as:  LIPITOR Take 20 mg by mouth daily.   clopidogrel 75 MG tablet Commonly known as:  PLAVIX Take 75 mg by mouth daily.   docusate sodium 100 MG capsule Commonly known as:  COLACE Take 100 mg by mouth daily as needed.   gabapentin 100 MG capsule Commonly known as:  NEURONTIN Take 100 mg by mouth 3 (three) times daily.  hydrALAZINE 25 MG tablet Commonly known as:  APRESOLINE Take 1 tablet (25 mg total) by mouth 3 (three) times daily.   hydrocerin Crea Apply 1 application topically 2 (two) times daily.   insulin glargine 100 UNIT/ML injection Commonly known as:  LANTUS Inject 5 Units into the skin at bedtime.   insulin regular 100 units/mL injection Commonly known as:  NOVOLIN R,HUMULIN R Inject 4-14 Units into the skin 3 (three) times daily before meals.  0-149 = 0 units, 150-200 = 4 units, 201-250 = 7 units, 251-300 = 9 units, 301-350 = 12 units, 351-400 = 14 units, call MD > 400.   Lacosamide 100 MG Tabs Take 1 tablet (100 mg total) by mouth 2 (two) times daily.   lisinopril 40 MG tablet Commonly known as:  PRINIVIL,ZESTRIL Take 40 mg by mouth daily.   Magnesium 400 MG Tabs Take 400 mg by mouth daily.   magnesium oxide 400 MG tablet Commonly known as:  MAG-OX Take 400 mg by mouth daily.   metFORMIN 500 MG tablet Commonly known as:  GLUCOPHAGE Take 500 mg by mouth 2 (two) times daily with a meal.   mirtazapine 7.5 MG tablet Commonly known as:  REMERON Take 7.5 mg by mouth at bedtime.   pantoprazole 40 MG tablet Commonly known as:  PROTONIX Take 1 tablet (40 mg total) by mouth 2 (two) times daily before a meal.   predniSONE 10 MG (21) Tbpk tablet Commonly known as:  STERAPRED UNI-PAK 21 TAB Start 60 mg po daily, taper 10 mg daily until done   terazosin 5 MG capsule Commonly known as:  HYTRIN Take 5 mg by mouth at bedtime.   vitamin B-12 1000 MCG tablet Commonly known as:  CYANOCOBALAMIN Take 1,000 mcg by mouth daily.   Vitamin D3 2000 units Tabs Take 1 tablet by mouth daily.            Discharge Care Instructions        Start     Ordered   08/22/17 0000  hydrALAZINE (APRESOLINE) 25 MG tablet  3 times daily     08/22/17 0826   08/22/17 0000  lacosamide 100 MG TABS  2 times daily     08/22/17 0826   08/22/17 0000  pantoprazole (PROTONIX) 40 MG tablet  2 times daily before meals     08/22/17 0826   08/22/17 0000  predniSONE (STERAPRED UNI-PAK 21 TAB) 10 MG (21) TBPK tablet     08/22/17 0826   08/22/17 0000  Increase activity slowly     08/22/17 0826   08/22/17 0000  Diet - low sodium heart healthy     08/22/17 0826       DISCHARGE INSTRUCTIONS:   DIET:  Regular diet DISCHARGE CONDITION:  Good ACTIVITY:  Activity as tolerated OXYGEN:  Home Oxygen: No.  Oxygen Delivery: room air DISCHARGE  LOCATION:  nursing home - Palliative care to follow  If you experience worsening of your admission symptoms, develop shortness of breath, life threatening emergency, suicidal or homicidal thoughts you must seek medical attention immediately by calling 911 or calling your MD immediately  if symptoms less severe.  You Must read complete instructions/literature along with all the possible adverse reactions/side effects for all the Medicines you take and that have been prescribed to you. Take any new Medicines after you have completely understood and accpet all the possible adverse reactions/side effects.   Please note  You were cared for by a hospitalist during your hospital stay. If  you have any questions about your discharge medications or the care you received while you were in the hospital after you are discharged, you can call the unit and asked to speak with the hospitalist on call if the hospitalist that took care of you is not available. Once you are discharged, your primary care physician will handle any further medical issues. Please note that NO REFILLS for any discharge medications will be authorized once you are discharged, as it is imperative that you return to your primary care physician (or establish a relationship with a primary care physician if you do not have one) for your aftercare needs so that they can reassess your need for medications and monitor your lab values.    On the day of Discharge:  VITAL SIGNS:  Blood pressure (!) 163/65, pulse 60, temperature 97.8 F (36.6 C), temperature source Oral, resp. rate 16, height  (1.753 m), weight 75.1 kg (165 lb 9.6 oz), SpO2 99 %. PHYSICAL EXAMINATION:  GENERAL:  71 y.o.-year-old patient lying in the bed with no acute distress.  EYES: Pupils equal, round, reactive to light and accommodation. No scleral icterus. Extraocular muscles intact.  HEENT: Head atraumatic, normocephalic. Oropharynx and nasopharynx clear.  NECK:  Supple,  no jugular venous distention. No thyroid enlargement, no tenderness.  LUNGS: Normal breath sounds bilaterally, no wheezing, rales,rhonchi or crepitation. No use of accessory muscles of respiration.  CARDIOVASCULAR: S1, S2 normal. No murmurs, rubs, or gallops.  ABDOMEN: Soft, non-tender, non-distended. Bowel sounds present. No organomegaly or mass.  EXTREMITIES: No pedal edema, cyanosis, or clubbing.  NEUROLOGIC: Cranial nerves II through XII are intact. Muscle strength 5/5 in all extremities. Sensation intact. Gait not checked.  PSYCHIATRIC: The patient is alert and oriented x 3.  SKIN: No obvious rash, lesion, or ulcer.  DATA REVIEW:   CBC  Recent Labs Lab 08/22/17 0519  WBC 14.1*  HGB 9.2*  HCT 27.8*  PLT 334    Chemistries   Recent Labs Lab 08/18/17 1736 08/19/17 0500  NA  --  142  K  --  3.9  CL  --  111  CO2  --  23  GLUCOSE  --  154*  BUN  --  29*  CREATININE  --  1.01  CALCIUM  --  9.2  MG 2.5*  --      Microbiology Results  Results for orders placed or performed during the hospital encounter of 08/15/17  Culture, blood (Routine x 2)     Status: None   Collection Time: 08/15/17  5:36 AM  Result Value Ref Range Status   Specimen Description BLOOD LT [facility]  Final   Special Requests NONE  Final   Culture NO GROWTH 5 DAYS  Final   Report Status 08/20/2017 FINAL  Final  Culture, blood (Routine x 2)     Status: None   Collection Time: 08/15/17  5:42 AM  Result Value Ref Range Status   Specimen Description BLOOD RT HAND  Final   Special Requests   Final    BOTTLES DRAWN AEROBIC AND ANAEROBIC Blood Culture adequate volume   Culture NO GROWTH 5 DAYS  Final   Report Status 08/20/2017 FINAL  Final  MRSA PCR Screening     Status: Abnormal   Collection Time: 08/15/17  8:48 PM  Result Value Ref Range Status   MRSA by PCR POSITIVE (A) NEGATIVE Final    Comment:        The GeneXpert MRSA Assay (FDA approved for NASAL  specimens only), is one component of  a comprehensive MRSA colonization surveillance program. It is not intended to diagnose MRSA infection nor to guide or monitor treatment for MRSA infections. RESULT CALLED TO, READ BACK BY AND VERIFIED WITH: ERICA TAYLOR AT 2206 08/15/17.PMH   Urine Culture     Status: None   Collection Time: 08/16/17  2:44 AM  Result Value Ref Range Status   Specimen Description URINE, RANDOM  Final   Special Requests NONE  Final   Culture   Final    NO GROWTH Performed at [facility], [address], [place], [place] [address]    Report Status 08/17/2017 FINAL  Final     Contact information for follow-up providers    Keane Police, MD. Schedule an appointment as soon as possible for a visit in 1 week(s).   Specialty:  Family Medicine Contact information: [address] [place] [address] [phone]        Midge Minium, MD. Schedule an appointment as soon as possible for a visit in 2 week(s).   Specialty:  Gastroenterology Contact information: [address] [place]  [place] [address] [phone]        Alwyn Pea, MD. Schedule an appointment as soon as possible for a visit in 3 week(s).   Specialties:  Cardiology, Internal Medicine Contact information: [address] [facility] - CARDIOLOGY [place] [address] [phone]        Lonell Face, MD. Schedule an appointment as soon as possible for a visit in 3 week(s).   Specialty:  Neurology Contact information: [phone] [facility] [address] [facility] [place] [address] [phone]            Contact information for after-discharge care    Destination    [facility] Follow up.   Specialty:  Skilled Nursing Facility Contact information: [address] [place] [address] [phone]                  Management plans discussed with the patient, family and they are in agreement.  CODE STATUS: Full  Code   TOTAL TIME TAKING CARE OF THIS PATIENT: 45 minutes.    Delfino Lovett M.D on 08/22/2017 at 10:19 AM  Between 7am to 6pm - Pager - [phone]  After 6pm go to [email or web address] - Social research officer, government  [facility] [place] [facility]  Office  [phone]  CC: Primary care physician; Keane Police, MD   Note: This dictation was prepared with Dragon dictation along with smaller phrase technology. Any transcriptional errors that result from this process are unintentional.

## 2017-08-22 NOTE — Progress Notes (Signed)
CH responded to a PG requesting prayer and AD education. RN stated that the daughter had requested the visit, but has left for work. RN stated that we should cancel the PG for now. CH is available for follow up as needed.    08/22/17 1000  Clinical Encounter Type  Visited With Health care provider  Visit Type Initial;Spiritual support  Referral From Nurse  Consult/Referral To Chaplain  Spiritual Encounters  Spiritual Needs Literature;Prayer

## 2017-08-22 NOTE — Progress Notes (Signed)
[facility] EMS called for transport. Bo McclintockBrewer,Langston [place] S, RN

## 2017-08-22 NOTE — Care Management (Signed)
Discharge summary faxed to Kendrick FriesLaurie Vesey at [facility] Gwenette GreetBrenda S Mardy Lucier RN MSN CCM Care Management [phone]

## 2017-08-22 NOTE — Progress Notes (Addendum)
Patient is medically stable for [facility]. Per Gavin Poundeborah [facility] to room 313-A. RN will call report to C-wing nurse and arrange EMS for transport. Clinical Child psychotherapistocial Worker (CSW) [facility] today. Patient is aware of above. CSW attempted to contact patient's cousin Rosey Batheresa however she did not answer and voicemail was left. CSW contacted patient's brother Maurine MinisterDennis and made him aware of above. Please reconsult if future social work needs arise. CSW signing off.   Patient's cousin Rosey Batheresa called CSW and is in agreement with D/C plan.   [employer]orporatedBailey Lewin Pellow, LCSW [phone]

## 2017-08-26 NOTE — Progress Notes (Signed)
Subjective:  Intubated and sedated.  Objective:  Vital Signs in the last 24 hours:    Intake/Output from previous day: No intake/output data recorded. Intake/Output from this shift: No intake/output data recorded.  Physical Exam: General appearance: appears older than stated age Neck: no adenopathy, no carotid bruit, no JVD, supple, symmetrical, trachea midline and thyroid not enlarged, symmetric, no tenderness/mass/nodules Lungs: diminished breath sounds bibasilar and bilaterally Heart: irregularly irregular rhythm Abdomen: soft, non-tender; bowel sounds normal; no masses,  no organomegaly Extremities: AKA unilateral Pulses: 2+ and symmetric pulses one side Skin: Skin color, texture, turgor normal. No rashes or lesions  Lab Results: No results for input(s): WBC, HGB, PLT in the last 72 hours. No results for input(s): NA, K, CL, CO2, GLUCOSE, BUN, CREATININE in the last 72 hours. No results for input(s): TROPONINI in the last 72 hours.  Invalid input(s): CK, MB Hepatic Function Panel No results for input(s): PROT, ALBUMIN, AST, ALT, ALKPHOS, BILITOT, BILIDIR, IBILI in the last 72 hours. No results for input(s): CHOL in the last 72 hours. No results for input(s): PROTIME in the last 72 hours.  Imaging: Imaging results have been reviewed  Cardiac Studies:  Assessment/Plan:  respiratory failure  COPD Smoking Peripheral vascular disease Bradycardia Coronary artery disease Hyperlipidemia Alcohol abuse Diabetes Possible seizure activity Profound anemia.  Plan Continue ventilatory support Recommend inhalers Agree with critical care management Agree with GI consult possible scope for anemia Episode of bradycardia probably secondary to recently failure Short-term anticoagulation for A. Fib Do not recommend cardiac cath or invasive procedure  LOS: 7 days    Dwayne D Callwood 08/26/2017, 1:51 PM

## 2017-08-28 ENCOUNTER — Other Ambulatory Visit
Admission: RE | Admit: 2017-08-28 | Discharge: 2017-08-28 | Disposition: A | Discharge status: Home / Self Care | Payer: Medicare Other | Source: Ambulatory Visit | Attending: Family Medicine | Admitting: Family Medicine

## 2017-08-28 DIAGNOSIS — D649 Anemia, unspecified: Secondary | ICD-10-CM | POA: Insufficient documentation

## 2017-08-28 LAB — CBC WITH DIFFERENTIAL/PLATELET
BASOS ABS: 0.1 10*3/uL (ref 0–0.1)
BASOS PCT: 0 %
Eosinophils Absolute: 0.2 10*3/uL (ref 0–0.7)
Eosinophils Relative: 1 %
HEMATOCRIT: 29.2 % — AB (ref 40.0–52.0)
HEMOGLOBIN: 9.3 g/dL — AB (ref 13.0–18.0)
LYMPHS PCT: 4 %
Lymphs Abs: 0.8 10*3/uL — ABNORMAL LOW (ref 1.0–3.6)
MCH: 25.8 pg — ABNORMAL LOW (ref 26.0–34.0)
MCHC: 31.9 g/dL — ABNORMAL LOW (ref 32.0–36.0)
MCV: 81 fL (ref 80.0–100.0)
MONO ABS: 1.6 10*3/uL — AB (ref 0.2–1.0)
Monocytes Relative: 9 %
NEUTROS ABS: 15.4 10*3/uL — AB (ref 1.4–6.5)
NEUTROS PCT: 86 %
Platelets: 407 10*3/uL (ref 150–440)
RBC: 3.6 MIL/uL — AB (ref 4.40–5.90)
RDW: 21.3 % — ABNORMAL HIGH (ref 11.5–14.5)
WBC: 18 10*3/uL — AB (ref 3.8–10.6)

## 2018-04-11 IMAGING — US US EXTREM LOW VENOUS*L*
1 series · 13 of 24 positions shown · non-contrast
Comparison: None.

CLINICAL DATA: Initial evaluation for edema of left lower
extremity.



[Series 1: us extrem low venous*left* · 0.08mm/px · 13 of 41 slices shown]
[im 1/41]
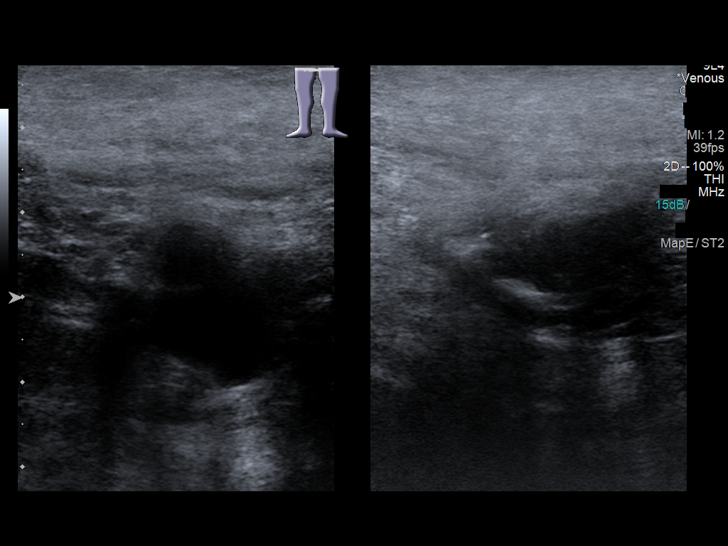
[im 4/41]
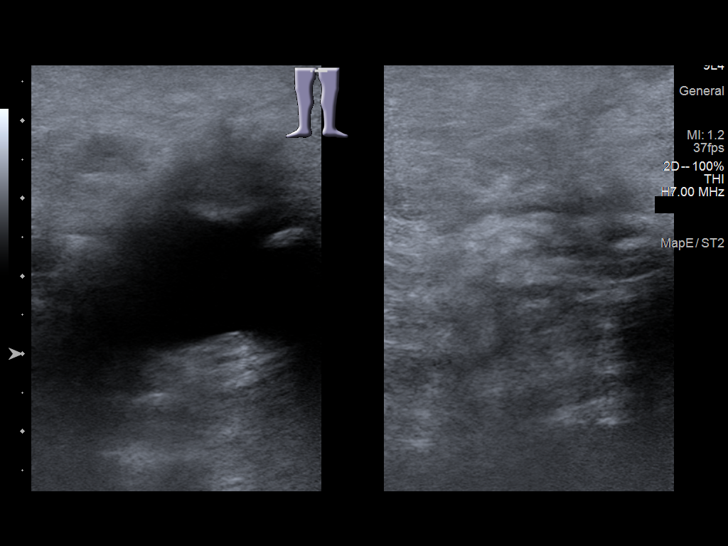
[im 7/41]
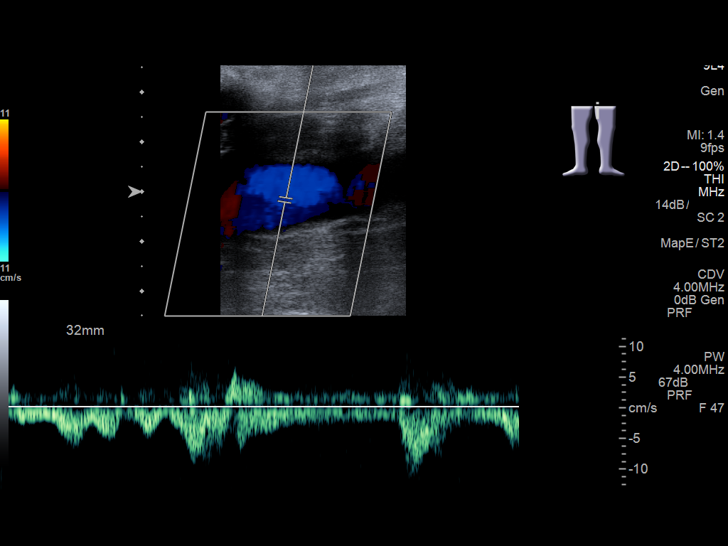
[im 11/41]
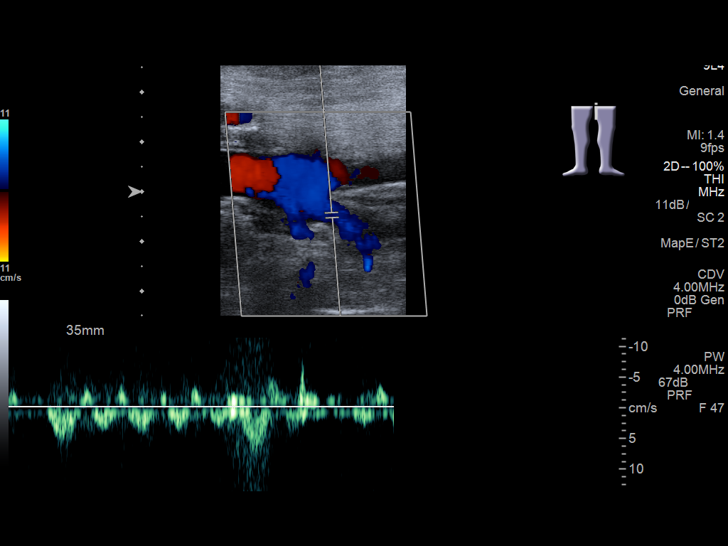
[im 14/41]
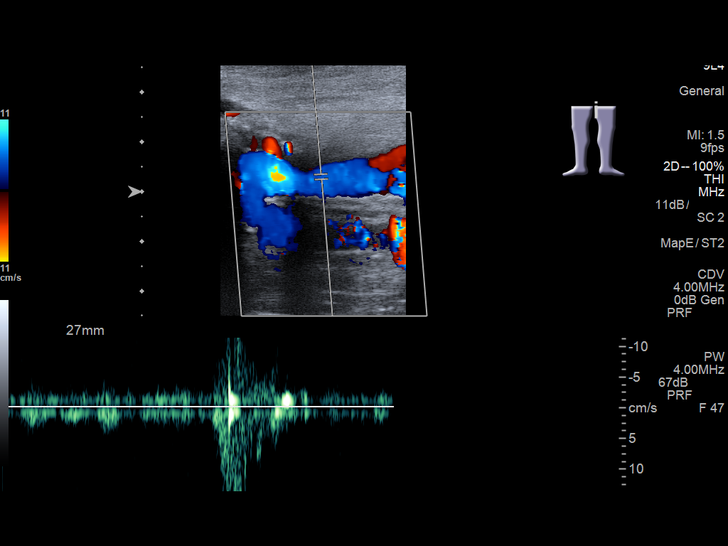
[im 18/41]
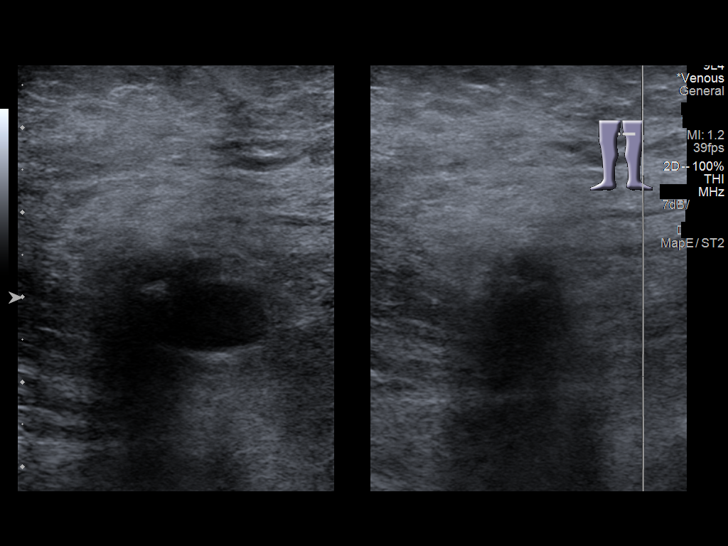
[im 21/41]
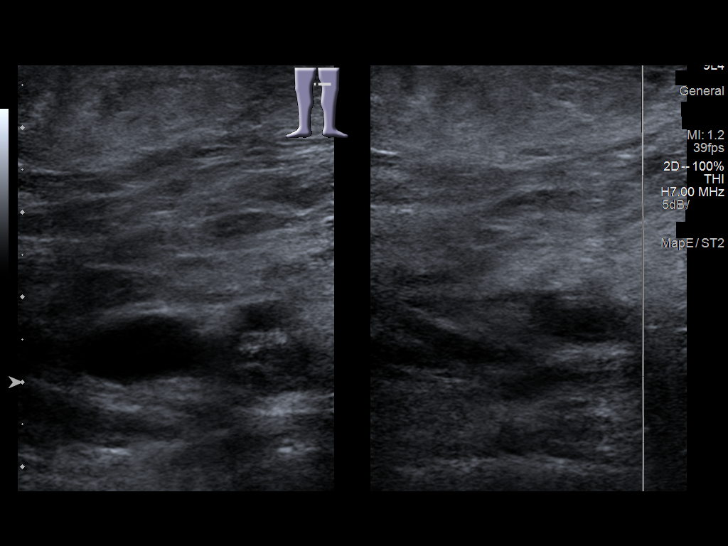
[im 23/41]
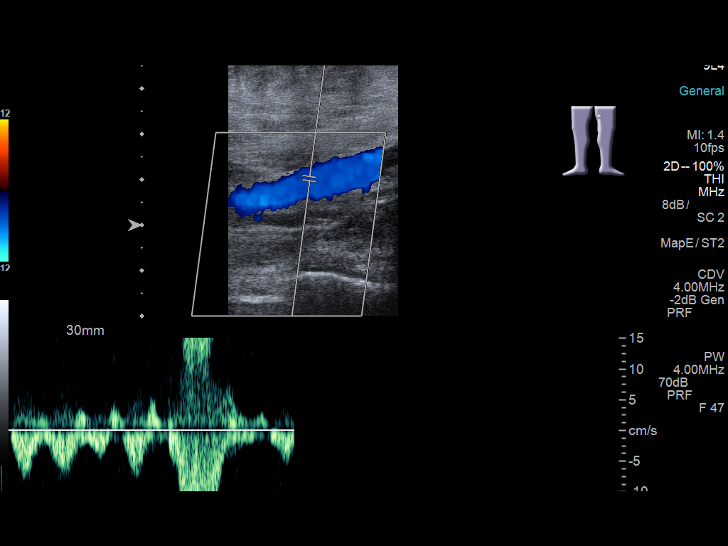
[im 27/41]
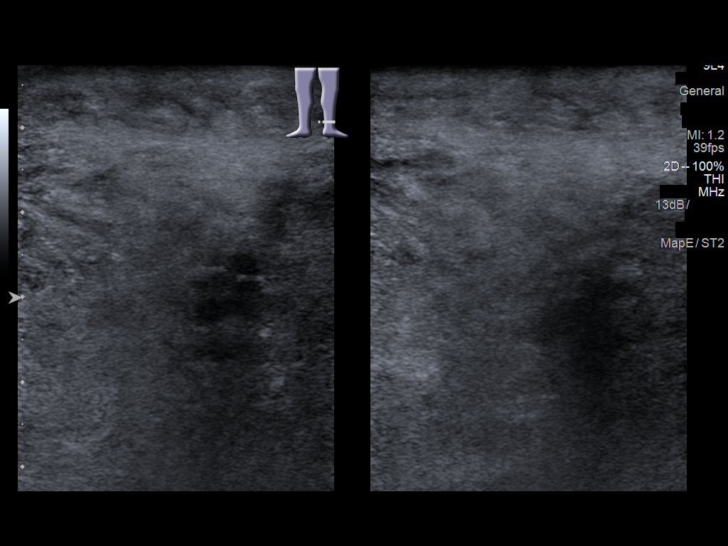
[im 30/41]
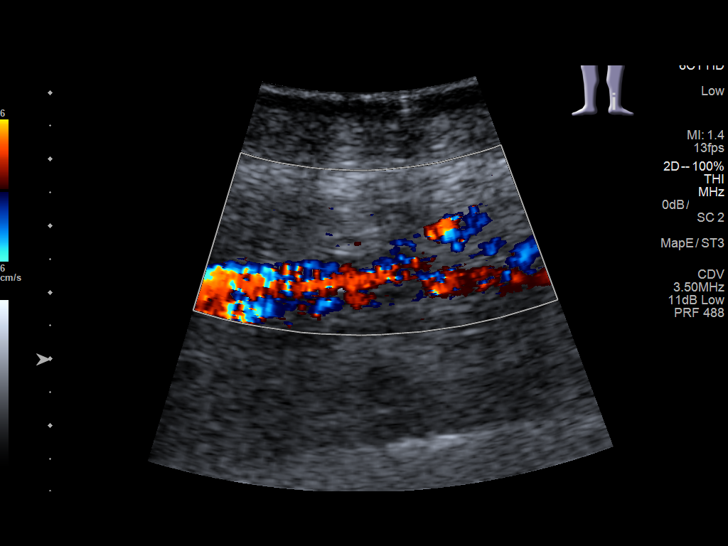
[im 34/41]
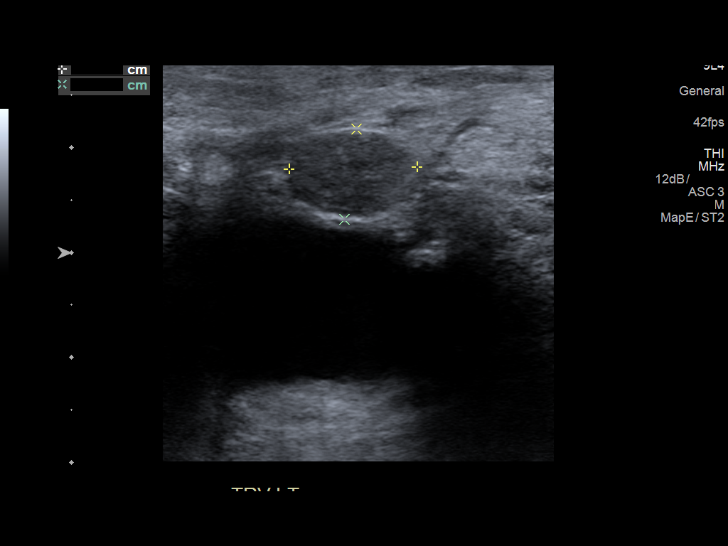
[im 37/41]
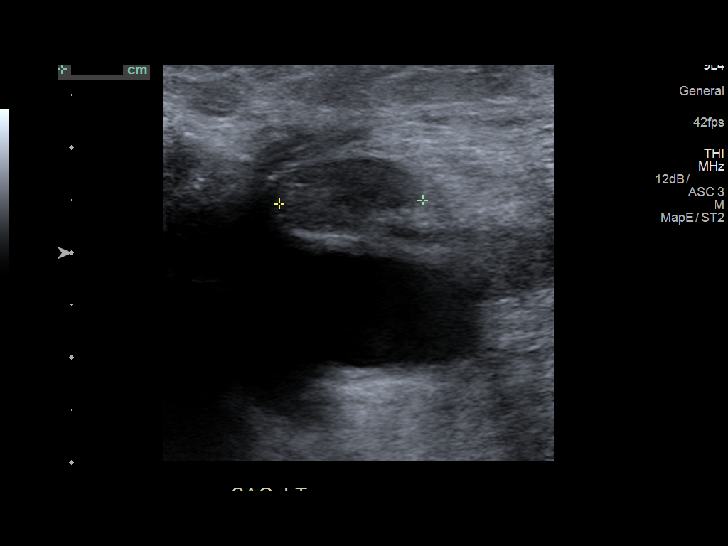
[im 41/41]
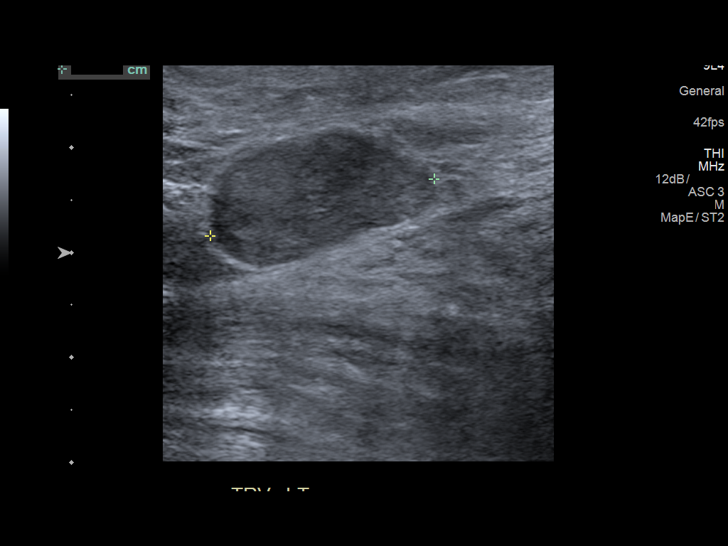

[13 of 24 positions shown; findings below may reference images not displayed]

FINDINGS: Contralateral Common Femoral Vein: Respiratory phasicity is normal
and symmetric with the symptomatic side. No evidence of thrombus.
Normal compressibility.

Common Femoral Vein: No evidence of thrombus. Normal
compressibility, respiratory phasicity and response to augmentation.

Saphenofemoral Junction: No evidence of thrombus. Normal
compressibility and flow on color Doppler imaging.

Profunda Femoral Vein: No evidence of thrombus. Normal
compressibility and flow on color Doppler imaging.

Femoral Vein: No evidence of thrombus. Normal compressibility,
respiratory phasicity and response to augmentation.

Popliteal Vein: No evidence of thrombus. Normal compressibility,
respiratory phasicity and response to augmentation.

Calf Veins: No evidence of thrombus. Normal compressibility and flow
on color Doppler imaging.

Superficial Great Saphenous Vein: Not visualized. Reportedly,
patient status post surgery with possible graft prior gestation.

Venous Reflux:  None.

Other Findings: Few mildly prominent lymph nodes noted at the left
groin measuring up to 2.2 cm in long axis.
IMPRESSION: No evidence of DVT within the left lower extremity.

## 2018-05-03 ENCOUNTER — Other Ambulatory Visit: Payer: Self-pay

## 2018-05-03 ENCOUNTER — Emergency Department: Payer: Medicare Other

## 2018-05-03 ENCOUNTER — Emergency Department
Admission: EM | Admit: 2018-05-03 | Discharge: 2018-05-03 | Disposition: A | Discharge status: Skilled Nursing Facility | Payer: Medicare Other | Attending: Emergency Medicine | Admitting: Emergency Medicine

## 2018-05-03 DIAGNOSIS — Z7902 Long term (current) use of antithrombotics/antiplatelets: Secondary | ICD-10-CM | POA: Insufficient documentation

## 2018-05-03 DIAGNOSIS — Z7982 Long term (current) use of aspirin: Secondary | ICD-10-CM | POA: Insufficient documentation

## 2018-05-03 DIAGNOSIS — N289 Disorder of kidney and ureter, unspecified: Secondary | ICD-10-CM | POA: Diagnosis not present

## 2018-05-03 DIAGNOSIS — E860 Dehydration: Secondary | ICD-10-CM

## 2018-05-03 DIAGNOSIS — I10 Essential (primary) hypertension: Secondary | ICD-10-CM | POA: Diagnosis not present

## 2018-05-03 DIAGNOSIS — R202 Paresthesia of skin: Secondary | ICD-10-CM | POA: Diagnosis not present

## 2018-05-03 DIAGNOSIS — F17200 Nicotine dependence, unspecified, uncomplicated: Secondary | ICD-10-CM | POA: Insufficient documentation

## 2018-05-03 DIAGNOSIS — Z794 Long term (current) use of insulin: Secondary | ICD-10-CM | POA: Diagnosis not present

## 2018-05-03 DIAGNOSIS — Z79899 Other long term (current) drug therapy: Secondary | ICD-10-CM | POA: Insufficient documentation

## 2018-05-03 DIAGNOSIS — R42 Dizziness and giddiness: Secondary | ICD-10-CM | POA: Diagnosis present

## 2018-05-03 DIAGNOSIS — E119 Type 2 diabetes mellitus without complications: Secondary | ICD-10-CM | POA: Diagnosis not present

## 2018-05-03 LAB — URINALYSIS, COMPLETE (UACMP) WITH MICROSCOPIC
BACTERIA UA: NONE SEEN
BILIRUBIN URINE: NEGATIVE
Glucose, UA: NEGATIVE mg/dL
Hgb urine dipstick: NEGATIVE
Ketones, ur: NEGATIVE mg/dL
Leukocytes, UA: NEGATIVE
NITRITE: NEGATIVE
PH: 5 (ref 5.0–8.0)
Protein, ur: NEGATIVE mg/dL
SPECIFIC GRAVITY, URINE: 1.016 (ref 1.005–1.030)

## 2018-05-03 LAB — COMPREHENSIVE METABOLIC PANEL
ALT: 9 U/L — ABNORMAL LOW (ref 17–63)
ANION GAP: 7 (ref 5–15)
AST: 15 U/L (ref 15–41)
Albumin: 3.9 g/dL (ref 3.5–5.0)
Alkaline Phosphatase: 82 U/L (ref 38–126)
BILIRUBIN TOTAL: 0.5 mg/dL (ref 0.3–1.2)
BUN: 27 mg/dL — AB (ref 6–20)
CHLORIDE: 107 mmol/L (ref 101–111)
CO2: 23 mmol/L (ref 22–32)
Calcium: 9.4 mg/dL (ref 8.9–10.3)
Creatinine, Ser: 1.3 mg/dL — ABNORMAL HIGH (ref 0.61–1.24)
GFR calc Af Amer: >60 mL/min (ref >60)
GFR calc non Af Amer: 53 mL/min — ABNORMAL LOW (ref >60)
GLUCOSE: 148 mg/dL — AB (ref 65–99)
POTASSIUM: 4.6 mmol/L (ref 3.5–5.1)
SODIUM: 137 mmol/L (ref 135–145)
TOTAL PROTEIN: 7.9 g/dL (ref 6.5–8.1)

## 2018-05-03 LAB — CBC
HEMATOCRIT: 30.4 % — AB (ref 40.0–52.0)
HEMOGLOBIN: 9.9 g/dL — AB (ref 13.0–18.0)
MCH: 27.7 pg (ref 26.0–34.0)
MCHC: 32.6 g/dL (ref 32.0–36.0)
MCV: 85 fL (ref 80.0–100.0)
Platelets: 382 10*3/uL (ref 150–440)
RBC: 3.58 MIL/uL — ABNORMAL LOW (ref 4.40–5.90)
RDW: 16.7 % — AB (ref 11.5–14.5)
WBC: 10.8 10*3/uL — ABNORMAL HIGH (ref 3.8–10.6)

## 2018-05-03 LAB — TROPONIN I
Troponin I: <0.03 ng/mL (ref <0.03)
Troponin I: <0.03 ng/mL (ref <0.03)

## 2018-05-03 MED ORDER — SODIUM CHLORIDE 0.9 % IV BOLUS: 500.0000 mL | Freq: Once | INTRAVENOUS | Status: DC

## 2018-05-03 MED ORDER — SODIUM CHLORIDE 0.9 % IV BOLUS
1000.0000 mL | Freq: Once | INTRAVENOUS | Status: AC
  Administered 2018-05-03: 1000 mL via INTRAVENOUS

## 2018-05-03 NOTE — ED Notes (Signed)
This RN to bedside at this time, introduced self to patient and family at bedside. Medication administered per MD order. Pt is alert and oriented at this time. Will continue to monitor for further patient needs.

## 2018-05-03 NOTE — ED Provider Notes (Signed)
[facility] Emergency Department Provider Note  ___________________________________________   First MD Initiated Contact with Patient 05/03/18 1051     (approximate)  I have reviewed the triage vital signs and the nursing notes.   HISTORY  Chief Complaint Dizziness and Tingling   HPI Blake Villarreal is a 72 y.o. male a history of diabetes, intermittent confusion and peripheral arterial disease was brought into the emergency department today with tingling to his bilateral fingers as well as lightheadedness that started this morning at about 9 AM.  He says that he feels like he is "out of my body."  He denies any pain.  Denies any nausea or vomiting.  Says that he has been eating and drinking normally.  Denies any diarrhea.  Says that he has urinary incontinence and wears an adult diaper at his baseline.  Denies any focal weakness.  Says the symptoms have been steadily improving ever since 9 AM.  Past Medical History:  Diagnosis Date  . Diabetes mellitus without complication (HCC)   . Hypertension   . Intermittent confusion   . PAD (peripheral artery disease) [facility])     Patient Active Problem List   Diagnosis Date Noted  . Healthcare-associated pneumonia   . DNR (do not resuscitate) discussion   . Palliative care encounter   . Pressure injury of skin 08/16/2017  . Acute encephalopathy   . Anemia 08/15/2017  . Acute respiratory failure [facility])     Past Surgical History:  Procedure Laterality Date  . ABOVE KNEE LEG AMPUTATION      Prior to Admission medications   Medication Sig Start Date End Date Taking? Authorizing Provider  acetaminophen (TYLENOL) 325 MG tablet Take 650 mg by mouth every 6 (six) hours as needed.    [provider]  albuterol (PROVENTIL) (2.5 MG/3ML) 0.083% nebulizer solution Take 2 ampules by nebulization every 6 (six) hours as needed for wheezing.    [provider]  amLODipine (NORVASC) 10 MG tablet Take 10 mg  by mouth daily.    [provider]  aspirin 81 MG chewable tablet Chew 81 mg by mouth daily.    [provider]  atorvastatin (LIPITOR) 20 MG tablet Take 20 mg by mouth daily.    [provider]  Cholecalciferol (VITAMIN D3) 2000 units TABS Take 1 tablet by mouth daily.    [provider]  clopidogrel (PLAVIX) 75 MG tablet Take 75 mg by mouth daily.    [provider]  docusate sodium (COLACE) 100 MG capsule Take 100 mg by mouth daily as needed.     [provider]  gabapentin (NEURONTIN) 100 MG capsule Take 100 mg by mouth 3 (three) times daily.    [provider]  hydrALAZINE (APRESOLINE) 25 MG tablet Take 1 tablet (25 mg total) by mouth 3 (three) times daily. 08/22/17   Delfino Lovett, MD  hydrocerin (EUCERIN) CREA Apply 1 application topically 2 (two) times daily.    [provider]  insulin glargine (LANTUS) 100 UNIT/ML injection Inject 5 Units into the skin at bedtime.     [provider]  insulin regular (NOVOLIN R,HUMULIN R) 100 units/mL injection Inject 4-14 Units into the skin 3 (three) times daily before meals. 0-149 = 0 units, 150-200 = 4 units, 201-250 = 7 units, 251-300 = 9 units, 301-350 = 12 units, 351-400 = 14 units, call MD > 400.    [provider]  lacosamide 100 MG TABS Take 1 tablet (100 mg total)  by mouth 2 (two) times daily. 08/22/17   Delfino Lovett, MD  lisinopril (PRINIVIL,ZESTRIL) 40 MG tablet Take 40 mg by mouth daily.    [provider]  Magnesium 400 MG TABS Take 400 mg by mouth daily.    [provider]  magnesium oxide (MAG-OX) 400 MG tablet Take 400 mg by mouth daily.     [provider]  metFORMIN (GLUCOPHAGE) 500 MG tablet Take 500 mg by mouth 2 (two) times daily with a meal.    [provider]  mirtazapine (REMERON) 7.5 MG tablet Take 7.5 mg by mouth at bedtime.    [provider]  pantoprazole (PROTONIX) 40 MG tablet Take 1 tablet (40 mg  total) by mouth 2 (two) times daily before a meal. 08/22/17   Delfino Lovett, MD  predniSONE (STERAPRED UNI-PAK 21 TAB) 10 MG (21) TBPK tablet Start 60 mg po daily, taper 10 mg daily until done 08/22/17   Delfino Lovett, MD  terazosin (HYTRIN) 5 MG capsule Take 5 mg by mouth at bedtime.    [provider]  vitamin B-12 (CYANOCOBALAMIN) 1000 MCG tablet Take 1,000 mcg by mouth daily.    [provider]    Allergies Daptomycin and Iodinated diagnostic agents  Family History  Problem Relation Age of Onset  . CAD Neg Hx     Social History Social History   Tobacco Use  . Smoking status: Current Every Day Smoker  . Smokeless tobacco: Never Used  Substance Use Topics  . Alcohol use: No    Comment: Drank heavily in the past per his cousin  . Drug use: No    Review of Systems  Constitutional: No fever/chills Eyes: No visual changes. ENT: No sore throat. Cardiovascular: Denies chest pain. Respiratory: Denies shortness of breath. Gastrointestinal: No abdominal pain.  No nausea, no vomiting.  No diarrhea.  No constipation. Genitourinary: Negative for dysuria. Musculoskeletal: Negative for back pain. Skin: Negative for rash. Neurological: Negative for headaches, focal weakness   ____________________________________________   PHYSICAL EXAM:  VITAL SIGNS: ED Triage Vitals [05/03/18 1047]  Enc Vitals Group     BP (!) 153/67     Pulse Rate 61     Resp 14     Temp 97.6 F (36.4 C)     Temp Source Oral     SpO2 99 %     Weight 170 lb (77.1 kg)     Height  (1.778 m)     Head Circumference      Peak Flow      Pain Score      Pain Loc      Pain Edu?      Excl. in GC?     Constitutional: Alert and oriented. Well appearing and in no acute distress. Eyes: Conjunctivae are normal.  Head: Atraumatic. Nose: No congestion/rhinnorhea. Mouth/Throat: Mucous membranes are moist.  Neck: No stridor.   Cardiovascular: Normal rate, regular rhythm. Grossly normal heart  sounds.  Good peripheral circulation with equal and bilateral radial pulses. Respiratory: Normal respiratory effort.  No retractions. Lungs CTAB. Gastrointestinal: Soft and nontender. No distention.  Musculoskeletal: Right lower extremity AKA.  Left lower extremity with what appear to be chronic stasis changes with thickened and hyper pigmented skin to the calf and foot.  Ulceration to the left heel without any tenderness.  Ulceration is through to the mucosal layer.  No tenderness.  No exudate.  No fluctuance.  Ulcerated areas about 3 x 4 cm and to the posterior  heel. Neurologic:  Normal speech and language. No gross focal neurologic deficits are appreciated. Skin:  Skin is warm, dry and intact. No rash noted. Psychiatric: Mood and affect are normal. Speech and behavior are normal.  ____________________________________________   LABS (all labs ordered are listed, but only abnormal results are displayed)  Labs Reviewed  CBC - Abnormal; Notable for the following components:      Result Value   WBC 10.8 (*)    RBC 3.58 (*)    Hemoglobin 9.9 (*)    HCT 30.4 (*)    RDW 16.7 (*)    All other components within normal limits  URINALYSIS, COMPLETE (UACMP) WITH MICROSCOPIC - Abnormal; Notable for the following components:   Color, Urine YELLOW (*)    APPearance CLEAR (*)    All other components within normal limits  COMPREHENSIVE METABOLIC PANEL - Abnormal; Notable for the following components:   Glucose, Bld 148 (*)    BUN 27 (*)    Creatinine, Ser 1.30 (*)    ALT 9 (*)    GFR calc non Af Amer 53 (*)    All other components within normal limits  TROPONIN I  TROPONIN I   ____________________________________________  EKG  ED ECG REPORT I, Arelia Longest, the attending physician, personally viewed and interpreted this ECG.   Date: 05/03/2018  EKG Time: 1046  Rate: 62  Rhythm: normal sinus rhythm  Axis: Normal  Intervals:none  ST&T Change: T wave inversions in 1 as well as  aVL.  No ST elevation or depression. No significant change from previous. ____________________________________________  RADIOLOGY  Stable cardiomegaly with a small left pleural effusion.  No acute intracranial abnormality on the CT of the head. ____________________________________________   PROCEDURES  Procedure(s) performed:   Procedures  Critical Care performed:   ____________________________________________   INITIAL IMPRESSION / ASSESSMENT AND PLAN / ED COURSE  Pertinent labs & imaging results that were available during my care of the patient were reviewed by me and considered in my medical decision making (see chart for details).  DDX: Dehydration, anemia, left leg abnormality, paresthesia, CVA, UTI As part of my medical decision making, I reviewed the following data within the electronic MEDICAL RECORD NUMBER Notes from prior ED visits  ----------------------------------------- 2:32 PM on 05/03/2018 -----------------------------------------  Patient at this time without any complaints.  Says that the tingling as well as lightheadedness have resolved after liter of fluid.  Very reassuring lab work including 2- troponins.  Patient with slight renal insufficiency.  Despite the patient stating that he stays well-hydrated at home his brother is here now and says that he hardly drinks any water and mostly drinks coffee.  We discussed increasing the water intake at home.  Patient not on any sort of diuretic so I feel that he will be able to tolerate some fluid increase.  Patient as well as family understanding of the diagnosis as well as treatment plan willing to comply.  Patient will be discharged at this time. ____________________________________________   FINAL CLINICAL IMPRESSION(S) / ED DIAGNOSES   Renal insufficiency.  Dehydration.   NEW MEDICATIONS STARTED DURING THIS VISIT:  New Prescriptions   No medications on file     Note:  This document was prepared using  Dragon voice recognition software and may include unintentional dictation errors.     Myrna Blazer, MD 05/03/18 [phone]

## 2018-05-03 NOTE — ED Triage Notes (Signed)
He arrives today via [facility] from [facility]  - pt reported lightheadedness with tingling in his arm and hands that began this am  Alert and oriented upon arrival

## 2018-05-03 NOTE — ED Notes (Signed)
Dry dressing applied to L heel by this RN. Pt instructed this RN that he uses a dry dressing and pt instructed this RN on how to put his boot on. Boot placed to L foot at this time. Explained delay to patient and family member. Pt and family state understanding at this time.

## 2018-05-03 NOTE — ED Notes (Signed)
Pt D/C back to [facility] via [employer]. Pt in stable condition at time of D/C.

## 2018-05-03 NOTE — ED Notes (Signed)
This RN apologized for and explained delay. Pt and family state understanding. Will continue to monitor for further patient needs. 2nd troponin collected at this time. Pt resting in bed. VSS at this time.

## 2018-07-03 IMAGING — CT CT ABD-PELV W/ CM
2 of 5 series · 15 of 46 positions shown, 17 images · IV contrast (APPLIED)
Comparison: None.

CLINICAL DATA: Vomiting, sepsis, leukocytosis and fever.

EXAM:
CT ABDOMEN AND PELVIS WITH CONTRAST
TECHNIQUE: Multidetector CT imaging of the abdomen and pelvis was performed
using the standard protocol following bolus administration of
intravenous contrast.
CONTRAST:  100mL 2O8CD0-222 IOPAMIDOL (2O8CD0-222) INJECTION 61%

[Series 2: routine abd/pel with · axial · 0.80mm/px · z∈[-1171,-756]mm · 12 of 95 slices shown, 14 images]
[im 6/95  soft-tissue]
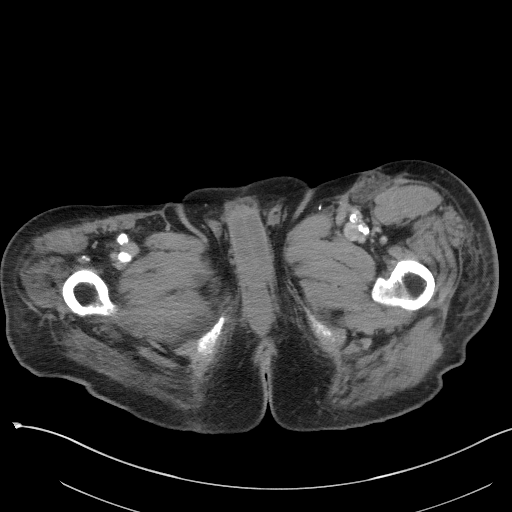
[im 6/95  bone]
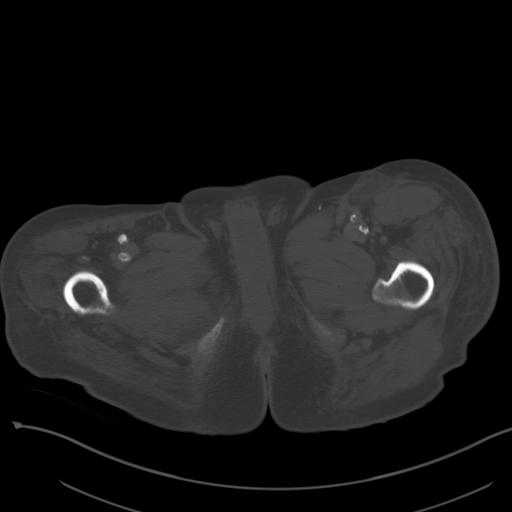
[im 16/95  soft-tissue]
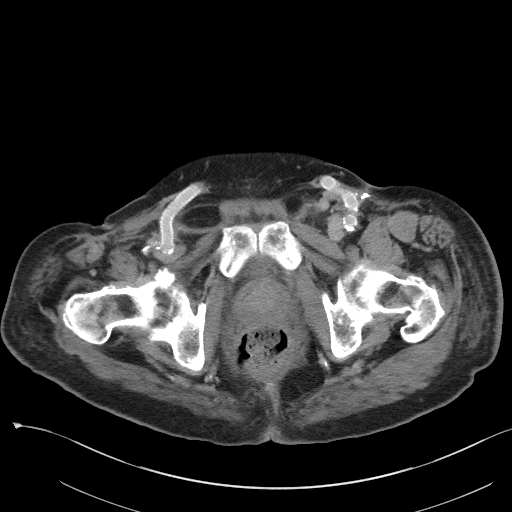
[im 21/95  soft-tissue]
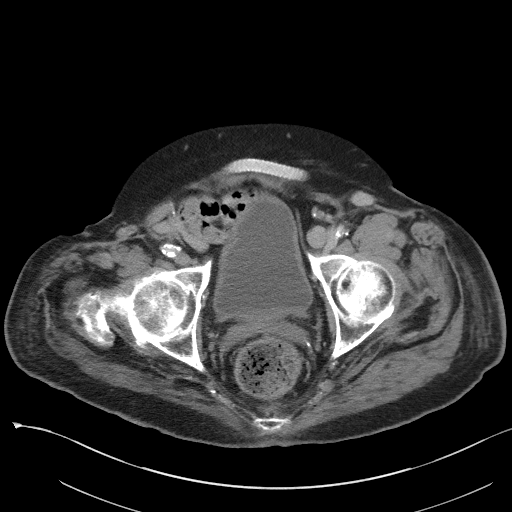
[im 27/95  soft-tissue]
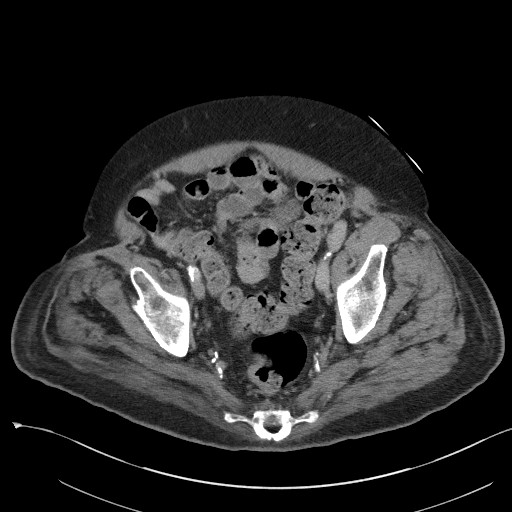
[im 37/95  soft-tissue]
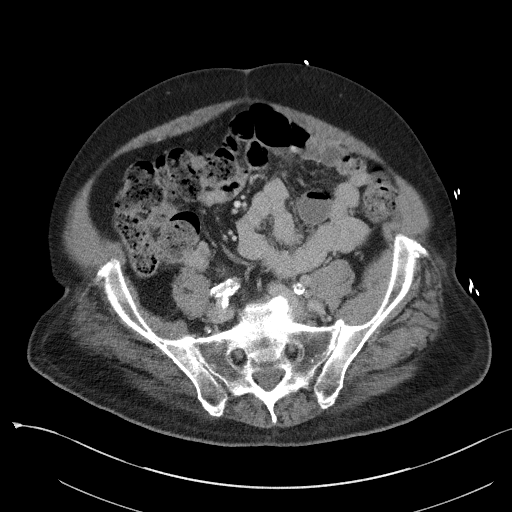
[im 42/95  soft-tissue]
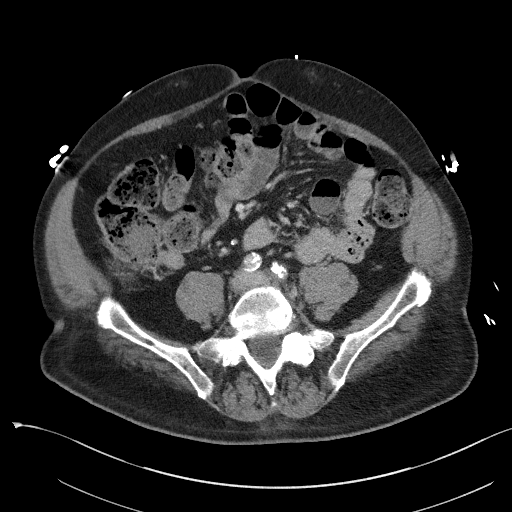
[im 53/95  soft-tissue]
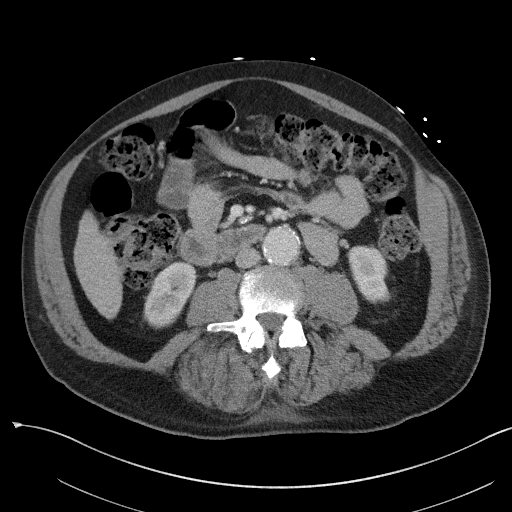
[im 58/95  soft-tissue]
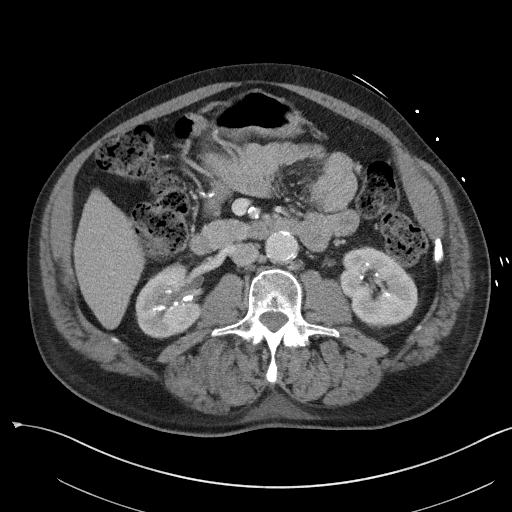
[im 68/95  soft-tissue]
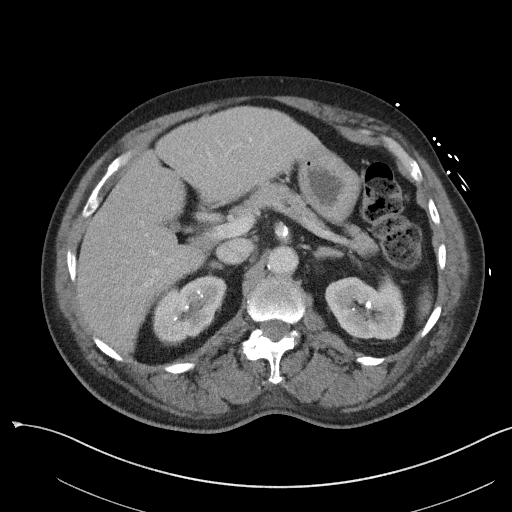
[im 68/95  bone]
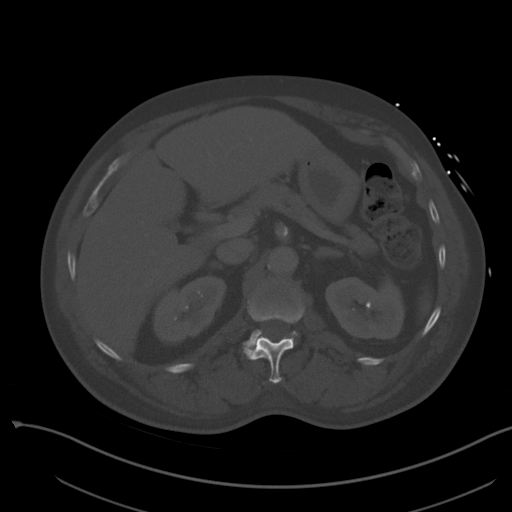
[im 74/95  soft-tissue]
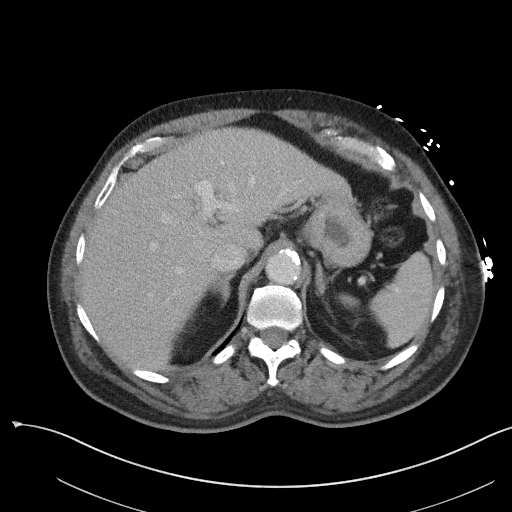
[im 79/95  soft-tissue]
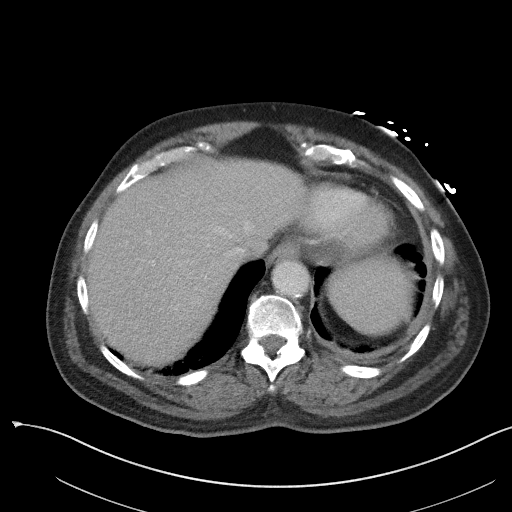
[im 89/95  soft-tissue]
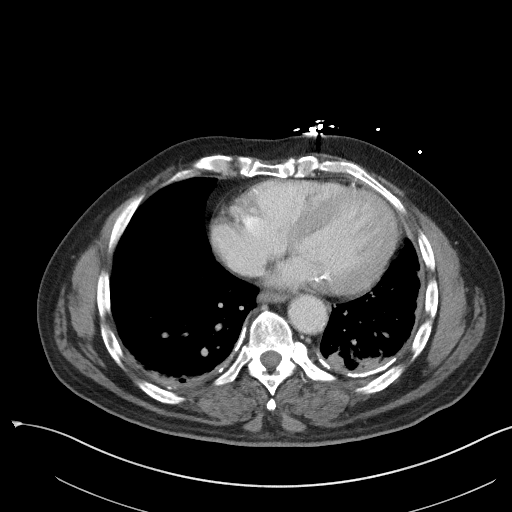

[Series 5: coronal st · coronal · 0.77mm/px · 3 of 101 slices shown]
[im 34/101  soft-tissue]
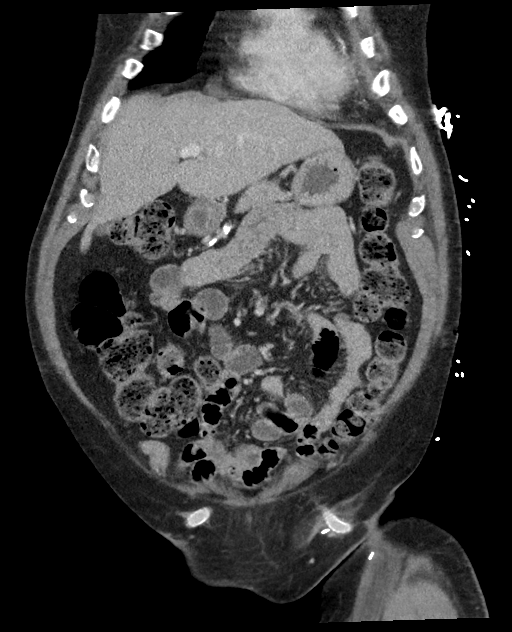
[im 45/101  soft-tissue]
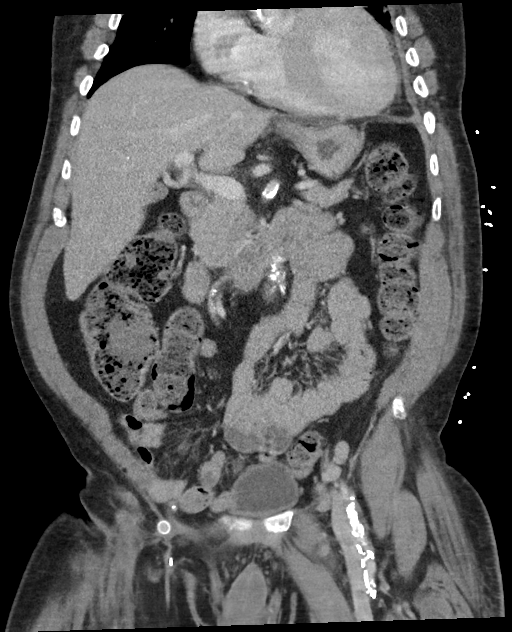
[im 56/101  soft-tissue]
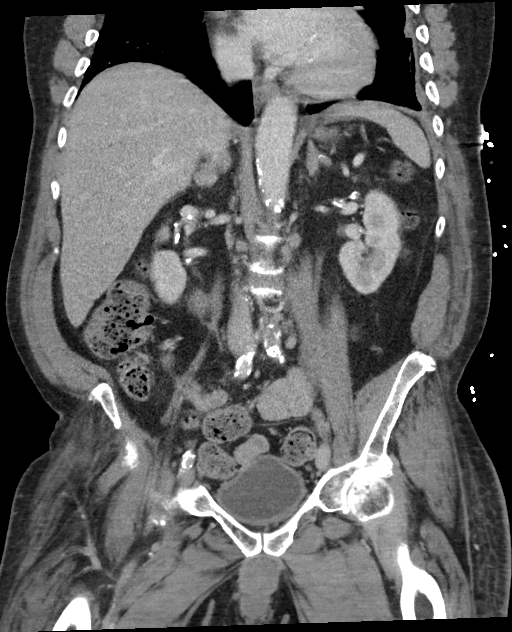

[15 of 46 positions shown; findings below may reference images not displayed]

FINDINGS: Lower chest: Chronic rounded atelectasis noted at the left lung base
present since prior CT scan 0774. Extensive pleural calcifications
bilaterally. No pleural mass. The heart is mildly enlarged.
Three-vessel coronary artery calcifications are noted.

Hepatobiliary: No focal hepatic lesions or intrahepatic biliary
dilatation. Gallbladder is mildly contracted. No common bile duct
dilatation. The portal and hepatic veins are patent.

Pancreas: No mass, inflammation or ductal dilatation.

Spleen: Normal size.  No focal lesions.

Adrenals/Urinary Tract: The adrenal glands and kidneys are
unremarkable except for extensive vascular calcifications. No
worrisome renal lesions or hydronephrosis.

Stomach/Bowel: The stomach, duodenum, small bowel and colon are
grossly normal. No inflammatory changes, mass lesions or obstructive
findings. Large amount of stool throughout the colon and down into
the rectum suggesting constipation. The terminal ileum is
unremarkable. The appendix is not identified. No findings to suggest
acute appendicitis.

Vascular/Lymphatic: Severe atherosclerotic calcifications involving
the aorta and branch vessels. Infrarenal abdominal aortic aneurysm
measuring 3.0 x 2.9 cm. No dissection. The left iliac artery is
occluded. There is a fem-fem bypass graft noted which appears
patent.

No mesenteric or retroperitoneal mass or lymphadenopathy. Small
scattered lymph nodes are noted.

Reproductive: The prostate gland and seminal vesicles are
unremarkable.

Other: Left-sided pelvic adenopathy is noted. Enlarged external
iliac lymph nodes on the left side. 14.5 mm node on image number 66,
13 mm node on image number 69. Common femoral lymph node on image
number 76 measures 18 mm. There are also enlarged inguinal nodes
with a 17 mm node on image number 84 and a 22 mm node on image
number 87. Could not exclude the possibility of lymphoma.

Musculoskeletal: No significant bony findings.
IMPRESSION: 1. Severe vascular disease with extensive aortic and branch vessel
calcifications. The left common iliac artery is occluded and there
is a fem-fem bypass graft which appears patent. Three-vessel
coronary artery calcifications are also noted.
2. Chronic rounded atelectasis at the left lung base.
3. Extensive pleural calcifications bilaterally.
4. Left-sided pelvic and inguinal lymphadenopathy. Could not exclude
lymphoma. Biopsy of one of the inguinal lymph nodes may be helpful.
No mesenteric or retroperitoneal lymphadenopathy.

## 2018-09-18 IMAGING — DX DG CHEST 1V PORT
1 series · 1 of 1 positions shown · non-contrast
Comparison: 08/15/2017 at [DATE] a.m.

CLINICAL DATA: Central line placement.

EXAM:
PORTABLE CHEST 1 VIEW [DATE] p.m.

[chest ap]
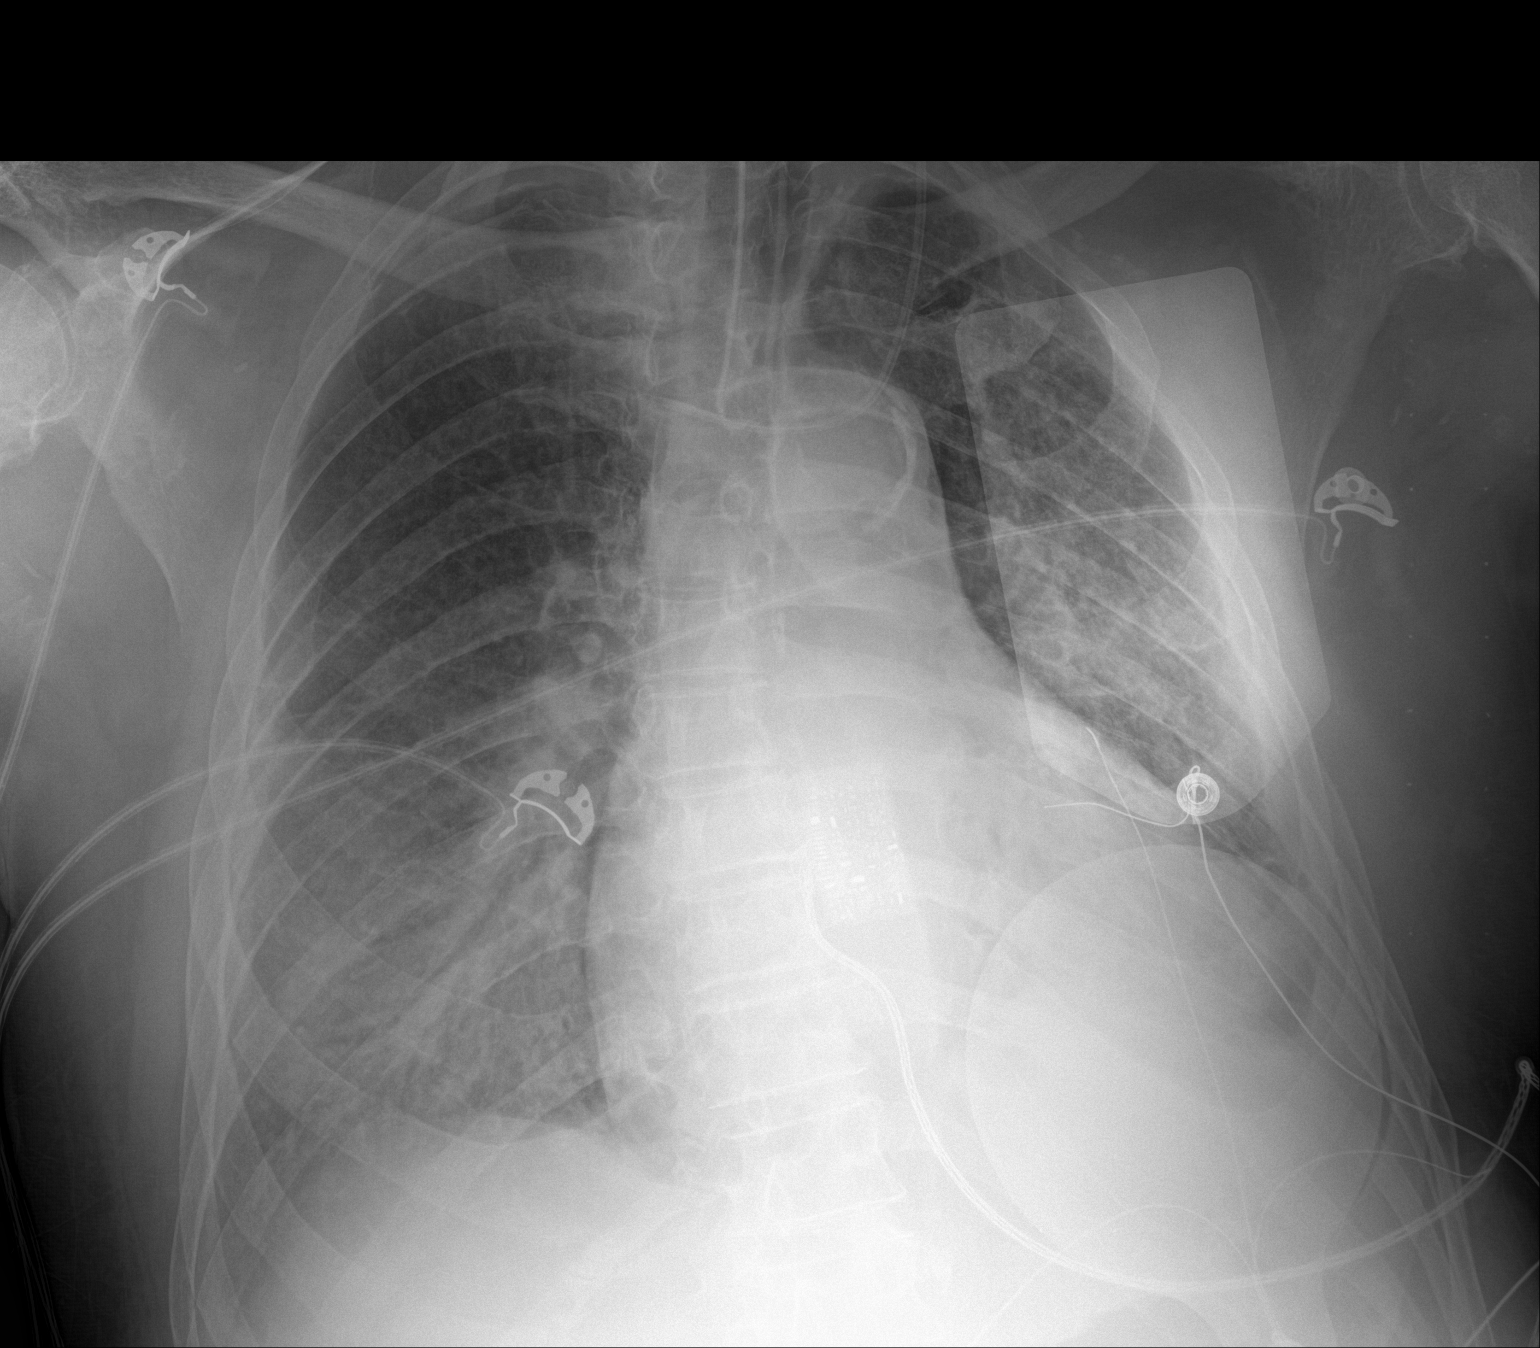

[1 of 1 positions shown; findings below may reference images not displayed]

FINDINGS: New left internal jugular catheter has been inserted. The tip is at
the junction of the left innominate vein and superior vena cava but
directed toward the wall of the SVC.

Endotracheal tube tip is 4.5 cm above the carina in good position.

The patient has developed faint pulmonary edema in the right mid and
lower lung zone. There is slight haziness in the left perihilar
region and left lung base, slightly increased.

Aortic atherosclerosis.
IMPRESSION: 1. New left central venous catheter tip is in the superior vena
cava, directed toward the wall.
2. New pulmonary edema on the right. Persistent pulmonary edema on
the left.
3. Aortic atherosclerosis.
4. Endotracheal tube in good position.

## 2018-09-19 IMAGING — DX DG CHEST 1V PORT
1 series · 1 of 1 positions shown · non-contrast
Comparison: 08/15/2017

CLINICAL DATA: Intubated, CPR

EXAM:
PORTABLE CHEST 1 VIEW

[chest ap]
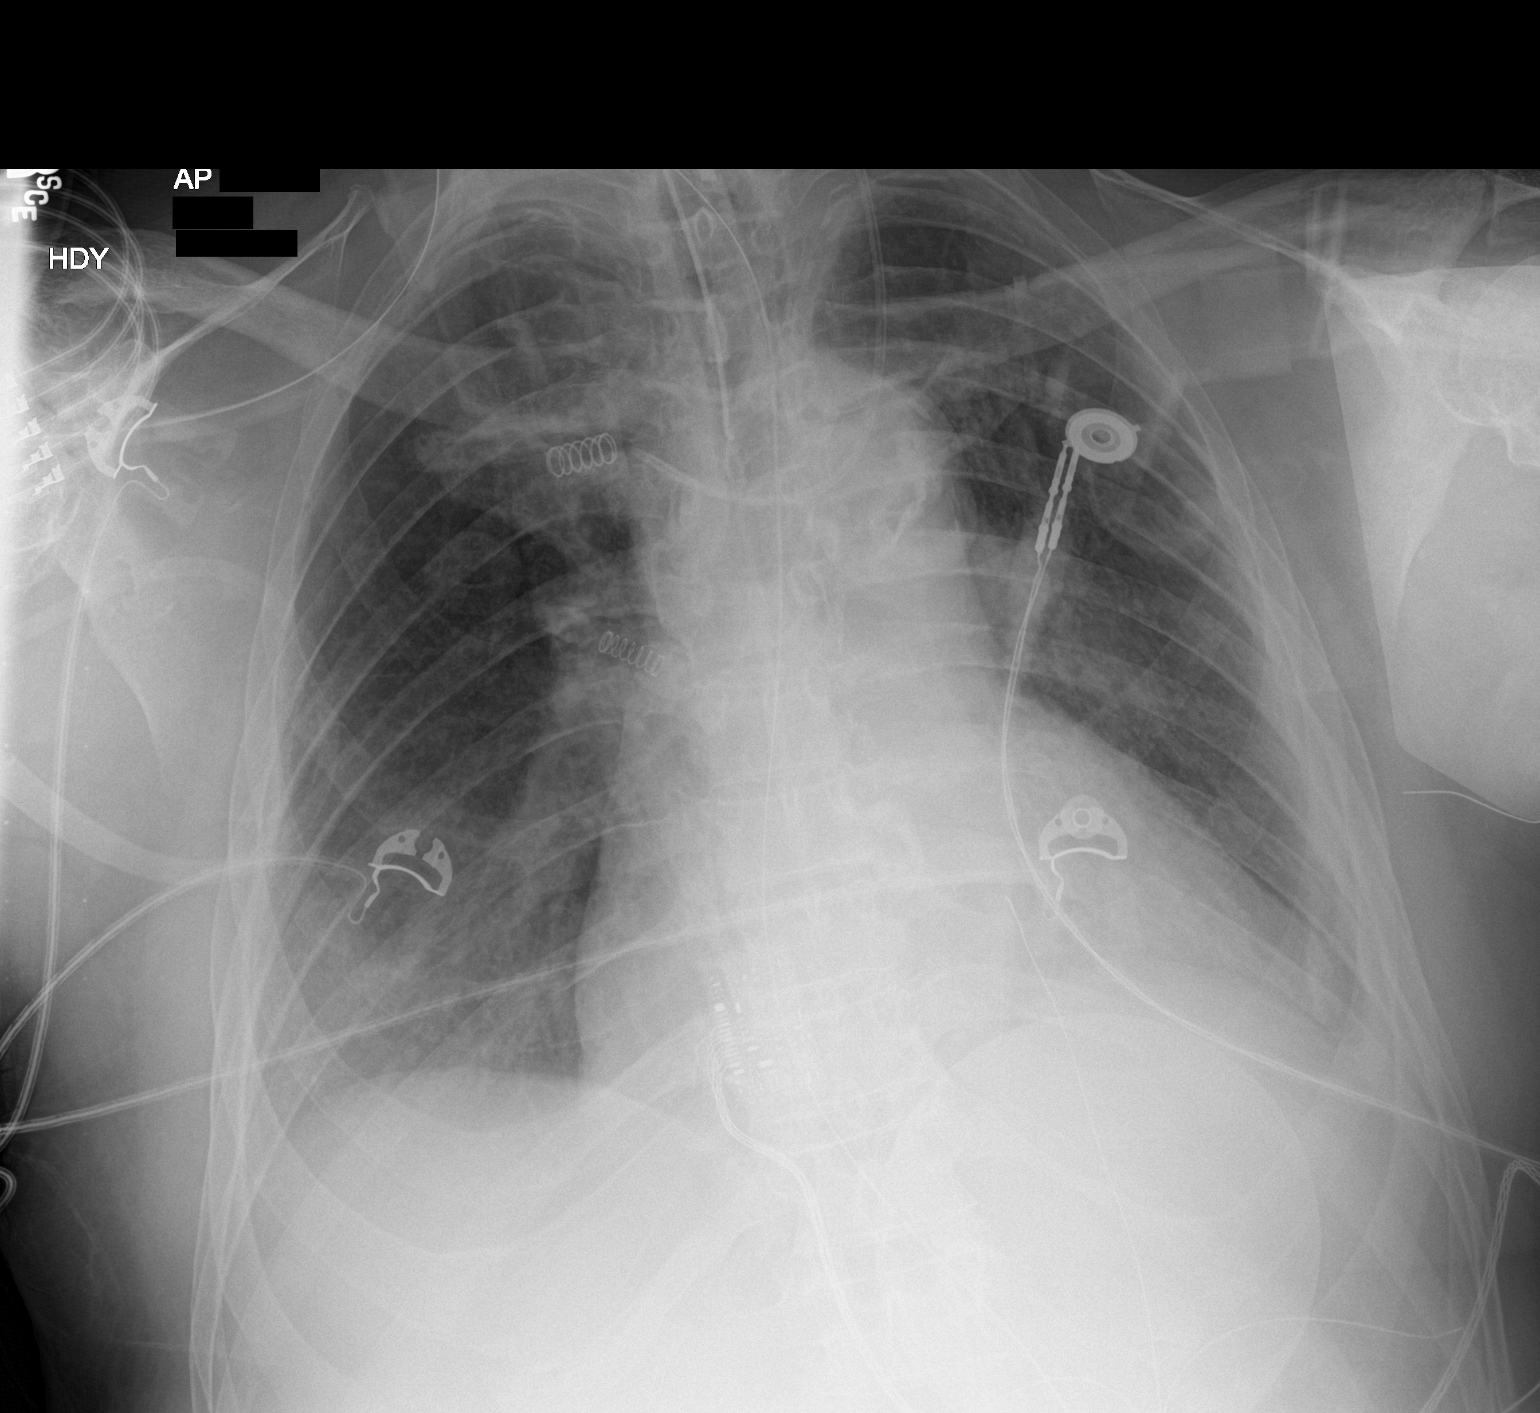

[1 of 1 positions shown; findings below may reference images not displayed]

FINDINGS: Endotracheal tube 4.6 cm above the carina. Left IJ central line tip
at the innominate venous confluence, unchanged. NG tube enters the
stomach with the tip not visualized.

Cardiomegaly evident with vascular congestion versus mild edema.
Suspect small pleural effusions layering posteriorly with associated
basilar atelectasis.

No new focal pneumonia, collapse or consolidation.  No pneumothorax.
IMPRESSION: Stable support apparatus.

Cardiomegaly with mild edema pattern and trace pleural effusions

Minor basilar atelectasis

No significant change compared to yesterday

## 2018-09-21 IMAGING — DX DG CHEST 1V PORT
1 series · 1 of 1 positions shown · non-contrast
Comparison: One-view chest x-ray 08/16/2017

CLINICAL DATA: Endotracheally intubated.

EXAM:
PORTABLE CHEST 1 VIEW

[chest ap]
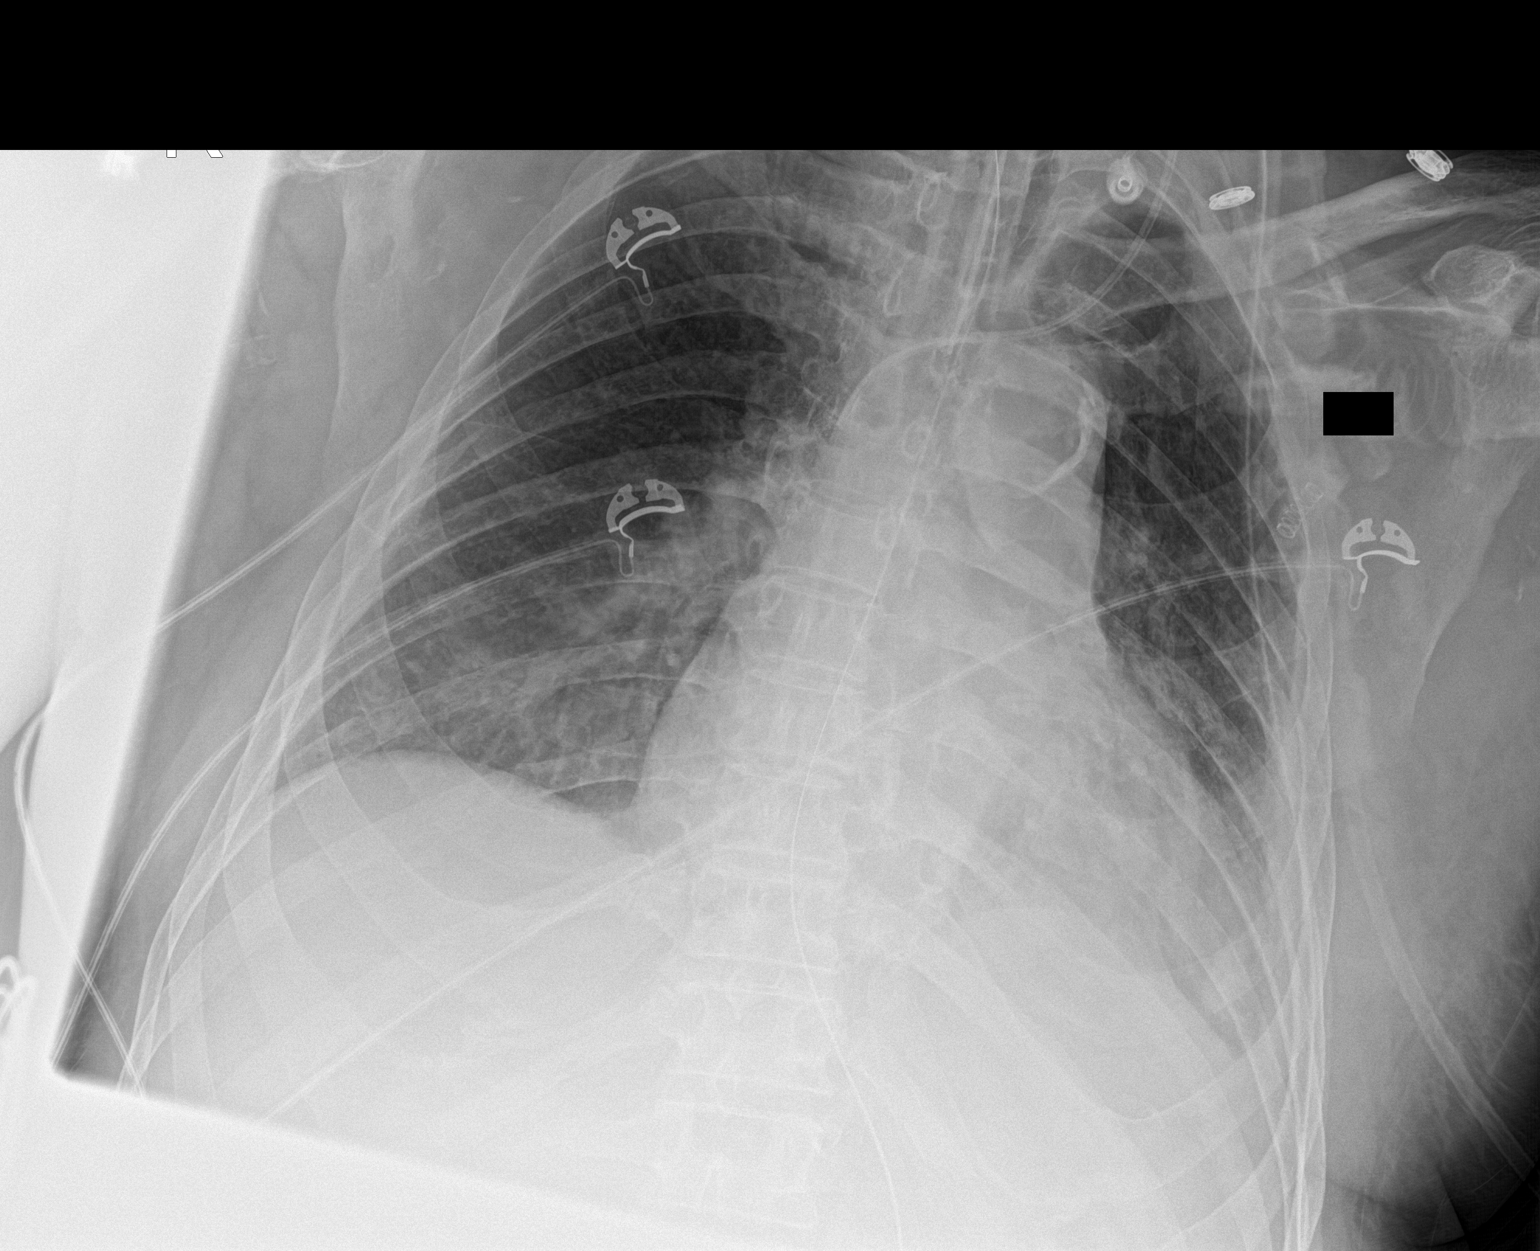

[1 of 1 positions shown; findings below may reference images not displayed]

FINDINGS: Endotracheal tube is stable. The tip of the left IJ line is now
directed inferiorly within the SVC. The NG tube courses off the
inferior border of the film.

The heart is enlarged. Mild edema and left greater than right
pleural effusions are stable. Left greater than right basilar
airspace disease is stable.
IMPRESSION: 1. Stable cardiomegaly, interstitial edema, and left greater than
right pleural effusions compatible with congestive heart failure.
2. Left greater than right basilar airspace disease likely reflects
atelectasis.
3. The tip of the left IJ line is now directed inferiorly within the
SVC.
4. The support apparatus is otherwise stable.

## 2019-05-04 ENCOUNTER — Emergency Department
Admission: EM | Admit: 2019-05-04 | Discharge: 2019-05-05 | Disposition: A | Discharge status: Other Acute Inpatient Hospital | Payer: Medicare Other | Attending: Emergency Medicine | Admitting: Emergency Medicine

## 2019-05-04 ENCOUNTER — Encounter: Payer: Self-pay | Admitting: Emergency Medicine

## 2019-05-04 ENCOUNTER — Other Ambulatory Visit: Payer: Self-pay

## 2019-05-04 ENCOUNTER — Emergency Department: Payer: Medicare Other

## 2019-05-04 ENCOUNTER — Ambulatory Visit (HOSPITAL_COMMUNITY)
Admission: AD | Admit: 2019-05-04 | Discharge: 2019-05-04 | Disposition: A | Discharge status: Home / Self Care | Payer: Medicare Other | Source: Other Acute Inpatient Hospital | Attending: Emergency Medicine | Admitting: Emergency Medicine

## 2019-05-04 DIAGNOSIS — R509 Fever, unspecified: Secondary | ICD-10-CM | POA: Diagnosis present

## 2019-05-04 DIAGNOSIS — U071 COVID-19: Secondary | ICD-10-CM | POA: Insufficient documentation

## 2019-05-04 DIAGNOSIS — I10 Essential (primary) hypertension: Secondary | ICD-10-CM | POA: Diagnosis not present

## 2019-05-04 DIAGNOSIS — Z794 Long term (current) use of insulin: Secondary | ICD-10-CM | POA: Insufficient documentation

## 2019-05-04 DIAGNOSIS — F17200 Nicotine dependence, unspecified, uncomplicated: Secondary | ICD-10-CM | POA: Diagnosis not present

## 2019-05-04 DIAGNOSIS — E119 Type 2 diabetes mellitus without complications: Secondary | ICD-10-CM | POA: Insufficient documentation

## 2019-05-04 DIAGNOSIS — Z79899 Other long term (current) drug therapy: Secondary | ICD-10-CM | POA: Diagnosis not present

## 2019-05-04 LAB — COMPREHENSIVE METABOLIC PANEL
ALT: 13 U/L (ref 0–44)
AST: 18 U/L (ref 15–41)
Albumin: 3.7 g/dL (ref 3.5–5.0)
Alkaline Phosphatase: 73 U/L (ref 38–126)
Anion gap: 9 (ref 5–15)
BUN: 34 mg/dL — ABNORMAL HIGH (ref 8–23)
CO2: 22 mmol/L (ref 22–32)
Calcium: 8.6 mg/dL — ABNORMAL LOW (ref 8.9–10.3)
Chloride: 103 mmol/L (ref 98–111)
Creatinine, Ser: 1.78 mg/dL — ABNORMAL HIGH (ref 0.61–1.24)
GFR calc Af Amer: 43 mL/min — ABNORMAL LOW (ref >60)
GFR calc non Af Amer: 37 mL/min — ABNORMAL LOW (ref >60)
Glucose, Bld: 96 mg/dL (ref 70–99)
Potassium: 4.8 mmol/L (ref 3.5–5.1)
Sodium: 134 mmol/L — ABNORMAL LOW (ref 135–145)
Total Bilirubin: 0.6 mg/dL (ref 0.3–1.2)
Total Protein: 8.1 g/dL (ref 6.5–8.1)

## 2019-05-04 LAB — CBC WITH DIFFERENTIAL/PLATELET
Abs Immature Granulocytes: 0.07 10*3/uL (ref 0.00–0.07)
Basophils Absolute: 0.1 10*3/uL (ref 0.0–0.1)
Basophils Relative: 0 %
Eosinophils Absolute: 0 10*3/uL (ref 0.0–0.5)
Eosinophils Relative: 0 %
HCT: 29.1 % — ABNORMAL LOW (ref 39.0–52.0)
Hemoglobin: 8.9 g/dL — ABNORMAL LOW (ref 13.0–17.0)
Immature Granulocytes: 1 %
Lymphocytes Relative: 20 %
Lymphs Abs: 2.4 10*3/uL (ref 0.7–4.0)
MCH: 27.6 pg (ref 26.0–34.0)
MCHC: 30.6 g/dL (ref 30.0–36.0)
MCV: 90.4 fL (ref 80.0–100.0)
Monocytes Absolute: 1 10*3/uL (ref 0.1–1.0)
Monocytes Relative: 8 %
Neutro Abs: 8.4 10*3/uL — ABNORMAL HIGH (ref 1.7–7.7)
Neutrophils Relative %: 71 %
Platelets: 314 10*3/uL (ref 150–400)
RBC: 3.22 MIL/uL — ABNORMAL LOW (ref 4.22–5.81)
RDW: 14.1 % (ref 11.5–15.5)
WBC: 11.9 10*3/uL — ABNORMAL HIGH (ref 4.0–10.5)
nRBC: 0 % (ref 0.0–0.2)

## 2019-05-04 LAB — URINALYSIS, COMPLETE (UACMP) WITH MICROSCOPIC
Bacteria, UA: NONE SEEN
Bilirubin Urine: NEGATIVE
Glucose, UA: NEGATIVE mg/dL
Ketones, ur: NEGATIVE mg/dL
Leukocytes,Ua: NEGATIVE
Nitrite: NEGATIVE
Protein, ur: NEGATIVE mg/dL
Specific Gravity, Urine: 1.014 (ref 1.005–1.030)
pH: 5 (ref 5.0–8.0)

## 2019-05-04 LAB — SARS CORONAVIRUS 2 BY RT PCR (HOSPITAL ORDER, PERFORMED IN ~~LOC~~ HOSPITAL LAB): SARS Coronavirus 2: POSITIVE — AB

## 2019-05-04 LAB — LACTIC ACID, PLASMA: Lactic Acid, Venous: 1 mmol/L (ref 0.5–1.9)

## 2019-05-04 MED ORDER — SODIUM CHLORIDE 0.9 % IV BOLUS
500.0000 mL | Freq: Once | INTRAVENOUS | Status: AC
  Administered 2019-05-04: 500 mL via INTRAVENOUS

## 2019-05-04 NOTE — ED Notes (Signed)
Pt assisted with peri-care and brief changed at this time. This RN will continue to monitor.

## 2019-05-04 NOTE — ED Notes (Signed)
Gave report to Angie, Charity fundraiser at [facility] and made her aware pt is ready for discharge. Per Angie pt needs to be transported back to the facility by EMS

## 2019-05-04 NOTE — ED Notes (Addendum)
Pt with o2 sats 86% RA and improved to 88% with encouraged deep breathing. Good wave form. Now 95% 2LNC. Jessee Avers PA notified by text.

## 2019-05-04 NOTE — ED Triage Notes (Signed)
Pt ems from [facility] for fever, hypoxia. Pt swabbed today for covid, no results yet. Pt temp 99.1, sats 97% here.

## 2019-05-04 NOTE — ED Provider Notes (Signed)
[facility] Emergency Department Provider Note  ____________________________________________  Time seen: Approximately 1:39 PM  I have reviewed the triage vital signs and the nursing notes.   HISTORY  Chief Complaint Fever    HPI Blake Villarreal is a 73 y.o. male who presents the emergency department via EMS from [name] for complaint of fever, possible shortness of breath.  Patient reports that he is a resident at [facility] which has a outbreak of coronavirus.  Patient reports that they routinely have the temperatures checked in today his temperature was elevated at 100.1 F.  Patient reports that he is completely asymptomatic with no nasal congestion, sore throat, and cough, shortness of breath, chest pain, abdominal pain, nausea vomiting, diarrhea or constipation.  Patient reports that he did not want to come to the emergency department but they "made me."  Patient is very upset and having to be here as he states that "I feel good."         Past Medical History:  Diagnosis Date  . Diabetes mellitus without complication (HCC)   . Hypertension   . Intermittent confusion   . PAD (peripheral artery disease) [facility])     Patient Active Problem List   Diagnosis Date Noted  . Healthcare-associated pneumonia   . DNR (do not resuscitate) discussion   . Palliative care encounter   . Pressure injury of skin 08/16/2017  . Acute encephalopathy   . Anemia 08/15/2017  . Acute respiratory failure [facility])     Past Surgical History:  Procedure Laterality Date  . ABOVE KNEE LEG AMPUTATION      Prior to Admission medications   Medication Sig Start Date End Date Taking? Authorizing Provider  acetaminophen (TYLENOL) 325 MG tablet Take 650 mg by mouth every 6 (six) hours as needed.    [provider]  albuterol (PROVENTIL) (2.5 MG/3ML) 0.083% nebulizer solution Take 2 ampules by nebulization every 6 (six) hours as needed for wheezing.    [provider]  amLODipine (NORVASC) 10 MG tablet Take 10 mg by mouth daily.    [provider]  aspirin 81 MG chewable tablet Chew 81 mg by mouth daily.    [provider]  atorvastatin (LIPITOR) 20 MG tablet Take 20 mg by mouth daily.    [provider]  Cholecalciferol (VITAMIN D3) 2000 units TABS Take 1 tablet by mouth daily.    [provider]  clopidogrel (PLAVIX) 75 MG tablet Take 75 mg by mouth daily.    [provider]  docusate sodium (COLACE) 100 MG capsule Take 100 mg by mouth daily as needed.     [provider]  gabapentin (NEURONTIN) 100 MG capsule Take 100 mg by mouth 3 (three) times daily.    [provider]  hydrALAZINE (APRESOLINE) 25 MG tablet Take 1 tablet (25 mg total) by mouth 3 (three) times daily. 08/22/17   Delfino Lovett, MD  hydrocerin (EUCERIN) CREA Apply 1 application topically 2 (two) times daily.    [provider]  insulin glargine (LANTUS) 100 UNIT/ML injection Inject 5 Units into the skin at bedtime.     [provider]  insulin regular (NOVOLIN R,HUMULIN R) 100 units/mL injection Inject 4-14 Units into the skin 3 (three) times daily before meals. 0-149 = 0 units, 150-200 = 4 units, 201-250 = 7 units, 251-300 = 9 units, 301-350 = 12 units, 351-400 = 14 units, call MD > 400.    [provider]  lacosamide 100 MG TABS Take 1 tablet (100 mg total) by mouth 2 (two) times daily. 08/22/17   Delfino Lovett, MD  lisinopril (PRINIVIL,ZESTRIL) 40 MG tablet Take 40 mg by mouth daily.    [provider]  Magnesium 400 MG TABS Take 400 mg by mouth daily.    [provider]  magnesium oxide (MAG-OX) 400 MG tablet Take 400 mg by mouth daily.     [provider]  metFORMIN (GLUCOPHAGE) 500 MG tablet Take 500 mg by mouth 2 (two) times daily with a meal.    [provider]  mirtazapine (REMERON) 7.5 MG tablet Take 7.5 mg by mouth at bedtime.    [provider]   pantoprazole (PROTONIX) 40 MG tablet Take 1 tablet (40 mg total) by mouth 2 (two) times daily before a meal. 08/22/17   Delfino Lovett, MD  predniSONE (STERAPRED UNI-PAK 21 TAB) 10 MG (21) TBPK tablet Start 60 mg po daily, taper 10 mg daily until done 08/22/17   Delfino Lovett, MD  terazosin (HYTRIN) 5 MG capsule Take 5 mg by mouth at bedtime.    [provider]  vitamin B-12 (CYANOCOBALAMIN) 1000 MCG tablet Take 1,000 mcg by mouth daily.    [provider]    Allergies Daptomycin and Iodinated diagnostic agents  Family History  Problem Relation Age of Onset  . CAD Neg Hx     Social History Social History   Tobacco Use  . Smoking status: Current Every Day Smoker  . Smokeless tobacco: Never Used  Substance Use Topics  . Alcohol use: No    Comment: Drank heavily in the past per his cousin  . Drug use: No     Review of Systems  Constitutional: Positive for low-grade fever.  Denies chills.  Eyes: No visual changes. No discharge ENT: No upper respiratory complaints. Cardiovascular: no chest pain. Respiratory: no cough. No SOB. Gastrointestinal: No abdominal pain.  No nausea, no vomiting.  No diarrhea.  No constipation. Genitourinary: Negative for dysuria. No hematuria Musculoskeletal: Negative for musculoskeletal pain. Skin: Negative for rash, abrasions, lacerations, ecchymosis. Neurological: Negative for headaches, focal weakness or numbness. 10-point ROS otherwise negative.  ____________________________________________   PHYSICAL EXAM:  VITAL SIGNS: ED Triage Vitals  Enc Vitals Group     BP      Pulse      Resp      Temp      Temp src      SpO2      Weight      Height      Head Circumference      Peak Flow      Pain Score      Pain Loc      Pain Edu?      Excl. in GC?      Constitutional: Alert and oriented. Well appearing and in no acute distress. Eyes: Conjunctivae are normal. PERRL. EOMI. Head: Atraumatic. ENT:      Ears: EACs and TMs  unremarkable bilaterally.      Nose: No congestion/rhinnorhea.      Mouth/Throat: Mucous membranes are moist.  Oropharynx is nonerythematous and nonedematous.  Uvula is midline. Neck: No stridor.  Neck is supple full range of motion Hematological/Lymphatic/Immunilogical: No cervical lymphadenopathy. Cardiovascular: Normal rate, regular rhythm. Normal S1 and S2.  Good peripheral circulation. Respiratory: Normal respiratory effort without tachypnea or retractions. Lungs CTAB. Good air entry to the bases with no decreased or absent breath sounds. Musculoskeletal: Full range of motion to  all extremities. No gross deformities appreciated. Neurologic:  Normal speech and language. No gross focal neurologic deficits are appreciated.  Skin:  Skin is warm, dry and intact. No rash noted. Psychiatric: Mood and affect are normal. Speech and behavior are normal. Patient exhibits appropriate insight and judgement.   ____________________________________________   LABS (all labs ordered are listed, but only abnormal results are displayed)  Labs Reviewed  SARS CORONAVIRUS 2 (HOSPITAL ORDER, PERFORMED IN [facility]) - Abnormal; Notable for the following components:      Result Value   SARS Coronavirus 2 POSITIVE (*)    All other components within normal limits  COMPREHENSIVE METABOLIC PANEL - Abnormal; Notable for the following components:   Sodium 134 (*)    BUN 34 (*)    Creatinine, Ser 1.78 (*)    Calcium 8.6 (*)    GFR calc non Af Amer 37 (*)    GFR calc Af Amer 43 (*)    All other components within normal limits  CBC WITH DIFFERENTIAL/PLATELET - Abnormal; Notable for the following components:   WBC 11.9 (*)    RBC 3.22 (*)    Hemoglobin 8.9 (*)    HCT 29.1 (*)    Neutro Abs 8.4 (*)    All other components within normal limits  URINALYSIS, COMPLETE (UACMP) WITH MICROSCOPIC - Abnormal; Notable for the following components:   Color, Urine YELLOW (*)    APPearance CLEAR (*)     Hgb urine dipstick SMALL (*)    All other components within normal limits  LACTIC ACID, PLASMA  LACTIC ACID, PLASMA   ____________________________________________  EKG   ____________________________________________  RADIOLOGY I personally viewed and evaluated these images as part of my medical decision making, as well as reviewing the written report by the radiologist.  Dg Chest 1 View  Result Date: 05/04/2019 CLINICAL DATA:  Shortness of breath and fever EXAM: CHEST  1 VIEW COMPARISON:  May 03, 2018 FINDINGS: There Is a small left pleural effusion with scarring in the left base. Lungs elsewhere are clear. There is cardiomegaly with pulmonary vascularity normal. No adenopathy. There is aortic atherosclerosis. No bone lesions evident. IMPRESSION: Scarring left base with small left pleural effusion. No frank edema or consolidation. Stable cardiomegaly. Aortic Atherosclerosis (ICD10-I70.0). Electronically Signed   By: Bretta Bang III M.D.   On: 05/04/2019 14:23    ____________________________________________    PROCEDURES  Procedure(s) performed:    Procedures    Medications - No data to display   ____________________________________________   INITIAL IMPRESSION / ASSESSMENT AND PLAN / ED COURSE  Pertinent labs & imaging results that were available during my care of the patient were reviewed by me and considered in my medical decision making (see chart for details).  Review of the [place] CSRS was performed in accordance of the [facility] prior to dispensing any controlled drugs.  Clinical Course as of May 03 1710  Tue May 04, 2019  1443 Patient presented to the emergency department via EMS from [facility].  [place] an outbreak area in this county of coronavirus.  Patient reports that he had an incidental finding of elevated temperature of 100.1 F today.  Patient was sent to the emergency department for further evaluation for COVID-19.  Patient denies any other symptoms  or complaints at this time.  The patient is very upset that he was sent to the emergency department for evaluation.  Differential includes viral URI, COVID-19, incidental finding of elevated temperature.  Patient will  be evaluated with chest x-ray, basic labs including covid test.   [JC]    Clinical Course User Index [JC] Cuthriell, Delorise RoyalsJonathan D, PA-C       The patient was evaluated for the symptoms described in the history of present illness. The patient was evaluated in the context of the global COVID-19 pandemic, which necessitated consideration that the patient might be at risk for infection with the SARS-CoV-2 virus that causes COVID-19. Institutional protocols and algorithms that pertain to the evaluation of patients at risk for COVID-19 are in a state of rapid change based on information released by regulatory bodies including the CDC and federal and state organizations. The most current policies and algorithms were followed during the patient's care in the ED.     Patient's diagnosis is consistent with COVID-19.  [facility]  Patient states that he had his temperature checked today, it was 100.1 F.  Patient was sent to the emergency department for evaluation as there is been a significant outbreak of COVID-19 in this facility.  Patient denied any complaints at this time, stating "I did not want to be here."  Patient does return positive however for COVID-19 infection.  Labs are otherwise stable.  Patient continues to deny any complaints.  Patient is stable for discharge at this time. Patient is given ED precautions to return to the ED for any worsening or new symptoms.     ____________________________________________  FINAL CLINICAL IMPRESSION(S) / ED DIAGNOSES  Final diagnoses:  COVID-19      NEW MEDICATIONS STARTED DURING THIS VISIT:  ED Discharge Orders    None          This chart was dictated using voice  recognition software/Dragon. Despite best efforts to proofread, errors can occur which can change the meaning. Any change was purely unintentional.    Racheal PatchesCuthriell, Jonathan D, PA-C 05/04/19 1711    Emily FilbertWilliams, Jonathan E, MD 05/05/19 [phone]

## 2019-05-04 NOTE — ED Notes (Signed)
[facility]  CALLED  FOR  TRANSPORT 

## 2019-05-05 NOTE — ED Notes (Signed)
Report called to [facility] at this time by this RN.

## 2019-05-05 NOTE — ED Notes (Signed)
Pt cleaned and new brief put on. Pt repositioned in bed. TV turned on by this RN. Pt denies any other needs at this time. This RN will continue to monitor.

## 2020-05-20 ENCOUNTER — Emergency Department: Payer: No Typology Code available for payment source

## 2020-05-20 ENCOUNTER — Other Ambulatory Visit: Payer: Self-pay

## 2020-05-20 ENCOUNTER — Inpatient Hospital Stay
Admission: EM | Admit: 2020-05-20 | Discharge: 2020-05-25 | DRG: 871 | Disposition: A | Discharge status: Skilled Nursing Facility | Payer: No Typology Code available for payment source | Source: Skilled Nursing Facility | Attending: Internal Medicine | Admitting: Internal Medicine

## 2020-05-20 ENCOUNTER — Encounter: Payer: Self-pay | Admitting: Family Medicine

## 2020-05-20 DIAGNOSIS — I5033 Acute on chronic diastolic (congestive) heart failure: Secondary | ICD-10-CM | POA: Diagnosis not present

## 2020-05-20 DIAGNOSIS — F17200 Nicotine dependence, unspecified, uncomplicated: Secondary | ICD-10-CM | POA: Diagnosis present

## 2020-05-20 DIAGNOSIS — E1122 Type 2 diabetes mellitus with diabetic chronic kidney disease: Secondary | ICD-10-CM | POA: Diagnosis present

## 2020-05-20 DIAGNOSIS — E11649 Type 2 diabetes mellitus with hypoglycemia without coma: Secondary | ICD-10-CM | POA: Diagnosis not present

## 2020-05-20 DIAGNOSIS — I130 Hypertensive heart and chronic kidney disease with heart failure and stage 1 through stage 4 chronic kidney disease, or unspecified chronic kidney disease: Secondary | ICD-10-CM | POA: Diagnosis present

## 2020-05-20 DIAGNOSIS — Z881 Allergy status to other antibiotic agents status: Secondary | ICD-10-CM

## 2020-05-20 DIAGNOSIS — J9601 Acute respiratory failure with hypoxia: Secondary | ICD-10-CM | POA: Diagnosis present

## 2020-05-20 DIAGNOSIS — E1151 Type 2 diabetes mellitus with diabetic peripheral angiopathy without gangrene: Secondary | ICD-10-CM | POA: Diagnosis present

## 2020-05-20 DIAGNOSIS — R6520 Severe sepsis without septic shock: Secondary | ICD-10-CM | POA: Diagnosis present

## 2020-05-20 DIAGNOSIS — L89213 Pressure ulcer of right hip, stage 3: Secondary | ICD-10-CM | POA: Diagnosis present

## 2020-05-20 DIAGNOSIS — K219 Gastro-esophageal reflux disease without esophagitis: Secondary | ICD-10-CM | POA: Diagnosis present

## 2020-05-20 DIAGNOSIS — R0602 Shortness of breath: Secondary | ICD-10-CM

## 2020-05-20 DIAGNOSIS — N1832 Chronic kidney disease, stage 3b: Secondary | ICD-10-CM | POA: Diagnosis present

## 2020-05-20 DIAGNOSIS — Z993 Dependence on wheelchair: Secondary | ICD-10-CM

## 2020-05-20 DIAGNOSIS — Z89611 Acquired absence of right leg above knee: Secondary | ICD-10-CM

## 2020-05-20 DIAGNOSIS — L03116 Cellulitis of left lower limb: Secondary | ICD-10-CM | POA: Diagnosis present

## 2020-05-20 DIAGNOSIS — E1142 Type 2 diabetes mellitus with diabetic polyneuropathy: Secondary | ICD-10-CM | POA: Diagnosis present

## 2020-05-20 DIAGNOSIS — Z91041 Radiographic dye allergy status: Secondary | ICD-10-CM | POA: Diagnosis not present

## 2020-05-20 DIAGNOSIS — N179 Acute kidney failure, unspecified: Secondary | ICD-10-CM | POA: Diagnosis not present

## 2020-05-20 DIAGNOSIS — I5031 Acute diastolic (congestive) heart failure: Secondary | ICD-10-CM | POA: Diagnosis not present

## 2020-05-20 DIAGNOSIS — D631 Anemia in chronic kidney disease: Secondary | ICD-10-CM | POA: Diagnosis present

## 2020-05-20 DIAGNOSIS — N189 Chronic kidney disease, unspecified: Secondary | ICD-10-CM | POA: Diagnosis not present

## 2020-05-20 DIAGNOSIS — E1165 Type 2 diabetes mellitus with hyperglycemia: Secondary | ICD-10-CM | POA: Diagnosis not present

## 2020-05-20 DIAGNOSIS — T1490XA Injury, unspecified, initial encounter: Secondary | ICD-10-CM | POA: Diagnosis not present

## 2020-05-20 DIAGNOSIS — J9602 Acute respiratory failure with hypercapnia: Secondary | ICD-10-CM | POA: Diagnosis not present

## 2020-05-20 DIAGNOSIS — G9341 Metabolic encephalopathy: Secondary | ICD-10-CM | POA: Diagnosis present

## 2020-05-20 DIAGNOSIS — Z79899 Other long term (current) drug therapy: Secondary | ICD-10-CM

## 2020-05-20 DIAGNOSIS — I509 Heart failure, unspecified: Secondary | ICD-10-CM | POA: Diagnosis not present

## 2020-05-20 DIAGNOSIS — Z7982 Long term (current) use of aspirin: Secondary | ICD-10-CM

## 2020-05-20 DIAGNOSIS — A419 Sepsis, unspecified organism: Principal | ICD-10-CM | POA: Diagnosis present

## 2020-05-20 DIAGNOSIS — G9340 Encephalopathy, unspecified: Secondary | ICD-10-CM | POA: Diagnosis not present

## 2020-05-20 DIAGNOSIS — Z7902 Long term (current) use of antithrombotics/antiplatelets: Secondary | ICD-10-CM

## 2020-05-20 DIAGNOSIS — Z794 Long term (current) use of insulin: Secondary | ICD-10-CM

## 2020-05-20 DIAGNOSIS — R4182 Altered mental status, unspecified: Secondary | ICD-10-CM

## 2020-05-20 DIAGNOSIS — E119 Type 2 diabetes mellitus without complications: Secondary | ICD-10-CM | POA: Diagnosis not present

## 2020-05-20 DIAGNOSIS — Z8616 Personal history of COVID-19: Secondary | ICD-10-CM

## 2020-05-20 DIAGNOSIS — E785 Hyperlipidemia, unspecified: Secondary | ICD-10-CM | POA: Diagnosis present

## 2020-05-20 DIAGNOSIS — R7989 Other specified abnormal findings of blood chemistry: Secondary | ICD-10-CM

## 2020-05-20 LAB — COMPREHENSIVE METABOLIC PANEL
ALT: 12 U/L (ref 0–44)
AST: 18 U/L (ref 15–41)
Albumin: 3.5 g/dL (ref 3.5–5.0)
Alkaline Phosphatase: 68 U/L (ref 38–126)
Anion gap: 8 (ref 5–15)
BUN: 36 mg/dL — ABNORMAL HIGH (ref 8–23)
CO2: 24 mmol/L (ref 22–32)
Calcium: 8.8 mg/dL — ABNORMAL LOW (ref 8.9–10.3)
Chloride: 102 mmol/L (ref 98–111)
Creatinine, Ser: 2.08 mg/dL — ABNORMAL HIGH (ref 0.61–1.24)
GFR calc Af Amer: 35 mL/min — ABNORMAL LOW (ref >60)
GFR calc non Af Amer: 30 mL/min — ABNORMAL LOW (ref >60)
Glucose, Bld: 130 mg/dL — ABNORMAL HIGH (ref 70–99)
Potassium: 3.5 mmol/L (ref 3.5–5.1)
Sodium: 134 mmol/L — ABNORMAL LOW (ref 135–145)
Total Bilirubin: 0.7 mg/dL (ref 0.3–1.2)
Total Protein: 8.1 g/dL (ref 6.5–8.1)

## 2020-05-20 LAB — CBC WITH DIFFERENTIAL/PLATELET
Abs Immature Granulocytes: 0.27 10*3/uL — ABNORMAL HIGH (ref 0.00–0.07)
Basophils Absolute: 0.1 10*3/uL (ref 0.0–0.1)
Basophils Relative: 0 %
Eosinophils Absolute: 0.3 10*3/uL (ref 0.0–0.5)
Eosinophils Relative: 1 %
HCT: 26.5 % — ABNORMAL LOW (ref 39.0–52.0)
Hemoglobin: 8.2 g/dL — ABNORMAL LOW (ref 13.0–17.0)
Immature Granulocytes: 1 %
Lymphocytes Relative: 11 %
Lymphs Abs: 3.1 10*3/uL (ref 0.7–4.0)
MCH: 27.2 pg (ref 26.0–34.0)
MCHC: 30.9 g/dL (ref 30.0–36.0)
MCV: 88 fL (ref 80.0–100.0)
Monocytes Absolute: 1.5 10*3/uL — ABNORMAL HIGH (ref 0.1–1.0)
Monocytes Relative: 5 %
Neutro Abs: 23.7 10*3/uL — ABNORMAL HIGH (ref 1.7–7.7)
Neutrophils Relative %: 82 %
Platelets: 481 10*3/uL — ABNORMAL HIGH (ref 150–400)
RBC: 3.01 MIL/uL — ABNORMAL LOW (ref 4.22–5.81)
RDW: 14.2 % (ref 11.5–15.5)
Smear Review: NORMAL
WBC: 28.9 10*3/uL — ABNORMAL HIGH (ref 4.0–10.5)
nRBC: 0 % (ref 0.0–0.2)

## 2020-05-20 LAB — URINALYSIS, COMPLETE (UACMP) WITH MICROSCOPIC
Bacteria, UA: NONE SEEN
Bilirubin Urine: NEGATIVE
Glucose, UA: NEGATIVE mg/dL
Hgb urine dipstick: NEGATIVE
Ketones, ur: NEGATIVE mg/dL
Leukocytes,Ua: NEGATIVE
Nitrite: NEGATIVE
Protein, ur: NEGATIVE mg/dL
Specific Gravity, Urine: 1.012 (ref 1.005–1.030)
pH: 5 (ref 5.0–8.0)

## 2020-05-20 LAB — LACTIC ACID, PLASMA: Lactic Acid, Venous: 0.7 mmol/L (ref 0.5–1.9)

## 2020-05-20 LAB — GLUCOSE, CAPILLARY: Glucose-Capillary: 125 mg/dL — ABNORMAL HIGH (ref 70–99)

## 2020-05-20 LAB — SARS CORONAVIRUS 2 BY RT PCR (HOSPITAL ORDER, PERFORMED IN ~~LOC~~ HOSPITAL LAB): SARS Coronavirus 2: NEGATIVE

## 2020-05-20 LAB — TROPONIN I (HIGH SENSITIVITY)
Troponin I (High Sensitivity): 19 ng/L — ABNORMAL HIGH (ref <18)
Troponin I (High Sensitivity): 17 ng/L (ref <18)

## 2020-05-20 MED ORDER — INSULIN ASPART 100 UNIT/ML ~~LOC~~ SOLN
0.0000 [IU] | Freq: Three times a day (TID) | SUBCUTANEOUS | Status: DC
  Administered 2020-05-21: 2 [IU] via SUBCUTANEOUS
  Administered 2020-05-21 – 2020-05-22 (×2): 3 [IU] via SUBCUTANEOUS
  Administered 2020-05-22 (×3): 2 [IU] via SUBCUTANEOUS
  Administered 2020-05-23: 1 [IU] via SUBCUTANEOUS
  Administered 2020-05-23 – 2020-05-24 (×4): 3 [IU] via SUBCUTANEOUS
  Administered 2020-05-24: 7 [IU] via SUBCUTANEOUS
  Administered 2020-05-24: 2 [IU] via SUBCUTANEOUS
  Administered 2020-05-24: 7 [IU] via SUBCUTANEOUS
  Administered 2020-05-25 (×3): 3 [IU] via SUBCUTANEOUS
  Filled 2020-05-20 (×17): qty 1

## 2020-05-20 MED ORDER — HYDRALAZINE HCL 25 MG PO TABS
25.0000 mg | ORAL_TABLET | Freq: Three times a day (TID) | ORAL | Status: DC
  Administered 2020-05-21 – 2020-05-25 (×15): 25 mg via ORAL
  Filled 2020-05-20 (×15): qty 1

## 2020-05-20 MED ORDER — VITAMIN D 25 MCG (1000 UNIT) PO TABS
1000.0000 [IU] | ORAL_TABLET | Freq: Every day | ORAL | Status: DC
  Administered 2020-05-21 – 2020-05-25 (×4): 1000 [IU] via ORAL
  Filled 2020-05-20 (×5): qty 1

## 2020-05-20 MED ORDER — SODIUM CHLORIDE 0.9% FLUSH
3.0000 mL | INTRAVENOUS | Status: DC | PRN
  Administered 2020-05-22: 3 mL via INTRAVENOUS

## 2020-05-20 MED ORDER — SODIUM CHLORIDE 0.9% FLUSH
3.0000 mL | Freq: Two times a day (BID) | INTRAVENOUS | Status: DC
  Administered 2020-05-21 – 2020-05-25 (×10): 3 mL via INTRAVENOUS

## 2020-05-20 MED ORDER — VITAMIN B-12 1000 MCG PO TABS
1000.0000 ug | ORAL_TABLET | Freq: Every day | ORAL | Status: DC
  Administered 2020-05-21 – 2020-05-25 (×5): 1000 ug via ORAL
  Filled 2020-05-20 (×6): qty 1

## 2020-05-20 MED ORDER — ZOLPIDEM TARTRATE 5 MG PO TABS: 5.0000 mg | ORAL_TABLET | Freq: Every evening | ORAL | Status: DC | PRN

## 2020-05-20 MED ORDER — INSULIN GLARGINE 100 UNIT/ML ~~LOC~~ SOLN: 5.0000 [IU] | Freq: Every day | SUBCUTANEOUS | Status: DC

## 2020-05-20 MED ORDER — MIRTAZAPINE 15 MG PO TABS
7.5000 mg | ORAL_TABLET | Freq: Every day | ORAL | Status: DC
  Administered 2020-05-21 – 2020-05-24 (×5): 7.5 mg via ORAL
  Filled 2020-05-20 (×5): qty 1

## 2020-05-20 MED ORDER — SODIUM CHLORIDE 0.9 % IV BOLUS
500.0000 mL | Freq: Once | INTRAVENOUS | Status: AC
  Administered 2020-05-20: 500 mL via INTRAVENOUS

## 2020-05-20 MED ORDER — TERAZOSIN HCL 5 MG PO CAPS
5.0000 mg | ORAL_CAPSULE | Freq: Every day | ORAL | Status: DC
  Administered 2020-05-21 – 2020-05-24 (×4): 5 mg via ORAL
  Filled 2020-05-20 (×6): qty 1

## 2020-05-20 MED ORDER — ALPRAZOLAM 0.25 MG PO TABS: 0.2500 mg | ORAL_TABLET | Freq: Two times a day (BID) | ORAL | Status: DC | PRN

## 2020-05-20 MED ORDER — VANCOMYCIN HCL IN DEXTROSE 1-5 GM/200ML-% IV SOLN: 1000.0000 mg | Freq: Once | INTRAVENOUS | Status: DC

## 2020-05-20 MED ORDER — ENOXAPARIN SODIUM 40 MG/0.4ML ~~LOC~~ SOLN
40.0000 mg | SUBCUTANEOUS | Status: DC
  Administered 2020-05-21 – 2020-05-24 (×5): 40 mg via SUBCUTANEOUS
  Filled 2020-05-20 (×5): qty 0.4

## 2020-05-20 MED ORDER — GABAPENTIN 100 MG PO CAPS
100.0000 mg | ORAL_CAPSULE | Freq: Two times a day (BID) | ORAL | Status: DC
  Administered 2020-05-21 – 2020-05-25 (×10): 100 mg via ORAL
  Filled 2020-05-20 (×10): qty 1

## 2020-05-20 MED ORDER — VANCOMYCIN HCL 2000 MG/400ML IV SOLN
2000.0000 mg | Freq: Once | INTRAVENOUS | Status: AC
  Administered 2020-05-20: 2000 mg via INTRAVENOUS
  Filled 2020-05-20: qty 400

## 2020-05-20 MED ORDER — INSULIN REGULAR HUMAN 100 UNIT/ML IJ SOLN: 4.0000 [IU] | Freq: Three times a day (TID) | INTRAMUSCULAR | Status: DC

## 2020-05-20 MED ORDER — SODIUM CHLORIDE 0.9 % IV SOLN
2.0000 g | Freq: Once | INTRAVENOUS | Status: AC
  Administered 2020-05-20: 2 g via INTRAVENOUS
  Filled 2020-05-20: qty 20

## 2020-05-20 MED ORDER — ATORVASTATIN CALCIUM 20 MG PO TABS
20.0000 mg | ORAL_TABLET | Freq: Every day | ORAL | Status: DC
  Administered 2020-05-21 – 2020-05-25 (×6): 20 mg via ORAL
  Filled 2020-05-20 (×6): qty 1

## 2020-05-20 MED ORDER — ASPIRIN 81 MG PO CHEW
81.0000 mg | CHEWABLE_TABLET | Freq: Every day | ORAL | Status: DC
  Administered 2020-05-21 – 2020-05-25 (×5): 81 mg via ORAL
  Filled 2020-05-20 (×5): qty 1

## 2020-05-20 MED ORDER — MAGNESIUM OXIDE 400 (241.3 MG) MG PO TABS
400.0000 mg | ORAL_TABLET | Freq: Every day | ORAL | Status: DC
  Administered 2020-05-21 – 2020-05-25 (×6): 400 mg via ORAL
  Filled 2020-05-20 (×6): qty 1

## 2020-05-20 MED ORDER — ACETAMINOPHEN 325 MG PO TABS
650.0000 mg | ORAL_TABLET | ORAL | Status: DC | PRN
  Administered 2020-05-21 – 2020-05-25 (×4): 650 mg via ORAL
  Filled 2020-05-20 (×4): qty 2

## 2020-05-20 MED ORDER — SODIUM CHLORIDE 0.9 % IV SOLN: 250.0000 mL | INTRAVENOUS | Status: DC | PRN

## 2020-05-20 MED ORDER — PANTOPRAZOLE SODIUM 40 MG PO TBEC
40.0000 mg | DELAYED_RELEASE_TABLET | Freq: Two times a day (BID) | ORAL | Status: DC
  Administered 2020-05-21 – 2020-05-25 (×10): 40 mg via ORAL
  Filled 2020-05-20 (×10): qty 1

## 2020-05-20 MED ORDER — LACOSAMIDE 50 MG PO TABS
100.0000 mg | ORAL_TABLET | Freq: Two times a day (BID) | ORAL | Status: DC
  Administered 2020-05-21 – 2020-05-25 (×10): 100 mg via ORAL
  Filled 2020-05-20 (×10): qty 2

## 2020-05-20 MED ORDER — ONDANSETRON HCL 4 MG/2ML IJ SOLN: 4.0000 mg | Freq: Four times a day (QID) | INTRAMUSCULAR | Status: DC | PRN

## 2020-05-20 MED ORDER — FUROSEMIDE 10 MG/ML IJ SOLN
40.0000 mg | Freq: Two times a day (BID) | INTRAMUSCULAR | Status: DC
  Administered 2020-05-21 – 2020-05-22 (×5): 40 mg via INTRAVENOUS
  Filled 2020-05-20 (×5): qty 4

## 2020-05-20 MED ORDER — CLOPIDOGREL BISULFATE 75 MG PO TABS
75.0000 mg | ORAL_TABLET | Freq: Every day | ORAL | Status: DC
  Administered 2020-05-21 – 2020-05-25 (×5): 75 mg via ORAL
  Filled 2020-05-20 (×5): qty 1

## 2020-05-20 MED ORDER — LISINOPRIL 10 MG PO TABS: 40.0000 mg | ORAL_TABLET | Freq: Every day | ORAL | Status: DC

## 2020-05-20 MED ORDER — ALBUTEROL SULFATE (2.5 MG/3ML) 0.083% IN NEBU: 5.0000 mg | INHALATION_SOLUTION | Freq: Four times a day (QID) | RESPIRATORY_TRACT | Status: DC | PRN

## 2020-05-20 MED ORDER — DOCUSATE SODIUM 100 MG PO CAPS: 100.0000 mg | ORAL_CAPSULE | Freq: Every day | ORAL | Status: DC | PRN

## 2020-05-20 MED ORDER — AMLODIPINE BESYLATE 10 MG PO TABS
10.0000 mg | ORAL_TABLET | Freq: Every day | ORAL | Status: DC
  Administered 2020-05-21 – 2020-05-25 (×4): 10 mg via ORAL
  Filled 2020-05-20 (×5): qty 1

## 2020-05-20 NOTE — ED Notes (Signed)
O2 sat at 87-89% while sleeping, placed on 2L, currently at 93%.

## 2020-05-20 NOTE — ED Notes (Addendum)
[name] (family) was called (POA) to provide update.  She asked that we call when/if he transfers to the [name], no matter what time it is.  [phone]

## 2020-05-20 NOTE — ED Provider Notes (Signed)
Discussed patient with Dr. Bryson Corona at the [facility].  He declines transfer at this point wants [place] to admit him at least overnight and if he needs to stay in longer than they can consider transfer from the floor.  He said the [name] wants to minimize ER to ER transport at this point.   Arnaldo Natal, MD 05/20/20 [phone]

## 2020-05-20 NOTE — ED Triage Notes (Signed)
Pt arrives via ems from [facility] with decreased loc, staff reported that when they checked his blood sugar it was low at 60, which is abnormal for the patient, pt was administered glucagon via [facility] staff, bringing the patient's blood sugar to 130 for ems, pt will wake up but falls back asleep.pt was found to be diaphoretic by ems. Pt able to squeeze strong and equally with hands bilat and able to hold both arms up equally without drift.  Staff reported last seen well time was at 0900

## 2020-05-20 NOTE — H&P (Addendum)
[place] at [facility]   PATIENT NAME: Blake Villarreal    MR#:  [id number]  DATE OF BIRTH:  1946-10-02  DATE OF ADMISSION:  05/20/2020  PRIMARY CARE PHYSICIAN: Keane Police, MD   REQUESTING/REFERRING PHYSICIAN: Dorothea Glassman, MD  CHIEF COMPLAINT:   Chief Complaint  Patient presents with  . Altered Mental Status    HISTORY OF PRESENT ILLNESS:  Blake Villarreal  is a 73 y.o. African-American male with a known history of type 2 diabetes mellitus, hypertension and peripheral artery disease, who presented to the emergency room with acute onset of altered mental status with decreased responsiveness and a blood glucose of 60 with improvement after being given IM glucagon however without initial significant improvement of his mental status.  During my interview he was much more alert and cooperative and oriented x3.  He has been having no significant cough or wheezing or dyspnea however in the ER he dropped his pulse 70-80 7-89% on room air and that went up to.  He does have left lower extremity edema with chronic wounds.  No fever or chills.  He denied any nausea or vomiting or abdominal pain.  No chest pain or palpitations.  Upon presentation to the emergency room, blood pressure was 166/56 and otherwise vital signs were within normal.  Labs revealed sodium of 134 potassium 3.5, BUN of 36 and creatinine of 2.08 compared to 34/1.78 last month.  High-sensitivity troponin I was 19.  CBC showed leukocytosis 28.9 with neutrophilia and anemia close to baseline as well as thrombocytosis. EKG showed normal sinus rhythm with rate of 61 with poor R wave progression and probable left atrial enlargement and left axis deviation.  Chest x-ray showed mild diffuse interstitial pulmonary opacity likely edema in the setting of cardiomegaly with no focal airspace opacity.  The patient was given IV Rocephin and vancomycin as well as 500 mill IV normal saline.  He will be admitted to a progressive  unit bed for further evaluation and management. PAST MEDICAL HISTORY:   Past Medical History:  Diagnosis Date  . Diabetes mellitus without complication (HCC)   . Hypertension   . Intermittent confusion   . PAD (peripheral artery disease) (HCC)   History of COVID-19 on 05/04/2019  PAST SURGICAL HISTORY:   Past Surgical History:  Procedure Laterality Date  . ABOVE KNEE LEG AMPUTATION      SOCIAL HISTORY:   Social History   Tobacco Use  . Smoking status: Current Every Day Smoker  . Smokeless tobacco: Never Used  Substance Use Topics  . Alcohol use: No    Comment: Drank heavily in the past per his cousin    FAMILY HISTORY:   Family History  Problem Relation Age of Onset  . CAD Neg Hx     DRUG ALLERGIES:   Allergies  Allergen Reactions  . Daptomycin   . Iodinated Diagnostic Agents     REVIEW OF SYSTEMS:   ROS As per history of present illness. All pertinent systems were reviewed above. Constitutional,  HEENT, cardiovascular, respiratory, GI, GU, musculoskeletal, neuro, psychiatric, endocrine,  integumentary and hematologic systems were reviewed and are otherwise  negative/unremarkable except for positive findings mentioned above in the HPI.   MEDICATIONS AT HOME:   Prior to Admission medications   Medication Sig Start Date End Date Taking? Authorizing Provider  amLODipine (NORVASC) 10 MG tablet Take 10 mg by mouth daily.   Yes [provider]  aspirin 81 MG chewable tablet Chew 81 mg by mouth  daily.   Yes [provider]  atorvastatin (LIPITOR) 20 MG tablet Take 20 mg by mouth daily.   Yes [provider]  Cholecalciferol (VITAMIN D3) 2000 units TABS Take 1 tablet by mouth daily.   Yes [provider]  clopidogrel (PLAVIX) 75 MG tablet Take 75 mg by mouth daily.   Yes [provider]  gabapentin (NEURONTIN) 100 MG capsule Take 100 mg by mouth 3 (three) times daily.   Yes [provider]  hydrALAZINE  (APRESOLINE) 25 MG tablet Take 1 tablet (25 mg total) by mouth 3 (three) times daily. 08/22/17  Yes Delfino Lovett, MD  insulin glargine (LANTUS) 100 UNIT/ML injection Inject 5 Units into the skin at bedtime.    Yes [provider]  insulin regular (NOVOLIN R,HUMULIN R) 100 units/mL injection Inject 4-14 Units into the skin 3 (three) times daily before meals. 0-149 = 0 units, 150-200 = 4 units, 201-250 = 7 units, 251-300 = 9 units, 301-350 = 12 units, 351-400 = 14 units, call MD > 400.   Yes [provider]  lacosamide 100 MG TABS Take 1 tablet (100 mg total) by mouth 2 (two) times daily. 08/22/17  Yes Delfino Lovett, MD  lisinopril (PRINIVIL,ZESTRIL) 40 MG tablet Take 40 mg by mouth daily.   Yes [provider]  magnesium oxide (MAG-OX) 400 MG tablet Take 400 mg by mouth daily.    Yes [provider]  metFORMIN (GLUCOPHAGE) 500 MG tablet Take 500 mg by mouth 2 (two) times daily with a meal.   Yes [provider]  mirtazapine (REMERON) 7.5 MG tablet Take 7.5 mg by mouth at bedtime.   Yes [provider]  pantoprazole (PROTONIX) 40 MG tablet Take 1 tablet (40 mg total) by mouth 2 (two) times daily before a meal. 08/22/17  Yes Delfino Lovett, MD  terazosin (HYTRIN) 5 MG capsule Take 5 mg by mouth at bedtime.   Yes [provider]  vitamin B-12 (CYANOCOBALAMIN) 1000 MCG tablet Take 1,000 mcg by mouth daily.   Yes [provider]  acetaminophen (TYLENOL) 325 MG tablet Take 650 mg by mouth every 6 (six) hours as needed.    [provider]  albuterol (PROVENTIL) (2.5 MG/3ML) 0.083% nebulizer solution Take 2 ampules by nebulization every 6 (six) hours as needed for wheezing.    [provider]  docusate sodium (COLACE) 100 MG capsule Take 100 mg by mouth daily as needed.     [provider]  hydrocerin (EUCERIN) CREA Apply 1 application topically 2 (two) times daily.    [provider]  predniSONE (STERAPRED UNI-PAK  21 TAB) 10 MG (21) TBPK tablet Start 60 mg po daily, taper 10 mg daily until done Patient not taking: Reported on 05/20/2020 08/22/17   Delfino Lovett, MD      VITAL SIGNS:  Blood pressure (!) 143/55, pulse (!) 57, temperature 97.8 F (36.6 C), temperature source Oral, resp. rate 13, height 5\' 9"  (1.753 m), weight 81.6 kg, SpO2 99 %.  PHYSICAL EXAMINATION:  Physical Exam  GENERAL:  74 y.o.-year-old African-American male patient lying in the bed with no acute distress.  EYES: Pupils equal, round, reactive to light and accommodation. No scleral icterus. Extraocular muscles intact.  HEENT: Head atraumatic, normocephalic. Oropharynx and nasopharynx clear.  NECK:  Supple, no jugular venous distention. No thyroid enlargement, no tenderness.  LUNGS: Diminished bibasal breath sounds with bibasal rales. CARDIOVASCULAR: Regular rate and rhythm, S1, S2 normal. No murmurs, rubs, or gallops.  ABDOMEN:  Soft, nondistended, nontender. Bowel sounds present. No organomegaly or mass.  EXTREMITIES: He is status post right above-knee amputation.  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  NEUROLOGIC: Cranial nerves II through XII are intact. Muscle strength 5/5 in all extremities. Sensation intact. Gait not checked.  PSYCHIATRIC: The patient is alert and oriented x 3.  Normal affect and good eye contact. SKIN: As above.  He has perineal and right ischial decubitus ulcers/skin tears with clean base.    LABORATORY PANEL:   CBC Recent Labs  Lab 05/20/20 1219  WBC 28.9*  HGB 8.2*  HCT 26.5*  PLT 481*   ------------------------------------------------------------------------------------------------------------------  Chemistries  Recent Labs  Lab 05/20/20 1219  NA 134*  K 3.5  CL 102  CO2 24  GLUCOSE 130*  BUN 36*  CREATININE 2.08*  CALCIUM 8.8*  AST 18  ALT 12  ALKPHOS 68  BILITOT 0.7    ------------------------------------------------------------------------------------------------------------------  Cardiac Enzymes No results for input(s): TROPONINI in the last 168 hours. ------------------------------------------------------------------------------------------------------------------  RADIOLOGY:  DG Chest Portable 1 View  Result Date: 05/20/2020 CLINICAL DATA:  Weakness EXAM: PORTABLE CHEST 1 VIEW COMPARISON:  05/04/2019 FINDINGS: Cardiomegaly. Mild, diffuse interstitial pulmonary opacity. The visualized skeletal structures are unremarkable. IMPRESSION: Mild, diffuse interstitial pulmonary opacity, likely edema in the setting of cardiomegaly. No focal airspace opacity. Electronically Signed   By: Lauralyn Primes M.D.   On: 05/20/2020 13:24      IMPRESSION AND PLAN:  1.  Acute metabolic encephalopathy likely due to hypoglycemic encephalopathy with type 2 diabetes mellitus and possibly sepsis. -The patient will be admitted to add progressive unit bed. -His Lantus will be held off and hypoglycemia protocol will be followed. -He will be placed on supplement coverage with NovoLog.  Metformin will be held off. -For suspected infected decubitus ulcers and wounds as a source for infection given his negative urinalysis and lack of pneumonia on chest x-ray, will continue him on 1 g of IV Rocephin for the possibility of nonpurulent moderate cellulitis. -Neuro checks every 4 hours for 24 hours. -Wound care consult to be obtained. -[facility] recommending admission and management here.  2.  Acute pulmonary edema like secondary to acute on chronic diastolic CHF with subsequent mild acute hypoxic respiratory failure. -His most recent 2D echo in 2018 revealed an EF of 70% and grade 1 diastolic dysfunction with mild mitral regurgitation, mild left atrial and right atrial dilatation and severe right ventricular dilatation. -The patient  will be diuresed with IV Lasix. -We will follow serial troponin I's. -We will obtain a 2D echo in a.m.  3.  Acute kidney injury superimposed on stage IIIb chronic kidney disease.  This is likely prerenal due to acute CHF. -The patient will be diuresed with IV Lasix and will follow his BMP.  4.  Hypertension. -Norvasc will be continued as well as hydralazine.  5.  Dyslipidemia. -Statin therapy will be resumed.  6.  Peripheral neuropathy. -Neurontin will be continued.  7.  Peripheral arterial disease. -We will continue aspirin and Plavix.  8.  DVT prophylaxis. -Subcutaneous Lovenox.   All the records are reviewed and case discussed with ED provider. The plan of care was discussed in details with the patient (and family). I answered all questions. The patient agreed to proceed with the above mentioned plan. Further management will depend upon hospital course.   CODE STATUS: Full code  Status is: Inpatient  Remains  inpatient appropriate because:Altered mental status, Ongoing diagnostic testing needed not appropriate for outpatient work up, Unsafe d/c plan, IV treatments appropriate due to intensity of illness or inability to take PO and Inpatient level of care appropriate due to severity of illness   Dispo: The patient is from: SNF              Anticipated d/c is to: SNF              Anticipated d/c date is: 2 days              Patient currently is not medically stable to d/c.  TOTAL TIME TAKING CARE OF THIS PATIENT: 55 minutes.    Christel Mormon M.D on 05/20/2020 at 8:08 PM  [facility]   From 7 PM-7 AM, contact night-coverage [email or web address]  CC: Primary care physician; Alvester Morin, MD   Note: This dictation was prepared with Dragon dictation along with smaller phrase technology. Any transcriptional errors that result from this process are unintentional.

## 2020-05-20 NOTE — ED Notes (Signed)
Pt has a stage 2 pressure wound that appears to be a skin tear in appearance, pink tissue noted and small amount of bright red blood noted when diaper pulled back. Area approx the size of walnut. Pt also has a wound to his left lower calf area that the patient states has a dressing change to it daily

## 2020-05-20 NOTE — ED Notes (Signed)
Assigned bed @ 2045, spoke with RN Rebekah.

## 2020-05-20 NOTE — ED Provider Notes (Signed)
[facility] Emergency Department Provider Note ____________________________________________   First MD Initiated Contact with Patient 05/20/20 1236     (approximate)  I have reviewed the triage vital signs and the nursing notes.   HISTORY  Chief Complaint Altered Mental Status  Level 5 caveat: History of present illness limited due to altered mental status  HPI Blake Villarreal is a 74 y.o. male with PMH as noted below who presents with decreased altered mental status, acute onset this morning, described as decreased alertness, and associated with hypoglycemia.  The patient's glucose was found to be 60, and he was given glucagon with improvement in the blood glucose but no significant change in his mental status.  The patient states that he has been feeling somewhat "out of it."  He denies other acute complaints.  Past Medical History:  Diagnosis Date  . Diabetes mellitus without complication (HCC)   . Hypertension   . Intermittent confusion   . PAD (peripheral artery disease) [facility])     Patient Active Problem List   Diagnosis Date Noted  . Healthcare-associated pneumonia   . DNR (do not resuscitate) discussion   . Palliative care encounter   . Pressure injury of skin 08/16/2017  . Acute encephalopathy   . Anemia 08/15/2017  . Acute respiratory failure [facility])     Past Surgical History:  Procedure Laterality Date  . ABOVE KNEE LEG AMPUTATION      Prior to Admission medications   Medication Sig Start Date End Date Taking? Authorizing Provider  amLODipine (NORVASC) 10 MG tablet Take 10 mg by mouth daily.   Yes [provider]  aspirin 81 MG chewable tablet Chew 81 mg by mouth daily.   Yes [provider]  atorvastatin (LIPITOR) 20 MG tablet Take 20 mg by mouth daily.   Yes [provider]  Cholecalciferol (VITAMIN D3) 2000 units TABS Take 1 tablet by mouth daily.   Yes [provider]  clopidogrel (PLAVIX) 75 MG  tablet Take 75 mg by mouth daily.   Yes [provider]  gabapentin (NEURONTIN) 100 MG capsule Take 100 mg by mouth 3 (three) times daily.   Yes [provider]  hydrALAZINE (APRESOLINE) 25 MG tablet Take 1 tablet (25 mg total) by mouth 3 (three) times daily. 08/22/17  Yes Delfino Lovett, MD  insulin glargine (LANTUS) 100 UNIT/ML injection Inject 5 Units into the skin at bedtime.    Yes [provider]  insulin regular (NOVOLIN R,HUMULIN R) 100 units/mL injection Inject 4-14 Units into the skin 3 (three) times daily before meals. 0-149 = 0 units, 150-200 = 4 units, 201-250 = 7 units, 251-300 = 9 units, 301-350 = 12 units, 351-400 = 14 units, call MD > 400.   Yes [provider]  lacosamide 100 MG TABS Take 1 tablet (100 mg total) by mouth 2 (two) times daily. 08/22/17  Yes Delfino Lovett, MD  lisinopril (PRINIVIL,ZESTRIL) 40 MG tablet Take 40 mg by mouth daily.   Yes [provider]  magnesium oxide (MAG-OX) 400 MG tablet Take 400 mg by mouth daily.    Yes [provider]  metFORMIN (GLUCOPHAGE) 500 MG tablet Take 500 mg by mouth 2 (two) times daily with a meal.   Yes [provider]  mirtazapine (REMERON) 7.5 MG tablet Take 7.5 mg by mouth at bedtime.   Yes [provider]  pantoprazole (PROTONIX) 40 MG tablet Take 1 tablet (40 mg total) by mouth 2 (two) times daily before  a meal. 08/22/17  Yes Delfino Lovett, MD  terazosin (HYTRIN) 5 MG capsule Take 5 mg by mouth at bedtime.   Yes [provider]  vitamin B-12 (CYANOCOBALAMIN) 1000 MCG tablet Take 1,000 mcg by mouth daily.   Yes [provider]  acetaminophen (TYLENOL) 325 MG tablet Take 650 mg by mouth every 6 (six) hours as needed.    [provider]  albuterol (PROVENTIL) (2.5 MG/3ML) 0.083% nebulizer solution Take 2 ampules by nebulization every 6 (six) hours as needed for wheezing.    [provider]  docusate sodium (COLACE) 100 MG capsule Take 100 mg  by mouth daily as needed.     [provider]  hydrocerin (EUCERIN) CREA Apply 1 application topically 2 (two) times daily.    [provider]  predniSONE (STERAPRED UNI-PAK 21 TAB) 10 MG (21) TBPK tablet Start 60 mg po daily, taper 10 mg daily until done Patient not taking: Reported on 05/20/2020 08/22/17   Delfino Lovett, MD    Allergies Daptomycin and Iodinated diagnostic agents  Family History  Problem Relation Age of Onset  . CAD Neg Hx     Social History Social History   Tobacco Use  . Smoking status: Current Every Day Smoker  . Smokeless tobacco: Never Used  Substance Use Topics  . Alcohol use: No    Comment: Drank heavily in the past per his cousin  . Drug use: No    Review of Systems Level 5 caveat: Review of systems limited due to altered mental status Constitutional: Positive for weakness. Cardiovascular: Denies chest pain. Respiratory: Denies shortness of breath. Gastrointestinal: No vomiting. Musculoskeletal: Negative for back pain. Skin: Negative for rash. Neurological: Negative for headache.   ____________________________________________   PHYSICAL EXAM:  VITAL SIGNS: ED Triage Vitals  Enc Vitals Group     BP 05/20/20 1230 (!) 166/56     Pulse Rate 05/20/20 1230 64     Resp 05/20/20 1230 12     Temp 05/20/20 1232 97.8 F (36.6 C)     Temp Source 05/20/20 1232 Oral     SpO2 05/20/20 1232 96 %     Weight 05/20/20 1235 179 lb 14.3 oz (81.6 kg)     Height 05/20/20 1235 5\' 9"  (1.753 m)     Head Circumference --      Peak Flow --      Pain Score 05/20/20 1235 0     Pain Loc --      Pain Edu? --      Excl. in GC? --     Constitutional: Somnolent but arousable to voice.  Tired and weak appearing but answering questions. Eyes: Conjunctivae are normal.  EOMI.  PERRLA. Head: Atraumatic. Nose: No congestion/rhinnorhea. Mouth/Throat: Mucous membranes are moist.   Neck: Normal range of motion.  Cardiovascular: Normal rate, regular  rhythm. Grossly normal heart sounds.  Good peripheral circulation. Respiratory: Normal respiratory effort.  No retractions. Lungs CTAB. Gastrointestinal: Soft and nontender. No distention.  Genitourinary: No flank tenderness. Musculoskeletal: Extremities warm and well perfused.  Neurologic:  Normal speech and language.  No facial droop.  Motor intact in all extremities. Skin:  Skin is warm and dry. No rash noted.  Sacral decubitus stage II ulcer with no surrounding erythema, induration, or abnormal warmth.  Chronic venous stasis changes to distal left lower extremity with some open superficial wounds but no erythema or purulent drainage. Psychiatric: Calm and cooperative.  ____________________________________________   LABS (all labs ordered are listed, but only  abnormal results are displayed)  Labs Reviewed  GLUCOSE, CAPILLARY - Abnormal; Notable for the following components:      Result Value   Glucose-Capillary 125 (*)    All other components within normal limits  COMPREHENSIVE METABOLIC PANEL - Abnormal; Notable for the following components:   Sodium 134 (*)    Glucose, Bld 130 (*)    BUN 36 (*)    Creatinine, Ser 2.08 (*)    Calcium 8.8 (*)    GFR calc non Af Amer 30 (*)    GFR calc Af Amer 35 (*)    All other components within normal limits  CBC WITH DIFFERENTIAL/PLATELET - Abnormal; Notable for the following components:   WBC 28.9 (*)    RBC 3.01 (*)    Hemoglobin 8.2 (*)    HCT 26.5 (*)    Platelets 481 (*)    Neutro Abs 23.7 (*)    Monocytes Absolute 1.5 (*)    Abs Immature Granulocytes 0.27 (*)    All other components within normal limits  URINALYSIS, COMPLETE (UACMP) WITH MICROSCOPIC - Abnormal; Notable for the following components:   Color, Urine YELLOW (*)    APPearance CLEAR (*)    All other components within normal limits  TROPONIN I (HIGH SENSITIVITY) - Abnormal; Notable for the following components:   Troponin I (High Sensitivity) 19 (*)    All other  components within normal limits  SARS CORONAVIRUS 2 BY RT PCR (HOSPITAL ORDER, [place] [facility])  LACTIC ACID, PLASMA  TROPONIN I (HIGH SENSITIVITY)   ____________________________________________  EKG  ED ECG REPORT I, Arta Silence, the attending physician, personally viewed and interpreted this ECG.  Date: 05/20/2020 EKG Time: 1223 Rate: 61 Rhythm: normal sinus rhythm QRS Axis: Left axis Intervals: normal ST/T Wave abnormalities: normal Narrative Interpretation: no evidence of acute ischemia  ____________________________________________  RADIOLOGY  CXR: Mild interstitial opacity diffusely, with no focal infiltrate.  ____________________________________________   PROCEDURES  Procedure(s) performed: No  Procedures  Critical Care performed: No ____________________________________________   INITIAL IMPRESSION / ASSESSMENT AND PLAN / ED COURSE  Pertinent labs & imaging results that were available during my care of the patient were reviewed by me and considered in my medical decision making (see chart for details).  74 year old male with PMH as noted above including diabetes, hypertension, peripheral arterial disease, and intermittent confusion presents with altered mental status noted today.  The patient has been more somnolent and lethargic than normal.  He appears sleepy but arousable, and was able to give some history.  He admits to feeling "out of it" but denies other focal complaints.  I reviewed the past medical records in [place].  He was most recently seen in the ED about a year ago for fever and shortness of breath, and previously in 2019 for paresthesias.  On exam currently, he appears somewhat sleepy and lethargic but is arousable to voice and able to answer questions and follow commands.  Neurologic exam is nonfocal.  He has sacral decubitus ulcers which appear superficial, and some superficial ulcerations on the left leg.  He does  report pain there and it is possible that there could be a soft tissue infection although given the venous stasis changes there is no obvious evidence of cellulitis.  Besides cellulitis, differential includes pneumonia, urinary tract infection, dehydration, electrolyte abnormality, other metabolic cause, or less likely cardiac etiology.  We will obtain lab work-up, chest x-ray, and reassess.  ----------------------------------------- 4:15 PM on 05/20/2020 -----------------------------------------  The  patient is much more alert although still appears somewhat weak and tired.  Lab work-up is notable for significant leukocytosis although the lactic acid is normal.  Chest x-ray and urinalysis are unremarkable.  I do suspect an acute infection although the source is unclear.  The patient continues to report pain to the left leg and feels like the wounds there are worse.  I ordered empiric antibiotics to cover for skin/soft tissue infection.  The patient will require admission; since he is a [name] patient I have initiated contact to the [name] for possible transfer.  I signed the patient out to the oncoming ED physician Dr. Darnelle Catalan.  ____________________________________________   FINAL CLINICAL IMPRESSION(S) / ED DIAGNOSES  Final diagnoses:  Altered mental status, unspecified altered mental status type      NEW MEDICATIONS STARTED DURING THIS VISIT:  New Prescriptions   No medications on file     Note:  This document was prepared using Dragon voice recognition software and may include unintentional dictation errors.    Dionne Bucy, MD 05/20/20 [phone]

## 2020-05-20 NOTE — ED Notes (Signed)
Pt cleansed of urine and cathed for clean sample with Genelle Bal, NT.

## 2020-05-20 NOTE — Progress Notes (Signed)
PHARMACY -  BRIEF ANTIBIOTIC NOTE   Pharmacy has received consult(s) for Vancomycin from an ED provider.  The patient's profile has been reviewed for ht/wt/allergies/indication/available labs.    One time order(s) placed for Vancomycin 2 gm IV X 1   Further antibiotics/pharmacy consults should be ordered by admitting physician if indicated.                       Thank you, Sybrina Laning D 05/20/2020  3:51 PM

## 2020-05-21 DIAGNOSIS — N189 Chronic kidney disease, unspecified: Secondary | ICD-10-CM

## 2020-05-21 DIAGNOSIS — T1490XA Injury, unspecified, initial encounter: Secondary | ICD-10-CM

## 2020-05-21 LAB — BASIC METABOLIC PANEL
Anion gap: 10 (ref 5–15)
BUN: 35 mg/dL — ABNORMAL HIGH (ref 8–23)
CO2: 23 mmol/L (ref 22–32)
Calcium: 8.8 mg/dL — ABNORMAL LOW (ref 8.9–10.3)
Chloride: 104 mmol/L (ref 98–111)
Creatinine, Ser: 1.95 mg/dL — ABNORMAL HIGH (ref 0.61–1.24)
GFR calc Af Amer: 38 mL/min — ABNORMAL LOW (ref >60)
GFR calc non Af Amer: 33 mL/min — ABNORMAL LOW (ref >60)
Glucose, Bld: 77 mg/dL (ref 70–99)
Potassium: 4 mmol/L (ref 3.5–5.1)
Sodium: 137 mmol/L (ref 135–145)

## 2020-05-21 LAB — CBC WITH DIFFERENTIAL/PLATELET
Abs Immature Granulocytes: 0.15 10*3/uL — ABNORMAL HIGH (ref 0.00–0.07)
Basophils Absolute: 0.1 10*3/uL (ref 0.0–0.1)
Basophils Relative: 1 %
Eosinophils Absolute: 0.3 10*3/uL (ref 0.0–0.5)
Eosinophils Relative: 2 %
HCT: 28.2 % — ABNORMAL LOW (ref 39.0–52.0)
Hemoglobin: 8.9 g/dL — ABNORMAL LOW (ref 13.0–17.0)
Immature Granulocytes: 1 %
Lymphocytes Relative: 17 %
Lymphs Abs: 3 10*3/uL (ref 0.7–4.0)
MCH: 27.7 pg (ref 26.0–34.0)
MCHC: 31.6 g/dL (ref 30.0–36.0)
MCV: 87.9 fL (ref 80.0–100.0)
Monocytes Absolute: 1.2 10*3/uL — ABNORMAL HIGH (ref 0.1–1.0)
Monocytes Relative: 7 %
Neutro Abs: 12.6 10*3/uL — ABNORMAL HIGH (ref 1.7–7.7)
Neutrophils Relative %: 72 %
Platelets: 462 10*3/uL — ABNORMAL HIGH (ref 150–400)
RBC: 3.21 MIL/uL — ABNORMAL LOW (ref 4.22–5.81)
RDW: 14.1 % (ref 11.5–15.5)
WBC: 17.6 10*3/uL — ABNORMAL HIGH (ref 4.0–10.5)
nRBC: 0 % (ref 0.0–0.2)

## 2020-05-21 LAB — GLUCOSE, CAPILLARY
Glucose-Capillary: 165 mg/dL — ABNORMAL HIGH (ref 70–99)
Glucose-Capillary: 195 mg/dL — ABNORMAL HIGH (ref 70–99)
Glucose-Capillary: 239 mg/dL — ABNORMAL HIGH (ref 70–99)
Glucose-Capillary: 54 mg/dL — ABNORMAL LOW (ref 70–99)
Glucose-Capillary: 91 mg/dL (ref 70–99)
Glucose-Capillary: 92 mg/dL (ref 70–99)

## 2020-05-21 LAB — HEMOGLOBIN A1C
Hgb A1c MFr Bld: 9.8 % — ABNORMAL HIGH (ref 4.8–5.6)
Mean Plasma Glucose: 234.56 mg/dL

## 2020-05-21 LAB — CREATININE, SERUM
Creatinine, Ser: 2.01 mg/dL — ABNORMAL HIGH (ref 0.61–1.24)
GFR calc Af Amer: 37 mL/min — ABNORMAL LOW (ref >60)
GFR calc non Af Amer: 32 mL/min — ABNORMAL LOW (ref >60)

## 2020-05-21 LAB — MRSA PCR SCREENING: MRSA by PCR: POSITIVE — AB

## 2020-05-21 LAB — TROPONIN I (HIGH SENSITIVITY)
Troponin I (High Sensitivity): 22 ng/L — ABNORMAL HIGH (ref <18)
Troponin I (High Sensitivity): 21 ng/L — ABNORMAL HIGH (ref <18)

## 2020-05-21 LAB — TSH: TSH: 1.238 u[IU]/mL (ref 0.350–4.500)

## 2020-05-21 LAB — BRAIN NATRIURETIC PEPTIDE: B Natriuretic Peptide: 106.1 pg/mL — ABNORMAL HIGH (ref 0.0–100.0)

## 2020-05-21 LAB — MAGNESIUM: Magnesium: 2.2 mg/dL (ref 1.7–2.4)

## 2020-05-21 MED ORDER — SODIUM CHLORIDE 0.9 % IV SOLN
1.0000 g | INTRAVENOUS | Status: DC
  Administered 2020-05-21 – 2020-05-24 (×4): 1 g via INTRAVENOUS
  Filled 2020-05-21 (×3): qty 1
  Filled 2020-05-21: qty 10
  Filled 2020-05-21: qty 1

## 2020-05-21 MED ORDER — CHLORHEXIDINE GLUCONATE CLOTH 2 % EX PADS
6.0000 | MEDICATED_PAD | Freq: Every day | CUTANEOUS | Status: DC
  Administered 2020-05-21 – 2020-05-25 (×4): 6 via TOPICAL

## 2020-05-21 MED ORDER — MUPIROCIN 2 % EX OINT
1.0000 "application " | TOPICAL_OINTMENT | Freq: Two times a day (BID) | CUTANEOUS | Status: DC
  Administered 2020-05-21 – 2020-05-25 (×6): 1 via NASAL
  Filled 2020-05-21: qty 22

## 2020-05-21 MED ORDER — GERHARDT'S BUTT CREAM
TOPICAL_CREAM | Freq: Three times a day (TID) | CUTANEOUS | Status: DC
  Administered 2020-05-22 (×2): 1 via TOPICAL
  Filled 2020-05-21: qty 1

## 2020-05-21 NOTE — Plan of Care (Signed)

## 2020-05-21 NOTE — Progress Notes (Signed)
PROGRESS NOTE    Blake Villarreal  ZOX:[id number] DOB: [birth year] DOA: 05/20/2020 PCP: Keane Police, MD   Brief Narrative:  HPI per Dr. Valente David on 05/20/2020 Blake Villarreal  is a 74 y.o. African-American male with a known history of type 2 diabetes mellitus, hypertension and peripheral artery disease, who presented to the emergency room with acute onset of altered mental status with decreased responsiveness and a blood glucose of 60 with improvement after being given IM glucagon however without initial significant improvement of his mental status.  During my interview he was much more alert and cooperative and oriented x3.  He has been having no significant cough or wheezing or dyspnea however in the ER he dropped his pulse 70-80 7-89% on room air and that went up to.  He does have left lower extremity edema with chronic wounds.  No fever or chills.  He denied any nausea or vomiting or abdominal pain.  No chest pain or palpitations.  Upon presentation to the emergency room, blood pressure was 166/56 and otherwise vital signs were within normal.  Labs revealed sodium of 134 potassium 3.5, BUN of 36 and creatinine of 2.08 compared to 34/1.78 last month.  High-sensitivity troponin I was 19.  CBC showed leukocytosis 28.9 with neutrophilia and anemia close to baseline as well as thrombocytosis. EKG showed normal sinus rhythm with rate of 61 with poor R wave progression and probable left atrial enlargement and left axis deviation.  Chest x-ray showed mild diffuse interstitial pulmonary opacity likely edema in the setting of cardiomegaly with no focal airspace opacity.  The patient was given IV Rocephin and vancomycin as well as 500 mill IV normal saline.  He will be admitted to a progressive unit bed for further evaluation and management.  **Interim History  Assessment & Plan:   Active Problems:   Acute CHF (HCC)  Acute metabolic encephalopathy likely due to hypoglycemic encephalopathy with  type 2 diabetes mellitus and possibly sepsis from Wound Infection -The patient will be admitted to add progressive unit bed. -His Lantus will be held off and hypoglycemia protocol will be followed.  Resume slowly and will have diabetes education coordinator evaluate in the morning -He will be placed on supplement coverage with NovoLog.  Metformin will be held off. -For suspected infected decubitus ulcers and wounds as a source for infection given his negative urinalysis and lack of pneumonia on chest x-ray, will continue him on 1 g of IV Rocephin for the possibility of nonpurulent moderate cellulitis. -WBC went from 28.9 is now 17.6 -C/w Neuro checks every 4 hours for 24 hours. -Wound care consult to be obtained. -[facility] and management here.  We will try to transfer the patient in the a.m. when social work is here -CBGs ranging from 92-239 now -His mentation is much improved -Continue with delirium precautions  Acute pulmonary edema like secondary to acute on chronic diastolic CHF with subsequent mild acute hypoxic respiratory failure. -His most recent 2D echo in 2018 revealed an EF of 70% and grade 1 diastolic dysfunction with mild mitral regurgitation, mild left atrial and right atrial dilatation and severe right ventricular dilatation. -The patient will be diuresed with IV Lasix. -We will follow serial troponin I's. -BNP was 100.66-patient's troponin I it was from 19 down to 17 is now 21 and relatively flat -We will obtain a 2D echo in a.m. and this is currently pending -Continue to monitor for signs  and symptoms of volume overload -Strict I's and O's, daily weights; he is currently -2.904 liters since admission -Repeat chest x-ray in a.m.  Acute kidney injury superimposed on stage IIIb chronic kidney disease.   -This is likely prerenal due to acute CHF. -The patient will be diuresed with IV Lasix  and will follow his BMP -Avoid further nephrotoxic medications, contrast dyes, hypotension and renally dose medications -Continue to monitor and trend renal function closely -BUN/creatinine went from 36/2.08 and is now 35/1.95 Repeat CMP in the a.m.  Wounds Pressure Injury 08/15/17 Stage II -  Partial thickness loss of dermis presenting as a shallow open ulcer with a red, pink wound bed without slough. (Active)  08/15/17 2334  Location: Buttocks  Location Orientation: Left  Staging: Stage II -  Partial thickness loss of dermis presenting as a shallow open ulcer with a red, pink wound bed without slough.  Wound Description (Comments):   Present on Admission:   -Continue with Gerhardt's Butt cream  Normocytic anemia/anemia of chronic kidney disease -Patient's hemoglobin/hematocrit went from 8.2/26.5 is now 8.9/20.2 -Check anemia panel in a.m. -Continue to monitor for signs and symptoms of bleeding; no overt bleeding noted -Repeat CBC in a.m.  Hypertension. -Patient's blood pressure is now 130/76 -Continue with amlodipine 10 mg p.o. daily, as well as furosemide 40 mg IV every 12, as well as zinc 25 mg p.o. 3 times daily -Monitor blood pressure protocol  Dyslipidemia. -Continue with Atorvastatin 20 mg p.o. nightly  Peripheral neuropathy. -Neurontin will be continued.  Peripheral Arterial Disease. -Continue aspirin Plavix  GERD -Continue with pantoprazole 40 mg p.o. twice daily  DVT prophylaxis: Enoxaparin 40 g subcu every 24 Code Status: FULL CODE Family Communication: Discussed with pOA over the phone [facility] Plan[facility]  Status is: Inpatient  Remains inpatient appropriate because:Unsafe d/c plan, IV treatments appropriate due to intensity of illness or inability to take PO and Inpatient level of care appropriate due to severity of illness   Dispo: The patient is from: SNF              Anticipated d/c is to: SNF               Anticipated d/c date is: 2 days              Patient currently is not medically stable to d/c.  Consultants:   None   Procedures:   Antimicrobials:  Anti-infectives (From admission, onward)   Start     Dose/Rate Route Frequency Ordered Stop   05/21/20 1800  cefTRIAXone (ROCEPHIN) 1 g in sodium chloride 0.9 % 100 mL IVPB     1 g 200 mL/hr over 30 Minutes Intravenous Every 24 hours 05/21/20 0036     05/20/20 1600  vancomycin (VANCOREADY) IVPB 2000 mg/400 mL     2,000 mg 200 mL/hr over 120 Minutes Intravenous  Once 05/20/20 1551 05/20/20 2103   05/20/20 1530  vancomycin (VANCOCIN) IVPB 1000 mg/200 mL premix  Status:  Discontinued     1,000 mg 200 mL/hr over 60 Minutes Intravenous  Once 05/20/20 1524 05/20/20 1550   05/20/20 1530  cefTRIAXone (ROCEPHIN) 2 g in sodium chloride 0.9 % 100 mL IVPB     2 g 200 mL/hr over 30 Minutes Intravenous  Once 05/20/20 1524 05/20/20 1843     Subjective: Seen and examined at bedside and he was much more awake. No CP or SOB. Denies any lightheadedness.  Does not know  how he ended up in the hospital.  No other concerns or complaints at this time.  Objective: Vitals:   05/21/20 0440 05/21/20 0748 05/21/20 1156 05/21/20 1541  BP: (!) 165/69 (!) 145/68 (!) 146/58 130/76  Pulse: (!) 52 (!) 53 60 62  Resp: 18 17 18 18   Temp: 98 F (36.7 C) 98.2 F (36.8 C) 98 F (36.7 C) 98.2 F (36.8 C)  TempSrc: Oral Oral Oral Oral  SpO2: 100% 100% 100% 100%  Weight: 78.1 kg     Height:        Intake/Output Summary (Last 24 hours) at 05/21/2020 1912 Last data filed at 05/21/2020 1844 Gross per 24 hour  Intake 646 ml  Output 3650 ml  Net -3004 ml   Filed Weights   05/20/20 1235 05/20/20 2339 05/21/20 0440  Weight: 81.6 kg 78.6 kg 78.1 kg   Examination: Physical Exam:  Constitutional: WN/WD overweight African-American male currently in no acute distress appears calm but he is slightly uncomfortable Eyes: Lids and conjunctivae normal, sclerae anicteric   ENMT: External Ears, Nose appear normal. Grossly normal hearing. Neck: Appears normal, supple, no cervical masses, normal ROM, no appreciable thyromegaly Respiratory: Diminished to auscultation bilaterally, no wheezing, rales, rhonchi or crackles. Normal respiratory effort and patient is not tachypenic. No accessory muscle use.  Unlabored breathing Cardiovascular: RRR, no murmurs / rubs / gallops. S1 and S2 auscultated.  1+ lower extremity edema left Abdomen: Soft, non-tender, distended secondary to slightly to body habitus. Bowel sounds positive.  GU: Deferred. Musculoskeletal: No clubbing / cyanosis of digits/nails.  Has a right AKA Skin: Has multiple wounds and pressure ulcers on his backside and left leg and they are wrapped.  He has dry scaling on his legs. No induration; Warm and dry.  Neurologic: CN 2-12 grossly intact with no focal deficits. Romberg sign and cerebellar reflexes not assessed.  Psychiatric: Normal judgment and insight. Alert and oriented x 3. Normal mood and appropriate affect.   Data Reviewed: I have personally reviewed following labs and imaging studies  CBC: Recent Labs  Lab 05/20/20 1219 05/21/20 0146  WBC 28.9* 17.6*  NEUTROABS 23.7* 12.6*  HGB 8.2* 8.9*  HCT 26.5* 28.2*  MCV 88.0 87.9  PLT 481* 462*   Basic Metabolic Panel: Recent Labs  Lab 05/20/20 1219 05/20/20 2347 05/21/20 0146  NA 134*  --  137  K 3.5  --  4.0  CL 102  --  104  CO2 24  --  23  GLUCOSE 130*  --  77  BUN 36*  --  35*  CREATININE 2.08* 2.01* 1.95*  CALCIUM 8.8*  --  8.8*  MG  --  2.2  --    GFR: Estimated Creatinine Clearance: 33.2 mL/min (A) (by C-G formula based on SCr of 1.95 mg/dL (H)). Liver Function Tests: Recent Labs  Lab 05/20/20 1219  AST 18  ALT 12  ALKPHOS 68  BILITOT 0.7  PROT 8.1  ALBUMIN 3.5   No results for input(s): LIPASE, AMYLASE in the last 168 hours. No results for input(s): AMMONIA in the last 168 hours. Coagulation Profile: No results for  input(s): INR, PROTIME in the last 168 hours. Cardiac Enzymes: No results for input(s): CKTOTAL, CKMB, CKMBINDEX, TROPONINI in the last 168 hours. BNP (last 3 results) No results for input(s): PROBNP in the last 8760 hours. HbA1C: Recent Labs    05/20/20 2347  HGBA1C 9.8*   CBG: Recent Labs  Lab 05/21/20 0028 05/21/20 0127 05/21/20 0750 05/21/20 1157 05/21/20  1648  GLUCAP 54* 92 91 239* 165*   Lipid Profile: No results for input(s): CHOL, HDL, LDLCALC, TRIG, CHOLHDL, LDLDIRECT in the last 72 hours. Thyroid Function Tests: Recent Labs    05/20/20 2347  TSH 1.238   Anemia Panel: No results for input(s): VITAMINB12, FOLATE, FERRITIN, TIBC, IRON, RETICCTPCT in the last 72 hours. Sepsis Labs: Recent Labs  Lab 05/20/20 1219  LATICACIDVEN 0.7    Recent Results (from the past 240 hour(s))  SARS Coronavirus 2 by RT PCR (hospital order, performed in [facility] hospital lab) Nasopharyngeal Nasopharyngeal Swab     Status: None   Collection Time: 05/20/20  3:10 PM   Specimen: Nasopharyngeal Swab  Result Value Ref Range Status   SARS Coronavirus 2 NEGATIVE NEGATIVE Final    Comment: (NOTE) SARS-CoV-2 target nucleic acids are NOT DETECTED. The SARS-CoV-2 RNA is generally detectable in upper and lower respiratory specimens during the acute phase of infection. The lowest concentration of SARS-CoV-2 viral copies this assay can detect is 250 copies / mL. A negative result does not preclude SARS-CoV-2 infection and should not be used as the sole basis for treatment or other patient management decisions.  A negative result may occur with improper specimen collection / handling, submission of specimen other than nasopharyngeal swab, presence of viral mutation(s) within the areas targeted by this assay, and inadequate number of viral copies (<250 copies / mL). A negative result must be combined with clinical observations, patient history, and epidemiological information. Fact Sheet  for Patients:   BoilerBrush.com.cy Fact Sheet for Healthcare Providers: https://pope.com/ This test is not yet approved or cleared  by the [place] FDA and has been authorized for detection and/or diagnosis of SARS-CoV-2 by FDA under an Emergency Use Authorization (EUA).  This EUA will remain in effect (meaning this test can be used) for the duration of the COVID-19 declaration under Section 564(b)(1) of the Act, 21 U.S.C. section 360bbb-3(b)(1), unless the authorization is terminated or revoked sooner. Performed at [facility], [address] [place], [place] [address]   MRSA PCR Screening     Status: Abnormal   Collection Time: 05/20/20 11:24 PM   Specimen: Nasal Mucosa; Nasopharyngeal  Result Value Ref Range Status   MRSA by PCR POSITIVE (A) NEGATIVE Final    Comment:        The GeneXpert MRSA Assay (FDA approved for NASAL specimens only), is one component of a comprehensive MRSA colonization surveillance program. It is not intended to diagnose MRSA infection nor to guide or monitor treatment for MRSA infections. RESULT CALLED TO, READ BACK BY AND VERIFIED WITH: MARSHA TURNER 05/21/20 AT 0153 HS Performed at [facility], [address] [place], [place] [address]   Culture, blood (routine x 2)     Status: None (Preliminary result)   Collection Time: 05/21/20  1:46 AM   Specimen: BLOOD  Result Value Ref Range Status   Specimen Description BLOOD RIGHT ASSIST CONTROL  Final   Special Requests   Final    BOTTLES DRAWN AEROBIC AND ANAEROBIC Blood Culture results may not be optimal due to an inadequate volume of blood received in culture bottles   Culture   Final    NO GROWTH < 12 HOURS Performed at [facility], [address] [place], [place] [address]    Report Status PENDING  Incomplete  Culture, blood (routine x 2)     Status: None (Preliminary result)   Collection Time: 05/21/20  1:56  AM  Specimen: BLOOD  Result Value Ref Range Status   Specimen Description BLOOD LEFT HAND  Final   Special Requests   Final    BOTTLES DRAWN AEROBIC ONLY Blood Culture adequate volume   Culture   Final    NO GROWTH < 12 HOURS Performed at [facility], [address], [place], [place] [address]    Report Status PENDING  Incomplete    RN Pressure Injury Documentation: Pressure Injury 08/15/17 Stage II -  Partial thickness loss of dermis presenting as a shallow open ulcer with a red, pink wound bed without slough. (Active)  08/15/17 2334  Location: Buttocks  Location Orientation: Left  Staging: Stage II -  Partial thickness loss of dermis presenting as a shallow open ulcer with a red, pink wound bed without slough.  Wound Description (Comments):   Present on Admission:      Estimated body mass index is 25.41 kg/m as calculated from the following:   Height as of this encounter: 5\' 9"  (1.753 m).   Weight as of this encounter: 78.1 kg.  Malnutrition Type:      Malnutrition Characteristics:      Nutrition Interventions:     Radiology Studies: DG Chest Portable 1 View  Result Date: 05/20/2020 CLINICAL DATA:  Weakness EXAM: PORTABLE CHEST 1 VIEW COMPARISON:  05/04/2019 FINDINGS: Cardiomegaly. Mild, diffuse interstitial pulmonary opacity. The visualized skeletal structures are unremarkable. IMPRESSION: Mild, diffuse interstitial pulmonary opacity, likely edema in the setting of cardiomegaly. No focal airspace opacity. Electronically Signed   By: Eddie Candle M.D.   On: 05/20/2020 13:24   Scheduled Meds:  amLODipine  10 mg Oral Daily   aspirin  81 mg Oral Daily   atorvastatin  20 mg Oral Daily   Chlorhexidine Gluconate Cloth  6 each Topical Q0600   cholecalciferol  1,000 Units Oral Daily   clopidogrel  75 mg Oral Daily   enoxaparin (LOVENOX) injection  40 mg Subcutaneous Q24H   furosemide  40 mg Intravenous Q12H   gabapentin  100 mg Oral BID    Gerhardt's butt cream   Topical TID   hydrALAZINE  25 mg Oral TID   insulin aspart  0-9 Units Subcutaneous TID PC & HS   lacosamide  100 mg Oral BID   magnesium oxide  400 mg Oral Daily   mirtazapine  7.5 mg Oral QHS   mupirocin ointment  1 application Nasal BID   pantoprazole  40 mg Oral BID AC   sodium chloride flush  3 mL Intravenous Q12H   terazosin  5 mg Oral QHS   vitamin B-12  1,000 mcg Oral Daily   Continuous Infusions:  sodium chloride     cefTRIAXone (ROCEPHIN)  IV 1 g (05/21/20 1807)    LOS: 1 day   Kerney Elbe, DO [facility] PAGER is on AMION  If 7PM-7AM, please contact night-coverage [email or web address]

## 2020-05-21 NOTE — Consult Note (Signed)
WOC Nurse Consult Note: Reason for Consult: Pressure injury (Stage 3) on right ischial tuberosity subsequent to RLE AKA and altered weight distribution. Also right inguinal MASD with skin erosion, urinary incontinence. 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 Wound type: Venous insufficiency, pressure, mositure Pressure Injury POA: Yes Measurement: Right ischial tuberosity measures 8cm x 6cm x 0.2cm and is pink, moist and free of necrotic tissue, moderate serous exudate. No odor. LLE with no visible breaks in integument, although patient says his leg "drains".  Hardened, dry skin. A healed pressure injury (full thickness) is noted on the medial heel. Right inguinal, scrotal areas with MASD, grey/white macerated tissue with small breaks in skin. Pink wound bed, serous exudate in a small amount Wound bed:As noted above Drainage (amount, consistency, odor) As noted above Periwound: Dry, intact. Dressing procedure/placement/frequency: I will provide the patient with a mattress replacement with low air loss feature to address the microclimate and provide pressure redistribution. He requires side to side positioning while in bed and offloading of the left heel with a pressure redistribution heel boot. We will provide Gerhart's Butt Cream, a 1:1:1 compounded product containing zinc oxide:hydrocortisone cream:lotrimin cream for the scrotum, inguinal, medial thighs and perineal areas.  Wound care to the right IT Stage 3 pressure injury will be with a silver hydrofiber (Aquacel Advantage) applied daily and covered with a silicone foam dressing. Patient wound benefit from a PT consult for input on a pressure redistribution chair cushion post amputation such as a Roho or Jay gel cushion. This is outside the scope of WOC Nursing. It may require consultation as an outpatient.  HHRN, inpatient at a Rehab or SNF may be required for skin and wound care post discharge.  IF you agree, please  consult Case Management.  WOC nursing team will not follow, but will remain available to this patient, the nursing and medical teams.  Please re-consult if needed. Thanks, Ladona Mow, MSN, RN, GNP, [facility] [place]  Pager# [phone]

## 2020-05-22 ENCOUNTER — Inpatient Hospital Stay
Admit: 2020-05-22 | Discharge: 2020-05-22 | Disposition: A | Discharge status: Home / Self Care | Payer: No Typology Code available for payment source | Attending: Family Medicine | Admitting: Family Medicine

## 2020-05-22 ENCOUNTER — Inpatient Hospital Stay: Payer: No Typology Code available for payment source

## 2020-05-22 LAB — CBC WITH DIFFERENTIAL/PLATELET
Abs Immature Granulocytes: 0.08 10*3/uL — ABNORMAL HIGH (ref 0.00–0.07)
Basophils Absolute: 0.1 10*3/uL (ref 0.0–0.1)
Basophils Relative: 1 %
Eosinophils Absolute: 0.4 10*3/uL (ref 0.0–0.5)
Eosinophils Relative: 3 %
HCT: 26.9 % — ABNORMAL LOW (ref 39.0–52.0)
Hemoglobin: 8.5 g/dL — ABNORMAL LOW (ref 13.0–17.0)
Immature Granulocytes: 1 %
Lymphocytes Relative: 24 %
Lymphs Abs: 2.8 10*3/uL (ref 0.7–4.0)
MCH: 27.4 pg (ref 26.0–34.0)
MCHC: 31.6 g/dL (ref 30.0–36.0)
MCV: 86.8 fL (ref 80.0–100.0)
Monocytes Absolute: 1 10*3/uL (ref 0.1–1.0)
Monocytes Relative: 9 %
Neutro Abs: 7.4 10*3/uL (ref 1.7–7.7)
Neutrophils Relative %: 62 %
Platelets: 459 10*3/uL — ABNORMAL HIGH (ref 150–400)
RBC: 3.1 MIL/uL — ABNORMAL LOW (ref 4.22–5.81)
RDW: 14.1 % (ref 11.5–15.5)
WBC: 11.8 10*3/uL — ABNORMAL HIGH (ref 4.0–10.5)
nRBC: 0 % (ref 0.0–0.2)

## 2020-05-22 LAB — COMPREHENSIVE METABOLIC PANEL
ALT: 12 U/L (ref 0–44)
AST: 14 U/L — ABNORMAL LOW (ref 15–41)
Albumin: 3 g/dL — ABNORMAL LOW (ref 3.5–5.0)
Alkaline Phosphatase: 74 U/L (ref 38–126)
Anion gap: 8 (ref 5–15)
BUN: 36 mg/dL — ABNORMAL HIGH (ref 8–23)
CO2: 26 mmol/L (ref 22–32)
Calcium: 8.5 mg/dL — ABNORMAL LOW (ref 8.9–10.3)
Chloride: 103 mmol/L (ref 98–111)
Creatinine, Ser: 1.94 mg/dL — ABNORMAL HIGH (ref 0.61–1.24)
GFR calc Af Amer: 38 mL/min — ABNORMAL LOW (ref >60)
GFR calc non Af Amer: 33 mL/min — ABNORMAL LOW (ref >60)
Glucose, Bld: 222 mg/dL — ABNORMAL HIGH (ref 70–99)
Potassium: 3.7 mmol/L (ref 3.5–5.1)
Sodium: 137 mmol/L (ref 135–145)
Total Bilirubin: 0.6 mg/dL (ref 0.3–1.2)
Total Protein: 7.2 g/dL (ref 6.5–8.1)

## 2020-05-22 LAB — ECHOCARDIOGRAM COMPLETE
Height: 69 in
Weight: 2787.2 oz

## 2020-05-22 LAB — FOLATE: Folate: 6.3 ng/mL (ref >5.9)

## 2020-05-22 LAB — RETICULOCYTES
Immature Retic Fract: 15.8 % (ref 2.3–15.9)
RBC.: 3.05 MIL/uL — ABNORMAL LOW (ref 4.22–5.81)
Retic Count, Absolute: 42.7 10*3/uL (ref 19.0–186.0)
Retic Ct Pct: 1.4 % (ref 0.4–3.1)

## 2020-05-22 LAB — FERRITIN: Ferritin: 46 ng/mL (ref 24–336)

## 2020-05-22 LAB — GLUCOSE, CAPILLARY
Glucose-Capillary: 207 mg/dL — ABNORMAL HIGH (ref 70–99)
Glucose-Capillary: 171 mg/dL — ABNORMAL HIGH (ref 70–99)
Glucose-Capillary: 164 mg/dL — ABNORMAL HIGH (ref 70–99)
Glucose-Capillary: 188 mg/dL — ABNORMAL HIGH (ref 70–99)

## 2020-05-22 LAB — MAGNESIUM: Magnesium: 1.9 mg/dL (ref 1.7–2.4)

## 2020-05-22 LAB — IRON AND TIBC
Iron: 25 ug/dL — ABNORMAL LOW (ref 45–182)
Saturation Ratios: 14 % — ABNORMAL LOW (ref 17.9–39.5)
TIBC: 181 ug/dL — ABNORMAL LOW (ref 250–450)
UIBC: 156 ug/dL

## 2020-05-22 LAB — PHOSPHORUS: Phosphorus: 4 mg/dL (ref 2.5–4.6)

## 2020-05-22 LAB — VITAMIN B12: Vitamin B-12: 2800 pg/mL — ABNORMAL HIGH (ref 180–914)

## 2020-05-22 MED ORDER — INSULIN GLARGINE 100 UNIT/ML ~~LOC~~ SOLN
3.0000 [IU] | Freq: Every day | SUBCUTANEOUS | Status: DC
  Administered 2020-05-22 – 2020-05-23 (×2): 3 [IU] via SUBCUTANEOUS
  Filled 2020-05-22 (×3): qty 0.03

## 2020-05-22 NOTE — Evaluation (Signed)
Physical Therapy Evaluation Patient Details Name: Blake Villarreal MRN: [id number] DOB: Jul 03, 1946 Today's Date: 05/22/2020   History of Present Illness  Blake Villarreal  is a 74 y.o. African-American male with a known history of type 2 diabetes mellitus, hypertension and peripheral artery disease, who presented to the emergency room with acute onset of altered mental status with decreased responsiveness and a blood glucose of 60; admitted for management of acute metabolic encephalopathy, acute pulmonary edema due to acute/chronic CHF  Clinical Impression  Patient received sitting edge of bed, just completed OT session. Maintains balance with mod indep; fair awareness of limits of stability.  Slightly frustrated/agitated with interview and history-taking at times, but easily redirectable.  Bilat UE strength and ROM grossly symmetrical and WFL; L LE with significant skin changes (and open areas, bandaged throughout session), lacking full knee extension; R AKA.  Currently requiring mod assist for bed mobility; mod indep for unsupported sitting.  Transfer/standing attempts deferred, as patient not interested, also suggests total/dep care for transfers at baseline.  Reports recommendations for minimal/no WBing L LE due to skin breakdown (per [facility] physicians).  Per discussion with LTC facility [facility]) to clarify and confirm baseline abilities, patient largely dep assist (via sit/stand lift) for all transfers and utilizes power/electric WC as primary mobility; receives assist from staff for ADLs, self-care as needed for the past 4 years. As such, patient appears to be at baseline level of functional ability without acute PT needs identified.  Will complete order at this time.  Please re-consult should needs change.    Follow Up Recommendations No PT follow up(return to LTC facility)    Equipment Recommendations       Recommendations for Other Services       Precautions / Restrictions  Precautions Precautions: Fall Restrictions Weight Bearing Restrictions: No RLE Weight Bearing: Non weight bearing Other Position/Activity Restrictions: Per patient, has been advised by Blake Villarreal to "not put weight through my L LE"      Mobility  Bed Mobility Overal bed mobility: Needs Assistance Bed Mobility: Supine to Sit;Sit to Supine     Supine to sit: Min assist;Mod assist Sit to supine: Mod assist;Max assist   General bed mobility comments: assist for L LE management  Transfers                 General transfer comment: patient declined, requesting to remain in bed after hygiene/linen change; per discussion with [facility], non-ambulatory at baseline  Ambulation/Gait             General Gait Details: non-ambulatory at baseline  Stairs            Wheelchair Mobility    Modified Rankin (Stroke Patients Only)       Balance Overall balance assessment: Needs assistance Sitting-balance support: No upper extremity supported;Feet supported Sitting balance-Blake Villarreal: Good                                       Pertinent Vitals/Pain Pain Assessment: Faces Faces Pain Villarreal: Hurts even more Pain Location: buttocks, L LE Pain Descriptors / Indicators: Aching;Guarding;Grimacing Pain Intervention(s): Limited activity within patient's tolerance;Monitored during session;Repositioned    Home Living Family/patient expects to be discharged to:: Skilled nursing facility                 Additional Comments: LTC resident at [facility] x4 years  Prior Function Level of Independence: Needs assistance   Gait / Transfers Assistance Needed: Assist for t/fs - pt reports type of t/s "depedns on circumstances"   ADL's / Homemaking Assistance Needed: Assist for I/ADLs - dressing, bathing, meals   Comments: Per discussion with Blake Villarreal, patient dep for OOB transfers (using sit/stand lift) and uses electric WC (indep negotiates) as primary  mobility.  Able to maintain unsupported sitting balance edge of bed indep. Receives assist from staff for ADLs, self care as needed.     Hand Dominance   Dominant Hand: Right    Extremity/Trunk Assessment   Upper Extremity Assessment Upper Extremity Assessment: Overall WFL for tasks assessed(grossly at least 4/5 throughout)    Lower Extremity Assessment Lower Extremity Assessment: (L LE with kerlix/wrapping knee distally (due to wounds), prevlon boot in place, L knee lacking approx 20-25 degrees extension; R AKA) RLE Deficits / Details: R AKA        Communication   Communication: HOH  Cognition Arousal/Alertness: Awake/alert Behavior During Therapy: WFL for tasks assessed/performed Overall Cognitive Status: Impaired/Different from baseline Area of Impairment: Orientation                 Orientation Level: Disoriented to;Situation             General Comments: oriented to self, location; intermittent confusion evident.  Easily frustrated with interview, but redirectable      General Comments      Exercises Other Exercises Other Exercises: Rolling bilat, min assist, for rotation and pressure relief; dep for hygiene and linen change after incontinent bladder episode   Assessment/Plan    PT Assessment Patent does not need any further PT services  PT Problem List         PT Treatment Interventions      PT Goals (Current goals can be found in the Care Plan section)  Acute Rehab PT Goals Patient Stated Goal: To get stronger PT Goal Formulation: All assessment and education complete, DC therapy    Frequency     Barriers to discharge        Co-evaluation               AM-PAC PT "6 Clicks" Mobility  Outcome Measure Help needed turning from your back to your side while in a flat bed without using bedrails?: A Little Help needed moving from lying on your back to sitting on the side of a flat bed without using bedrails?: A Lot Help needed moving to  and from a bed to a chair (including a wheelchair)?: Total Help needed standing up from a chair using your arms (e.g., wheelchair or bedside chair)?: Total Help needed to walk in hospital room?: Total Help needed climbing 3-5 steps with a railing? : Total 6 Click Score: 9    End of Session   Activity Tolerance: Patient tolerated treatment well Patient left: in bed;with call bell/phone within reach;with bed alarm set   PT Visit Diagnosis: Muscle weakness (generalized) (M62.81)    Time: 9528-4132 PT Time Calculation (min) (ACUTE ONLY): 21 min   Charges:   PT Evaluation $PT Eval Moderate Complexity: 1 Mod          Blake Villarreal H. Owens Shark, PT, DPT, NCS 05/22/20, 5:10 PM [phone]

## 2020-05-22 NOTE — NC FL2 (Signed)
[place] LEVEL OF CARE SCREENING TOOL     IDENTIFICATION  Patient Name: Blake Villarreal Birthdate: 1946/05/20 Sex: male Admission Date (Current Location): 05/20/2020  [facility] and [place] Number:  Engineering geologist and Address:  [facility], [address] [place], [place] [address]      Provider Number: [id number]  Attending Physician Name and Address:  Kerney Elbe, DO  Relative Name and Phone Number:       Current Level of Care: Hospital Recommended Level of Care: [place] Prior Approval Number:    Date Approved/Denied:   PASRR Number:    Discharge Plan: SNF    Current Diagnoses: Patient Active Problem List   Diagnosis Date Noted  . Acute CHF ([place]) 05/20/2020  . Healthcare-associated pneumonia   . DNR (do not resuscitate) discussion   . Palliative care encounter   . Pressure injury of skin 08/16/2017  . Acute encephalopathy   . Anemia 08/15/2017  . Acute respiratory failure (HCC)     Orientation RESPIRATION BLADDER Height & Weight     Self, Time, Place, Situation  O2([place] 2L) External catheter, Incontinent(placed 6/6) Weight: 174 lb 3.2 oz (79 kg) Height:  5\' 9"  (175.3 cm)  BEHAVIORAL SYMPTOMS/MOOD NEUROLOGICAL BOWEL NUTRITION STATUS      Incontinent Diet(carb modified thin liquids)  AMBULATORY STATUS COMMUNICATION OF NEEDS Skin   Total Care Verbally Normal                       Personal Care Assistance Level of Assistance  Bathing, Dressing, Feeding Bathing Assistance: Maximum assistance Feeding assistance: Maximum assistance Dressing Assistance: Maximum assistance     Functional Limitations Info  Sight, Hearing, Speech Sight Info: Adequate Hearing Info: Adequate Speech Info: Adequate    SPECIAL CARE FACTORS FREQUENCY  PT (By licensed PT), OT (By licensed OT)     PT Frequency: 5x OT Frequency: 5x            Contractures Contractures Info: Not present    Additional  Factors Info  Code Status, Allergies Code Status Info: Full Code Allergies Info: Daptomycin, Iodinated Diagnostic Agents           Current Medications (05/22/2020):  This is the current hospital active medication list Current Facility-Administered Medications  Medication Dose Route Frequency Provider Last Rate Last Admin  . 0.9 %  sodium chloride infusion  250 mL Intravenous PRN Mansy, Jan A, MD      . acetaminophen (TYLENOL) tablet 650 mg  650 mg Oral Q4H PRN Mansy, Jan A, MD   650 mg at 05/22/20 1007  . albuterol (PROVENTIL) (2.5 MG/3ML) 0.083% nebulizer solution 5 mg  5 mg Nebulization Q6H PRN Mansy, Jan A, MD      . ALPRAZolam Duanne Moron) tablet 0.25 mg  0.25 mg Oral BID PRN Mansy, Jan A, MD      . amLODipine (NORVASC) tablet 10 mg  10 mg Oral Daily Mansy, Jan A, MD   10 mg at 05/22/20 0958  . aspirin chewable tablet 81 mg  81 mg Oral Daily Mansy, Jan A, MD   81 mg at 05/22/20 0959  . atorvastatin (LIPITOR) tablet 20 mg  20 mg Oral Daily Mansy, Jan A, MD   20 mg at 05/21/20 1807  . cefTRIAXone (ROCEPHIN) 1 g in sodium chloride 0.9 % 100 mL IVPB  1 g Intravenous Q24H Mansy, Jan A, MD 200 mL/hr at 05/21/20 1807 1 g at 05/21/20 1807  .  Chlorhexidine Gluconate Cloth 2 % PADS 6 each  6 each Topical Q0600 Mansy, Vernetta Honey, MD   6 each at 05/21/20 [phone]  . cholecalciferol (VITAMIN D3) tablet 1,000 Units  1,000 Units Oral Daily Mansy, Vernetta Honey, MD   1,000 Units at 05/22/20 0959  . clopidogrel (PLAVIX) tablet 75 mg  75 mg Oral Daily Mansy, Jan A, MD   75 mg at 05/22/20 0959  . docusate sodium (COLACE) capsule 100 mg  100 mg Oral Daily PRN Mansy, Jan A, MD      . enoxaparin (LOVENOX) injection 40 mg  40 mg Subcutaneous Q24H Mansy, Jan A, MD   40 mg at 05/21/20 2041  . furosemide (LASIX) injection 40 mg  40 mg Intravenous Q12H Mansy, Jan A, MD   40 mg at 05/22/20 0958  . gabapentin (NEURONTIN) capsule 100 mg  100 mg Oral BID Mansy, Jan A, MD   100 mg at 05/22/20 0959  . Gerhardt's butt cream   Topical TID  Marguerita Merles [place], DO   1 application at 05/22/20 6629  . hydrALAZINE (APRESOLINE) tablet 25 mg  25 mg Oral TID Mansy, Jan A, MD   25 mg at 05/22/20 0959  . insulin aspart (novoLOG) injection 0-9 Units  0-9 Units Subcutaneous TID PC & HS Mansy, Vernetta Honey, MD   2 Units at 05/22/20 1232  . lacosamide (VIMPAT) tablet 100 mg  100 mg Oral BID Mansy, Jan A, MD   100 mg at 05/22/20 0959  . magnesium oxide (MAG-OX) tablet 400 mg  400 mg Oral Daily Mansy, Jan A, MD   400 mg at 05/22/20 0958  . mirtazapine (REMERON) tablet 7.5 mg  7.5 mg Oral QHS Mansy, Jan A, MD   7.5 mg at 05/21/20 2042  . mupirocin ointment (BACTROBAN) 2 % 1 application  1 application Nasal BID Mansy, Vernetta Honey, MD   1 application at 05/21/20 [phone]  . ondansetron (ZOFRAN) injection 4 mg  4 mg Intravenous Q6H PRN Mansy, Jan A, MD      . pantoprazole (PROTONIX) EC tablet 40 mg  40 mg Oral BID AC Mansy, Jan A, MD   40 mg at 05/22/20 0959  . sodium chloride flush (NS) 0.9 % injection 3 mL  3 mL Intravenous Q12H Mansy, Jan A, MD   3 mL at 05/22/20 1000  . sodium chloride flush (NS) 0.9 % injection 3 mL  3 mL Intravenous PRN Mansy, Jan A, MD      . terazosin (HYTRIN) capsule 5 mg  5 mg Oral QHS Mansy, Jan A, MD   5 mg at 05/21/20 2042  . vitamin B-12 (CYANOCOBALAMIN) tablet 1,000 mcg  1,000 mcg Oral Daily Mansy, Jan A, MD   1,000 mcg at 05/22/20 4650     Discharge Medications: Please see discharge summary for a list of discharge medications.  Relevant Imaging Results:  Relevant Lab Results:   Additional Information SSN: [id number]  Maree Krabbe, LCSW

## 2020-05-22 NOTE — Progress Notes (Signed)
*  PRELIMINARY RESULTS* Echocardiogram 2D Echocardiogram has been performed.  Cristela Blue 05/22/2020, 11:10 AM

## 2020-05-22 NOTE — Progress Notes (Signed)
Inpatient Diabetes Program Recommendations  AACE/ADA: New Consensus Statement on Inpatient Glycemic Control (2015)  Target Ranges:  Prepandial:   less than 140 mg/dL      Peak postprandial:   less than 180 mg/dL (1-2 hours)      Critically ill patients:  140 - 180 mg/dL   Results for Blake Villarreal, Blake Villarreal (MRN [id number]) as of 05/22/2020 09:56  Ref. Range 05/20/2020 12:12 05/21/2020 00:28 05/21/2020 01:27 05/21/2020 07:50 05/21/2020 11:57 05/21/2020 16:48 05/21/2020 21:16  Glucose-Capillary Latest Ref Range: 70 - 99 mg/dL 193 (H) 54 (L) 92 91 790 (H)  3 units NOVOLOG  165 (H)  2 units NOVOLOG  195 (H)   Results for [name], [name] (MRN [id number]) as of 05/22/2020 09:56  Ref. 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  CBG 207 this AM.  May consider starting Lantus 3 units QHS (portion of home dose)     --Will follow patient during hospitalization--  Ambrose Finland RN, MSN, CDE Diabetes Coordinator Inpatient Glycemic Control Team Team Pager: [phone] (8a-5p)

## 2020-05-22 NOTE — Progress Notes (Signed)
PROGRESS NOTE    Blake Villarreal  MIW:[id number] DOB: [birth year] DOA: 05/20/2020 PCP: Keane Police, MD   Brief Narrative:  HPI per Dr. Valente David on 05/20/2020 Blake Villarreal  is a 74 y.o. African-American male with a known history of type 2 diabetes mellitus, hypertension and peripheral artery disease, who presented to the emergency room with acute onset of altered mental status with decreased responsiveness and a blood glucose of 60 with improvement after being given IM glucagon however without initial significant improvement of his mental status.  During my interview he was much more alert and cooperative and oriented x3.  He has been having no significant cough or wheezing or dyspnea however in the ER he dropped his pulse 70-80 7-89% on room air and that went up to.  He does have left lower extremity edema with chronic wounds.  No fever or chills.  He denied any nausea or vomiting or abdominal pain.  No chest pain or palpitations.  Upon presentation to the emergency room, blood pressure was 166/56 and otherwise vital signs were within normal.  Labs revealed sodium of 134 potassium 3.5, BUN of 36 and creatinine of 2.08 compared to 34/1.78 last month.  High-sensitivity troponin I was 19.  CBC showed leukocytosis 28.9 with neutrophilia and anemia close to baseline as well as thrombocytosis. EKG showed normal sinus rhythm with rate of 61 with poor R wave progression and probable left atrial enlargement and left axis deviation.  Chest x-ray showed mild diffuse interstitial pulmonary opacity likely edema in the setting of cardiomegaly with no focal airspace opacity.  The patient was given IV Rocephin and vancomycin as well as 500 mill IV normal saline.  He will be admitted to a progressive unit bed for further evaluation and management.  **Interim History Attempt was made to transfer the patient to the [facility] at the [facility] request however patient will likely not be in the hospital more than  48 hours as he is clinically improved.  His encephalopathy is improved significantly.  He likely does have a wound infection we will continue the ceftriaxone for now.  We will resume his Lantus dosing of 3 units.  Echocardiogram done is pending read.  Assessment & Plan:   Active Problems:   Acute CHF (HCC)  Acute metabolic encephalopathy likely due to hypoglycemic encephalopathy with type 2 diabetes mellitus and possibly sepsis from Wound Infection -The patient will be admitted to add progressive unit bed. -Initially his Lantus will be held off and hypoglycemia protocol will be followed.  Resume slowly and will have diabetes education coordinator evaluate in the morning and we will resume Lantus 3 units nightly -He will be placed on supplement coverage with NovoLog.  Metformin will be held off. -For suspected infected decubitus ulcers and wounds as a source for infection given his negative urinalysis and lack of pneumonia on chest x-ray, will continue him on 1 g of IV Rocephin for the possibility of nonpurulent moderate cellulitis and this seems to improve his white count -WBC went from 28.9 is now 1.8 -C/w Neuro checks every 4 hours for 24 hours. -Wound care consult to be obtained. -[facility] and management here on admission and attempts were made to transfer the patient back to the [name] however he likely be discharged within 48 hours so they do not accept transfer.  We will try to transfer the patient in the a.m. when social work is here -CBGs ranging from  161-096 -His mentation is much improved and seems to be at baseline -Continue antibiotics for his wound infection-continue monitor blood sugars carefully -Continue with delirium precautions  Acute pulmonary edema like secondary to acute on chronic diastolic CHF with subsequent mild acute hypoxic respiratory failure. -His most recent 2D echo in 2018 revealed an EF of  70% and grade 1 diastolic dysfunction with mild mitral regurgitation, mild left atrial and right atrial dilatation and severe right ventricular dilatation. -The patient will be diuresed with IV Lasix IV 40 mg and will continue. -We will follow serial troponin I's. -BNP was 100.6 -Patient's troponin I it was from 19 down to 17 is now 21 and relatively flat -We will obtain a 2D echo in a.m. and this is currently pending to be read as it is done today -Continue to monitor for signs and symptoms of volume overload -Strict I's and O's, daily weights; he is currently - 5.511 liters since admission -Repeat chest x-ray in a.m showed "Improving volumes and aeration of the lungs with diminishing opacities likely reflecting resolving atelectasis and/or improving edema." -SpO2: 100 % O2 Flow Rate (L/min): 2 L/min -Continuous supplemental oxygen via nasal cannula wean O2 as tolerated -Continuous pulse oximetry maintain O2 saturation greater than 92% -We will need an oxygen ambulatory home O2 screen with PT  Acute kidney injury superimposed on stage IIIb chronic kidney disease.   -This is likely prerenal due to acute CHF. -The patient will be diuresed with IV Lasix and will follow his BMP -Avoid further nephrotoxic medications, contrast dyes, hypotension and renally dose medications -Continue to monitor and trend renal function closely -BUN/creatinine went from 36/2.08 and is now 36 1.94 Repeat CMP in the a.m.  Wounds Pressure Injury 08/15/17 Stage II -  Partial thickness loss of dermis presenting as a shallow open ulcer with a red, pink wound bed without slough. (Active)  08/15/17 2334  Location: Buttocks  Location Orientation: Left  Staging: Stage II -  Partial thickness loss of dermis presenting as a shallow open ulcer with a red, pink wound bed without slough.  Wound Description (Comments):   Present on Admission:   -Continue with Gerhardt's Butt cream -With antibiotics and likely can switch  to p.o. -We will need wound care to be continued in the outpatient setting  Normocytic anemia/anemia of chronic kidney disease -Patient's hemoglobin/hematocrit is relatively stable is now 8.5/26.9 -Check anemia panel in a.m. and is now done.  Iron level is 25, U IBC 156, TIBC is 181, saturation ratios was 14%, ferritin level is 46, flow 6.3 and B12 was 2800 -Continue to monitor for signs and symptoms of bleeding; no overt bleeding noted -Repeat CBC in a.m.  Hypertension. -Patient's blood pressure is now 127/52 -Continue with amlodipine 10 mg p.o. daily, as well as furosemide 40 mg IV every 12, as well as zinc 25 mg p.o. 3 times daily -Monitor blood pressure protocol  Dyslipidemia. -Continue with Atorvastatin 20 mg p.o. nightly  Peripheral neuropathy. -Neurontin will be continued.  Peripheral Arterial Disease. -Continue aspirin Plavix  GERD -Continue with pantoprazole 40 mg p.o. twice daily  DVT prophylaxis: Enoxaparin 40 g subcu every 24 Code Status: FULL CODE Family Communication: Discussed with pOA over the phone yesterday Disposition Plan: Attempted to transfer to the [name] today however unsuccessful as likely can be discharged in next 48 hours given that he is improving next-we will continue IV diuresis and if he is stable can likely change to p.o. and can also change to p.o. ceftriaxone.  Will need further PT OT evaluations however he is from a [facility]  Status is: Inpatient  Remains inpatient appropriate because:Unsafe d/c plan, IV treatments appropriate due to intensity of illness or inability to take PO and Inpatient level of care appropriate due to severity of illness   Dispo: The patient is from: SNF              Anticipated d/c is to: SNF              Anticipated d/c date is: 1-2 days              Patient currently is not medically stable to d/c.  Consultants:   None   Procedures: Echocardiogram was done and pending read  Antimicrobials:   Anti-infectives (From admission, onward)   Start     Dose/Rate Route Frequency Ordered Stop   05/21/20 1800  cefTRIAXone (ROCEPHIN) 1 g in sodium chloride 0.9 % 100 mL IVPB     1 g 200 mL/hr over 30 Minutes Intravenous Every 24 hours 05/21/20 0036     05/20/20 1600  vancomycin (VANCOREADY) IVPB 2000 mg/400 mL     2,000 mg 200 mL/hr over 120 Minutes Intravenous  Once 05/20/20 1551 05/20/20 2103   05/20/20 1530  vancomycin (VANCOCIN) IVPB 1000 mg/200 mL premix  Status:  Discontinued     1,000 mg 200 mL/hr over 60 Minutes Intravenous  Once 05/20/20 1524 05/20/20 1550   05/20/20 1530  cefTRIAXone (ROCEPHIN) 2 g in sodium chloride 0.9 % 100 mL IVPB     2 g 200 mL/hr over 30 Minutes Intravenous  Once 05/20/20 1524 05/20/20 1843     Subjective: Seen and examined at bedside and he feels much better today.  Denies any lightheadedness or dizziness.  No nausea or vomiting.  Feels well and denies any shortness of breath.  No other concerns or plans at this time   Objective: Vitals:   05/22/20 0416 05/22/20 0741 05/22/20 1200 05/22/20 1606  BP: (!) 136/59 (!) 154/62 (!) 134/54 (!) 127/52  Pulse: 65 64 (!) 56 62  Resp: 20 18 17 17   Temp: 98.5 F (36.9 C) 98 F (36.7 C) 98.4 F (36.9 C) 98 F (36.7 C)  TempSrc: Oral Oral Oral Oral  SpO2: 100% 100% 100% 100%  Weight: 79 kg     Height:        Intake/Output Summary (Last 24 hours) at 05/22/2020 1725 Last data filed at 05/22/2020 1610 Gross per 24 hour  Intake 343 ml  Output 3550 ml  Net -3207 ml   Filed Weights   05/20/20 2339 05/21/20 0440 05/22/20 0416  Weight: 78.6 kg 78.1 kg 79 kg   Examination: Physical Exam:  Constitutional: WN/WD overweight African-American male currently in no acute distress he seems more comfortable and is calm today. Eyes: Lids and conjunctivae normal, sclerae anicteric  ENMT: External Ears, Nose appear normal. Grossly normal hearing.  Neck: Appears normal, supple, no cervical masses, normal ROM, no  appreciable thyromegaly; no JVD Respiratory: Diminished to auscultation bilaterally with coarse breath sounds and some mild crackles bilaterally;, no wheezing, rales, rhonchi. Normal respiratory effort and patient is not tachypenic. No accessory muscle use.  Wearing supplemental oxygen via nasal cannula Cardiovascular: RRR, no murmurs / rubs / gallops. S1 and S2 auscultated.  No appreciable lower extremity edema Abdomen: Soft, non-tender, distended secondary body habitus.  Bowel sounds positive.  GU: Deferred. Musculoskeletal: No clubbing / cyanosis of digits/nails.  Has a right AKA Skin:  Has a sacral ulcer and some lesions on the left leg.. No induration; Warm and dry.  Neurologic: CN 2-12 grossly intact with no focal deficits.  Romberg sign and cerebellar reflexes not assessed.  Psychiatric: Normal judgment and insight. Alert and oriented x 3. Normal mood and appropriate affect.   Data Reviewed: I have personally reviewed following labs and imaging studies  CBC: Recent Labs  Lab 05/20/20 1219 05/21/20 0146 05/22/20 0339  WBC 28.9* 17.6* 11.8*  NEUTROABS 23.7* 12.6* 7.4  HGB 8.2* 8.9* 8.5*  HCT 26.5* 28.2* 26.9*  MCV 88.0 87.9 86.8  PLT 481* 462* 459*   Basic Metabolic Panel: Recent Labs  Lab 05/20/20 1219 05/20/20 2347 05/21/20 0146 05/22/20 0339  NA 134*  --  137 137  K 3.5  --  4.0 3.7  CL 102  --  104 103  CO2 24  --  23 26  GLUCOSE 130*  --  77 222*  BUN 36*  --  35* 36*  CREATININE 2.08* 2.01* 1.95* 1.94*  CALCIUM 8.8*  --  8.8* 8.5*  MG  --  2.2  --  1.9  PHOS  --   --   --  4.0   GFR: Estimated Creatinine Clearance: 33.4 mL/min (A) (by C-G formula based on SCr of 1.94 mg/dL (H)). Liver Function Tests: Recent Labs  Lab 05/20/20 1219 05/22/20 0339  AST 18 14*  ALT 12 12  ALKPHOS 68 74  BILITOT 0.7 0.6  PROT 8.1 7.2  ALBUMIN 3.5 3.0*   No results for input(s): LIPASE, AMYLASE in the last 168 hours. No results for input(s): AMMONIA in the last 168  hours. Coagulation Profile: No results for input(s): INR, PROTIME in the last 168 hours. Cardiac Enzymes: No results for input(s): CKTOTAL, CKMB, CKMBINDEX, TROPONINI in the last 168 hours. BNP (last 3 results) No results for input(s): PROBNP in the last 8760 hours. HbA1C: Recent Labs    05/20/20 2347  HGBA1C 9.8*   CBG: Recent Labs  Lab 05/21/20 1648 05/21/20 2116 05/22/20 0742 05/22/20 1201 05/22/20 1650  GLUCAP 165* 195* 207* 171* 164*   Lipid Profile: No results for input(s): CHOL, HDL, LDLCALC, TRIG, CHOLHDL, LDLDIRECT in the last 72 hours. Thyroid Function Tests: Recent Labs    05/20/20 2347  TSH 1.238   Anemia Panel: Recent Labs    05/22/20 0339  VITAMINB12 2,800*  FOLATE 6.3  FERRITIN 46  TIBC 181*  IRON 25*  RETICCTPCT 1.4   Sepsis Labs: Recent Labs  Lab 05/20/20 1219  LATICACIDVEN 0.7    Recent Results (from the past 240 hour(s))  SARS Coronavirus 2 by RT PCR (hospital order, performed in [facility] hospital lab) Nasopharyngeal Nasopharyngeal Swab     Status: None   Collection Time: 05/20/20  3:10 PM   Specimen: Nasopharyngeal Swab  Result Value Ref Range Status   SARS Coronavirus 2 NEGATIVE NEGATIVE Final    Comment: (NOTE) SARS-CoV-2 target nucleic acids are NOT DETECTED. The SARS-CoV-2 RNA is generally detectable in upper and lower respiratory specimens during the acute phase of infection. The lowest concentration of SARS-CoV-2 viral copies this assay can detect is 250 copies / mL. A negative result does not preclude SARS-CoV-2 infection and should not be used as the sole basis for treatment or other patient management decisions.  A negative result may occur with improper specimen collection / handling, submission of specimen other than nasopharyngeal swab, presence of viral mutation(s) within the areas targeted by this assay, and inadequate number of  viral copies (<250 copies / mL). A negative result must be combined with  clinical observations, patient history, and epidemiological information. Fact Sheet for Patients:   BoilerBrush.com.cyhttps://www.fda.gov/media/136312/download Fact Sheet for Healthcare Providers: https://pope.com/https://www.fda.gov/media/136313/download This test is not yet approved or cleared  by the [place] FDA and has been authorized for detection and/or diagnosis of SARS-CoV-2 by FDA under an Emergency Use Authorization (EUA).  This EUA will remain in effect (meaning this test can be used) for the duration of the COVID-19 declaration under Section 564(b)(1) of the Act, 21 U.S.C. section 360bbb-3(b)(1), unless the authorization is terminated or revoked sooner. Performed at [facility], [address] [place], [place] [address]   MRSA PCR Screening     Status: Abnormal   Collection Time: 05/20/20 11:24 PM   Specimen: Nasal Mucosa; Nasopharyngeal  Result Value Ref Range Status   MRSA by PCR POSITIVE (A) NEGATIVE Final    Comment:        The GeneXpert MRSA Assay (FDA approved for NASAL specimens only), is one component of a comprehensive MRSA colonization surveillance program. It is not intended to diagnose MRSA infection nor to guide or monitor treatment for MRSA infections. RESULT CALLED TO, READ BACK BY AND VERIFIED WITH: MARSHA TURNER 05/21/20 AT 0153 HS Performed at [facility], [address] [place], [place] [address]   Culture, blood (routine x 2)     Status: None (Preliminary result)   Collection Time: 05/21/20  1:46 AM   Specimen: BLOOD  Result Value Ref Range Status   Specimen Description BLOOD RIGHT ASSIST CONTROL  Final   Special Requests   Final    BOTTLES DRAWN AEROBIC AND ANAEROBIC Blood Culture results may not be optimal due to an inadequate volume of blood received in culture bottles   Culture   Final    NO GROWTH 1 DAY Performed at [facility], [address] [place], [place] [address]    Report Status PENDING  Incomplete  Culture, blood  (routine x 2)     Status: None (Preliminary result)   Collection Time: 05/21/20  1:56 AM   Specimen: BLOOD  Result Value Ref Range Status   Specimen Description BLOOD LEFT HAND  Final   Special Requests   Final    BOTTLES DRAWN AEROBIC ONLY Blood Culture adequate volume   Culture   Final    NO GROWTH 1 DAY Performed at [facility], [address] [place], [place] [address]    Report Status PENDING  Incomplete    RN Pressure Injury Documentation: Pressure Injury 08/15/17 Stage II -  Partial thickness loss of dermis presenting as a shallow open ulcer with a red, pink wound bed without slough. (Active)  08/15/17 2334  Location: Buttocks  Location Orientation: Left  Staging: Stage II -  Partial thickness loss of dermis presenting as a shallow open ulcer with a red, pink wound bed without slough.  Wound Description (Comments):   Present on Admission:      Estimated body mass index is 25.72 kg/m as calculated from the following:   Height as of this encounter: 5\' 9"  (1.753 m).   Weight as of this encounter: 79 kg.  Malnutrition Type:      Malnutrition Characteristics:      Nutrition Interventions:     Radiology Studies: DG Chest 1 View  Result Date: 05/22/2020 CLINICAL DATA:  Shortness of breath EXAM: CHEST  1 VIEW COMPARISON:  Radiograph 05/20/2020, CT 08/18/2008 FINDINGS: Improving volumes and aeration  of the lungs with diminishing opacities. Stable cardiomegaly with a calcified aorta. No visible pneumothorax or effusion. Abundant mediastinal fat similar to comparison CT. No acute osseous or soft tissue abnormality. Degenerative changes are present in the imaged spine and shoulders. Telemetry leads overlie the chest. IMPRESSION: Improving volumes and aeration of the lungs with diminishing opacities likely reflecting resolving atelectasis and/or improving edema. Electronically Signed   By: Kreg Shropshire M.D.   On: 05/22/2020 05:49   Scheduled Meds: . amLODipine  10  mg Oral Daily  . aspirin  81 mg Oral Daily  . atorvastatin  20 mg Oral Daily  . Chlorhexidine Gluconate Cloth  6 each Topical Q0600  . cholecalciferol  1,000 Units Oral Daily  . clopidogrel  75 mg Oral Daily  . enoxaparin (LOVENOX) injection  40 mg Subcutaneous Q24H  . furosemide  40 mg Intravenous Q12H  . gabapentin  100 mg Oral BID  . Gerhardt's butt cream   Topical TID  . hydrALAZINE  25 mg Oral TID  . insulin aspart  0-9 Units Subcutaneous TID PC & HS  . insulin glargine  3 Units Subcutaneous QHS  . lacosamide  100 mg Oral BID  . magnesium oxide  400 mg Oral Daily  . mirtazapine  7.5 mg Oral QHS  . mupirocin ointment  1 application Nasal BID  . pantoprazole  40 mg Oral BID AC  . sodium chloride flush  3 mL Intravenous Q12H  . terazosin  5 mg Oral QHS  . vitamin B-12  1,000 mcg Oral Daily   Continuous Infusions: . sodium chloride    . cefTRIAXone (ROCEPHIN)  IV 1 g (05/22/20 1718)    LOS: 2 days   Merlene Laughter, DO [facility] PAGER is on AMION  If 7PM-7AM, please contact night-coverage [email or web address]

## 2020-05-22 NOTE — TOC Initial Note (Signed)
Transition of Care [facility]) - Initial/Assessment Note    Patient Details  Name: Blake Villarreal MRN: [id number] Date of Birth: 1946/05/15  [facility] CM/SW Contact:    Maree Krabbe, LCSW Phone Number: 05/22/2020, 3:59 PM  Clinical Narrative:   Pt is a LTC resident at [facility]. CSW called POA, [name] to confirm pt d/c back to facility. POA is agreeable. [facility]                Expected Discharge Plan: Skilled Nursing Facility Barriers to Discharge: Continued Medical Work up   Patient Goals and CMS Choice        Expected Discharge Plan and Services Expected Discharge Plan: Skilled Nursing Facility In-house Referral: Clinical Social Work   Post Acute Care Choice: Skilled Nursing Facility Living arrangements for the past 2 months: Skilled Nursing Facility                                      Prior Living Arrangements/Services Living arrangements for the past 2 months: Skilled Nursing Facility Lives with:: Self Patient language and need for interpreter reviewed:: Yes        Need for Family Participation in Patient Care: Yes (Comment) Care giver support system in place?: Yes (comment)   Criminal Activity/Legal Involvement Pertinent to Current Situation/Hospitalization: No - Comment as needed  Activities of Daily Living Home Assistive Devices/Equipment: Wheelchair ADL Screening (condition at time of admission) Patient's cognitive ability adequate to safely complete daily activities?: Yes Is the patient deaf or have difficulty hearing?: No Does the patient have difficulty seeing, even when wearing glasses/contacts?: No Does the patient have difficulty concentrating, remembering, or making decisions?: No Patient able to express need for assistance with ADLs?: Yes Does the patient have difficulty dressing or bathing?: Yes Independently performs ADLs?: No Communication: Independent Dressing (OT): Needs assistance Is this a  change from baseline?: Pre-admission baseline Grooming: Needs assistance Is this a change from baseline?: Pre-admission baseline Feeding: Independent Bathing: Needs assistance Is this a change from baseline?: Pre-admission baseline Toileting: Needs assistance Is this a change from baseline?: Pre-admission baseline Walks in Home: Needs assistance Is this a change from baseline?: Pre-admission baseline Does the patient have difficulty walking or climbing stairs?: Yes Weakness of Legs: Left(RIGHT AKA) Weakness of Arms/Hands: None  Permission Sought/Granted      Share Information with NAME: [name]     Permission granted to share info w Relationship: POA     Emotional Assessment Appearance:: Appears stated age Attitude/Demeanor/Rapport: Unable to Assess Affect (typically observed): Unable to Assess   Alcohol / Substance Use: Not Applicable Psych Involvement: No (comment)  Admission diagnosis:  Acute CHF (HCC) [I50.9] Altered mental status, unspecified altered mental status type [R41.82] Patient Active Problem List   Diagnosis Date Noted  . Acute CHF (HCC) 05/20/2020  . Healthcare-associated pneumonia   . DNR (do not resuscitate) discussion   . Palliative care encounter   . Pressure injury of skin 08/16/2017  . Acute encephalopathy   . Anemia 08/15/2017  . Acute respiratory failure (HCC)    PCP:  Keane Police, MD Pharmacy:  No Pharmacies Listed    Social Determinants of Health (SDOH) Interventions    Readmission Risk Interventions No flowsheet data found.

## 2020-05-22 NOTE — Evaluation (Signed)
Occupational Therapy Evaluation Patient Details Name: Blake Villarreal MRN: [id number] DOB: January 27, 1946 Today's Date: 05/22/2020    History of Present Illness Blake Villarreal  is a 74 y.o. African-American male with a known history of type 2 diabetes mellitus, hypertension and peripheral artery disease, who presented to the emergency room with acute onset of altered mental status with decreased responsiveness and a blood glucose of 60 with improvement after being given IM glucagon however without initial significant improvement of his mental status.  During my interview he was much more alert and cooperative and oriented x3.    Clinical Impression   Blake Villarreal was seen for OT evaluation this date. Prior to hospital admission, pt was LTC resident at [facility] - pt requires lift assist for t/f and assist for I/ADLs. Pt presents to acute OT demonstrating near baseline ADLs performance. Pt grossly SUPERVISION for seated grooming tasks. MIN A sup>sit c HOB elevated. Increased time required to order meal and VCs required to sequence denture management. Per conversation c LTC facility, pt appears near functional baseline. No skilled acute OT needs identified. Will sign off at this time - please re-consult if new needs arise.     Follow Up Recommendations  No OT follow up    Equipment Recommendations  None recommended by OT    Recommendations for Other Services       Precautions / Restrictions Precautions Precautions: Fall Restrictions Weight Bearing Restrictions: Yes RLE Weight Bearing: Non weight bearing      Mobility Bed Mobility Overal bed mobility: Needs Assistance Bed Mobility: Supine to Sit     Supine to sit: Min assist;HOB elevated        Transfers                 General transfer comment: Deferred this session    Balance Overall balance assessment: Needs assistance Sitting-balance support: No upper extremity supported(LLE supported c prevalon boot  donned) Sitting balance-Leahy Scale: Good                                     ADL either performed or assessed with clinical judgement   ADL Overall ADL's : Needs assistance/impaired                                       General ADL Comments: Increased time ordering meal. SUPERVISION denture management and lotion to L thigh seated EOB      Vision         Perception     Praxis      Pertinent Vitals/Pain Pain Assessment: No/denies pain     Hand Dominance Right   Extremity/Trunk Assessment Upper Extremity Assessment Upper Extremity Assessment: Generalized weakness   Lower Extremity Assessment Lower Extremity Assessment: RLE deficits/detail RLE Deficits / Details: R AKA        Communication Communication Communication: HOH;Expressive difficulties   Cognition Arousal/Alertness: Awake/alert Behavior During Therapy: WFL for tasks assessed/performed Overall Cognitive Status: Impaired/Different from baseline Area of Impairment: Orientation                 Orientation Level: Disoriented to;Situation                 General Comments       Exercises     Shoulder Instructions  Home Living Family/patient expects to be discharged to:: Skilled nursing facility                                 Additional Comments: LTC resident at [facility]      Prior Functioning/Environment Level of Independence: Needs assistance  Gait / Transfers Assistance Needed: Assist for t/fs - pt reports type of t/s "depedns on circumstances"  ADL's / Homemaking Assistance Needed: Assist for I/ADLs - dressing, bathing, meals             OT Problem List: Decreased strength;Decreased range of motion;Decreased safety awareness      OT Treatment/Interventions: Self-care/ADL training;Therapeutic exercise;Energy conservation;DME and/or AE instruction;Therapeutic activities;Patient/family education;Balance training    OT  Goals(Current goals can be found in the care plan section) Acute Rehab OT Goals Patient Stated Goal: To get stronger OT Goal Formulation: With patient Time For Goal Achievement: 06/05/20 Potential to Achieve Goals: Good ADL Goals Pt Will Perform Grooming: with modified independence;sitting Pt Will Perform Lower Body Dressing: with min assist;bed level(c AE PRN) Pt Will Transfer to Toilet: with supervision(rolling at bed level)  OT Frequency: Min 2X/week   Barriers to D/C:            Co-evaluation              AM-PAC OT "6 Clicks" Daily Activity     Outcome Measure Help from another person eating meals?: A Little Help from another person taking care of personal grooming?: A Little Help from another person toileting, which includes using toliet, bedpan, or urinal?: A Lot Help from another person bathing (including washing, rinsing, drying)?: A Lot Help from another person to put on and taking off regular upper body clothing?: A Little Help from another person to put on and taking off regular lower body clothing?: A Lot 6 Click Score: 15   End of Session Equipment Utilized During Treatment: Oxygen(2L [place])  Activity Tolerance: Patient tolerated treatment well Patient left: in bed;with nursing/sitter in room;Other (comment)(PT and NT in room at end of session)  OT Visit Diagnosis: Other abnormalities of gait and mobility (R26.89);Muscle weakness (generalized) (M62.81)                Time: 9485-4627 OT Time Calculation (min): 27 min Charges:  OT General Charges $OT Visit: 1 Visit OT Evaluation $OT Eval Moderate Complexity: 1 Mod OT Treatments $Self Care/Home Management : 23-37 mins  Kathie Dike, M.S. OTR/L  05/22/20, 4:20 PM

## 2020-05-22 NOTE — Clinical Social Work Note (Addendum)
Pt is requesting transfer to the [facility] hospital. CSW contacted Kristopher Glee ([phone]: [phone]) (AOD) at the [facility] and she states a request was sent while pt was in the ED and it was denied because it didn't appear that the pt would need more than 48 hours of inpatient care. According to Jasmine December if a pt will be medically stable for d/c from present hospital in 48 hours or less the pt will not be granted a transfer. However, if it is estimated that pt will need more than 48 hours of inpatient care from the present then CSW can send in the appropriate paperwork and a doctor at the [name] will review for admission or denial.   MD was notified. It is unclear if pt will need more than 48 hours of inpatient care. MD is going to order PT and OT and we will determine by their recommendation. Pt has improved. CSW will continue to follow.  MD estimates pt d/c in less than 48 hours.   [place], [place] [phone]

## 2020-05-23 ENCOUNTER — Inpatient Hospital Stay: Payer: No Typology Code available for payment source

## 2020-05-23 DIAGNOSIS — I509 Heart failure, unspecified: Secondary | ICD-10-CM

## 2020-05-23 LAB — CBC WITH DIFFERENTIAL/PLATELET
Abs Immature Granulocytes: 0.08 10*3/uL — ABNORMAL HIGH (ref 0.00–0.07)
Basophils Absolute: 0.1 10*3/uL (ref 0.0–0.1)
Basophils Relative: 1 %
Eosinophils Absolute: 0.5 10*3/uL (ref 0.0–0.5)
Eosinophils Relative: 4 %
HCT: 28.5 % — ABNORMAL LOW (ref 39.0–52.0)
Hemoglobin: 8.9 g/dL — ABNORMAL LOW (ref 13.0–17.0)
Immature Granulocytes: 1 %
Lymphocytes Relative: 23 %
Lymphs Abs: 2.7 10*3/uL (ref 0.7–4.0)
MCH: 27.3 pg (ref 26.0–34.0)
MCHC: 31.2 g/dL (ref 30.0–36.0)
MCV: 87.4 fL (ref 80.0–100.0)
Monocytes Absolute: 1.1 10*3/uL — ABNORMAL HIGH (ref 0.1–1.0)
Monocytes Relative: 9 %
Neutro Abs: 7.7 10*3/uL (ref 1.7–7.7)
Neutrophils Relative %: 62 %
Platelets: 465 10*3/uL — ABNORMAL HIGH (ref 150–400)
RBC: 3.26 MIL/uL — ABNORMAL LOW (ref 4.22–5.81)
RDW: 13.9 % (ref 11.5–15.5)
Smear Review: NORMAL
WBC: 12.1 10*3/uL — ABNORMAL HIGH (ref 4.0–10.5)
nRBC: 0 % (ref 0.0–0.2)

## 2020-05-23 LAB — PHOSPHORUS: Phosphorus: 3.5 mg/dL (ref 2.5–4.6)

## 2020-05-23 LAB — COMPREHENSIVE METABOLIC PANEL
ALT: 10 U/L (ref 0–44)
AST: 12 U/L — ABNORMAL LOW (ref 15–41)
Albumin: 3 g/dL — ABNORMAL LOW (ref 3.5–5.0)
Alkaline Phosphatase: 66 U/L (ref 38–126)
Anion gap: 8 (ref 5–15)
BUN: 34 mg/dL — ABNORMAL HIGH (ref 8–23)
CO2: 27 mmol/L (ref 22–32)
Calcium: 8.6 mg/dL — ABNORMAL LOW (ref 8.9–10.3)
Chloride: 101 mmol/L (ref 98–111)
Creatinine, Ser: 1.72 mg/dL — ABNORMAL HIGH (ref 0.61–1.24)
GFR calc Af Amer: 44 mL/min — ABNORMAL LOW (ref >60)
GFR calc non Af Amer: 38 mL/min — ABNORMAL LOW (ref >60)
Glucose, Bld: 163 mg/dL — ABNORMAL HIGH (ref 70–99)
Potassium: 3.7 mmol/L (ref 3.5–5.1)
Sodium: 136 mmol/L (ref 135–145)
Total Bilirubin: 0.5 mg/dL (ref 0.3–1.2)
Total Protein: 7.4 g/dL (ref 6.5–8.1)

## 2020-05-23 LAB — GLUCOSE, CAPILLARY
Glucose-Capillary: 146 mg/dL — ABNORMAL HIGH (ref 70–99)
Glucose-Capillary: 224 mg/dL — ABNORMAL HIGH (ref 70–99)
Glucose-Capillary: 226 mg/dL — ABNORMAL HIGH (ref 70–99)
Glucose-Capillary: 240 mg/dL — ABNORMAL HIGH (ref 70–99)

## 2020-05-23 LAB — MAGNESIUM: Magnesium: 1.7 mg/dL (ref 1.7–2.4)

## 2020-05-23 MED ORDER — FUROSEMIDE 10 MG/ML IJ SOLN
40.0000 mg | Freq: Every day | INTRAMUSCULAR | Status: DC
  Administered 2020-05-23 – 2020-05-25 (×3): 40 mg via INTRAVENOUS
  Filled 2020-05-23 (×2): qty 4

## 2020-05-23 NOTE — Care Management Important Message (Signed)
Important Message  Patient Details  Name: Blake Villarreal MRN: [id number] Date of Birth: 08-10-1946   Medicare Important Message Given:  Yes  Reviewed verbally over phone with POA, Galen Daft, at [phone]. Confirmed hardcopy placed in mail to her attention.     Johnell Comings 05/23/2020, 10:15 AM

## 2020-05-23 NOTE — Progress Notes (Signed)
PROGRESS NOTE    Blake Villarreal  VOZ:[id number] DOB: [birth year] DOA: 05/20/2020 PCP: Keane Police, MD   Brief Narrative:  HPI On 05/20/2020 by Dr. Valente David Blake Villarreal a74 y.o.African-American malewith a known history of type 2 diabetes mellitus, hypertension and peripheral artery disease, who presented to the emergency room with acute onset of altered mental status with decreased responsiveness and a blood glucose of 60 with improvement after being given IM glucagon however without initial significant improvement of his mental status. During my interview he was much more alert and cooperative and oriented x3. He has been having no significant cough or wheezing or dyspnea however in the ER he dropped his pulse 70-80 7-89% on room air and that went up to. He does have left lower extremity edema with chronic wounds. No fever or chills. He denied any nausea or vomiting or abdominal pain. No chest pain or palpitations.  Upon presentation to the emergency room, blood pressure was 166/56 and otherwise vital signs were within normal. Labs revealed sodium of 134 potassium 3.5, BUN of 36 and creatinine of 2.08 compared to 34/1.78 last month. High-sensitivity troponin I was 19. CBC showed leukocytosis 28.9 with neutrophilia and anemia close to baseline as well as thrombocytosis.EKG showed normal sinus rhythm with rate of 61 with poor R wave progression and probable left atrial enlargementandleft axis deviation.Chest x-ray showed mild diffuse interstitial pulmonary opacity likely edema in the setting of cardiomegaly with no focal airspace opacity.  The patient was given IV Rocephin and vancomycin as well as 500 mill IV normal saline. He will be admitted to a progressive unit bed for further evaluation and management.  Interim History Attempt was made to transfer the patient to the [facility] at the [facility] request however patient will likely not be in the hospital more than 75  hours as he is clinically improved.  His encephalopathy is improved significantly.  He likely does have a wound infection we will continue the ceftriaxone for now.  We will resume his Lantus dosing of 3 units.  Echocardiogram done is pending read.  Assessment & Plan   Acute metabolic encephalopathy likely due to hypoglycemic encephalopathy with diabetes mellitus, type II and possibly sepsis from wound infection -His Lantus was held due to hypoglycemia and revision very slowly with insulin sliding scale -Metformin has been held -UA and chest x-ray unremarkable for infection -?  Wound infection, patient was started on IV Rocephin for possibility of nonpurulent moderate cellulitis -WBCs have improved from 28.9 down to 12.1 -hemoglobin A1c 9.8 -Wound care consulted  Acute pulmonary edema likely secondary to acute on chronic diastolic heart failure/mild acute hypoxic respiratory failure -Echocardiogram in 2018 revealed an EF of 70%, grade 1 diastolic dysfunction with mild mitral regurgitation, mild left and right atrial dilatation and severe right ventricular dilatation -Patient has been on IV Lasix 40 mg twice daily, will decrease dose to daily and monitor -Do not see any diuretics on patient's home med list -Echocardiogram 65 to 70%, grade 1 diastolic dysfunction -Checks x-ray showed improving volumes and aeration of the lungs -Continue supplemental oxygen to maintain saturations over 92% -Will likely need ambulatory home O2 screen when closer to discharge  Acute kidney injury on chronic kidney disease, stage IIIb -Patient has been diuresing on IV Lasix -creatinine currently 1.72  Normocytic anemia/anemia of chronic kidney disease -Hemoglobin currently stable, 8.9 -Anemia panel showed iron of 25, U IBC 156, TIBC 181, saturation ratio 14, ferritin 46, folate 6.03, B12 2000  -Continue to monitor  H&H  Hypertension -BP stable, continue amlodipine, Lasix (dose will be  reduced)  Dyslipidemia -Continue statin  Peripheral neuropathy -Continue Neurontin  Peripheral Arterial Disease -Continue aspirin, Plavix  GERD -Continue PPI  Wounds Pressure Injury 08/15/17 Stage II -  Partial thickness loss of dermis presenting as a shallow open ulcer with a red, pink wound bed without slough. (Active)  08/15/17 2334  Location: Buttocks  Location Orientation: Left  Staging: Stage II -  Partial thickness loss of dermis presenting as a shallow open ulcer with a red, pink wound bed without slough.  Wound Description (Comments):   Present on Admission:   -Continue with Gerhardt's Butt cream -With antibiotics and likely can switch to p.o. -We will need wound care to be continued in the outpatient setting   DVT Prophylaxis Lovenox  Code Status: Full  Family Communication: None at bedside. Discussed with cousin via phone.  Disposition Plan:  Status is: Inpatient  Remains inpatient appropriate because:IV treatments appropriate due to intensity of illness or inability to take PO   Dispo: The patient is from: SNF              Anticipated d/c is to: SNF              Anticipated d/c date is: 2 days              Patient currently is not medically stable to d/c.  Continue IV Lasix, will continue to wean and hopefully transition to oral Lasix in the next 1 to 2 days.   There has been some communication made with the [name]. Transfer was declined recommending admission and management here on admission and attempts were made to transfer the patient back to the [name] however he likely be discharged within 48 hours so they do not accept transfer.     Consultants None  Procedures  Echocardiogram  Antibiotics   Anti-infectives (From admission, onward)   Start     Dose/Rate Route Frequency Ordered Stop   05/21/20 1800  cefTRIAXone (ROCEPHIN) 1 g in sodium chloride 0.9 % 100 mL IVPB     1 g 200 mL/hr over 30 Minutes Intravenous Every 24 hours 05/21/20 0036      05/20/20 1600  vancomycin (VANCOREADY) IVPB 2000 mg/400 mL     2,000 mg 200 mL/hr over 120 Minutes Intravenous  Once 05/20/20 1551 05/20/20 2103   05/20/20 1530  vancomycin (VANCOCIN) IVPB 1000 mg/200 mL premix  Status:  Discontinued     1,000 mg 200 mL/hr over 60 Minutes Intravenous  Once 05/20/20 1524 05/20/20 1550   05/20/20 1530  cefTRIAXone (ROCEPHIN) 2 g in sodium chloride 0.9 % 100 mL IVPB     2 g 200 mL/hr over 30 Minutes Intravenous  Once 05/20/20 1524 05/20/20 1843      Subjective:   Norval Cowens seen and examined today.  Patient with no complaints this morning.  Would like for me to call his cousin [name].  Denies current chest pain.  Feels his breathing has improved.  Denies abdominal pain, nausea or vomiting.  Did not like his breakfast this morning.    Objective:   Vitals:   05/22/20 1945 05/23/20 0440 05/23/20 0722 05/23/20 1110  BP: (!) 141/67 (!) 147/54 (!) 152/65 (!) 155/59  Pulse: 68 66 60 65  Resp: 20 20 17 18   Temp: 98.7 F (37.1 C) 98.2 F (36.8 C) 97.7 F (36.5 C) 97.8 F (36.6 C)  TempSrc: Oral Oral Oral   SpO2: 100% 99%  100% 98%  Weight:      Height:        Intake/Output Summary (Last 24 hours) at 05/23/2020 1113 Last data filed at 05/23/2020 0945 Gross per 24 hour  Intake 240 ml  Output 1850 ml  Net -1610 ml   Filed Weights   05/20/20 2339 05/21/20 0440 05/22/20 0416  Weight: 78.6 kg 78.1 kg 79 kg    Exam  General: Well developed, well nourished, NAD, appears stated age  HEENT: NCAT, mucous membranes moist.   Cardiovascular: S1 S2 auscultated, RRR  Respiratory: Diminished breath sounds, no wheezing  Abdomen: Soft, nontender, nondistended, + bowel sounds  Extremities: warm dry without cyanosis clubbing or edema of  LLE.  Right AKA.  Neuro: AAOx3, nonfocal  Psych: Appropriate mood and affect   Data Reviewed: I have personally reviewed following labs and imaging studies  CBC: Recent Labs  Lab 05/20/20 1219 05/21/20 0146  05/22/20 0339 05/23/20 0439  WBC 28.9* 17.6* 11.8* 12.1*  NEUTROABS 23.7* 12.6* 7.4 7.7  HGB 8.2* 8.9* 8.5* 8.9*  HCT 26.5* 28.2* 26.9* 28.5*  MCV 88.0 87.9 86.8 87.4  PLT 481* 462* 459* 465*   Basic Metabolic Panel: Recent Labs  Lab 05/20/20 1219 05/20/20 2347 05/21/20 0146 05/22/20 0339 05/23/20 0439  NA 134*  --  137 137 136  K 3.5  --  4.0 3.7 3.7  CL 102  --  104 103 101  CO2 24  --  GLUCOSE 130*  --  77 222* 163*  BUN 36*  --  35* 36* 34*  CREATININE 2.08* 2.01* 1.95* 1.94* 1.72*  CALCIUM 8.8*  --  8.8* 8.5* 8.6*  MG  --  2.2  --  1.9 1.7  PHOS  --   --   --  4.0 3.5   GFR: Estimated Creatinine Clearance: 37.7 mL/min (A) (by C-G formula based on SCr of 1.72 mg/dL (H)). Liver Function Tests: Recent Labs  Lab 05/20/20 1219 05/22/20 0339 05/23/20 0439  AST 18 14* 12*  ALT ALKPHOS 68 74 66  BILITOT 0.7 0.6 0.5  PROT 8.1 7.2 7.4  ALBUMIN 3.5 3.0* 3.0*   No results for input(s): LIPASE, AMYLASE in the last 168 hours. No results for input(s): AMMONIA in the last 168 hours. Coagulation Profile: No results for input(s): INR, PROTIME in the last 168 hours. Cardiac Enzymes: No results for input(s): CKTOTAL, CKMB, CKMBINDEX, TROPONINI in the last 168 hours. BNP (last 3 results) No results for input(s): PROBNP in the last 8760 hours. HbA1C: Recent Labs    05/20/20 2347  HGBA1C 9.8*   CBG: Recent Labs  Lab 05/22/20 0742 05/22/20 1201 05/22/20 1650 05/22/20 2052 05/23/20 0723  GLUCAP 207* 171* 164* 188* 146*   Lipid Profile: No results for input(s): CHOL, HDL, LDLCALC, TRIG, CHOLHDL, LDLDIRECT in the last 72 hours. Thyroid Function Tests: Recent Labs    05/20/20 2347  TSH 1.238   Anemia Panel: Recent Labs    05/22/20 0339  VITAMINB12 2,800*  FOLATE 6.3  FERRITIN 46  TIBC 181*  IRON 25*  RETICCTPCT 1.4   Urine analysis:    Component Value Date/Time   COLORURINE YELLOW (A) 05/20/2020 1444   APPEARANCEUR CLEAR (A)  05/20/2020 1444   LABSPEC 1.012 05/20/2020 1444   PHURINE 5.0 05/20/2020 1444   GLUCOSEU NEGATIVE 05/20/2020 1444   HGBUR NEGATIVE 05/20/2020 1444   BILIRUBINUR NEGATIVE 05/20/2020 1444   KETONESUR NEGATIVE 05/20/2020 1444   PROTEINUR NEGATIVE 05/20/2020  [place] 05/20/2020 [place] 05/20/2020 1444   Sepsis Labs: @LABRCNTIP (procalcitonin:4,lacticidven:4)  ) Recent Results (from the past 240 hour(s))  SARS Coronavirus 2 by RT PCR (hospital order, performed in [facility] hospital lab) Nasopharyngeal Nasopharyngeal Swab     Status: None   Collection Time: 05/20/20  3:10 PM   Specimen: Nasopharyngeal Swab  Result Value Ref Range Status   SARS Coronavirus 2 NEGATIVE NEGATIVE Final    Comment: (NOTE) SARS-CoV-2 target nucleic acids are NOT DETECTED. The SARS-CoV-2 RNA is generally detectable in upper and lower respiratory specimens during the acute phase of infection. The lowest concentration of SARS-CoV-2 viral copies this assay can detect is 250 copies / mL. A negative result does not preclude SARS-CoV-2 infection and should not be used as the sole basis for treatment or other patient management decisions.  A negative result may occur with improper specimen collection / handling, submission of specimen other than nasopharyngeal swab, presence of viral mutation(s) within the areas targeted by this assay, and inadequate number of viral copies (<250 copies / mL). A negative result must be combined with clinical observations, patient history, and epidemiological information. Fact Sheet for Patients:   StrictlyIdeas.no Fact Sheet for Healthcare Providers: BankingDealers.co.za This test is not yet approved or cleared  by the [place] FDA and has been authorized for detection and/or diagnosis of SARS-CoV-2 by FDA under an Emergency Use Authorization (EUA).  This EUA will remain in effect (meaning this  test can be used) for the duration of the COVID-19 declaration under Section 564(b)(1) of the Act, 21 U.S.C. section 360bbb-3(b)(1), unless the authorization is terminated or revoked sooner. Performed at [facility], [address] [place], [place] [address]   MRSA PCR Screening     Status: Abnormal   Collection Time: 05/20/20 11:24 PM   Specimen: Nasal Mucosa; Nasopharyngeal  Result Value Ref Range Status   MRSA by PCR POSITIVE (A) NEGATIVE Final    Comment:        The GeneXpert MRSA Assay (FDA approved for NASAL specimens only), is one component of a comprehensive MRSA colonization surveillance program. It is not intended to diagnose MRSA infection nor to guide or monitor treatment for MRSA infections. RESULT CALLED TO, READ BACK BY AND VERIFIED WITH: MARSHA TURNER 05/21/20 AT 0153 HS Performed at [facility], [address] [place], [place] [address]   Culture, blood (routine x 2)     Status: None (Preliminary result)   Collection Time: 05/21/20  1:46 AM   Specimen: BLOOD  Result Value Ref Range Status   Specimen Description BLOOD RIGHT ASSIST CONTROL  Final   Special Requests   Final    BOTTLES DRAWN AEROBIC AND ANAEROBIC Blood Culture results may not be optimal due to an inadequate volume of blood received in culture bottles   Culture   Final    NO GROWTH 2 DAYS Performed at [facility], [address] [place], [place] [address]    Report Status PENDING  Incomplete  Culture, blood (routine x 2)     Status: None (Preliminary result)   Collection Time: 05/21/20  1:56 AM   Specimen: BLOOD  Result Value Ref Range Status   Specimen Description BLOOD LEFT HAND  Final   Special Requests   Final    BOTTLES DRAWN AEROBIC ONLY Blood Culture adequate volume   Culture   Final    NO GROWTH 2 DAYS Performed at [facility], [place]  [address] [place], [place] [address]    Report Status PENDING  Incomplete      Radiology Studies: DG  Chest 1 View  Result Date: 05/23/2020 CLINICAL DATA:  Shortness of breath. EXAM: CHEST  1 VIEW COMPARISON:  Prior chest radiographs 05/22/2020 and earlier FINDINGS: Unchanged cardiomegaly. Aortic atherosclerosis. Persistent subtle interstitial prominence within both lungs, which may reflect mild interstitial edema. No evidence of airspace consolidation. Redemonstrated scarring within the left lung base. No evidence of pleural effusion or pneumothorax. No acute bony abnormality identified. IMPRESSION: Persistent subtle interstitial prominence within both lungs, which is nonspecific but may reflect mild interstitial edema. Redemonstrated scarring within the left lung base. Unchanged cardiomegaly. Aortic Atherosclerosis (ICD10-I70.0). Electronically Signed   By: Jackey LogeKyle  Golden DO   On: 05/23/2020 08:49   DG Chest 1 View  Result Date: 05/22/2020 CLINICAL DATA:  Shortness of breath EXAM: CHEST  1 VIEW COMPARISON:  Radiograph 05/20/2020, CT 08/18/2008 FINDINGS: Improving volumes and aeration of the lungs with diminishing opacities. Stable cardiomegaly with a calcified aorta. No visible pneumothorax or effusion. Abundant mediastinal fat similar to comparison CT. No acute osseous or soft tissue abnormality. Degenerative changes are present in the imaged spine and shoulders. Telemetry leads overlie the chest. IMPRESSION: Improving volumes and aeration of the lungs with diminishing opacities likely reflecting resolving atelectasis and/or improving edema. Electronically Signed   By: Kreg ShropshirePrice  DeHay M.D.   On: 05/22/2020 05:49   ECHOCARDIOGRAM COMPLETE  Result Date: 05/22/2020    ECHOCARDIOGRAM REPORT   Patient Name:   Blake Villarreal Date of Exam: 05/22/2020 Medical Rec #:  [id number]       Height:       69.0 in Accession #:    [id number]      Weight:       174.2 lb Date of Birth:  Jan 21, 1946      BSA:          1.948 m Patient Age:    73 years        BP:           154/62 mmHg Patient Gender: M               HR:           64  bpm. Exam Location:  [facility] Procedure: 2D Echo, Cardiac Doppler and Color Doppler Indications:     CHF-acute diastolic 428.31  History:         Patient has prior history of Echocardiogram examinations, most                  recent 08/15/2017. Risk Factors:Hypertension and Diabetes. PAD.  Sonographer:     Cristela BlueJerry Hege RDCS (AE) Referring Phys:  [id number] Vernetta HoneyJAN A MANSY Diagnosing Phys: Arnoldo HookerBruce Kowalski MD IMPRESSIONS  1. Left ventricular ejection fraction, by estimation, is 65 to 70%. The left ventricle has normal function. The left ventricle has no regional wall motion abnormalities. There is moderate left ventricular hypertrophy. Left ventricular diastolic parameters are consistent with Grade I diastolic dysfunction (impaired relaxation).  2. Right ventricular systolic function is normal. The right ventricular size is normal. There is normal pulmonary artery systolic pressure.  3. The mitral valve is normal in structure. Mild mitral valve regurgitation.  4. The aortic valve is normal in structure. Aortic valve regurgitation is not visualized. FINDINGS  Left Ventricle: Left ventricular ejection fraction, by estimation, is 65 to 70%. The left ventricle has normal function. The left ventricle has no regional wall motion abnormalities. The left  ventricular internal cavity size was normal in size. There is  moderate left ventricular hypertrophy. Left ventricular diastolic parameters are consistent with Grade I diastolic dysfunction (impaired relaxation). Right Ventricle: The right ventricular size is normal. No increase in right ventricular wall thickness. Right ventricular systolic function is normal. There is normal pulmonary artery systolic pressure. The tricuspid regurgitant velocity is 2.36 m/s, and  with an assumed right atrial pressure of 10 mmHg, the estimated right ventricular systolic pressure is 32.3 mmHg. Left Atrium: Left atrial size was normal in size. Right Atrium: Right atrial size was normal in size. Pericardium:  There is no evidence of pericardial effusion. Mitral Valve: The mitral valve is normal in structure. Mild mitral valve regurgitation. Tricuspid Valve: The tricuspid valve is normal in structure. Tricuspid valve regurgitation is trivial. Aortic Valve: The aortic valve is normal in structure. Aortic valve regurgitation is not visualized. Aortic valve mean gradient measures 3.5 mmHg. Aortic valve peak gradient measures 7.2 mmHg. Aortic valve area, by VTI measures 3.20 cm. Pulmonic Valve: The pulmonic valve was normal in structure. Pulmonic valve regurgitation is not visualized. Aorta: The aortic root and ascending aorta are structurally normal, with no evidence of dilitation. IAS/Shunts: No atrial level shunt detected by color flow Doppler.  LEFT VENTRICLE PLAX 2D LVIDd:         3.73 cm  Diastology LVIDs:         2.00 cm  LV e' lateral:   4.46 cm/s LV PW:         1.41 cm  LV E/e' lateral: 17.3 LV IVS:        2.19 cm  LV e' medial:    3.48 cm/s LVOT diam:     2.10 cm  LV E/e' medial:  22.2 LV SV:         74 LV SV Index:   38 LVOT Area:     3.46 cm  RIGHT VENTRICLE RV Basal diam:  3.21 cm RV S prime:     16.10 cm/s TAPSE (M-mode): 3.3 cm LEFT ATRIUM             Index       RIGHT ATRIUM           Index LA diam:        2.40 cm 1.23 cm/m  RA Area:     18.50 cm LA Vol (A2C):   62.1 ml 31.88 ml/m RA Volume:   53.50 ml  27.46 ml/m LA Vol (A4C):   59.1 ml 30.34 ml/m LA Biplane Vol: 61.1 ml 31.36 ml/m  AORTIC VALVE                   PULMONIC VALVE AV Area (Vmax):    2.60 cm    PV Vmax:        0.95 m/s AV Area (Vmean):   2.40 cm    PV Peak grad:   3.6 mmHg AV Area (VTI):     3.20 cm    RVOT Peak grad: 4 mmHg AV Vmax:           134.50 cm/s AV Vmean:          85.850 cm/s AV VTI:            0.230 m AV Peak Grad:      7.2 mmHg AV Mean Grad:      3.5 mmHg LVOT Vmax:         101.00 cm/s LVOT Vmean:  59.500 cm/s LVOT VTI:          0.213 m LVOT/AV VTI ratio: 0.92  AORTA Ao Root diam: 3.10 cm MITRAL VALVE                 TRICUSPID VALVE MV Area (PHT): 2.01 cm     TR Peak grad:   22.3 mmHg MV Decel Time: 377 msec     TR Vmax:        236.00 cm/s MV E velocity: 77.10 cm/s MV A velocity: 101.00 cm/s  SHUNTS MV E/A ratio:  0.76         Systemic VTI:  0.21 m                             Systemic Diam: 2.10 cm Arnoldo Hooker MD Electronically signed by Arnoldo Hooker MD Signature Date/Time: 05/22/2020/5:39:45 PM    Final      Scheduled Meds: . amLODipine  10 mg Oral Daily  . aspirin  81 mg Oral Daily  . atorvastatin  20 mg Oral Daily  . Chlorhexidine Gluconate Cloth  6 each Topical Q0600  . cholecalciferol  1,000 Units Oral Daily  . clopidogrel  75 mg Oral Daily  . enoxaparin (LOVENOX) injection  40 mg Subcutaneous Q24H  . furosemide  40 mg Intravenous Daily  . gabapentin  100 mg Oral BID  . Gerhardt's butt cream   Topical TID  . hydrALAZINE  25 mg Oral TID  . insulin aspart  0-9 Units Subcutaneous TID PC & HS  . insulin glargine  3 Units Subcutaneous QHS  . lacosamide  100 mg Oral BID  . magnesium oxide  400 mg Oral Daily  . mirtazapine  7.5 mg Oral QHS  . mupirocin ointment  1 application Nasal BID  . pantoprazole  40 mg Oral BID AC  . sodium chloride flush  3 mL Intravenous Q12H  . terazosin  5 mg Oral QHS  . vitamin B-12  1,000 mcg Oral Daily   Continuous Infusions: . sodium chloride    . cefTRIAXone (ROCEPHIN)  IV 1 g (05/22/20 1718)     LOS: 3 days   Time Spent in minutes   45 minutes  Aranda Bihm D.O. on 05/23/2020 at 11:13 AM  Between 7am to 7pm - Please see pager noted on [email or web address]  After 7pm go to [email or web address]  And look for the night coverage person covering for me after hours  [facility] Office  [phone]

## 2020-05-23 NOTE — Plan of Care (Signed)
?  Problem: Clinical Measurements: ?Goal: Ability to maintain clinical measurements within normal limits will improve ?Outcome: Not Progressing ?  ?

## 2020-05-24 ENCOUNTER — Inpatient Hospital Stay: Payer: No Typology Code available for payment source

## 2020-05-24 DIAGNOSIS — N179 Acute kidney failure, unspecified: Secondary | ICD-10-CM

## 2020-05-24 DIAGNOSIS — I5031 Acute diastolic (congestive) heart failure: Secondary | ICD-10-CM

## 2020-05-24 DIAGNOSIS — E119 Type 2 diabetes mellitus without complications: Secondary | ICD-10-CM

## 2020-05-24 DIAGNOSIS — Z794 Long term (current) use of insulin: Secondary | ICD-10-CM

## 2020-05-24 LAB — CBC WITH DIFFERENTIAL/PLATELET
Abs Immature Granulocytes: 0.12 10*3/uL — ABNORMAL HIGH (ref 0.00–0.07)
Basophils Absolute: 0.1 10*3/uL (ref 0.0–0.1)
Basophils Relative: 1 %
Eosinophils Absolute: 0.4 10*3/uL (ref 0.0–0.5)
Eosinophils Relative: 3 %
HCT: 27.9 % — ABNORMAL LOW (ref 39.0–52.0)
Hemoglobin: 8.9 g/dL — ABNORMAL LOW (ref 13.0–17.0)
Immature Granulocytes: 1 %
Lymphocytes Relative: 22 %
Lymphs Abs: 2.9 10*3/uL (ref 0.7–4.0)
MCH: 27.2 pg (ref 26.0–34.0)
MCHC: 31.9 g/dL (ref 30.0–36.0)
MCV: 85.3 fL (ref 80.0–100.0)
Monocytes Absolute: 1.1 10*3/uL — ABNORMAL HIGH (ref 0.1–1.0)
Monocytes Relative: 8 %
Neutro Abs: 8.6 10*3/uL — ABNORMAL HIGH (ref 1.7–7.7)
Neutrophils Relative %: 65 %
Platelets: 468 10*3/uL — ABNORMAL HIGH (ref 150–400)
RBC: 3.27 MIL/uL — ABNORMAL LOW (ref 4.22–5.81)
RDW: 14 % (ref 11.5–15.5)
Smear Review: NORMAL
WBC: 13.3 10*3/uL — ABNORMAL HIGH (ref 4.0–10.5)
nRBC: 0 % (ref 0.0–0.2)

## 2020-05-24 LAB — BASIC METABOLIC PANEL
Anion gap: 8 (ref 5–15)
BUN: 33 mg/dL — ABNORMAL HIGH (ref 8–23)
CO2: 27 mmol/L (ref 22–32)
Calcium: 8.5 mg/dL — ABNORMAL LOW (ref 8.9–10.3)
Chloride: 101 mmol/L (ref 98–111)
Creatinine, Ser: 1.68 mg/dL — ABNORMAL HIGH (ref 0.61–1.24)
GFR calc Af Amer: 46 mL/min — ABNORMAL LOW (ref >60)
GFR calc non Af Amer: 39 mL/min — ABNORMAL LOW (ref >60)
Glucose, Bld: 220 mg/dL — ABNORMAL HIGH (ref 70–99)
Potassium: 3.9 mmol/L (ref 3.5–5.1)
Sodium: 136 mmol/L (ref 135–145)

## 2020-05-24 LAB — GLUCOSE, CAPILLARY
Glucose-Capillary: 200 mg/dL — ABNORMAL HIGH (ref 70–99)
Glucose-Capillary: 230 mg/dL — ABNORMAL HIGH (ref 70–99)
Glucose-Capillary: 333 mg/dL — ABNORMAL HIGH (ref 70–99)
Glucose-Capillary: 320 mg/dL — ABNORMAL HIGH (ref 70–99)

## 2020-05-24 MED ORDER — INSULIN ASPART 100 UNIT/ML ~~LOC~~ SOLN
3.0000 [IU] | Freq: Three times a day (TID) | SUBCUTANEOUS | Status: DC
  Administered 2020-05-25 (×3): 3 [IU] via SUBCUTANEOUS
  Filled 2020-05-24 (×3): qty 1

## 2020-05-24 MED ORDER — INSULIN GLARGINE 100 UNIT/ML ~~LOC~~ SOLN
5.0000 [IU] | Freq: Every day | SUBCUTANEOUS | Status: DC
  Administered 2020-05-24: 5 [IU] via SUBCUTANEOUS
  Filled 2020-05-24 (×2): qty 0.05

## 2020-05-24 NOTE — Plan of Care (Signed)
  Problem: Health Behavior/Discharge Planning: Goal: Ability to manage health-related needs will improve Outcome: Progressing   

## 2020-05-24 NOTE — Plan of Care (Signed)

## 2020-05-24 NOTE — Progress Notes (Signed)
PROGRESS NOTE    Blake Villarreal  ZOX:[id number] DOB: September 03, 1946 DOA: 05/20/2020 PCP: Keane Police, MD   Brief Narrative:  HPI On 05/20/2020 by Dr. Valente Blake Villarreal a74 y.o.African-American malewith a known history of type 2 diabetes mellitus, hypertension and peripheral artery disease, who presented to the emergency room with acute onset of altered mental status with decreased responsiveness and a blood glucose of 60 with improvement after being given IM glucagon however without initial significant improvement of his mental status. During my interview he was much more alert and cooperative and oriented x3. He has been having no significant cough or wheezing or dyspnea however in the ER he dropped his pulse 70-80 7-89% on room air and that went up to. He does have left lower extremity edema with chronic wounds. No fever or chills. He denied any nausea or vomiting or abdominal pain. No chest pain or palpitations.  Upon presentation to the emergency room, blood pressure was 166/56 and otherwise vital signs were within normal. Labs revealed sodium of 134 potassium 3.5, BUN of 36 and creatinine of 2.08 compared to 34/1.78 last month. High-sensitivity troponin I was 19. CBC showed leukocytosis 28.9 with neutrophilia and anemia close to baseline as well as thrombocytosis.EKG showed normal sinus rhythm with rate of 61 with poor R wave progression and probable left atrial enlargementandleft axis deviation.Chest x-ray showed mild diffuse interstitial pulmonary opacity likely edema in the setting of cardiomegaly with no focal airspace opacity.  The patient was given IV Rocephin and vancomycin as well as 500 mill IV normal saline. He will be admitted to a progressive unit bed for further evaluation and management.  Interim History Attempt was made to transfer the patient to the [facility] at the [facility] request however patient will likely not be in the hospital more than 48  hours as he is clinically improved.  His encephalopathy is improved significantly.  He likely does have a wound infection we will continue the ceftriaxone for now.  We will resume his Lantus dosing of 3 units.  Echocardiogram done is pending read.  Assessment & Plan   Acute metabolic encephalopathy likely due to hypoglycemic encephalopathy with diabetes mellitus, type II and possibly sepsis from wound infection -His Lantus was held due to hypoglycemia and revision very slowly with insulin sliding scale -Metformin has been held -UA and chest x-ray unremarkable for infection -?  Wound infection, patient was started on IV Rocephin for possibility of nonpurulent moderate cellulitis left leg, blood culture no growth -WBCs have improved from 28.9 down to 12.1-13 -hemoglobin A1c 9.8 -Wound care consulted, will benefit from following up at wound care clinic  Insulin-dependent type 2 diabetes, uncontrolled, with hyperglycemia and hypoglycemia -Blood glucose start to trend up, increase Lantus, add meal coverage, continue SSI  Acute pulmonary edema likely secondary to acute on chronic diastolic heart failure/mild acute hypoxic respiratory failure -Echocardiogram in 2018 revealed an EF of 70%, grade 1 diastolic dysfunction with mild mitral regurgitation, mild left and right atrial dilatation and severe right ventricular dilatation -Patient has been on IV Lasix 40 mg twice daily, will decrease dose to daily and monitor -Do not see any diuretics on patient's home med list -Echocardiogram 65 to 70%, grade 1 diastolic dysfunction -Checks x-ray showed improving volumes and aeration of the lungs -improving, he is on room air -baseline wheelchair bound  Acute kidney injury on chronic kidney disease, stage IIIb -Patient has been diuresing on IV Lasix -cr 2 on admission,creatinine currently 1.68, will get renal US  Normocytic anemia/anemia of chronic kidney disease -Hemoglobin currently stable, 8.9 -Anemia  panel showed iron of 25, U IBC 156, TIBC 181, saturation ratio 14, ferritin 46, folate 6.03, B12 2000  -Continue to monitor H&H  Hypertension -BP stable, continue amlodipine, Lasix (dose will be reduced)  Dyslipidemia -Continue statin  Peripheral neuropathy -Continue Neurontin  Peripheral Arterial Disease -Continue aspirin, Plavix  GERD -Continue PPI  Wounds Pressure Injury 08/15/17 Stage II -  Partial thickness loss of dermis presenting as a shallow open ulcer with a red, pink wound bed without slough. (Active)  08/15/17 2334  Location: Buttocks  Location Orientation: Left  Staging: Stage II -  Partial thickness loss of dermis presenting as a shallow open ulcer with a red, pink wound bed without slough.  Wound Description (Comments):   Present on Admission:   -Continue with Gerhardt's Butt cream -With antibiotics and likely can switch to p.o. 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 Cover with silver hydrofiber (Aquacel Advantage, Lawson # [id number]), top with dry gauze 4x4 and top with silicone foam dressing. Change daily and PRN soiling.    Left LE wound presents on admission:  Cleanse with soap and water, rinse and pat dry. Place an ABD pad at the anterior and posterior LE, wrap with Kerlix roll gauze from just below toes to just below knee and secure with paper tape.  Place foot into [employer].   DVT Prophylaxis Lovenox  Code Status: Full  Family Communication: patient  Disposition Plan:  Status is: Inpatient  Remains inpatient appropriate because:IV treatments appropriate due to intensity of illness or inability to take PO   Dispo: The patient is from: SNF              Anticipated d/c is to: SNF              Anticipated d/c date is: tomorrow              Patient currently needs renal US, on iv lasix and iv rocephin,  likely able to d/c in am on oral lasix and oral abx if renal function improving , renal US unremarkable, and blood glucose controlled  There has been some communication made with the [name]. Transfer was declined recommending admission and management here on admission and attempts were made to transfer the patient back to the [name] however he likely be discharged within 48 hours so they do not accept transfer.     Consultants Wound care  Procedures  Echocardiogram  Antibiotics   Anti-infectives (From admission, onward)   Start     Dose/Rate Route Frequency Ordered Stop   05/21/20 1800  cefTRIAXone (ROCEPHIN) 1 g in sodium chloride 0.9 % 100 mL IVPB     1 g 200 mL/hr over 30 Minutes Intravenous Every 24 hours 05/21/20 0036     05/20/20 1600  vancomycin (VANCOREADY) IVPB 2000 mg/400 mL     2,000 mg 200 mL/hr over 120 Minutes Intravenous  Once 05/20/20 1551 05/20/20 2103   05/20/20 1530  vancomycin (VANCOCIN) IVPB 1000 mg/200 mL premix  Status:  Discontinued     1,000 mg 200 mL/hr over 60 Minutes Intravenous  Once 05/20/20 1524 05/20/20 1550   05/20/20 1530  cefTRIAXone (ROCEPHIN) 2 g in sodium chloride 0.9 % 100 mL IVPB     2 g 200 mL/hr over 30 Minutes Intravenous  Once 05/20/20 1524 05/20/20 1843      Subjective:   Carroll KindsPetie Stickler seen and examined today.  Patient c/o butt pain   Denies current chest pain.  Feels his breathing has improved.  Denies abdominal pain, nausea or vomiting.     Objective:   Vitals:   05/23/20 1631 05/23/20 1952 05/24/20 0423 05/24/20 0723  BP: (!) 148/68 (!) 159/62 (!) 143/60 (!) 141/59  Pulse: 70 66 68 63  Resp: 17 20 20 17   Temp: 98.1 F (36.7 C) 98.6 F (37 C) 98.5 F (36.9 C) 98 F (36.7 C)  TempSrc: Oral Oral Oral Oral  SpO2: 100% 100% 100% 99%  Weight:   79 kg   Height:        Intake/Output Summary (Last 24 hours) at 05/24/2020 1017 Last data filed at 05/24/2020 [id number] Gross per 24 hour  Intake --  Output 1200 ml  Net -1200 ml   Filed  Weights   05/21/20 0440 05/22/20 0416 05/24/20 0423  Weight: 78.1 kg 79 kg 79 kg    Exam  General: Well developed, well nourished, NAD, appears stated age  HEENT: NCAT, mucous membranes moist.   Cardiovascular: S1 S2 auscultated, RRR  Respiratory: Diminished breath sounds, no wheezing  Abdomen: Soft, nontender, nondistended, + bowel sounds  Extremities: warm dry without cyanosis clubbing or edema of  LLE.  Right AKA.  Neuro: AAOx3, nonfocal  Psych: Appropriate mood and affect   Data Reviewed: I have personally reviewed following labs and imaging studies  CBC: Recent Labs  Lab 05/20/20 1219 05/21/20 0146 05/22/20 0339 05/23/20 0439 05/24/20 0546  WBC 28.9* 17.6* 11.8* 12.1* 13.3*  NEUTROABS 23.7* 12.6* 7.4 7.7 8.6*  HGB 8.2* 8.9* 8.5* 8.9* 8.9*  HCT 26.5* 28.2* 26.9* 28.5* 27.9*  MCV 88.0 87.9 86.8 87.4 85.3  PLT 481* 462* 459* 465* 468*   Basic Metabolic Panel: Recent Labs  Lab 05/20/20 1219 05/20/20 1219 05/20/20 2347 05/21/20 0146 05/22/20 0339 05/23/20 0439 05/24/20 0546  NA 134*  --   --  137 137 136 136  K 3.5  --   --  4.0 3.7 3.7 3.9  CL 102  --   --  104 103 101 101  CO2 24  --   --  23 26 27 27   GLUCOSE 130*  --   --  77 222* 163* 220*  BUN 36*  --   --  35* 36* 34* 33*  CREATININE 2.08*   < > 2.01* 1.95* 1.94* 1.72* 1.68*  CALCIUM 8.8*  --   --  8.8* 8.5* 8.6* 8.5*  MG  --   --  2.2  --  1.9 1.7  --   PHOS  --   --   --   --  4.0 3.5  --    < > = values in this interval not displayed.   GFR: Estimated Creatinine Clearance: 38.6 mL/min (A) (by C-G formula based on SCr of 1.68 mg/dL (H)). Liver Function Tests: Recent Labs  Lab 05/20/20 1219 05/22/20 0339 05/23/20 0439  AST 18 14* 12*  ALT 12 12 10   ALKPHOS 68 74 66  BILITOT 0.7 0.6 0.5  PROT 8.1 7.2 7.4  ALBUMIN 3.5 3.0* 3.0*   No results for input(s): LIPASE, AMYLASE in the last 168 hours. No results for input(s): AMMONIA in the last 168 hours. Coagulation Profile: No results  for input(s): INR, PROTIME in the last 168 hours. Cardiac Enzymes: No results for input(s): CKTOTAL, CKMB, CKMBINDEX, TROPONINI in  the last 168 hours. BNP (last 3 results) No results for input(s): PROBNP in the last 8760 hours. HbA1C: No results for input(s): HGBA1C in the last 72 hours. CBG: Recent Labs  Lab 05/23/20 0723 05/23/20 1204 05/23/20 1630 05/23/20 2039 05/24/20 0726  GLUCAP 146* 224* 226* 240* 200*   Lipid Profile: No results for input(s): CHOL, HDL, LDLCALC, TRIG, CHOLHDL, LDLDIRECT in the last 72 hours. Thyroid Function Tests: No results for input(s): TSH, T4TOTAL, FREET4, T3FREE, THYROIDAB in the last 72 hours. Anemia Panel: Recent Labs    05/22/20 0339  VITAMINB12 2,800*  FOLATE 6.3  FERRITIN 46  TIBC 181*  IRON 25*  RETICCTPCT 1.4   Urine analysis:    Component Value Date/Time   COLORURINE YELLOW (A) 05/20/2020 1444   APPEARANCEUR CLEAR (A) 05/20/2020 1444   LABSPEC 1.012 05/20/2020 1444   PHURINE 5.0 05/20/2020 1444   GLUCOSEU NEGATIVE 05/20/2020 1444   HGBUR NEGATIVE 05/20/2020 1444   BILIRUBINUR NEGATIVE 05/20/2020 1444   KETONESUR NEGATIVE 05/20/2020 1444   PROTEINUR NEGATIVE 05/20/2020 1444   NITRITE NEGATIVE 05/20/2020 1444   LEUKOCYTESUR NEGATIVE 05/20/2020 1444   Sepsis Labs: (procalcitonin:4,lacticidven:4)  ) Recent Results (from the past 240 hour(s))  SARS Coronavirus 2 by RT PCR (hospital order, performed in [facility] hospital lab) Nasopharyngeal Nasopharyngeal Swab     Status: None   Collection Time: 05/20/20  3:10 PM   Specimen: Nasopharyngeal Swab  Result Value Ref Range Status   SARS Coronavirus 2 NEGATIVE NEGATIVE Final    Comment: (NOTE) SARS-CoV-2 target nucleic acids are NOT DETECTED. The SARS-CoV-2 RNA is generally detectable in upper and lower respiratory specimens during the acute phase of infection. The lowest concentration of SARS-CoV-2 viral copies this assay can detect is 250 copies / mL. A negative  result does not preclude SARS-CoV-2 infection and should not be used as the sole basis for treatment or other patient management decisions.  A negative result may occur with improper specimen collection / handling, submission of specimen other than nasopharyngeal swab, presence of viral mutation(s) within the areas targeted by this assay, and inadequate number of viral copies (<250 copies / mL). A negative result must be combined with clinical observations, patient history, and epidemiological information. Fact Sheet for Patients:   BoilerBrush.com.cy Fact Sheet for Healthcare Providers: https://pope.com/ This test is not yet approved or cleared  by the [place] FDA and has been authorized for detection and/or diagnosis of SARS-CoV-2 by FDA under an Emergency Use Authorization (EUA).  This EUA will remain in effect (meaning this test can be used) for the duration of the COVID-19 declaration under Section 564(b)(1) of the Act, 21 U.S.C. section 360bbb-3(b)(1), unless the authorization is terminated or revoked sooner. Performed at [facility], [address] [place], [place] [address]   MRSA PCR Screening     Status: Abnormal   Collection Time: 05/20/20 11:24 PM   Specimen: Nasal Mucosa; Nasopharyngeal  Result Value Ref Range Status   MRSA by PCR POSITIVE (A) NEGATIVE Final    Comment:        The GeneXpert MRSA Assay (FDA approved for NASAL specimens only), is one component of a comprehensive MRSA colonization surveillance program. It is not intended to diagnose MRSA infection nor to guide or monitor treatment for MRSA infections. RESULT CALLED TO, READ BACK BY AND VERIFIED WITH: MARSHA TURNER 05/21/20 AT 0153 HS Performed at [facility], [address] [place], [place] [address]   Culture, blood (routine x 2)  Status: None (Preliminary result)   Collection Time: 05/21/20  1:46 AM   Specimen: BLOOD   Result Value Ref Range Status   Specimen Description BLOOD RIGHT ASSIST CONTROL  Final   Special Requests   Final    BOTTLES DRAWN AEROBIC AND ANAEROBIC Blood Culture results may not be optimal due to an inadequate volume of blood received in culture bottles   Culture   Final    NO GROWTH 3 DAYS Performed at [facility], [address] [place], [place] [address]    Report Status PENDING  Incomplete  Culture, blood (routine x 2)     Status: None (Preliminary result)   Collection Time: 05/21/20  1:56 AM   Specimen: BLOOD  Result Value Ref Range Status   Specimen Description BLOOD LEFT HAND  Final   Special Requests   Final    BOTTLES DRAWN AEROBIC ONLY Blood Culture adequate volume   Culture   Final    NO GROWTH 3 DAYS Performed at [facility], [address] [place], [place] [address]    Report Status PENDING  Incomplete      Radiology Studies: DG Chest 1 View  Result Date: 05/23/2020 CLINICAL DATA:  Shortness of breath. EXAM: CHEST  1 VIEW COMPARISON:  Prior chest radiographs 05/22/2020 and earlier FINDINGS: Unchanged cardiomegaly. Aortic atherosclerosis. Persistent subtle interstitial prominence within both lungs, which may reflect mild interstitial edema. No evidence of airspace consolidation. Redemonstrated scarring within the left lung base. No evidence of pleural effusion or pneumothorax. No acute bony abnormality identified. IMPRESSION: Persistent subtle interstitial prominence within both lungs, which is nonspecific but may reflect mild interstitial edema. Redemonstrated scarring within the left lung base. Unchanged cardiomegaly. Aortic Atherosclerosis (ICD10-I70.0). Electronically Signed   By: Jackey Loge DO   On: 05/23/2020 08:49   ECHOCARDIOGRAM COMPLETE  Result Date: 05/22/2020    ECHOCARDIOGRAM REPORT   Patient Name:   ASTER ECKRICH Date of Exam: 05/22/2020 Medical Rec #:  [id number]       Height:       69.0 in Accession #:    [id number]      Weight:        174.2 lb Date of Birth:  03-07-1946      BSA:          1.948 m Patient Age:    73 years        BP:           154/62 mmHg Patient Gender: M               HR:           64 bpm. Exam Location:  [facility] Procedure: 2D Echo, Cardiac Doppler and Color Doppler Indications:     CHF-acute diastolic 428.31  History:         Patient has prior history of Echocardiogram examinations, most                  recent 08/15/2017. Risk Factors:Hypertension and Diabetes. PAD.  Sonographer:     Cristela Blue RDCS (AE) Referring Phys:  [id number] Vernetta Honey MANSY Diagnosing Phys: Arnoldo Hooker MD IMPRESSIONS  1. Left ventricular ejection fraction, by estimation, is 65 to 70%. The left ventricle has normal function. The left ventricle has no regional wall motion abnormalities. There is moderate left ventricular hypertrophy. Left ventricular diastolic parameters are consistent with Grade I diastolic dysfunction (impaired relaxation).  2. Right ventricular systolic function is normal. The right ventricular size is  normal. There is normal pulmonary artery systolic pressure.  3. The mitral valve is normal in structure. Mild mitral valve regurgitation.  4. The aortic valve is normal in structure. Aortic valve regurgitation is not visualized. FINDINGS  Left Ventricle: Left ventricular ejection fraction, by estimation, is 65 to 70%. The left ventricle has normal function. The left ventricle has no regional wall motion abnormalities. The left ventricular internal cavity size was normal in size. There is  moderate left ventricular hypertrophy. Left ventricular diastolic parameters are consistent with Grade I diastolic dysfunction (impaired relaxation). Right Ventricle: The right ventricular size is normal. No increase in right ventricular wall thickness. Right ventricular systolic function is normal. There is normal pulmonary artery systolic pressure. The tricuspid regurgitant velocity is 2.36 m/s, and  with an assumed right atrial pressure of 10 mmHg, the  estimated right ventricular systolic pressure is 16.0 mmHg. Left Atrium: Left atrial size was normal in size. Right Atrium: Right atrial size was normal in size. Pericardium: There is no evidence of pericardial effusion. Mitral Valve: The mitral valve is normal in structure. Mild mitral valve regurgitation. Tricuspid Valve: The tricuspid valve is normal in structure. Tricuspid valve regurgitation is trivial. Aortic Valve: The aortic valve is normal in structure. Aortic valve regurgitation is not visualized. Aortic valve mean gradient measures 3.5 mmHg. Aortic valve peak gradient measures 7.2 mmHg. Aortic valve area, by VTI measures 3.20 cm. Pulmonic Valve: The pulmonic valve was normal in structure. Pulmonic valve regurgitation is not visualized. Aorta: The aortic root and ascending aorta are structurally normal, with no evidence of dilitation. IAS/Shunts: No atrial level shunt detected by color flow Doppler.  LEFT VENTRICLE PLAX 2D LVIDd:         3.73 cm  Diastology LVIDs:         2.00 cm  LV e' lateral:   4.46 cm/s LV PW:         1.41 cm  LV E/e' lateral: 17.3 LV IVS:        2.19 cm  LV e' medial:    3.48 cm/s LVOT diam:     2.10 cm  LV E/e' medial:  22.2 LV SV:         74 LV SV Index:   38 LVOT Area:     3.46 cm  RIGHT VENTRICLE RV Basal diam:  3.21 cm RV S prime:     16.10 cm/s TAPSE (M-mode): 3.3 cm LEFT ATRIUM             Index       RIGHT ATRIUM           Index LA diam:        2.40 cm 1.23 cm/m  RA Area:     18.50 cm LA Vol (A2C):   62.1 ml 31.88 ml/m RA Volume:   53.50 ml  27.46 ml/m LA Vol (A4C):   59.1 ml 30.34 ml/m LA Biplane Vol: 61.1 ml 31.36 ml/m  AORTIC VALVE                   PULMONIC VALVE AV Area (Vmax):    2.60 cm    PV Vmax:        0.95 m/s AV Area (Vmean):   2.40 cm    PV Peak grad:   3.6 mmHg AV Area (VTI):     3.20 cm    RVOT Peak grad: 4 mmHg AV Vmax:           134.50 cm/s AV  Vmean:          85.850 cm/s AV VTI:            0.230 m AV Peak Grad:      7.2 mmHg AV Mean Grad:      3.5  mmHg LVOT Vmax:         101.00 cm/s LVOT Vmean:        59.500 cm/s LVOT VTI:          0.213 m LVOT/AV VTI ratio: 0.92  AORTA Ao Root diam: 3.10 cm MITRAL VALVE                TRICUSPID VALVE MV Area (PHT): 2.01 cm     TR Peak grad:   22.3 mmHg MV Decel Time: 377 msec     TR Vmax:        236.00 cm/s MV E velocity: 77.10 cm/s MV A velocity: 101.00 cm/s  SHUNTS MV E/A ratio:  0.76         Systemic VTI:  0.21 m                             Systemic Diam: 2.10 cm Arnoldo Hooker MD Electronically signed by Arnoldo Hooker MD Signature Date/Time: 05/22/2020/5:39:45 PM    Final      Scheduled Meds: . amLODipine  10 mg Oral Daily  . aspirin  81 mg Oral Daily  . atorvastatin  20 mg Oral Daily  . Chlorhexidine Gluconate Cloth  6 each Topical Q0600  . cholecalciferol  1,000 Units Oral Daily  . clopidogrel  75 mg Oral Daily  . enoxaparin (LOVENOX) injection  40 mg Subcutaneous Q24H  . furosemide  40 mg Intravenous Daily  . gabapentin  100 mg Oral BID  . Gerhardt's butt cream   Topical TID  . hydrALAZINE  25 mg Oral TID  . insulin aspart  0-9 Units Subcutaneous TID PC & HS  . insulin glargine  3 Units Subcutaneous QHS  . lacosamide  100 mg Oral BID  . magnesium oxide  400 mg Oral Daily  . mirtazapine  7.5 mg Oral QHS  . mupirocin ointment  1 application Nasal BID  . pantoprazole  40 mg Oral BID AC  . sodium chloride flush  3 mL Intravenous Q12H  . terazosin  5 mg Oral QHS  . vitamin B-12  1,000 mcg Oral Daily   Continuous Infusions: . sodium chloride    . cefTRIAXone (ROCEPHIN)  IV 1 g (05/23/20 1755)     LOS: 4 days   Time Spent in minutes   45 minutes  Albertine Grates MD PhD FACP on 05/24/2020 at 10:17 AM  Between 7am to 7pm - Please see pager noted on [email or web address]  After 7pm go to [email or web address]  And look for the night coverage person covering for me after hours  [facility] Office  [phone]

## 2020-05-24 NOTE — Progress Notes (Signed)
Inpatient Diabetes Program Recommendations  AACE/ADA: New Consensus Statement on Inpatient Glycemic Control   Target Ranges:  Prepandial:   less than 140 mg/dL      Peak postprandial:   less than 180 mg/dL (1-2 hours)      Critically ill patients:  140 - 180 mg/dL   Results for Blake Villarreal, Blake Villarreal (MRN [id number]) as of 05/24/2020 09:21  Ref. Range 05/23/2020 07:23 05/23/2020 12:04 05/23/2020 16:30 05/23/2020 20:39 05/24/2020 07:26  Glucose-Capillary Latest Ref Range: 70 - 99 mg/dL 373 (H) 668 (H) 159 (H) 240 (H) 200 (H)   Review of Glycemic Control  Diabetes history: DM2 Outpatient Diabetes medications: Lantus 5 units QHS, Regular 4-14 units TID with meals, Metformin 500 mg BID Current orders for Inpatient glycemic control: Lantus 3 units QHS, Novolog 0-9 units AC&HS  Inpatient Diabetes Program Recommendations:    Insulin-Basal: Please consider increasing Lantus to 5 units QHS.  Insulin-Meal Coverage: If patient is eating well, please consider ordering Novolog 3 units TID with meals for meal coverage if patient eats at least 50% of meals.  Thanks, Orlando Penner, RN, MSN, CDE Diabetes Coordinator Inpatient Diabetes Program [phone] (Team Pager from 8am to 5pm)

## 2020-05-25 DIAGNOSIS — J9602 Acute respiratory failure with hypercapnia: Secondary | ICD-10-CM

## 2020-05-25 LAB — BASIC METABOLIC PANEL
Anion gap: 11 (ref 5–15)
BUN: 34 mg/dL — ABNORMAL HIGH (ref 8–23)
CO2: 25 mmol/L (ref 22–32)
Calcium: 8.7 mg/dL — ABNORMAL LOW (ref 8.9–10.3)
Chloride: 98 mmol/L (ref 98–111)
Creatinine, Ser: 1.95 mg/dL — ABNORMAL HIGH (ref 0.61–1.24)
GFR calc Af Amer: 38 mL/min — ABNORMAL LOW (ref >60)
GFR calc non Af Amer: 33 mL/min — ABNORMAL LOW (ref >60)
Glucose, Bld: 254 mg/dL — ABNORMAL HIGH (ref 70–99)
Potassium: 4.2 mmol/L (ref 3.5–5.1)
Sodium: 134 mmol/L — ABNORMAL LOW (ref 135–145)

## 2020-05-25 LAB — CBC WITH DIFFERENTIAL/PLATELET
Abs Immature Granulocytes: 0.14 10*3/uL — ABNORMAL HIGH (ref 0.00–0.07)
Basophils Absolute: 0.1 10*3/uL (ref 0.0–0.1)
Basophils Relative: 1 %
Eosinophils Absolute: 0.5 10*3/uL (ref 0.0–0.5)
Eosinophils Relative: 3 %
HCT: 28.3 % — ABNORMAL LOW (ref 39.0–52.0)
Hemoglobin: 8.8 g/dL — ABNORMAL LOW (ref 13.0–17.0)
Immature Granulocytes: 1 %
Lymphocytes Relative: 26 %
Lymphs Abs: 3.8 10*3/uL (ref 0.7–4.0)
MCH: 27.1 pg (ref 26.0–34.0)
MCHC: 31.1 g/dL (ref 30.0–36.0)
MCV: 87.1 fL (ref 80.0–100.0)
Monocytes Absolute: 1.1 10*3/uL — ABNORMAL HIGH (ref 0.1–1.0)
Monocytes Relative: 8 %
Neutro Abs: 8.8 10*3/uL — ABNORMAL HIGH (ref 1.7–7.7)
Neutrophils Relative %: 61 %
Platelets: 496 10*3/uL — ABNORMAL HIGH (ref 150–400)
RBC: 3.25 MIL/uL — ABNORMAL LOW (ref 4.22–5.81)
RDW: 14.1 % (ref 11.5–15.5)
Smear Review: NORMAL
WBC: 14.4 10*3/uL — ABNORMAL HIGH (ref 4.0–10.5)
nRBC: 0 % (ref 0.0–0.2)

## 2020-05-25 LAB — GLUCOSE, CAPILLARY
Glucose-Capillary: 237 mg/dL — ABNORMAL HIGH (ref 70–99)
Glucose-Capillary: 233 mg/dL — ABNORMAL HIGH (ref 70–99)
Glucose-Capillary: 243 mg/dL — ABNORMAL HIGH (ref 70–99)

## 2020-05-25 LAB — SARS CORONAVIRUS 2 BY RT PCR (HOSPITAL ORDER, PERFORMED IN ~~LOC~~ HOSPITAL LAB): SARS Coronavirus 2: NEGATIVE

## 2020-05-25 MED ORDER — DOXYCYCLINE MONOHYDRATE 100 MG PO TABS: 100.0000 mg | ORAL_TABLET | Freq: Two times a day (BID) | ORAL | 0 refills | Status: AC

## 2020-05-25 MED ORDER — FUROSEMIDE 40 MG PO TABS: 40.0000 mg | ORAL_TABLET | ORAL | 0 refills | Status: DC

## 2020-05-25 NOTE — Progress Notes (Signed)
Patient discharged to [facility]. Legal guardian updated prior to pt leaving. Pt left via EMS. Telemetry and IV removed, report given to [facility]. EMS left with patient packet.

## 2020-05-25 NOTE — Discharge Summary (Addendum)
Discharge Summary  Blake Villarreal XIP:[id number] DOB: [birth year]  PCP: Keane Police, MD  Admit date: 05/20/2020 Discharge date: 05/25/2020  Time spent: , more than 50% time spent on coordination of care.   Recommendations for Outpatient Follow-up:  1. F/u with SNF MD within a week  for hospital discharge follow up, repeat cbc/bmp at follow up. Refer to hematology if persistent leukocytosis 2. F/u with wound care center for left lower extremity wound and sacral wound 3. New medication : doxycycline, lasix   Addendum: Per chart review patient was discharged on lacosamide  in 2018 due to possible seizure episode at [facility].  Today, I was told current SNF MD says patient does not take lacosamide anymore,  Lacosamide removed from home med list per SNF MD request.   Discharge Diagnoses:  Active Hospital Problems   Diagnosis Date Noted  . Acute CHF (HCC) 05/20/2020    Resolved Hospital Problems  No resolved problems to display.    Discharge Condition: stable  Diet recommendation: heart healthy/carb modified  Filed Weights   05/22/20 0416 05/24/20 0423 05/25/20 0335  Weight: 79 kg 79 kg 79.3 kg    History of present illness:  Blake Villarreal  is a 74 y.o. African-American male with a known history of type 2 diabetes mellitus, hypertension and peripheral artery disease, who presented to the emergency room with acute onset of altered mental status with decreased responsiveness and a blood glucose of 60 with improvement after being given IM glucagon however without initial significant improvement of his mental status.  During my interview he was much more alert and cooperative and oriented x3.  He has been having no significant cough or wheezing or dyspnea however in the ER he dropped his pulse 70-80 7-89% on room air and that went up to.  He does have left lower extremity edema with chronic wounds.  No fever or chills.  He denied any nausea or vomiting or abdominal pain.  No  chest pain or palpitations.  Upon presentation to the emergency room, blood pressure was 166/56 and otherwise vital signs were within normal.  Labs revealed sodium of 134 potassium 3.5, BUN of 36 and creatinine of 2.08 compared to 34/1.78 last month.  High-sensitivity troponin I was 19.  CBC showed leukocytosis 28.9 with neutrophilia and anemia close to baseline as well as thrombocytosis. EKG showed normal sinus rhythm with rate of 61 with poor R wave progression and probable left atrial enlargement and left axis deviation.  Chest x-ray showed mild diffuse interstitial pulmonary opacity likely edema in the setting of cardiomegaly with no focal airspace opacity.  The patient was given IV Rocephin and vancomycin as well as 500 mill IV normal saline.  He will be admitted to a progressive unit bed for further evaluation and management.  Hospital Course:  Active Problems:   Acute CHF (HCC)  Acute metabolic encephalopathy likely due to hypoglycemic encephalopathy with diabetes mellitus, type II and possibly sepsis from wound infection ---UA and chest x-ray unremarkable for infection -possible Wound infection, patient was started on IV Rocephin for possibility of nonpurulent moderate cellulitis left leg, blood culture no growth, he is discharged on doxycycline for 3 more days to finish antibiotic treatment.  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  Insulin-dependent type 2 diabetes, uncontrolled, with hyperglycemia and hypoglycemia Metformin has been  held, discontinued at discharge due to abnormal renal function Lantus was held initially due to hypoglycemia -Blood glucose start to trend up, increase Lantus, add meal coverage, continue SSI  Acute pulmonary edema likely secondary to acute on chronic diastolic heart  failure/mild acute hypoxic respiratory failure -Echocardiogram in 2018 revealed an EF of 70%, grade 1 diastolic dysfunction with mild mitral regurgitation, mild left and right atrial dilatation and severe right ventricular dilatation -Was treated with IV Lasix 40 mg twice daily then once a day, he is discharged on Lasix every other day, continue monitor volume status and renal function, further Lasix dose and frequency adjustment per PCP -Do not see any diuretics on patient's home med list --Checks x-ray showed improving volumes and aeration of the lungs -improving, he is on room air -baseline wheelchair bound  Acute kidney injury on chronic kidney disease, stage IIIb -Renal ultrasound no acute findings -Patient has been diuresing on IV Lasix -cr 2 on admission, nadir at 1.68, trend up on Lasix , lasix dose reduced at discharge -PCP continue monitor renal function, renal dosing medication  Normocytic anemia/anemia of chronic kidney disease -Hemoglobin currently stable, 8.9 -Anemia panel showed iron of 25, U IBC 156, TIBC 181, saturation ratio 14, ferritin 46, folate 6.03, B12 2000  -Continue to monitor H&H  Hypertension -BP stable, continue home medication amlodipine, hydralazine, terazosin ,Lasix   Dyslipidemia -Continue statin  Peripheral neuropathy -Continue Neurontin  Peripheral Arterial Disease/history of right AKA -Continue aspirin, Plavix, statin  GERD -Continue PPI  Wounds Pressure Injury 08/15/17 Stage II - Partial thickness loss of dermis presenting as a shallow open ulcer with a red, pink wound bed without slough. (Active)  08/15/17 2334  Location: Buttocks  Location Orientation: Left  Staging: Stage II - Partial thickness loss of dermis presenting as a shallow open ulcer with a red, pink wound bed without slough.  Wound Description (Comments):   Present on Admission:  -Continue with Gerhardt's Butt cream -With antibiotics and likely can switch to  p.o. 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 Cover with silver hydrofiber (Aquacel Advantage, Lawson # [id number]), top with dry gauze 4x4 and top with silicone foam dressing. Change daily and PRN soiling.    Left LE wound presents on admission:  Cleanse with soap and water, rinse and pat dry. Place an ABD pad at the anterior and posterior LE, wrap with Kerlix roll gauze from just below toes to just below knee and secure with paper tape.  Place foot into [employer].    Code Status: Full  Family Communication: patient  Disposition Plan:  Dispo: The patient is from: SNF  Anticipated d/c is to: SNF    Procedures:  None  Consultations:  Wound care  Discharge Exam: BP 138/60 (BP Location: Left Arm)   Pulse 72   Temp 98.4 F (36.9 C)   Resp 18   Ht  (1.753 m)   Wt 79.3 kg   SpO2 100%   BMI 25.83 kg/m   General: NAD, pleasant Cardiovascular: RRR Respiratory: Clear to auscultation bilaterally  Discharge Instructions You were cared for by a hospitalist during your hospital stay. If you have any questions about your discharge medications or the care you received while you were in the hospital after you are discharged, you can call the unit and asked to speak with the hospitalist on call if  the hospitalist that took care of you is not available. Once you are discharged, your primary care physician will handle any further medical issues. Please note that NO REFILLS for any discharge medications will be authorized once you are discharged, as it is imperative that you return to your primary care physician (or establish a relationship with a primary care physician if you do not have one) for your aftercare needs so that they can reassess your need for medications and monitor your lab values.  Discharge Instructions    Diet -  low sodium heart healthy   Complete by: As directed    Discharge wound care:   Complete by: As directed    right ischial tuberosity pressure injury (Stage 3), presents on admission:  Cleanse with NS, apt gently dry. Cover with silver hydrofiber (Aquacel Advantage, Lawson # [id number]), top with dry gauze 4x4 and top with silicone foam dressing. Change daily and PRN soiling.    Left LE wound presents on admission:  Cleanse with soap and water, rinse and pat dry. Place an ABD pad at the anterior and posterior LE, wrap with Kerlix roll gauze from just below toes to just below knee and secure with paper tape.  Place foot into [employer].   Increase activity slowly   Complete by: As directed      Allergies as of 05/25/2020      Reactions   Daptomycin    Iodinated Diagnostic Agents       Medication List    STOP taking these medications   Lacosamide 100 MG Tabs   lisinopril 40 MG tablet Commonly known as: ZESTRIL   metFORMIN 500 MG tablet Commonly known as: GLUCOPHAGE   predniSONE 10 MG (21) Tbpk tablet Commonly known as: STERAPRED UNI-PAK 21 TAB     TAKE these medications   acetaminophen 325 MG tablet Commonly known as: TYLENOL Take 650 mg by mouth every 6 (six) hours as needed.   albuterol (2.5 MG/3ML) 0.083% nebulizer solution Commonly known as: PROVENTIL Take 2 ampules by nebulization every 6 (six) hours as needed for wheezing.   amLODipine 10 MG tablet Commonly known as: NORVASC Take 10 mg by mouth daily.   aspirin 81 MG chewable tablet Chew 81 mg by mouth daily.   atorvastatin 20 MG tablet Commonly known as: LIPITOR Take 20 mg by mouth daily.   clopidogrel 75 MG tablet Commonly known as: PLAVIX Take 75 mg by mouth daily.   docusate sodium 100 MG capsule Commonly known as: COLACE Take 100 mg by mouth daily as needed.   doxycycline 100 MG tablet Commonly known as: ADOXA Take 1 tablet (100 mg total) by mouth 2 (two) times daily for 3 days.   furosemide 40  MG tablet Commonly known as: Lasix Take 1 tablet (40 mg total) by mouth every other day.   gabapentin 100 MG capsule Commonly known as: NEURONTIN Take 100 mg by mouth 3 (three) times daily.   hydrALAZINE 25 MG tablet Commonly known as: APRESOLINE Take 1 tablet (25 mg total) by mouth 3 (three) times daily.   hydrocerin Crea Apply 1 application topically 2 (two) times daily.   insulin glargine 100 UNIT/ML injection Commonly known as: LANTUS Inject 5 Units into the skin at bedtime.   insulin regular 100 units/mL injection Commonly known as: NOVOLIN R Inject 4-14 Units into the skin 3 (three) times daily before meals. 0-149 = 0 units, 150-200 = 4 units, 201-250 = 7 units, 251-300 = 9 units, 301-350 = 12  units, 351-400 = 14 units, call MD > 400.   magnesium oxide 400 MG tablet Commonly known as: MAG-OX Take 400 mg by mouth daily.   mirtazapine 7.5 MG tablet Commonly known as: REMERON Take 7.5 mg by mouth at bedtime.   pantoprazole 40 MG tablet Commonly known as: PROTONIX Take 1 tablet (40 mg total) by mouth 2 (two) times daily before a meal.   terazosin 5 MG capsule Commonly known as: HYTRIN Take 5 mg by mouth at bedtime.   vitamin B-12 1000 MCG tablet Commonly known as: CYANOCOBALAMIN Take 1,000 mcg by mouth daily.   Vitamin D3 50 MCG (2000 UT) Tabs Take 1 tablet by mouth daily.            Discharge Care Instructions  (From admission, onward)         Start     Ordered   05/25/20 0000  Discharge wound care:       Comments: right ischial tuberosity pressure injury (Stage 3), presents on admission:  Cleanse with NS, apt gently dry. Cover with silver hydrofiber (Aquacel Advantage, Lawson # [id number]), top with dry gauze 4x4 and top with silicone foam dressing. Change daily and PRN soiling.    Left LE wound presents on admission:  Cleanse with soap and water, rinse and pat dry. Place an ABD pad at the anterior and posterior LE, wrap with Kerlix roll gauze from just  below toes to just below knee and secure with paper tape.  Place foot into [employer].   05/25/20 1327         Allergies  Allergen Reactions  . Daptomycin   . Iodinated Diagnostic Agents     Contact information for follow-up providers    [facility] Follow up on 05/30/2020.   Specialty: Cardiology Why: at 10:00am. Enter through the Medical Mall entrance Contact information: [address] [place] [address] [phone]       [facility] Follow up in 1 week(s).   Specialty: Wound Care Why: left lower extremity wound  Contact information: [address] [id number] ar [phone]           Contact information for after-discharge care    Destination    [facility] .   Service: Skilled Nursing Contact information: [address] [place] [address] [phone]                   The results of significant diagnostics from this hospitalization (including imaging, microbiology, ancillary and laboratory) are listed below for reference.    Significant Diagnostic Studies: DG Chest 1 View  Result Date: 05/23/2020 CLINICAL DATA:  Shortness of breath. EXAM: CHEST  1 VIEW COMPARISON:  Prior chest radiographs 05/22/2020 and earlier FINDINGS: Unchanged cardiomegaly. Aortic atherosclerosis. Persistent subtle interstitial prominence within both lungs, which may reflect mild interstitial edema. No evidence of airspace consolidation. Redemonstrated scarring within the left lung base. No evidence of pleural effusion or pneumothorax. No acute bony abnormality identified. IMPRESSION: Persistent subtle interstitial prominence within both lungs, which is nonspecific but may reflect mild interstitial edema. Redemonstrated scarring within the left lung base. Unchanged cardiomegaly. Aortic Atherosclerosis (ICD10-I70.0).  Electronically Signed   By: Jackey Loge DO   On: 05/23/2020 08:49   DG Chest 1 View  Result Date: 05/22/2020 CLINICAL DATA:  Shortness of breath EXAM: CHEST  1 VIEW COMPARISON:  Radiograph 05/20/2020, CT 08/18/2008 FINDINGS: Improving  volumes and aeration of the lungs with diminishing opacities. Stable cardiomegaly with a calcified aorta. No visible pneumothorax or effusion. Abundant mediastinal fat similar to comparison CT. No acute osseous or soft tissue abnormality. Degenerative changes are present in the imaged spine and shoulders. Telemetry leads overlie the chest. IMPRESSION: Improving volumes and aeration of the lungs with diminishing opacities likely reflecting resolving atelectasis and/or improving edema. Electronically Signed   By: Kreg Shropshire M.D.   On: 05/22/2020 05:49   US RENAL  Result Date: 05/24/2020 CLINICAL DATA:  Elevated creatinine. EXAM: RENAL / URINARY TRACT ULTRASOUND COMPLETE COMPARISON:  Abdominal CT 05/30/2017 FINDINGS: Right Kidney: Renal measurements: 10.5 x 4.4 x 5.3 cm = volume: 127 mL. Borderline increased renal echogenicity. No mass or hydronephrosis visualized. Left Kidney: Renal measurements: 11.3 x 4.9 x 4.9 cm = volume: 143 mL. Borderline increased renal echogenicity. No mass or hydronephrosis visualized. Bladder: Appears normal for degree of bladder distention. Other: None. IMPRESSION: No obstructive uropathy or hydronephrosis. Borderline increased renal echogenicity suggesting chronic medical renal disease. Electronically Signed   By: Narda Rutherford M.D.   On: 05/24/2020 14:38   DG Chest Portable 1 View  Result Date: 05/20/2020 CLINICAL DATA:  Weakness EXAM: PORTABLE CHEST 1 VIEW COMPARISON:  05/04/2019 FINDINGS: Cardiomegaly. Mild, diffuse interstitial pulmonary opacity. The visualized skeletal structures are unremarkable. IMPRESSION: Mild, diffuse interstitial pulmonary opacity, likely edema in the setting of cardiomegaly. No focal airspace opacity. Electronically  Signed   By: Lauralyn Primes M.D.   On: 05/20/2020 13:24   ECHOCARDIOGRAM COMPLETE  Result Date: 05/22/2020    ECHOCARDIOGRAM REPORT   Patient Name:   MILANO ROSEVEAR Date of Exam: 05/22/2020 Medical Rec #:  [id number]       Height:       69.0 in Accession #:    [id number]      Weight:       174.2 lb Date of Birth:  23-Jul-1946      BSA:          1.948 m Patient Age:    73 years        BP:           154/62 mmHg Patient Gender: M               HR:           64 bpm. Exam Location:  [facility] Procedure: 2D Echo, Cardiac Doppler and Color Doppler Indications:     CHF-acute diastolic 428.31  History:         Patient has prior history of Echocardiogram examinations, most                  recent 08/15/2017. Risk Factors:Hypertension and Diabetes. PAD.  Sonographer:     Cristela Blue RDCS (AE) Referring Phys:  [id number] Vernetta Honey MANSY Diagnosing Phys: Arnoldo Hooker MD IMPRESSIONS  1. Left ventricular ejection fraction, by estimation, is 65 to 70%. The left ventricle has normal function. The left ventricle has no regional wall motion abnormalities. There is moderate left ventricular hypertrophy. Left ventricular diastolic parameters are consistent with Grade I diastolic dysfunction (impaired relaxation).  2. Right ventricular systolic function is normal. The right ventricular size is normal. There is normal pulmonary artery systolic pressure.  3. The mitral valve is normal in structure. Mild mitral valve regurgitation.  4. The aortic valve is normal in structure. Aortic valve regurgitation is not visualized. FINDINGS  Left Ventricle: Left ventricular ejection fraction, by estimation, is 65 to 70%. The  left ventricle has normal function. The left ventricle has no regional wall motion abnormalities. The left ventricular internal cavity size was normal in size. There is  moderate left ventricular hypertrophy. Left ventricular diastolic parameters are consistent with Grade I diastolic dysfunction (impaired relaxation). Right Ventricle: The  right ventricular size is normal. No increase in right ventricular wall thickness. Right ventricular systolic function is normal. There is normal pulmonary artery systolic pressure. The tricuspid regurgitant velocity is 2.36 m/s, and  with an assumed right atrial pressure of 10 mmHg, the estimated right ventricular systolic pressure is 32.3 mmHg. Left Atrium: Left atrial size was normal in size. Right Atrium: Right atrial size was normal in size. Pericardium: There is no evidence of pericardial effusion. Mitral Valve: The mitral valve is normal in structure. Mild mitral valve regurgitation. Tricuspid Valve: The tricuspid valve is normal in structure. Tricuspid valve regurgitation is trivial. Aortic Valve: The aortic valve is normal in structure. Aortic valve regurgitation is not visualized. Aortic valve mean gradient measures 3.5 mmHg. Aortic valve peak gradient measures 7.2 mmHg. Aortic valve area, by VTI measures 3.20 cm. Pulmonic Valve: The pulmonic valve was normal in structure. Pulmonic valve regurgitation is not visualized. Aorta: The aortic root and ascending aorta are structurally normal, with no evidence of dilitation. IAS/Shunts: No atrial level shunt detected by color flow Doppler.  LEFT VENTRICLE PLAX 2D LVIDd:         3.73 cm  Diastology LVIDs:         2.00 cm  LV e' lateral:   4.46 cm/s LV PW:         1.41 cm  LV E/e' lateral: 17.3 LV IVS:        2.19 cm  LV e' medial:    3.48 cm/s LVOT diam:     2.10 cm  LV E/e' medial:  22.2 LV SV:         74 LV SV Index:   38 LVOT Area:     3.46 cm  RIGHT VENTRICLE RV Basal diam:  3.21 cm RV S prime:     16.10 cm/s TAPSE (M-mode): 3.3 cm LEFT ATRIUM             Index       RIGHT ATRIUM           Index LA diam:        2.40 cm 1.23 cm/m  RA Area:     18.50 cm LA Vol (A2C):   62.1 ml 31.88 ml/m RA Volume:   53.50 ml  27.46 ml/m LA Vol (A4C):   59.1 ml 30.34 ml/m LA Biplane Vol: 61.1 ml 31.36 ml/m  AORTIC VALVE                   PULMONIC VALVE AV Area (Vmax):     2.60 cm    PV Vmax:        0.95 m/s AV Area (Vmean):   2.40 cm    PV Peak grad:   3.6 mmHg AV Area (VTI):     3.20 cm    RVOT Peak grad: 4 mmHg AV Vmax:           134.50 cm/s AV Vmean:          85.850 cm/s AV VTI:            0.230 m AV Peak Grad:      7.2 mmHg AV Mean Grad:      3.5 mmHg LVOT Vmax:  101.00 cm/s LVOT Vmean:        59.500 cm/s LVOT VTI:          0.213 m LVOT/AV VTI ratio: 0.92  AORTA Ao Root diam: 3.10 cm MITRAL VALVE                TRICUSPID VALVE MV Area (PHT): 2.01 cm     TR Peak grad:   22.3 mmHg MV Decel Time: 377 msec     TR Vmax:        236.00 cm/s MV E velocity: 77.10 cm/s MV A velocity: 101.00 cm/s  SHUNTS MV E/A ratio:  0.76         Systemic VTI:  0.21 m                             Systemic Diam: 2.10 cm Serafina Royals MD Electronically signed by Serafina Royals MD Signature Date/Time: 05/22/2020/5:39:45 PM    Final     Microbiology: Recent Results (from the past 240 hour(s))  SARS Coronavirus 2 by RT PCR (hospital order, performed in [place] hospital lab) Nasopharyngeal Nasopharyngeal Swab     Status: None   Collection Time: 05/20/20  3:10 PM   Specimen: Nasopharyngeal Swab  Result Value Ref Range Status   SARS Coronavirus 2 NEGATIVE NEGATIVE Final    Comment: (NOTE) SARS-CoV-2 target nucleic acids are NOT DETECTED. The SARS-CoV-2 RNA is generally detectable in upper and lower respiratory specimens during the acute phase of infection. The lowest concentration of SARS-CoV-2 viral copies this assay can detect is 250 copies / mL. A negative result does not preclude SARS-CoV-2 infection and should not be used as the sole basis for treatment or other patient management decisions.  A negative result may occur with improper specimen collection / handling, submission of specimen other than nasopharyngeal swab, presence of viral mutation(s) within the areas targeted by this assay, and inadequate number of viral copies (<250 copies / mL). A negative result must be  combined with clinical observations, patient history, and epidemiological information. Fact Sheet for Patients:   StrictlyIdeas.no Fact Sheet for Healthcare Providers: BankingDealers.co.za This test is not yet approved or cleared  by the [place] FDA and has been authorized for detection and/or diagnosis of SARS-CoV-2 by FDA under an Emergency Use Authorization (EUA).  This EUA will remain in effect (meaning this test can be used) for the duration of the COVID-19 declaration under Section 564(b)(1) of the Act, 21 U.S.C. section 360bbb-3(b)(1), unless the authorization is terminated or revoked sooner. Performed at [facility], [address] [place], [place] [address]   MRSA PCR Screening     Status: Abnormal   Collection Time: 05/20/20 11:24 PM   Specimen: Nasal Mucosa; Nasopharyngeal  Result Value Ref Range Status   MRSA by PCR POSITIVE (A) NEGATIVE Final    Comment:        The GeneXpert MRSA Assay (FDA approved for NASAL specimens only), is one component of a comprehensive MRSA colonization surveillance program. It is not intended to diagnose MRSA infection nor to guide or monitor treatment for MRSA infections. RESULT CALLED TO, READ BACK BY AND VERIFIED WITH: MARSHA TURNER 05/21/20 AT 0153 HS Performed at [facility], [address] [place], [place] [address]   Culture, blood (routine x 2)     Status: None (Preliminary result)   Collection Time: 05/21/20  1:46 AM   Specimen: BLOOD  Result Value Ref Range  Status   Specimen Description BLOOD RIGHT ASSIST CONTROL  Final   Special Requests   Final    BOTTLES DRAWN AEROBIC AND ANAEROBIC Blood Culture results may not be optimal due to an inadequate volume of blood received in culture bottles   Culture   Final    NO GROWTH 4 DAYS Performed at [facility], [address], [place], [place] [address]    Report Status PENDING  Incomplete   Culture, blood (routine x 2)     Status: None (Preliminary result)   Collection Time: 05/21/20  1:56 AM   Specimen: BLOOD  Result Value Ref Range Status   Specimen Description BLOOD LEFT HAND  Final   Special Requests   Final    BOTTLES DRAWN AEROBIC ONLY Blood Culture adequate volume   Culture   Final    NO GROWTH 4 DAYS Performed at [facility], [address] [place], [place] [address]    Report Status PENDING  Incomplete  SARS Coronavirus 2 by RT PCR (hospital order, performed in [facility] hospital lab) Nasopharyngeal Nasopharyngeal Swab     Status: None   Collection Time: 05/25/20  3:29 PM   Specimen: Nasopharyngeal Swab  Result Value Ref Range Status   SARS Coronavirus 2 NEGATIVE NEGATIVE Final    Comment: (NOTE) SARS-CoV-2 target nucleic acids are NOT DETECTED.  The SARS-CoV-2 RNA is generally detectable in upper and lower respiratory specimens during the acute phase of infection. The lowest concentration of SARS-CoV-2 viral copies this assay can detect is 250 copies / mL. A negative result does not preclude SARS-CoV-2 infection and should not be used as the sole basis for treatment or other patient management decisions.  A negative result may occur with improper specimen collection / handling, submission of specimen other than nasopharyngeal swab, presence of viral mutation(s) within the areas targeted by this assay, and inadequate number of viral copies (<250 copies / mL). A negative result must be combined with clinical observations, patient history, and epidemiological information.  Fact Sheet for Patients:   BoilerBrush.com.cy  Fact Sheet for Healthcare Providers: https://pope.com/  This test is not yet approved or  cleared by the [place] FDA and has been authorized for detection and/or diagnosis of SARS-CoV-2 by FDA under an Emergency Use Authorization (EUA).  This EUA will remain in effect  (meaning this test can be used) for the duration of the COVID-19 declaration under Section 564(b)(1) of the Act, 21 U.S.C. section 360bbb-3(b)(1), unless the authorization is terminated or revoked sooner.  Performed at [facility], [address] [place], [place] [address]      Labs: Basic Metabolic Panel: Recent Labs  Lab 05/20/20 1219 05/20/20 2347 05/21/20 0146 05/22/20 0339 05/23/20 0439 05/24/20 0546 05/25/20 0413  NA   < >  --  137 137 136 136 134*  K   < >  --  4.0 3.7 3.7 3.9 4.2  CL   < >  --  104 103 101 101 98  CO2   < >  --  GLUCOSE   < >  --  77 222* 163* 220* 254*  BUN   < >  --  35* 36* 34* 33* 34*  CREATININE   < > 2.01* 1.95* 1.94* 1.72* 1.68* 1.95*  CALCIUM   < >  --  8.8* 8.5* 8.6* 8.5* 8.7*  MG  --  2.2  --  1.9 1.7  --   --  PHOS  --   --   --  4.0 3.5  --   --    < > = values in this interval not displayed.   Liver Function Tests: Recent Labs  Lab 05/20/20 1219 05/22/20 0339 05/23/20 0439  AST 18 14* 12*  ALT 12 12 10   ALKPHOS 68 74 66  BILITOT 0.7 0.6 0.5  PROT 8.1 7.2 7.4  ALBUMIN 3.5 3.0* 3.0*   No results for input(s): LIPASE, AMYLASE in the last 168 hours. No results for input(s): AMMONIA in the last 168 hours. CBC: Recent Labs  Lab 05/21/20 0146 05/22/20 0339 05/23/20 0439 05/24/20 0546 05/25/20 0413  WBC 17.6* 11.8* 12.1* 13.3* 14.4*  NEUTROABS 12.6* 7.4 7.7 8.6* 8.8*  HGB 8.9* 8.5* 8.9* 8.9* 8.8*  HCT 28.2* 26.9* 28.5* 27.9* 28.3*  MCV 87.9 86.8 87.4 85.3 87.1  PLT 462* 459* 465* 468* 496*   Cardiac Enzymes: No results for input(s): CKTOTAL, CKMB, CKMBINDEX, TROPONINI in the last 168 hours. BNP: BNP (last 3 results) Recent Labs    05/20/20 2347  BNP 106.1*    ProBNP (last 3 results) No results for input(s): PROBNP in the last 8760 hours.  CBG: Recent Labs  Lab 05/24/20 1602 05/24/20 2216 05/25/20 0750 05/25/20 1146 05/25/20 1643  GLUCAP 333* 320* 237* 233* 243*        Signed:  Albertine Grates MD, PhD, FACP  [facility] 05/25/2020, 5:01 PM

## 2020-05-25 NOTE — TOC Transition Note (Addendum)
Transition of Care [facility]) - CM/SW Discharge Note   Patient Details  Name: Blake Villarreal MRN: [id number] Date of Birth: Sep 13, 1946  Transition of Care [facility]) CM/SW Contact:  Maree Krabbe, LCSW Phone Number: 05/25/2020, 4:08 PM   Clinical Narrative:   Clinical Social Worker facilitated patient discharge including contacting patient family and facility to confirm patient discharge plans.  Clinical information faxed to facility and family agreeable with plan.  CSW arranged ambulance transport via [facility] (scheduled for 6 pending COVID results, discussed with RN) .  RN to call [phone] for report prior to discharge.  Waiting on pt's covid results, if results are not back by 5:45 RN to call Berna Spare at [facility] and cancel or push back transport-- Marcus: [phone].  Final next level of care: Skilled Nursing Facility Barriers to Discharge: No Barriers Identified   Patient Goals and CMS Choice        Discharge Placement              Patient [facility] bed at: [facility] Patient to be transferred to facility by: [facility] Name of family member notified: daughter Patient and family notified of of transfer: 05/25/20  Discharge Plan and Services In-house Referral: Clinical Social Work   Post Acute Care Choice: Skilled Nursing Facility                               Social Determinants of Health (SDOH) Interventions     Readmission Risk Interventions No flowsheet data found.

## 2020-05-26 ENCOUNTER — Telehealth: Payer: Self-pay | Admitting: Family

## 2020-05-26 LAB — CULTURE, BLOOD (ROUTINE X 2)
Culture: NO GROWTH
Culture: NO GROWTH
Special Requests: ADEQUATE

## 2020-05-30 ENCOUNTER — Ambulatory Visit: Payer: Medicare Other | Admitting: Family

## 2020-06-16 ENCOUNTER — Ambulatory Visit: Payer: No Typology Code available for payment source | Admitting: Physician Assistant

## 2020-06-22 NOTE — Progress Notes (Signed)
Patient ID: Blake Villarreal, male    DOB: 1946/08/12, 74 y.o.   MRN: [id number]  HPI  Blake Villarreal is a 74 y/o male with a history of DM, HTN, PAD, current tobacco use and chronic heart failure.  Echo report from 05/22/20 reviewed and showed an EF of 65-70% along with moderate LVH and mild Blake.   Admitted 05/20/20 due to acute metabolic encephalopathy along with acute heart failure. Antibiotics given for leg cellulitis. Wound consult obtained. Initially needed IV lasix and then transitioned to oral diuretics. Renal ultrasound without acute findings. Discharged after 5 days to SNF.   He presents today for his initial visit with a chief complaint of minimal fatigue upon moderate exertion. He describes this as chronic in nature having been present for several years. He has associated difficulty sleeping and back pain along with this. He denies any dizziness, abdominal distention, palpitations, pedal edema, chest pain, shortness of breath, cough or weight gain.   Currently living at [facility] and says that he gets weighed but not daily. 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 He is able to get around in his electric wheelchair.     Past Medical History:  Diagnosis Date  . CHF (congestive heart failure) (HCC)   . Diabetes mellitus without complication (HCC)   . Hypertension   . Intermittent confusion   . PAD (peripheral artery disease) (HCC)    Past Surgical History:  Procedure Laterality Date  . ABOVE KNEE LEG AMPUTATION    . LEG AMPUTATION ABOVE KNEE Right    PREVIOUS SURGERY   Family History  Problem Relation Age of Onset  . CAD Neg Hx    Social History   Tobacco Use  . Smoking status: Current Every Day Smoker  . Smokeless tobacco: Never Used  Substance Use Topics  . Alcohol use: No    Comment: Drank heavily in the past per his cousin   Allergies  Allergen Reactions  . Daptomycin   . Iodinated Diagnostic Agents    Prior to Admission medications    Medication Sig Start Date End Date Taking? Authorizing Provider  acetaminophen (TYLENOL) 325 MG tablet Take 650 mg by mouth every 6 (six) hours as needed.   Yes [provider]  amLODipine (NORVASC) 10 MG tablet Take 10 mg by mouth daily.   Yes [provider]  aspirin 81 MG chewable tablet Chew 81 mg by mouth daily.   Yes [provider]  atorvastatin (LIPITOR) 20 MG tablet Take 20 mg by mouth daily.   Yes [provider]  clopidogrel (PLAVIX) 75 MG tablet Take 75 mg by mouth daily.   Yes [provider]  docusate sodium (COLACE) 100 MG capsule Take 100 mg by mouth daily as needed.    Yes [provider]  doxycycline (VIBRAMYCIN) 100 MG capsule Take 100 mg by mouth 2 (two) times daily.   Yes [provider]  Dulaglutide (TRULICITY) 1.5 MG/0.5ML SOPN Inject 1.5 mg into the skin once a week.   Yes [provider]  furosemide (LASIX) 40 MG tablet Take 1 tablet (40 mg total) by mouth every other day. 05/25/20 06/24/20 Yes Albertine Grates, MD  gabapentin (NEURONTIN) 100 MG capsule Take 100 mg by mouth 3 (three) times daily.   Yes [provider]  hydrALAZINE (APRESOLINE) 25 MG tablet Take 1 tablet (25 mg total) by mouth 3 (three) times daily. 08/22/17  Yes Delfino Lovett, MD  hydrocerin Jenetta Downer)  CREA Apply 1 application topically 2 (two) times daily.   Yes [provider]  insulin glargine (LANTUS) 100 UNIT/ML injection Inject 45 Units into the skin at bedtime.    Yes [provider]  insulin regular (NOVOLIN R,HUMULIN R) 100 units/mL injection Inject 4-14 Units into the skin 3 (three) times daily before meals. 0-149 = 0 units, 150-200 = 4 units, 201-250 = 7 units, 251-300 = 9 units, 301-350 = 12 units, 351-400 = 14 units, call MD > 400.   Yes [provider]  magnesium oxide (MAG-OX) 400 MG tablet Take 400 mg by mouth daily.    Yes [provider]  mirtazapine (REMERON) 7.5 MG tablet Take 7.5 mg by  mouth at bedtime.   Yes [provider]  nicotine (NICODERM CQ - DOSED IN MG/24 HOURS) 14 mg/24hr patch Place 14 mg onto the skin daily.   Yes [provider]  pantoprazole (PROTONIX) 40 MG tablet Take 1 tablet (40 mg total) by mouth 2 (two) times daily before a meal. 08/22/17  Yes Delfino Lovett, MD  terazosin (HYTRIN) 5 MG capsule Take 5 mg by mouth at bedtime.   Yes [provider]  traMADol (ULTRAM) 50 MG tablet Take 50 mg by mouth every 6 (six) hours as needed.   Yes [provider]  vitamin B-12 (CYANOCOBALAMIN) 1000 MCG tablet Take 1,000 mcg by mouth daily.   Yes [provider]  albuterol (PROVENTIL) (2.5 MG/3ML) 0.083% nebulizer solution Take 2 ampules by nebulization every 6 (six) hours as needed for wheezing.    [provider]   Review of Systems  Constitutional: Positive for fatigue. Negative for appetite change.  HENT: Negative for congestion, postnasal drip and sore throat.   Eyes: Negative.   Respiratory: Negative for cough and shortness of breath.   Cardiovascular: Negative for chest pain, palpitations and leg swelling.  Gastrointestinal: Negative for abdominal distention and abdominal pain.  Endocrine: Negative.   Genitourinary: Negative.   Musculoskeletal: Positive for back pain. Negative for neck pain.  Skin: Positive for wound (left lower leg).  Allergic/Immunologic: Negative.   Neurological: Negative for dizziness and light-headedness.  Hematological: Negative for adenopathy. Does not bruise/bleed easily.  Psychiatric/Behavioral: Positive for sleep disturbance (due to pain in back from his bed). Negative for dysphoric mood. The patient is not nervous/anxious.    Vitals:   06/23/20 0907  BP: (!) 120/54  Pulse: 73  Resp: 18  SpO2: 100%  Weight: 160 lb (72.6 kg)  Height: 5\' 9"  (1.753 m)   Wt Readings from Last 3 Encounters:  06/23/20 160 lb (72.6 kg)  05/25/20 174 lb 14.4 oz (79.3 kg)  05/04/19 180 lb (81.6 kg)    Lab Results  Component Value Date   CREATININE 1.95 (H) 05/25/2020   CREATININE 1.68 (H) 05/24/2020   CREATININE 1.72 (H) 05/23/2020    Physical Exam Vitals and nursing note reviewed.  Constitutional:      Appearance: Normal appearance.  HENT:     Head: Normocephalic and atraumatic.  Cardiovascular:     Rate and Rhythm: Normal rate and regular rhythm.  Pulmonary:     Effort: Pulmonary effort is normal. No respiratory distress.     Breath sounds: No wheezing or rales.  Abdominal:     General: There is no distension.     Palpations: Abdomen is soft.     Tenderness: There is no abdominal tenderness.  Musculoskeletal:        General: Deformity (right AKA) present.  Cervical back: Normal range of motion and neck supple.  Skin:    General: Skin is warm and dry.     Comments: Left lower leg wrapped in boot  Neurological:     General: No focal deficit present.     Mental Status: He is alert and oriented to person, place, and time.  Psychiatric:        Mood and Affect: Mood normal.        Behavior: Behavior normal.     Assessment & Plan:  1: Chronic heart failure with preserved ejection fraction along with structural changes (LVH)- - NYHA class II - euvolemic today - order written for him to be weighed daily and for facility to call for an overnight weight gain of >2 pounds or a weekly weight gain of >5 pounds - does add "some" salt to his food; encouraged him to not add any salt to his food if possible but that he can use as much pepper as he'd like - mobility is quite limited due to right AKA and left lower leg in a boot; does get around with electric wheel chair - BNP 05/20/20 was 106.1 - continues to smoke but he doesn't really know how much he smokes; says that it's probably < 1/2 ppd of day because he has to go outside each time to smoke  2: HTN- - BP looks good today - currently seeing PCP at the facility - BMP 05/25/20 reviewed and showed sodium 134, potassium  4.2, creatinine 1.95 and GFR 38  3: DM- - states that his glucose this morning at the facility was 169   Facility medication list was reviewed.   Return in 2 months or sooner for any questions/problems before then.  - A1c 05/20/20 was 9.8%

## 2020-06-23 ENCOUNTER — Ambulatory Visit: Payer: Medicare Other | Attending: Family | Admitting: Family

## 2020-06-23 ENCOUNTER — Encounter: Payer: Self-pay | Admitting: Family

## 2020-06-23 ENCOUNTER — Other Ambulatory Visit: Payer: Self-pay

## 2020-06-23 VITALS — BP 120/54 | HR 73 | Resp 18 | Ht 69.0 in | Wt 160.0 lb

## 2020-06-23 DIAGNOSIS — Z794 Long term (current) use of insulin: Secondary | ICD-10-CM | POA: Diagnosis not present

## 2020-06-23 DIAGNOSIS — Z79899 Other long term (current) drug therapy: Secondary | ICD-10-CM | POA: Insufficient documentation

## 2020-06-23 DIAGNOSIS — I110 Hypertensive heart disease with heart failure: Secondary | ICD-10-CM | POA: Insufficient documentation

## 2020-06-23 DIAGNOSIS — Z7982 Long term (current) use of aspirin: Secondary | ICD-10-CM | POA: Insufficient documentation

## 2020-06-23 DIAGNOSIS — I5032 Chronic diastolic (congestive) heart failure: Secondary | ICD-10-CM | POA: Insufficient documentation

## 2020-06-23 DIAGNOSIS — Z89611 Acquired absence of right leg above knee: Secondary | ICD-10-CM | POA: Diagnosis not present

## 2020-06-23 DIAGNOSIS — F17200 Nicotine dependence, unspecified, uncomplicated: Secondary | ICD-10-CM | POA: Insufficient documentation

## 2020-06-23 DIAGNOSIS — I10 Essential (primary) hypertension: Secondary | ICD-10-CM

## 2020-06-23 DIAGNOSIS — E1151 Type 2 diabetes mellitus with diabetic peripheral angiopathy without gangrene: Secondary | ICD-10-CM | POA: Insufficient documentation

## 2020-06-23 NOTE — Patient Instructions (Addendum)
Begin weighing daily and call for an overnight weight gain of > 2 pounds or a weekly weight gain of >5 pounds. 

## 2020-08-23 ENCOUNTER — Ambulatory Visit: Payer: No Typology Code available for payment source | Admitting: Family

## 2020-09-08 ENCOUNTER — Ambulatory Visit: Payer: No Typology Code available for payment source | Admitting: Family

## 2021-01-06 ENCOUNTER — Other Ambulatory Visit: Payer: Self-pay

## 2021-01-06 ENCOUNTER — Emergency Department
Admission: EM | Admit: 2021-01-06 | Discharge: 2021-01-07 | Disposition: A | Discharge status: Home / Self Care | Payer: No Typology Code available for payment source | Attending: Emergency Medicine | Admitting: Emergency Medicine

## 2021-01-06 ENCOUNTER — Encounter: Payer: Self-pay | Admitting: Intensive Care

## 2021-01-06 DIAGNOSIS — F17210 Nicotine dependence, cigarettes, uncomplicated: Secondary | ICD-10-CM | POA: Insufficient documentation

## 2021-01-06 DIAGNOSIS — Z7902 Long term (current) use of antithrombotics/antiplatelets: Secondary | ICD-10-CM | POA: Diagnosis not present

## 2021-01-06 DIAGNOSIS — L89893 Pressure ulcer of other site, stage 3: Secondary | ICD-10-CM | POA: Diagnosis not present

## 2021-01-06 DIAGNOSIS — E11649 Type 2 diabetes mellitus with hypoglycemia without coma: Secondary | ICD-10-CM | POA: Diagnosis not present

## 2021-01-06 DIAGNOSIS — I110 Hypertensive heart disease with heart failure: Secondary | ICD-10-CM | POA: Diagnosis not present

## 2021-01-06 DIAGNOSIS — Z794 Long term (current) use of insulin: Secondary | ICD-10-CM | POA: Insufficient documentation

## 2021-01-06 DIAGNOSIS — I509 Heart failure, unspecified: Secondary | ICD-10-CM | POA: Insufficient documentation

## 2021-01-06 DIAGNOSIS — Z7982 Long term (current) use of aspirin: Secondary | ICD-10-CM | POA: Insufficient documentation

## 2021-01-06 DIAGNOSIS — Z79899 Other long term (current) drug therapy: Secondary | ICD-10-CM | POA: Insufficient documentation

## 2021-01-06 DIAGNOSIS — L89154 Pressure ulcer of sacral region, stage 4: Secondary | ICD-10-CM | POA: Diagnosis not present

## 2021-01-06 DIAGNOSIS — L89213 Pressure ulcer of right hip, stage 3: Secondary | ICD-10-CM

## 2021-01-06 LAB — COMPREHENSIVE METABOLIC PANEL
ALT: 13 U/L (ref 0–44)
AST: 27 U/L (ref 15–41)
Albumin: 1.9 g/dL — ABNORMAL LOW (ref 3.5–5.0)
Alkaline Phosphatase: 87 U/L (ref 38–126)
Anion gap: 12 (ref 5–15)
BUN: 22 mg/dL (ref 8–23)
CO2: 24 mmol/L (ref 22–32)
Calcium: 8.7 mg/dL — ABNORMAL LOW (ref 8.9–10.3)
Chloride: 99 mmol/L (ref 98–111)
Creatinine, Ser: 1.18 mg/dL (ref 0.61–1.24)
GFR, Estimated: >60 mL/min (ref >60)
Glucose, Bld: 83 mg/dL (ref 70–99)
Potassium: 3.9 mmol/L (ref 3.5–5.1)
Sodium: 135 mmol/L (ref 135–145)
Total Bilirubin: 0.4 mg/dL (ref 0.3–1.2)
Total Protein: 7.4 g/dL (ref 6.5–8.1)

## 2021-01-06 LAB — CBG MONITORING, ED
Glucose-Capillary: 74 mg/dL (ref 70–99)
Glucose-Capillary: 154 mg/dL — ABNORMAL HIGH (ref 70–99)
Glucose-Capillary: 81 mg/dL (ref 70–99)
Glucose-Capillary: 80 mg/dL (ref 70–99)

## 2021-01-06 LAB — CBC WITH DIFFERENTIAL/PLATELET
Abs Immature Granulocytes: 0.15 10*3/uL — ABNORMAL HIGH (ref 0.00–0.07)
Basophils Absolute: 0 10*3/uL (ref 0.0–0.1)
Basophils Relative: 0 %
Eosinophils Absolute: 0.1 10*3/uL (ref 0.0–0.5)
Eosinophils Relative: 1 %
HCT: 26 % — ABNORMAL LOW (ref 39.0–52.0)
Hemoglobin: 7.8 g/dL — ABNORMAL LOW (ref 13.0–17.0)
Immature Granulocytes: 1 %
Lymphocytes Relative: 19 %
Lymphs Abs: 2.7 10*3/uL (ref 0.7–4.0)
MCH: 25.4 pg — ABNORMAL LOW (ref 26.0–34.0)
MCHC: 30 g/dL (ref 30.0–36.0)
MCV: 84.7 fL (ref 80.0–100.0)
Monocytes Absolute: 1.2 10*3/uL — ABNORMAL HIGH (ref 0.1–1.0)
Monocytes Relative: 8 %
Neutro Abs: 10.2 10*3/uL — ABNORMAL HIGH (ref 1.7–7.7)
Neutrophils Relative %: 71 %
Platelets: 666 10*3/uL — ABNORMAL HIGH (ref 150–400)
RBC: 3.07 MIL/uL — ABNORMAL LOW (ref 4.22–5.81)
RDW: 18.6 % — ABNORMAL HIGH (ref 11.5–15.5)
WBC: 14.3 10*3/uL — ABNORMAL HIGH (ref 4.0–10.5)
nRBC: 0 % (ref 0.0–0.2)

## 2021-01-06 LAB — LACTIC ACID, PLASMA: Lactic Acid, Venous: 1.8 mmol/L (ref 0.5–1.9)

## 2021-01-06 MED ORDER — ACETAMINOPHEN 325 MG PO TABS
650.0000 mg | ORAL_TABLET | Freq: Once | ORAL | Status: AC
  Administered 2021-01-06: 650 mg via ORAL
  Filled 2021-01-06: qty 2

## 2021-01-06 NOTE — ED Provider Notes (Signed)
[facility] Emergency Department Provider Note  ____________________________________________  Time seen: Approximately 9:37 PM  I have reviewed the triage vital signs and the nursing notes.   HISTORY  Chief Complaint Hypoglycemia    HPI Blake Villarreal is a 75 y.o. male with a history of CHF, IDDM, hypertension, PAD who was sent to the ED due to hypoglycemia.  EMS reported initial blood sugar in the 30s.  He was given supplemental dextrose by EMS, blood sugar 74 on arrival to the ED.  He has been in the waiting room for 10 hours, vital signs and blood sugars have remained stable throughout that time.  Patient does complain of back pain in the buttocks where he has a chronic wound.  He sees the [name], wound care clinic, and has follow-up with them later this week.  Denies fevers chills chest pain shortness of breath or other new symptoms.  He receives wound care at his skilled nursing facility.      Past Medical History:  Diagnosis Date  . CHF (congestive heart failure) (HCC)   . Diabetes mellitus without complication (HCC)   . Hypertension   . Intermittent confusion   . PAD (peripheral artery disease) [facility])      Patient Active Problem List   Diagnosis Date Noted  . Acute CHF (HCC) 05/20/2020  . Healthcare-associated pneumonia   . DNR (do not resuscitate) discussion   . Palliative care encounter   . Pressure injury of skin 08/16/2017  . Acute encephalopathy   . Anemia 08/15/2017  . Acute respiratory failure [facility])      Past Surgical History:  Procedure Laterality Date  . ABOVE KNEE LEG AMPUTATION    . LEG AMPUTATION ABOVE KNEE Right    PREVIOUS SURGERY     Prior to Admission medications   Medication Sig Start Date End Date Taking? Authorizing Provider  acetaminophen (TYLENOL) 325 MG tablet Take 650 mg by mouth every 6 (six) hours as needed.    [provider]  albuterol (PROVENTIL) (2.5 MG/3ML) 0.083% nebulizer solution Take 2  ampules by nebulization every 6 (six) hours as needed for wheezing.    [provider]  amLODipine (NORVASC) 10 MG tablet Take 10 mg by mouth daily.    [provider]  aspirin 81 MG chewable tablet Chew 81 mg by mouth daily.    [provider]  atorvastatin (LIPITOR) 20 MG tablet Take 20 mg by mouth daily.    [provider]  clopidogrel (PLAVIX) 75 MG tablet Take 75 mg by mouth daily.    [provider]  docusate sodium (COLACE) 100 MG capsule Take 100 mg by mouth daily as needed.     [provider]  doxycycline (VIBRAMYCIN) 100 MG capsule Take 100 mg by mouth 2 (two) times daily.    [provider]  Dulaglutide (TRULICITY) 1.5 MG/0.5ML SOPN Inject 1.5 mg into the skin once a week.    [provider]  furosemide (LASIX) 40 MG tablet Take 1 tablet (40 mg total) by mouth every other day. 05/25/20 06/24/20  Albertine Grates, MD  gabapentin (NEURONTIN) 100 MG capsule Take 100 mg by mouth 3 (three) times daily.    [provider]  hydrALAZINE (APRESOLINE) 25 MG tablet Take 1 tablet (25 mg total) by mouth 3 (three) times daily. 08/22/17   Delfino Lovett, MD  hydrocerin (EUCERIN) CREA Apply 1 application topically 2 (two) times daily.    [provider]  insulin glargine (LANTUS) 100 UNIT/ML  injection Inject 45 Units into the skin at bedtime.     [provider]  insulin regular (NOVOLIN R,HUMULIN R) 100 units/mL injection Inject 4-14 Units into the skin 3 (three) times daily before meals. 0-149 = 0 units, 150-200 = 4 units, 201-250 = 7 units, 251-300 = 9 units, 301-350 = 12 units, 351-400 = 14 units, call MD > 400.    [provider]  magnesium oxide (MAG-OX) 400 MG tablet Take 400 mg by mouth daily.     [provider]  mirtazapine (REMERON) 7.5 MG tablet Take 7.5 mg by mouth at bedtime.    [provider]  nicotine (NICODERM CQ - DOSED IN MG/24 HOURS) 14 mg/24hr patch Place 14 mg onto the  skin daily.    [provider]  pantoprazole (PROTONIX) 40 MG tablet Take 1 tablet (40 mg total) by mouth 2 (two) times daily before a meal. 08/22/17   Delfino LovettShah, Vipul, MD  terazosin (HYTRIN) 5 MG capsule Take 5 mg by mouth at bedtime.    [provider]  traMADol (ULTRAM) 50 MG tablet Take 50 mg by mouth every 6 (six) hours as needed.    [provider]  vitamin B-12 (CYANOCOBALAMIN) 1000 MCG tablet Take 1,000 mcg by mouth daily.    [provider]     Allergies Daptomycin and Iodinated diagnostic agents   Family History  Problem Relation Age of Onset  . CAD Neg Hx     Social History Social History   Tobacco Use  . Smoking status: Current Every Day Smoker    Types: Cigarettes  . Smokeless tobacco: Never Used  Substance Use Topics  . Alcohol use: No    Comment: Drank heavily in the past per his cousin  . Drug use: No    Review of Systems  Constitutional:   No fever or chills.  ENT:   No sore throat. No rhinorrhea. Cardiovascular:   No chest pain or syncope. Respiratory:   No dyspnea or cough. Gastrointestinal:   Negative for abdominal pain, vomiting and diarrhea.  Musculoskeletal:   Positive buttocks pain All other systems reviewed and are negative except as documented above in ROS and HPI.  ____________________________________________   PHYSICAL EXAM:  VITAL SIGNS: ED Triage Vitals  Enc Vitals Group     BP 01/06/21 1228 (!) 138/57     Pulse Rate 01/06/21 1228 71     Resp 01/06/21 1228 18     Temp 01/06/21 1228 98.7 F (37.1 C)     Temp Source 01/06/21 1228 Oral     SpO2 01/06/21 1228 100 %     Weight 01/06/21 1220 150 lb (68 kg)     Height 01/06/21 1220 5\' 9"  (1.753 m)     Head Circumference --      Peak Flow --      Pain Score 01/06/21 1220 8     Pain Loc --      Pain Edu? --      Excl. in GC? --     Vital signs reviewed, nursing assessments reviewed.   Constitutional:   Alert and oriented. Non-toxic  appearance. Eyes:   Conjunctivae are normal. EOMI. PERRL. ENT      Head:   Normocephalic and atraumatic.      Nose:   Normal      Mouth/Throat:   Moist mucosa      Neck:   No meningismus. Full ROM. Hematological/Lymphatic/Immunilogical:   No cervical lymphadenopathy. Cardiovascular:   RRR.  Symmetric bilateral radial and DP pulses.  No murmurs. Cap refill less than 2 seconds. Respiratory:   Normal respiratory effort without tachypnea/retractions. Breath sounds are clear and equal bilaterally. No wheezes/rales/rhonchi. Gastrointestinal:   Soft and nontender. Non distended. There is no CVA tenderness.  No rebound, rigidity, or guarding.  Musculoskeletal:   Status post right AKA.  Normal range of motion in all extremities.  There is a 4 cm soft tissue pressure wound on the right posterior proximal thigh, stage III, tunneling.  Found to be packed with gauze.  There is a large stage IV sacral decubitus ulcer extending bilaterally with tunneling and fibrinous and necrotic tissue within the wound.  No eschar.  No bleeding, no surrounding cellulitis or evidence of abscess. Neurologic:   Normal speech and language.  Motor grossly intact. No acute focal neurologic deficits are appreciated.  Skin:    Skin is warm, dry with chronic wounds as above.  No rash noted.  No petechiae, purpura, or bullae.  ____________________________________________    LABS (pertinent positives/negatives) (all labs ordered are listed, but only abnormal results are displayed) Labs Reviewed  CBC WITH DIFFERENTIAL/PLATELET - Abnormal; Notable for the following components:      Result Value   WBC 14.3 (*)    RBC 3.07 (*)    Hemoglobin 7.8 (*)    HCT 26.0 (*)    MCH 25.4 (*)    RDW 18.6 (*)    Platelets 666 (*)    Neutro Abs 10.2 (*)    Monocytes Absolute 1.2 (*)    Abs Immature Granulocytes 0.15 (*)    All other components within normal limits  COMPREHENSIVE METABOLIC PANEL - Abnormal; Notable for the following  components:   Calcium 8.7 (*)    Albumin 1.9 (*)    All other components within normal limits  CBG MONITORING, ED - Abnormal; Notable for the following components:   Glucose-Capillary 154 (*)    All other components within normal limits  LACTIC ACID, PLASMA  LACTIC ACID, PLASMA  CBG MONITORING, ED  CBG MONITORING, ED  CBG MONITORING, ED   ____________________________________________   EKG    ____________________________________________    RADIOLOGY  No results found.  ____________________________________________   PROCEDURES Procedures  ____________________________________________    CLINICAL IMPRESSION / ASSESSMENT AND PLAN / ED COURSE  Medications ordered in the ED: Medications  acetaminophen (TYLENOL) tablet 650 mg (650 mg Oral Given 01/06/21 2022)    Pertinent labs & imaging results that were available during my care of the patient were reviewed by me and considered in my medical decision making (see chart for details).  Blake Villarreal was evaluated in Emergency Department on 01/06/2021 for the symptoms described in the history of present illness. He was evaluated in the context of the global COVID-19 pandemic, which necessitated consideration that the patient might be at risk for infection with the SARS-CoV-2 virus that causes COVID-19. Institutional protocols and algorithms that pertain to the evaluation of patients at risk for COVID-19 are in a state of rapid change based on information released by regulatory bodies including the CDC and federal and state organizations. These policies and algorithms were followed during the patient's care in the ED.   Patient sent to ED due to hypoglycemia.  Also has chronic wounds which were evaluated.  Vital signs are normal, labs are at baseline with mild chronic leukocytosis.  Not septic, on exam no signs of acute infection.  Discussed with surgery Dr. Tonna Boehringer who feels this can continue to  be managed outpatient with surgery  clinic follow-up.  Will redress the wounds and discharge back to his SNF.  Patient is feeling well, wants to eat, wants to go home.      ____________________________________________   FINAL CLINICAL IMPRESSION(S) / ED DIAGNOSES    Final diagnoses:  Sacral decubitus ulcer, stage IV (HCC)  Decubitus ulcer of right thigh, stage 3 (HCC)  Type 2 diabetes mellitus with hypoglycemia without coma, with long-term current use of insulin [facility])     ED Discharge Orders    None      Portions of this note were generated with dragon dictation software. Dictation errors may occur despite best attempts at proofreading.   Sharman Cheek, MD 01/06/21 2142

## 2021-01-06 NOTE — ED Notes (Signed)
Patient given sandwich tray and and ginger ale at this time.

## 2021-01-06 NOTE — ED Notes (Signed)
Pt soiled in recliner, writer and Alissa, EDT changed pt and applied new dry brief. Pt has multiple foul draining wounds to buttocks and scrotum.   CBG checked at this time

## 2021-01-06 NOTE — ED Notes (Signed)
Pt came to hall5 in a recliner.  Chucks under him.  Moved pt to stetcher

## 2021-01-06 NOTE — ED Triage Notes (Addendum)
Patient brought in by EMS from [facility] for hypoglycemia. Blood sugar check with EMS in 30s. Patient given bag of D5 and ampule of D50. Blood sugar in triage 74. Right leg below knee amputation. Patient has draining wound present on buttocks with foul smell. C/o pain on buttocks

## 2021-01-06 NOTE — ED Notes (Signed)
Patient given graham crackers and grape juice. Refused orange juice.

## 2021-01-07 LAB — CBG MONITORING, ED: Glucose-Capillary: 143 mg/dL — ABNORMAL HIGH (ref 70–99)

## 2021-01-07 NOTE — ED Notes (Signed)
Pt given Happy meal and additional crackers as well as ginger ale.  Pt is eating well

## 2021-01-07 NOTE — ED Notes (Signed)
EMS here for transport, no answer at [facility] care to notify of pt's discharge.

## 2021-01-07 NOTE — ED Notes (Signed)
Dressing placed over open sacral wound and open hip/perineal wound.

## 2021-01-22 ENCOUNTER — Emergency Department: Payer: No Typology Code available for payment source

## 2021-01-22 ENCOUNTER — Encounter: Payer: Self-pay | Admitting: Emergency Medicine

## 2021-01-22 ENCOUNTER — Inpatient Hospital Stay: Payer: No Typology Code available for payment source

## 2021-01-22 ENCOUNTER — Inpatient Hospital Stay
Admission: EM | Admit: 2021-01-22 | Discharge: 2021-01-28 | DRG: 177 | Disposition: A | Discharge status: Skilled Nursing Facility | Payer: No Typology Code available for payment source | Source: Skilled Nursing Facility | Attending: Internal Medicine | Admitting: Internal Medicine

## 2021-01-22 ENCOUNTER — Other Ambulatory Visit: Payer: Self-pay

## 2021-01-22 DIAGNOSIS — U071 COVID-19: Principal | ICD-10-CM | POA: Diagnosis present

## 2021-01-22 DIAGNOSIS — D509 Iron deficiency anemia, unspecified: Secondary | ICD-10-CM | POA: Diagnosis present

## 2021-01-22 DIAGNOSIS — Z7401 Bed confinement status: Secondary | ICD-10-CM | POA: Diagnosis not present

## 2021-01-22 DIAGNOSIS — Z87891 Personal history of nicotine dependence: Secondary | ICD-10-CM | POA: Diagnosis not present

## 2021-01-22 DIAGNOSIS — J441 Chronic obstructive pulmonary disease with (acute) exacerbation: Secondary | ICD-10-CM | POA: Diagnosis present

## 2021-01-22 DIAGNOSIS — Z681 Body mass index (BMI) 19 or less, adult: Secondary | ICD-10-CM | POA: Diagnosis not present

## 2021-01-22 DIAGNOSIS — E1122 Type 2 diabetes mellitus with diabetic chronic kidney disease: Secondary | ICD-10-CM | POA: Diagnosis present

## 2021-01-22 DIAGNOSIS — J9601 Acute respiratory failure with hypoxia: Secondary | ICD-10-CM | POA: Diagnosis present

## 2021-01-22 DIAGNOSIS — E1151 Type 2 diabetes mellitus with diabetic peripheral angiopathy without gangrene: Secondary | ICD-10-CM | POA: Diagnosis present

## 2021-01-22 DIAGNOSIS — N179 Acute kidney failure, unspecified: Secondary | ICD-10-CM | POA: Diagnosis present

## 2021-01-22 DIAGNOSIS — I2510 Atherosclerotic heart disease of native coronary artery without angina pectoris: Secondary | ICD-10-CM | POA: Diagnosis present

## 2021-01-22 DIAGNOSIS — J189 Pneumonia, unspecified organism: Secondary | ICD-10-CM

## 2021-01-22 DIAGNOSIS — R6521 Severe sepsis with septic shock: Secondary | ICD-10-CM | POA: Diagnosis not present

## 2021-01-22 DIAGNOSIS — E1169 Type 2 diabetes mellitus with other specified complication: Secondary | ICD-10-CM | POA: Diagnosis present

## 2021-01-22 DIAGNOSIS — A419 Sepsis, unspecified organism: Secondary | ICD-10-CM | POA: Diagnosis not present

## 2021-01-22 DIAGNOSIS — Z993 Dependence on wheelchair: Secondary | ICD-10-CM | POA: Diagnosis not present

## 2021-01-22 DIAGNOSIS — L89154 Pressure ulcer of sacral region, stage 4: Secondary | ICD-10-CM | POA: Diagnosis present

## 2021-01-22 DIAGNOSIS — Z8249 Family history of ischemic heart disease and other diseases of the circulatory system: Secondary | ICD-10-CM

## 2021-01-22 DIAGNOSIS — J690 Pneumonitis due to inhalation of food and vomit: Secondary | ICD-10-CM | POA: Diagnosis present

## 2021-01-22 DIAGNOSIS — E875 Hyperkalemia: Secondary | ICD-10-CM | POA: Diagnosis present

## 2021-01-22 DIAGNOSIS — Z833 Family history of diabetes mellitus: Secondary | ICD-10-CM | POA: Diagnosis not present

## 2021-01-22 DIAGNOSIS — J9621 Acute and chronic respiratory failure with hypoxia: Secondary | ICD-10-CM | POA: Diagnosis present

## 2021-01-22 DIAGNOSIS — Y95 Nosocomial condition: Secondary | ICD-10-CM | POA: Diagnosis present

## 2021-01-22 DIAGNOSIS — J1282 Pneumonia due to coronavirus disease 2019: Secondary | ICD-10-CM | POA: Diagnosis present

## 2021-01-22 DIAGNOSIS — Z89611 Acquired absence of right leg above knee: Secondary | ICD-10-CM | POA: Diagnosis not present

## 2021-01-22 DIAGNOSIS — Z7189 Other specified counseling: Secondary | ICD-10-CM | POA: Diagnosis not present

## 2021-01-22 DIAGNOSIS — R4182 Altered mental status, unspecified: Secondary | ICD-10-CM | POA: Diagnosis present

## 2021-01-22 DIAGNOSIS — J440 Chronic obstructive pulmonary disease with acute lower respiratory infection: Secondary | ICD-10-CM | POA: Diagnosis present

## 2021-01-22 DIAGNOSIS — I129 Hypertensive chronic kidney disease with stage 1 through stage 4 chronic kidney disease, or unspecified chronic kidney disease: Secondary | ICD-10-CM | POA: Diagnosis present

## 2021-01-22 DIAGNOSIS — Z515 Encounter for palliative care: Secondary | ICD-10-CM | POA: Diagnosis not present

## 2021-01-22 DIAGNOSIS — Z89619 Acquired absence of unspecified leg above knee: Secondary | ICD-10-CM

## 2021-01-22 DIAGNOSIS — E43 Unspecified severe protein-calorie malnutrition: Secondary | ICD-10-CM | POA: Diagnosis present

## 2021-01-22 DIAGNOSIS — F4312 Post-traumatic stress disorder, chronic: Secondary | ICD-10-CM | POA: Diagnosis present

## 2021-01-22 DIAGNOSIS — R0902 Hypoxemia: Secondary | ICD-10-CM | POA: Diagnosis present

## 2021-01-22 DIAGNOSIS — N189 Chronic kidney disease, unspecified: Secondary | ICD-10-CM | POA: Diagnosis present

## 2021-01-22 DIAGNOSIS — Z933 Colostomy status: Secondary | ICD-10-CM

## 2021-01-22 HISTORY — DX: Acquired absence of left leg above knee: Z89.612

## 2021-01-22 HISTORY — DX: Pneumonia, unspecified organism: J18.9

## 2021-01-22 HISTORY — DX: Post-traumatic stress disorder, unspecified: F43.10

## 2021-01-22 HISTORY — DX: Chronic obstructive pulmonary disease, unspecified: J44.9

## 2021-01-22 HISTORY — DX: Pressure ulcer of sacral region, unspecified stage: L89.159

## 2021-01-22 HISTORY — DX: Atherosclerotic heart disease of native coronary artery without angina pectoris: I25.10

## 2021-01-22 HISTORY — DX: Anemia, unspecified: D64.9

## 2021-01-22 HISTORY — DX: Type 2 diabetes mellitus without complications: E11.9

## 2021-01-22 HISTORY — DX: Essential (primary) hypertension: I10

## 2021-01-22 HISTORY — DX: Chronic kidney disease, unspecified: N18.9

## 2021-01-22 HISTORY — DX: Dyspnea, unspecified: R06.00

## 2021-01-22 LAB — FERRITIN: Ferritin: 440 ng/mL — ABNORMAL HIGH (ref 24–336)

## 2021-01-22 LAB — SARS CORONAVIRUS 2 BY RT PCR (HOSPITAL ORDER, PERFORMED IN ~~LOC~~ HOSPITAL LAB): SARS Coronavirus 2: POSITIVE — AB

## 2021-01-22 LAB — CBC WITH DIFFERENTIAL/PLATELET
Abs Immature Granulocytes: 0.23 10*3/uL — ABNORMAL HIGH (ref 0.00–0.07)
Basophils Absolute: 0 10*3/uL (ref 0.0–0.1)
Eosinophils Absolute: 0 10*3/uL (ref 0.0–0.5)
Eosinophils Relative: 0 %
HCT: 24.1 % — ABNORMAL LOW (ref 39.0–52.0)
Hemoglobin: 7.1 g/dL — ABNORMAL LOW (ref 13.0–17.0)
Lymphs Abs: 3.4 10*3/uL (ref 0.7–4.0)
MCH: 25 pg — ABNORMAL LOW (ref 26.0–34.0)
MCV: 84.9 fL (ref 80.0–100.0)
Neutro Abs: 12.8 10*3/uL — ABNORMAL HIGH (ref 1.7–7.7)
Neutrophils Relative %: 74 %
Platelets: 641 10*3/uL — ABNORMAL HIGH (ref 150–400)
RBC: 2.84 MIL/uL — ABNORMAL LOW (ref 4.22–5.81)
RDW: 19 % — ABNORMAL HIGH (ref 11.5–15.5)
Smear Review: NORMAL
nRBC: 0 % (ref 0.0–0.2)

## 2021-01-22 LAB — COMPREHENSIVE METABOLIC PANEL
ALT: 21 U/L (ref 0–44)
Albumin: 1.7 g/dL — ABNORMAL LOW (ref 3.5–5.0)
Alkaline Phosphatase: 91 U/L (ref 38–126)
Anion gap: 13 (ref 5–15)
CO2: 21 mmol/L — ABNORMAL LOW (ref 22–32)
Calcium: 8.6 mg/dL — ABNORMAL LOW (ref 8.9–10.3)
Chloride: 102 mmol/L (ref 98–111)
Creatinine, Ser: 4.16 mg/dL — ABNORMAL HIGH (ref 0.61–1.24)
Potassium: 5.4 mmol/L — ABNORMAL HIGH (ref 3.5–5.1)
Sodium: 136 mmol/L (ref 135–145)
Total Bilirubin: 0.5 mg/dL (ref 0.3–1.2)

## 2021-01-22 LAB — CBG MONITORING, ED: Glucose-Capillary: 280 mg/dL — ABNORMAL HIGH (ref 70–99)

## 2021-01-22 LAB — CBC
HCT: 23.3 % — ABNORMAL LOW (ref 39.0–52.0)
Hemoglobin: 7.2 g/dL — ABNORMAL LOW (ref 13.0–17.0)
MCH: 26 pg (ref 26.0–34.0)
MCHC: 30.9 g/dL (ref 30.0–36.0)
MCV: 84.1 fL (ref 80.0–100.0)
Platelets: 595 10*3/uL — ABNORMAL HIGH (ref 150–400)
RBC: 2.77 MIL/uL — ABNORMAL LOW (ref 4.22–5.81)
RDW: 19.1 % — ABNORMAL HIGH (ref 11.5–15.5)
WBC: 17.9 10*3/uL — ABNORMAL HIGH (ref 4.0–10.5)
nRBC: 0 % (ref 0.0–0.2)

## 2021-01-22 LAB — RETICULOCYTES
Immature Retic Fract: 18.6 % — ABNORMAL HIGH (ref 2.3–15.9)
RBC.: 2.81 MIL/uL — ABNORMAL LOW (ref 4.22–5.81)
Retic Count, Absolute: 32.6 10*3/uL (ref 19.0–186.0)
Retic Ct Pct: 1.2 % (ref 0.4–3.1)

## 2021-01-22 LAB — LACTIC ACID, PLASMA
Lactic Acid, Venous: 1.5 mmol/L (ref 0.5–1.9)
Lactic Acid, Venous: 1.2 mmol/L (ref 0.5–1.9)

## 2021-01-22 LAB — IRON AND TIBC
Iron: 10 ug/dL — ABNORMAL LOW (ref 45–182)
Saturation Ratios: 10 % — ABNORMAL LOW (ref 17.9–39.5)
TIBC: 102 ug/dL — ABNORMAL LOW (ref 250–450)
UIBC: 92 ug/dL

## 2021-01-22 LAB — APTT: aPTT: 41 seconds — ABNORMAL HIGH (ref 24–36)

## 2021-01-22 LAB — CREATININE, SERUM
Creatinine, Ser: 3.57 mg/dL — ABNORMAL HIGH (ref 0.61–1.24)
GFR, Estimated: 17 mL/min — ABNORMAL LOW (ref >60)

## 2021-01-22 LAB — PROCALCITONIN: Procalcitonin: 1.91 ng/mL

## 2021-01-22 LAB — FOLATE: Folate: 41 ng/mL (ref >5.9)

## 2021-01-22 LAB — AMMONIA: Ammonia: 20 umol/L (ref 9–35)

## 2021-01-22 LAB — PROTIME-INR: INR: 1.2 (ref 0.8–1.2)

## 2021-01-22 MED ORDER — DEXAMETHASONE SODIUM PHOSPHATE 10 MG/ML IJ SOLN
10.0000 mg | Freq: Once | INTRAMUSCULAR | Status: AC
  Administered 2021-01-22: 10 mg via INTRAVENOUS
  Filled 2021-01-22: qty 1

## 2021-01-22 MED ORDER — SODIUM CHLORIDE 0.9 % IV SOLN
2.0000 g | Freq: Once | INTRAVENOUS | Status: AC
  Administered 2021-01-22: 2 g via INTRAVENOUS
  Filled 2021-01-22: qty 2

## 2021-01-22 MED ORDER — SODIUM CHLORIDE 0.9 % IV SOLN
3.0000 g | Freq: Two times a day (BID) | INTRAVENOUS | Status: DC
  Administered 2021-01-23: 3 g via INTRAVENOUS
  Filled 2021-01-22 (×3): qty 8

## 2021-01-22 MED ORDER — ACETAMINOPHEN 325 MG PO TABS
650.0000 mg | ORAL_TABLET | Freq: Four times a day (QID) | ORAL | Status: DC | PRN
  Administered 2021-01-25: 650 mg via ORAL
  Filled 2021-01-22: qty 2

## 2021-01-22 MED ORDER — ONDANSETRON HCL 4 MG/2ML IJ SOLN: 4.0000 mg | Freq: Four times a day (QID) | INTRAMUSCULAR | Status: DC | PRN

## 2021-01-22 MED ORDER — CARVEDILOL 6.25 MG PO TABS
6.2500 mg | ORAL_TABLET | Freq: Two times a day (BID) | ORAL | Status: DC
  Administered 2021-01-23 – 2021-01-28 (×11): 6.25 mg via ORAL
  Filled 2021-01-22 (×11): qty 1

## 2021-01-22 MED ORDER — ONDANSETRON HCL 4 MG PO TABS: 4.0000 mg | ORAL_TABLET | Freq: Four times a day (QID) | ORAL | Status: DC | PRN

## 2021-01-22 MED ORDER — ACETAMINOPHEN 650 MG RE SUPP: 650.0000 mg | Freq: Four times a day (QID) | RECTAL | Status: DC | PRN

## 2021-01-22 MED ORDER — SODIUM CHLORIDE 0.9 % IV SOLN
100.0000 mg | Freq: Every day | INTRAVENOUS | Status: DC
  Administered 2021-01-23 – 2021-01-24 (×2): 100 mg via INTRAVENOUS
  Filled 2021-01-22 (×2): qty 100

## 2021-01-22 MED ORDER — SODIUM CHLORIDE 0.9 % IV BOLUS (SEPSIS)
1000.0000 mL | Freq: Once | INTRAVENOUS | Status: AC
  Administered 2021-01-22: 1000 mL via INTRAVENOUS

## 2021-01-22 MED ORDER — SODIUM CHLORIDE 0.9 % IV BOLUS (SEPSIS): 250.0000 mL | Freq: Once | INTRAVENOUS | Status: DC

## 2021-01-22 MED ORDER — SODIUM CHLORIDE 0.9 % IV SOLN
200.0000 mg | Freq: Once | INTRAVENOUS | Status: AC
  Administered 2021-01-22: 200 mg via INTRAVENOUS
  Filled 2021-01-22: qty 200

## 2021-01-22 MED ORDER — VANCOMYCIN HCL IN DEXTROSE 1-5 GM/200ML-% IV SOLN
1000.0000 mg | Freq: Once | INTRAVENOUS | Status: AC
  Administered 2021-01-22: 1000 mg via INTRAVENOUS
  Filled 2021-01-22: qty 200

## 2021-01-22 MED ORDER — METHYLPREDNISOLONE SODIUM SUCC 125 MG IJ SOLR
60.0000 mg | Freq: Two times a day (BID) | INTRAMUSCULAR | Status: DC
  Administered 2021-01-22 – 2021-01-25 (×7): 60 mg via INTRAVENOUS
  Filled 2021-01-22 (×7): qty 2

## 2021-01-22 MED ORDER — HEPARIN SODIUM (PORCINE) 5000 UNIT/ML IJ SOLN
5000.0000 [IU] | Freq: Three times a day (TID) | INTRAMUSCULAR | Status: DC
  Administered 2021-01-23 – 2021-01-28 (×16): 5000 [IU] via SUBCUTANEOUS
  Filled 2021-01-22 (×16): qty 1

## 2021-01-22 MED ORDER — SODIUM CHLORIDE 0.9 % IV SOLN: INTRAVENOUS | Status: DC

## 2021-01-22 MED ORDER — SODIUM CHLORIDE 0.9 % IV BOLUS (SEPSIS): 500.0000 mL | Freq: Once | INTRAVENOUS | Status: DC

## 2021-01-22 NOTE — ED Triage Notes (Signed)
Pt from [place] [facility] for resp distress, AMS. Last known well 1500 yesterday.

## 2021-01-22 NOTE — Progress Notes (Signed)
PHARMACY -  BRIEF ANTIBIOTIC NOTE   Pharmacy has received consult(s) for Vancomycin and cefepime from an ED provider.  The patient's profile has been reviewed for ht/wt/allergies/indication/available labs.    One time order(s) placed by MD for Cefepime 2 gm and Vancomycin 1000 mg  Further antibiotics/pharmacy consults should be ordered by admitting physician if indicated.                       Thank you, Faizan Geraci A 01/22/2021  10:54 AM

## 2021-01-22 NOTE — ED Provider Notes (Signed)
[facility] Emergency Department Provider Note  ____________________________________________  Time seen: Approximately 3:00 PM  I have reviewed the triage vital signs and the nursing notes.   HISTORY  Chief Complaint Respiratory Distress    Level 5 Caveat: Portions of the History and Physical including HPI and review of systems are unable to be completely obtained due to patient altered mental status/confusion  HPI Blake Villarreal. is a 75 y.o. male with a history of diabetes hypertension CKD chronic disability with sacral decubitus ulcers who was brought to the ED due to respiratory distress, confusion.  EMS report initial oxygen saturation of about 40% requiring nonrebreather oxygen.  Patient endorses shortness of breath, no chest pain.  Also complains of left calf pain.      Past Medical History:  Diagnosis Date  . CKD (chronic kidney disease)   . Diabetes mellitus without complication (HCC)   . Hx of AKA (above knee amputation), left (HCC)   . Hypertension   . Pressure ulcer, sacrum   . PTSD (post-traumatic stress disorder)      There are no problems to display for this patient.       Prior to Admission medications   Not on File     Allergies Patient has no allergy information on record.   No family history on file.  Social History Social History   Tobacco Use  . Smoking status: Heavy Tobacco Smoker    Review of Systems Level 5 Caveat: Portions of the History and Physical including HPI and review of systems are unable to be completely obtained due to patient being a poor historian   Constitutional:   No known fever.  ENT:   No rhinorrhea. Cardiovascular:   No chest pain or syncope. Respiratory:   Positive shortness of breath and cough. Gastrointestinal:   Negative for abdominal pain, vomiting and diarrhea.  Musculoskeletal:   Negative for focal pain or swelling ____________________________________________   PHYSICAL  EXAM:  VITAL SIGNS: ED Triage Vitals  Enc Vitals Group     BP 01/22/21 1100 (!) 77/66     Pulse Rate 01/22/21 1100 91     Resp 01/22/21 1240 15     Temp 01/22/21 1100 (!) 97.1 F (36.2 C)     Temp Source 01/22/21 1100 Axillary     SpO2 01/22/21 1100 100 %     Weight 01/22/21 1106 145 lb (65.8 kg)     Height --      Head Circumference --      Peak Flow --      Pain Score --      Pain Loc --      Pain Edu? --      Excl. in GC? --     Vital signs reviewed, nursing assessments reviewed.   Constitutional: Awake, not oriented.  Ill-appearing Eyes:   Conjunctivae are normal. EOMI. PERRL. ENT      Head:   Normocephalic and atraumatic.      Nose:   No congestion/rhinnorhea.       Mouth/Throat:   Dry mucous membranes, no pharyngeal erythema. No peritonsillar mass.       Neck:   No meningismus. Full ROM. Hematological/Lymphatic/Immunilogical:   No cervical lymphadenopathy. Cardiovascular:   RRR. Symmetric bilateral radial  Pulses. Non palpable L DP/PT pulse, but foot is warm with intact cap refill.  No murmurs. Cap refill 3-4 seconds. Respiratory: Crackles in right base, breath sounds diminished in left lower lung.. Gastrointestinal:   Soft and  nontender. Non distended. There is no CVA tenderness.  No rebound, rigidity, or guarding.  Musculoskeletal:   Status post right BKA.  Emaciated and deconditioned.  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  Wound has been packed and has clean gauze and dressing.  Neurologic:   Normal speech and language.  Motor grossly intact. No acute focal neurologic deficits are appreciated.  Skin:    Skin is warm, dry with decubitus ulcer as above.  No rash noted.  No petechiae, purpura, or bullae.  ____________________________________________    LABS (pertinent positives/negatives) (all labs ordered are listed, but only abnormal results are displayed) Labs Reviewed  SARS  CORONAVIRUS 2 BY RT PCR (HOSPITAL ORDER, PERFORMED IN [facility]) - Abnormal; Notable for the following components:      Result Value   SARS Coronavirus 2 POSITIVE (*)    All other components within normal limits  COMPREHENSIVE METABOLIC PANEL - Abnormal; Notable for the following components:   Potassium 5.4 (*)    CO2 21 (*)    Glucose, Bld 126 (*)    BUN 60 (*)    Creatinine, Ser 4.16 (*)    Calcium 8.6 (*)    Albumin 1.7 (*)    AST 44 (*)    GFR, Estimated 14 (*)    All other components within normal limits  CBC WITH DIFFERENTIAL/PLATELET - Abnormal; Notable for the following components:   WBC 17.6 (*)    RBC 2.84 (*)    Hemoglobin 7.1 (*)    HCT 24.1 (*)    MCH 25.0 (*)    MCHC 29.5 (*)    RDW 19.0 (*)    Platelets 641 (*)    Neutro Abs 12.8 (*)    Abs Immature Granulocytes 0.23 (*)    All other components within normal limits  APTT - Abnormal; Notable for the following components:   aPTT 41 (*)    All other components within normal limits  BLOOD GAS, VENOUS - Abnormal; Notable for the following components:   Acid-base deficit 4.0 (*)    All other components within normal limits  CBG MONITORING, ED - Abnormal; Notable for the following components:   Glucose-Capillary 280 (*)    All other components within normal limits  CULTURE, BLOOD (SINGLE)  URINE CULTURE  LACTIC ACID, PLASMA  PROTIME-INR  AMMONIA  PROCALCITONIN  LACTIC ACID, PLASMA  URINALYSIS, COMPLETE (UACMP) WITH MICROSCOPIC   ____________________________________________   EKG    ____________________________________________    RADIOLOGY  DG Chest Port 1 View  Result Date: 01/22/2021 CLINICAL DATA:  Respiratory distress and altered mental status. EXAM: PORTABLE CHEST 1 VIEW COMPARISON:  None. FINDINGS: Atherosclerotic calcification of the aortic arch. Tortuous thoracic aorta. Heart size within normal limits for projection. Abnormal blunting of the left lateral costophrenic angle.  Linear bandlike scarring peripherally in the left lung base. There is some hazy density at the left lung base, possibly from layering pleural fluid. Volume loss at the left lung base. The right lung appears clear. IMPRESSION: 1. Hazy density at the left lung base, potentially from layering pleural fluid. Blunted left lateral costophrenic angle. 2. Scarring peripherally in the left lung base with some mild left basilar volume loss. 3. Tortuous thoracic aorta.  Aortic Atherosclerosis (ICD10-I70.0). Electronically Signed   By: Gaylyn Rong M.D.   On: 01/22/2021 11:23    ____________________________________________   PROCEDURES .Critical Care Performed by: Sharman Cheek, MD Authorized by: Scotty Court,  Aneta Mins, MD   Critical care provider statement:    Critical care time (minutes):  40   Critical care time was exclusive of:  Separately billable procedures and treating other patients   Critical care was necessary to treat or prevent imminent or life-threatening deterioration of the following conditions:  Shock, sepsis and respiratory failure   Critical care was time spent personally by me on the following activities:  Development of treatment plan with patient or surrogate, discussions with consultants, evaluation of patient's response to treatment, examination of patient, obtaining history from patient or surrogate, ordering and performing treatments and interventions, ordering and review of laboratory studies, ordering and review of radiographic studies, pulse oximetry, re-evaluation of patient's condition and review of old charts    ____________________________________________  DIFFERENTIAL DIAGNOSIS   Pneumonia, pneumothorax, pleural effusion, dehydration, electrolyte abnormality, cellulitis, septic shock  CLINICAL IMPRESSION / ASSESSMENT AND PLAN / ED COURSE  Medications ordered in the ED: Medications  remdesivir 200 mg in sodium chloride 0.9% 250 mL IVPB (0 mg Intravenous Stopped  01/22/21 1440)    Followed by  remdesivir 100 mg in sodium chloride 0.9 % 100 mL IVPB (has no administration in time range)  sodium chloride 0.9 % bolus 1,000 mL (0 mLs Intravenous Stopped 01/22/21 1248)  vancomycin (VANCOCIN) IVPB 1000 mg/200 mL premix (0 mg Intravenous Stopped 01/22/21 1402)  ceFEPIme (MAXIPIME) 2 g in sodium chloride 0.9 % 100 mL IVPB (0 g Intravenous Stopped 01/22/21 1219)  sodium chloride 0.9 % bolus 1,000 mL (0 mLs Intravenous Stopped 01/22/21 1326)  dexamethasone (DECADRON) injection 10 mg (10 mg Intravenous Given 01/22/21 1242)    Pertinent labs & imaging results that were available during my care of the patient were reviewed by me and considered in my medical decision making (see chart for details).   Blake Villarreal. was evaluated in Emergency Department on 01/22/2021 for the symptoms described in the history of present illness. He was evaluated in the context of the global COVID-19 pandemic, which necessitated consideration that the patient might be at risk for infection with the SARS-CoV-2 virus that causes COVID-19. Institutional protocols and algorithms that pertain to the evaluation of patients at risk for COVID-19 are in a state of rapid change based on information released by regulatory bodies including the CDC and federal and state organizations. These policies and algorithms were followed during the patient's care in the ED.   Patient presents with respiratory distress, hypertension, most suspicious for septic shock related to pneumonia.  Will start large-volume fluid bolus, empiric antibiotics with cefepime and vancomycin, obtain sepsis work-up, chest x-ray, UA, Covid test  Clinical Course as of 01/22/21 1524  Mon Jan 22, 2021  1119 Persistent hypotension.  Creatinine is 4 consistent with AKI compared to baseline creatinine 1.2 on 01/06/2021.  Baseline hemoglobin is 8, so hemoglobin today of 7 is consistent with chronic anemia.  Review of records also shows previously  MRSA positive.  Continue broad-spectrum antibiotics of vancomycin and cefepime.  Chest x-ray viewed and interpreted by me, shows left lung diffuse hazy opacity consistent with pneumonia.  Breathing comfortably on BiPAP, oxygenating adequately. [PS]  1345 Sepsis reassessment completed.  Mental status is improved.  Continuing to breathe comfortably on BiPAP.  Blood pressure normalized with a MAP of 80.  No refractory shock, vasopressors not indicated. [PS]    Clinical Course User Index [PS] Sharman Cheek, MD     ----------------------------------------- 3:23 PM on 01/22/2021 -----------------------------------------  Case discussed with intensivist Dr. Jayme Cloud, who agrees  the chest x-ray demonstrates pneumonia.  She will evaluate patient, feels that he would be appropriate for stepdown admission with hospitalist.  ____________________________________________   FINAL CLINICAL IMPRESSION(S) / ED DIAGNOSES    Final diagnoses:  Septic shock (HCC)  HCAP (healthcare-associated pneumonia)  COVID-19 virus infection  Pressure injury of sacral region, stage 4 (HCC)  AKI (acute kidney injury) [facility])     ED Discharge Orders    None      Portions of this note were generated with dragon dictation software. Dictation errors may occur despite best attempts at proofreading.   Sharman Cheek, MD 01/22/21 1524

## 2021-01-22 NOTE — Consult Note (Signed)
CODE Questionable SEPSIS - PHARMACY COMMUNICATION  **Broad Spectrum Antibiotics should be administered within 1 hour of Sepsis diagnosis**  Time Code Sepsis Called/Page Received: 1041  Antibiotics Ordered: Vanco + Cefepime  Time of 1st antibiotic administration: 1149  Additional action taken by pharmacy: Reminded nurse of questionable code sepsis status with active Abx orders  If necessary, Name of Provider/Nurse Contacted: Knox Royalty, RN    Sharen Hones ,PharmD, BCPS Clinical Pharmacist  01/22/2021  12:29 PM

## 2021-01-22 NOTE — Consult Note (Signed)
Remdesivir - Pharmacy Brief Note   O:  ALT: 21 CXR: Hazy density at the left lung base, potentially from layering pleural fluid. Blunted left lateral costophrenic angle. SpO2: 100% on BiPAP   A/P:  Remdesivir 200 mg IVPB once followed by 100 mg IVPB daily x 4 days.   Sharen Hones, PharmD, BCPS Clinical Pharmacist  01/22/2021 12:21 PM

## 2021-01-22 NOTE — Consult Note (Addendum)
Pharmacy Antibiotic Note  Blake Villarreal. is a 75 y.o. male admitted on 01/22/2021 due to respiratory distress and altered mental status. PMH includes T2DM, HTN, and CKD. Pt endorses SOB, initial O2 sat was ~40% so was put on nonrebreather mask. Pt was hypotensive (77/66) and Scr of 4.16mg /dL (estimated CNOB~09 mL/min) upon arrival. Chest imaging concerning for  pneumonia. Patient found to be COVID+. Pharmacy has been consulted for Unasyn dosing for aspiration PNA.   Plan: Will change Unasyn 3g q12H to Unasyn 3g q24H. CrCl < 15. Once CrCl is above 15 ml/min adjust dosing to q12H.     Weight: 65.8 kg (145 lb)  Temp (24hrs), Avg:97.1 F (36.2 C), Min:97.1 F (36.2 C), Max:97.1 F (36.2 C)  Recent Labs  Lab 01/22/21 1035  WBC 17.6*  CREATININE 4.16*  LATICACIDVEN 1.5    CrCl cannot be calculated (Unknown ideal weight.).    Not on File  Antimicrobials this admission: 2/7 cefepime + vanc x 1 2/7 Unasyn >>  Dose adjustments this admission: Unasyn 3g q12H > Unasyn 3g q24H.   Microbiology results: 2/7 BCx: pending 2/7 UCx: pending   Thank you for allowing pharmacy to be a part of this patients care.  Paschal Dopp, PharmD, BCPS

## 2021-01-22 NOTE — H&P (Addendum)
History and Physical    Om W Lollie Sails. GDJ:[id number] DOB: 09/14/1946 DOA: 01/22/2021  Referring MD/NP/PA: EDP PCP:  Patient coming from: Skilled nursing facility [facility]  Chief Complaint: Respiratory distress  HPI: Blake Villarreal. is a 75 y.o. male chronically ill nursing home resident with history of COPD, CAD severe peripheral vascular disease, right above-knee amputation, chronic sacral decubitus ulcers, chronic kidney disease stage unknown, chronic anemia, severe malnutrition, PTSD, chronic lymphedema was brought to the ED today from [facility] due to respiratory distress patient reports being in his usual state of health yesterday,, he is bedbound and wheelchair-bound at baseline, reports feeling short short of breath since this morning, worsened over a few hours and became confused, EMS was called upon arrival his sats were noted to be around 40% on room air which quickly improved after a nonrebreather mask was placed, subsequently brought to the emergency room ED Course: Hypotensive with blood pressure in the 70s which improved to low 100s after a liter of saline bolus, was also initially placed on BiPAP for respiratory distress and quickly weaned down to 2 L nasal cannula, lab work-up was notable for leukocytosis, white count of 17.6, hemoglobin of 7.1, creatinine of 4.1, potassium of 5.4 SARS COVID-19 PCR was positive, chest x-ray concerning for possible hazy density of the left at the left base, probable layering pleural effusion, otherwise clear  Review of Systems: As per HPI otherwise 14 point review of systems negative.   Past Medical History:  Diagnosis Date  . CKD (chronic kidney disease)   . Diabetes mellitus without complication (HCC)   . Hx of AKA (above knee amputation), left (HCC)   . Hypertension   . Pressure ulcer, sacrum   . PTSD (post-traumatic stress disorder)     Social History -Long history of tobacco abuse, denies alcohol use,  current resident of skilled nursing facility  Not on File  Family history -History of heart disease and diabetes in his parents   Prior to Admission medications   Not on File    Physical Exam: Vitals:   01/22/21 1410 01/22/21 1530 01/22/21 1550 01/22/21 1554  BP: 131/64 (!) 114/53 (!) 119/53   Pulse: 85 79 83   Resp: (!) 22 (!) 21 19   Temp:      TempSrc:      SpO2: 100% 93% 95% 99%  Weight:          Constitutional: Chronically ill-appearing elderly male laying in bed, awake alert oriented x2 Vitals:   01/22/21 1410 01/22/21 1530 01/22/21 1550 01/22/21 1554  BP: 131/64 (!) 114/53 (!) 119/53   Pulse: 85 79 83   Resp: (!) 22 (!) 21 19   Temp:      TempSrc:      SpO2: 100% 93% 95% 99%  Weight:       HEENT: No JVD noted CVS: S1-S2, regular rate rhythm Lungs: Poor air movement bilaterally, decreased breath sounds to bases Abdomen: Soft, nontender, colostomy with liquid stool Extremities, right AKA, left leg with chronic skin changes, hyperpigmentation, scaling Skin: Multiple large extensive deep sacral decubitus ulcers with eschar Neurologic: Moves both upper extremities and left leg, no localizing signs Psychiatric: Flat affect  Labs on Admission: I have personally reviewed following labs and imaging studies  CBC: Recent Labs  Lab 01/22/21 1035  WBC 17.6*  NEUTROABS 12.8*  HGB 7.1*  HCT 24.1*  MCV 84.9  PLT 641*   Basic Metabolic Panel: Recent Labs  Lab 01/22/21 1035  NA 136  K 5.4*  CL 102  CO2 21*  GLUCOSE 126*  BUN 60*  CREATININE 4.16*  CALCIUM 8.6*   GFR: CrCl cannot be calculated (Unknown ideal weight.). Liver Function Tests: Recent Labs  Lab 01/22/21 1035  AST 44*  ALT 21  ALKPHOS 91  BILITOT 0.5  PROT 7.5  ALBUMIN 1.7*   No results for input(s): LIPASE, AMYLASE in the last 168 hours. Recent Labs  Lab 01/22/21 1035  AMMONIA 20   Coagulation Profile: Recent Labs  Lab 01/22/21 1035  INR 1.2   Cardiac Enzymes: No  results for input(s): CKTOTAL, CKMB, CKMBINDEX, TROPONINI in the last 168 hours. BNP (last 3 results) No results for input(s): PROBNP in the last 8760 hours. HbA1C: No results for input(s): HGBA1C in the last 72 hours. CBG: Recent Labs  Lab 01/22/21 1101  GLUCAP 280*   Lipid Profile: No results for input(s): CHOL, HDL, LDLCALC, TRIG, CHOLHDL, LDLDIRECT in the last 72 hours. Thyroid Function Tests: No results for input(s): TSH, T4TOTAL, FREET4, T3FREE, THYROIDAB in the last 72 hours. Anemia Panel: No results for input(s): VITAMINB12, FOLATE, FERRITIN, TIBC, IRON, RETICCTPCT in the last 72 hours. Urine analysis: No results found for: COLORURINE, APPEARANCEUR, LABSPEC, PHURINE, GLUCOSEU, HGBUR, BILIRUBINUR, KETONESUR, PROTEINUR, UROBILINOGEN, NITRITE, LEUKOCYTESUR Sepsis Labs: @LABRCNTIP (procalcitonin:4,lacticidven:4) ) Recent Results (from the past 240 hour(s))  SARS Coronavirus 2 by RT PCR (hospital order, performed in [facility] hospital lab) Nasopharyngeal Nasopharyngeal Swab     Status: Abnormal   Collection Time: 01/22/21 10:35 AM   Specimen: Nasopharyngeal Swab  Result Value Ref Range Status   SARS Coronavirus 2 POSITIVE (A) NEGATIVE Final    Comment: RESULT CALLED TO, READ BACK BY AND VERIFIED WITH: REED RENO RN AT 1133 ON 01/22/21 SNG (NOTE) SARS-CoV-2 target nucleic acids are DETECTED  SARS-CoV-2 RNA is generally detectable in upper respiratory specimens  during the acute phase of infection.  Positive results are indicative  of the presence of the identified virus, but do not rule out bacterial infection or co-infection with other pathogens not detected by the test.  Clinical correlation with patient history and  other diagnostic information is necessary to determine patient infection status.  The expected result is negative.  Fact Sheet for Patients:   03/22/21   Fact Sheet for Healthcare Providers:    BoilerBrush.com.cy    This test is not yet approved or cleared by the https://pope.com/ FDA and  has been authorized for detection and/or diagnosis of SARS-CoV-2 by FDA under an Emergency Use Authorization (EUA).  This EUA will remain in effect (meaning this te st can be used) for the duration of  the COVID-19 declaration under Section 564(b)(1) of the Act, 21 U.S.C. section 360-bbb-3(b)(1), unless the authorization is terminated or revoked sooner.  Performed at [facility], [address], [place], [place]      Radiological Exams on Admission: DG Chest [facility] 1 View  Result Date: 01/22/2021 CLINICAL DATA:  Respiratory distress and altered mental status. EXAM: PORTABLE CHEST 1 VIEW COMPARISON:  None. FINDINGS: Atherosclerotic calcification of the aortic arch. Tortuous thoracic aorta. Heart size within normal limits for projection. Abnormal blunting of the left lateral costophrenic angle. Linear bandlike scarring peripherally in the left lung base. There is some hazy density at the left lung base, possibly from layering pleural fluid. Volume loss at the left lung base. The right lung appears clear. IMPRESSION: 1. Hazy density at the left lung base, potentially from layering pleural fluid. Blunted left lateral  costophrenic angle. 2. Scarring peripherally in the left lung base with some mild left basilar volume loss. 3. Tortuous thoracic aorta.  Aortic Atherosclerosis (ICD10-I70.0). Electronically Signed   By: Gaylyn Rong M.D.   On: 01/22/2021 11:23     Assessment/Plan    Acute Hypoxic resp failure -I suspect this is multifactorial -Seems like he has underlying COPD, is positive for SARS COVID-19 infection however x-ray not clearly consistent with this, history suggestive of dysphagia, aspiration pneumonia with left basilar opacity is a possibility -Has been quickly weaned down from a nonrebreather mask to 2 L nasal cannula now -Will obtain CT  chest without contrast -Empirically start IV remdesivir and Solu-Medrol -IV Unasyn for probable aspiration pneumonia -Check SLP evaluation and follow-up CT chest -de-escalate antibiotics based on above work-up   AKI (acute kidney injury) (HCC)  Hyperkalemia -Creatinine is 4.1 on admission, baseline is unknown, no labs in our system, he is followed by [name] [facility] in [place], labs and records requested -Hydrate with normal saline today, already left received a fluid bolus, also check renal ultrasound to rule out retention, hydronephrosis -Urinalysis pending  Normocytic anemia -Again baseline hemoglobin unknown -Check anemia panel -Transfuse if it drops below 7, no overt bleeding reported, suspect a degree of chronic blood loss from extensive sacral wounds  Large extensive and deep sacral decubitus wounds -Will request wound consult -Air overlay mattress  Severe protein calorie malnutrition -Add supplements as tolerated  Severe peripheral vascular disease Status post right AKA  COPD, history of tobacco abuse -No wheezing at this time, nebs PRN  History of CAD -No symptoms of ACS, continue aspirin, Coreg, statin  DVT prophylaxis: Heparin subcutaneous Code Status: Full code, recommended consideration of DNR, he will think about this Family Communication: Discussed with patient in detail, no family at bedside, I was unable to reach patient's brother Disposition Plan: Back to SNF when stable and improved Consults called: None Admission status: Inpatient  Zannie Cove MD [facility]  01/22/2021, 4:29 PM

## 2021-01-23 ENCOUNTER — Other Ambulatory Visit: Payer: Self-pay

## 2021-01-23 ENCOUNTER — Encounter: Payer: Self-pay | Admitting: Internal Medicine

## 2021-01-23 DIAGNOSIS — Z7189 Other specified counseling: Secondary | ICD-10-CM | POA: Diagnosis not present

## 2021-01-23 DIAGNOSIS — J189 Pneumonia, unspecified organism: Secondary | ICD-10-CM | POA: Diagnosis not present

## 2021-01-23 DIAGNOSIS — Z515 Encounter for palliative care: Secondary | ICD-10-CM

## 2021-01-23 DIAGNOSIS — R6521 Severe sepsis with septic shock: Secondary | ICD-10-CM | POA: Diagnosis not present

## 2021-01-23 DIAGNOSIS — U071 COVID-19: Secondary | ICD-10-CM | POA: Diagnosis not present

## 2021-01-23 DIAGNOSIS — A419 Sepsis, unspecified organism: Secondary | ICD-10-CM | POA: Diagnosis not present

## 2021-01-23 LAB — HEMOGLOBIN A1C
Hgb A1c MFr Bld: 6.2 % — ABNORMAL HIGH (ref 4.8–5.6)
Mean Plasma Glucose: 131.24 mg/dL

## 2021-01-23 LAB — COMPREHENSIVE METABOLIC PANEL
ALT: 18 U/L (ref 0–44)
AST: 37 U/L (ref 15–41)
Albumin: 1.5 g/dL — ABNORMAL LOW (ref 3.5–5.0)
Alkaline Phosphatase: 81 U/L (ref 38–126)
Anion gap: 15 (ref 5–15)
BUN: 55 mg/dL — ABNORMAL HIGH (ref 8–23)
CO2: 16 mmol/L — ABNORMAL LOW (ref 22–32)
Calcium: 8 mg/dL — ABNORMAL LOW (ref 8.9–10.3)
Chloride: 106 mmol/L (ref 98–111)
Creatinine, Ser: 3.29 mg/dL — ABNORMAL HIGH (ref 0.61–1.24)
GFR, Estimated: 19 mL/min — ABNORMAL LOW (ref >60)
Glucose, Bld: 206 mg/dL — ABNORMAL HIGH (ref 70–99)
Potassium: 4.8 mmol/L (ref 3.5–5.1)
Sodium: 137 mmol/L (ref 135–145)
Total Bilirubin: 0.5 mg/dL (ref 0.3–1.2)
Total Protein: 6.2 g/dL — ABNORMAL LOW (ref 6.5–8.1)

## 2021-01-23 LAB — CBC
HCT: 23.1 % — ABNORMAL LOW (ref 39.0–52.0)
Hemoglobin: 7 g/dL — ABNORMAL LOW (ref 13.0–17.0)
MCH: 25.5 pg — ABNORMAL LOW (ref 26.0–34.0)
MCHC: 30.3 g/dL (ref 30.0–36.0)
MCV: 84 fL (ref 80.0–100.0)
Platelets: 576 10*3/uL — ABNORMAL HIGH (ref 150–400)
RBC: 2.75 MIL/uL — ABNORMAL LOW (ref 4.22–5.81)
RDW: 19.3 % — ABNORMAL HIGH (ref 11.5–15.5)
WBC: 16.6 10*3/uL — ABNORMAL HIGH (ref 4.0–10.5)
nRBC: 0 % (ref 0.0–0.2)

## 2021-01-23 LAB — GLUCOSE, CAPILLARY
Glucose-Capillary: 199 mg/dL — ABNORMAL HIGH (ref 70–99)
Glucose-Capillary: 136 mg/dL — ABNORMAL HIGH (ref 70–99)
Glucose-Capillary: 341 mg/dL — ABNORMAL HIGH (ref 70–99)

## 2021-01-23 LAB — VITAMIN B12: Vitamin B-12: 948 pg/mL — ABNORMAL HIGH (ref 180–914)

## 2021-01-23 MED ORDER — COLLAGENASE 250 UNIT/GM EX OINT
TOPICAL_OINTMENT | Freq: Every day | CUTANEOUS | Status: DC
  Administered 2021-01-25 – 2021-01-26 (×2): 1 via TOPICAL
  Filled 2021-01-23 (×2): qty 30

## 2021-01-23 MED ORDER — INSULIN ASPART 100 UNIT/ML ~~LOC~~ SOLN
0.0000 [IU] | Freq: Three times a day (TID) | SUBCUTANEOUS | Status: DC
  Administered 2021-01-23: 1 [IU] via SUBCUTANEOUS
  Administered 2021-01-23 – 2021-01-24 (×2): 2 [IU] via SUBCUTANEOUS
  Administered 2021-01-24: 5 [IU] via SUBCUTANEOUS
  Administered 2021-01-24 – 2021-01-25 (×3): 2 [IU] via SUBCUTANEOUS
  Administered 2021-01-25: 3 [IU] via SUBCUTANEOUS
  Administered 2021-01-26: 2 [IU] via SUBCUTANEOUS
  Administered 2021-01-26: 3 [IU] via SUBCUTANEOUS
  Administered 2021-01-26: 1 [IU] via SUBCUTANEOUS
  Administered 2021-01-27: 2 [IU] via SUBCUTANEOUS
  Administered 2021-01-27: 5 [IU] via SUBCUTANEOUS
  Administered 2021-01-27 – 2021-01-28 (×2): 3 [IU] via SUBCUTANEOUS
  Administered 2021-01-28: 2 [IU] via SUBCUTANEOUS
  Filled 2021-01-23 (×14): qty 1

## 2021-01-23 MED ORDER — ADULT MULTIVITAMIN W/MINERALS CH
1.0000 | ORAL_TABLET | Freq: Every day | ORAL | Status: DC
  Administered 2021-01-24 – 2021-01-28 (×5): 1 via ORAL
  Filled 2021-01-23 (×5): qty 1

## 2021-01-23 MED ORDER — ASCORBIC ACID 500 MG PO TABS
500.0000 mg | ORAL_TABLET | Freq: Two times a day (BID) | ORAL | Status: DC
  Administered 2021-01-23 – 2021-01-28 (×10): 500 mg via ORAL
  Filled 2021-01-23 (×10): qty 1

## 2021-01-23 MED ORDER — SODIUM CHLORIDE 0.9 % IV SOLN
3.0000 g | INTRAVENOUS | Status: DC
  Filled 2021-01-23: qty 8

## 2021-01-23 MED ORDER — NEPRO/CARBSTEADY PO LIQD
237.0000 mL | Freq: Three times a day (TID) | ORAL | Status: DC
  Administered 2021-01-25 – 2021-01-28 (×11): 237 mL via ORAL

## 2021-01-23 NOTE — Progress Notes (Signed)
PROGRESS NOTE    Blake Villarreal.  NVV:[id number] DOB: 09-25-1946 DOA: 01/22/2021 PCP: Eloisa Northern, MD  Brief Narrative:Blake Villarreal. is a 75 y.o. male chronically ill nursing home resident with history of COPD, CAD severe peripheral vascular disease, right above-knee amputation, chronic sacral decubitus ulcers, chronic kidney disease stage unknown, chronic anemia, severe malnutrition, PTSD, chronic lymphedema was sent to ED 2/7 from [facility] due to respiratory distress patient reports being in his usual state of health yesterday,, he is bedbound and wheelchair-bound at baseline, reports feeling short short of breath x1 day, worsened over a few hours and became confused, EMS was called upon arrival his sats were noted to be around 40% on room air which quickly improved after a nonrebreather mask was placed, subsequently brought to the emergency room ED Course: Hypotensive with blood pressure in the 70s which improved to low 100s after a liter of saline bolus, was also initially placed on BiPAP for respiratory distress and quickly weaned down to 2 L nasal cannula, lab work-up was notable for leukocytosis, white count of 17.6, hemoglobin of 7.1, creatinine of 4.1, potassium of 5.4 SARS COVID-19 PCR was positive, chest x-ray concerning for possible hazy density of the left at the left base, probable layering pleural effusion, otherwise clear   Assessment & Plan:    Acute Hypoxic resp failure -I suspect this is multifactorial -Seems like he has underlying COPD, is positive for SARS COVID-19 infection however x-ray not clearly consistent with this, history suggestive of dysphagia, aspiration pneumonia with left basilar opacity is a possibility -was quickly weaned down from a nonrebreather mask to 2 L nasal cannula now -CT chest noted extensive emphysema, patchy groundglass airspace disease compatible with early infection and left lower lobe consolidation probable atelectasis -Continue  IV remdesivir and Solu-Medrol day 2 -Discontinue IV Unasyn, I do not see clear evidence of aspiration pneumonia on imaging   AKI (acute kidney injury) (HCC)  Hyperkalemia -Creatinine 4.1 on admission, baseline around 1.6 -1.9 -Improving with hydration, continue gentle hydration today -Renal ultrasound without hydronephrosis or obstruction  3 Large extensive and deep sacral decubitus wounds -Multiple large wounds including right sacrum which is 9X6X 4 cm with slough and black eschar, left ischium also chronic stage IV 8x5x3cm, R ischium-6x3x4cm -All wounds are stage IV, deep, 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 -Continue wound care, can consider CT with contrast if kidney function improves however I am certain 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, albumin is 1.5 -VERY Poor prognosis, doubt this will ever heal -called and relayed poor prognosis to his closed family ( Cousin -Lovie Chol) -Palliative consult appreciated  Normocytic anemia -Baseline hemoglobin around 7-8 -Hemoglobin 7.0 today, anemia panel suggestive of chronic disease and iron deficiency -Suspect chronic blood loss from extensive sacral decubitus wounds -Give IV iron  Severe protein calorie malnutrition -Albumin is 1.5 -Add supplements as tolerated  Severe peripheral vascular disease Status post right AKA  COPD, history of tobacco abuse -No wheezing at this time, nebs PRN  History of CAD -No symptoms of ACS, continue aspirin, Coreg, statin  DVT prophylaxis: Heparin subcutaneous Code Status: Full Code, strognly recommended consideration of DNR Family Communication: Discussed with patient in detail, no family at bedside, I was unable to reach patient's brother Disposition Plan: Back to SNF when stable and improved  Status is: Inpatient  Remains inpatient appropriate because:Inpatient level of care appropriate due to severity of illness   Dispo: The patient is from: SNF  Anticipated  d/c is to: SNF              Anticipated d/c date is: > 3 days              Patient currently is not medically stable to d/c.   Difficult to place patient No  Consultants:   Palliative medicine   Procedures:   Antimicrobials:    Subjective: -More alert today, still slow to respond to questions, denies any difficulty breathing this morning  Objective: Vitals:   01/23/21 0247 01/23/21 0330 01/23/21 0728 01/23/21 1058  BP:  118/78 (!) 117/50 139/65  Pulse:  84 77 84  Resp:   19 19  Temp:  98.2 F (36.8 C) 98.3 F (36.8 C) 97.7 F (36.5 C)  TempSrc:  Oral Oral   SpO2:  98% 100% 93%  Weight: 52.3 kg 52.3 kg    Height:        Intake/Output Summary (Last 24 hours) at 01/23/2021 1413 Last data filed at 01/23/2021 1103 Gross per 24 hour  Intake --  Output 50 ml  Net -50 ml   Filed Weights   01/22/21 2130 01/23/21 0247 01/23/21 0330  Weight: 49.7 kg 52.3 kg 52.3 kg    Examination:  General exam: Chronically ill, sitting up in bed, awake alert oriented to self and place, partly to time, no distress CVS: S1-S2, regular rate rhythm Lungs: Decreased breath sounds the bases, few scattered rhonchi Abdomen: Soft, nontender, bowel sounds present Extremities: Right AKA, left leg with chronic skin changes, hyperpigmentation and scaling Skin: Multiple large and deep sacral decubitus wounds with eschar Neuro: Moves both upper extremities and left leg, no localizing signs Psych: Flat affect   Data Reviewed:   CBC: Recent Labs  Lab 01/22/21 1035 01/22/21 2232 01/23/21 0649  WBC 17.6* 17.9* 16.6*  NEUTROABS 12.8*  --   --   HGB 7.1* 7.2* 7.0*  HCT 24.1* 23.3* 23.1*  MCV 84.9 84.1 84.0  PLT 641* 595* 576*   Basic Metabolic Panel: Recent Labs  Lab 01/22/21 1035 01/22/21 2232 01/23/21 0649  NA 136  --  137  K 5.4*  --  4.8  CL 102  --  106  CO2 21*  --  16*  GLUCOSE 126*  --  206*  BUN 60*  --  55*  CREATININE 4.16* 3.57* 3.29*  CALCIUM 8.6*  --  8.0*    GFR: Estimated Creatinine Clearance: 14.4 mL/min (A) (by C-G formula based on SCr of 3.29 mg/dL (H)). Liver Function Tests: Recent Labs  Lab 01/22/21 1035 01/23/21 0649  AST 44* 37  ALT 21 18  ALKPHOS 91 81  BILITOT 0.5 0.5  PROT 7.5 6.2*  ALBUMIN 1.7* 1.5*   No results for input(s): LIPASE, AMYLASE in the last 168 hours. Recent Labs  Lab 01/22/21 1035  AMMONIA 20   Coagulation Profile: Recent Labs  Lab 01/22/21 1035  INR 1.2   Cardiac Enzymes: No results for input(s): CKTOTAL, CKMB, CKMBINDEX, TROPONINI in the last 168 hours. BNP (last 3 results) No results for input(s): PROBNP in the last 8760 hours. HbA1C: No results for input(s): HGBA1C in the last 72 hours. CBG: Recent Labs  Lab 01/22/21 1101 01/23/21 1218  GLUCAP 280* 199*   Lipid Profile: No results for input(s): CHOL, HDL, LDLCALC, TRIG, CHOLHDL, LDLDIRECT in the last 72 hours. Thyroid Function Tests: No results for input(s): TSH, T4TOTAL, FREET4, T3FREE, THYROIDAB in the last 72 hours. Anemia Panel: Recent Labs    01/22/21  1923  VITAMINB12 948*  FOLATE 41.0  FERRITIN 440*  TIBC 102*  IRON 10*  RETICCTPCT 1.2   Urine analysis: No results found for: COLORURINE, APPEARANCEUR, LABSPEC, PHURINE, GLUCOSEU, HGBUR, BILIRUBINUR, KETONESUR, PROTEINUR, UROBILINOGEN, NITRITE, LEUKOCYTESUR Sepsis Labs: @LABRCNTIP (procalcitonin:4,lacticidven:4)  ) Recent Results (from the past 240 hour(s))  Blood culture (routine single)     Status: None (Preliminary result)   Collection Time: 01/22/21 10:35 AM   Specimen: BLOOD  Result Value Ref Range Status   Specimen Description BLOOD BLOOD RIGHT HAND  Final   Special Requests   Final    BOTTLES DRAWN AEROBIC AND ANAEROBIC Blood Culture results may not be optimal due to an inadequate volume of blood received in culture bottles   Culture   Final    NO GROWTH < 24 HOURS Performed at [facility], [address] [place], [place]    Report  Status PENDING  Incomplete  SARS Coronavirus 2 by RT PCR (hospital order, performed in [facility] hospital lab) Nasopharyngeal Nasopharyngeal Swab     Status: Abnormal   Collection Time: 01/22/21 10:35 AM   Specimen: Nasopharyngeal Swab  Result Value Ref Range Status   SARS Coronavirus 2 POSITIVE (A) NEGATIVE Final    Comment: RESULT CALLED TO, READ BACK BY AND VERIFIED WITH: REED RENO RN AT 1133 ON 01/22/21 SNG (NOTE) SARS-CoV-2 target nucleic acids are DETECTED  SARS-CoV-2 RNA is generally detectable in upper respiratory specimens  during the acute phase of infection.  Positive results are indicative  of the presence of the identified virus, but do not rule out bacterial infection or co-infection with other pathogens not detected by the test.  Clinical correlation with patient history and  other diagnostic information is necessary to determine patient infection status.  The expected result is negative.  Fact Sheet for Patients:   03/22/21   Fact Sheet for Healthcare Providers:   BoilerBrush.com.cy    This test is not yet approved or cleared by the https://pope.com/ FDA and  has been authorized for detection and/or diagnosis of SARS-CoV-2 by FDA under an Emergency Use Authorization (EUA).  This EUA will remain in effect (meaning this te st can be used) for the duration of  the COVID-19 declaration under Section 564(b)(1) of the Act, 21 U.S.C. section 360-bbb-3(b)(1), unless the authorization is terminated or revoked sooner.  Performed at [facility], [address] [place], [place]          Radiology Studies: CT CHEST WO CONTRAST  Result Date: 01/22/2021 CLINICAL DATA:  Respiratory failure, COVID-19 positive, renal insufficiency, diabetes EXAM: CT CHEST WITHOUT CONTRAST TECHNIQUE: Multidetector CT imaging of the chest was performed following the standard protocol without IV contrast. COMPARISON:   01/22/2021 FINDINGS: Cardiovascular: Unenhanced imaging of the heart and great vessels demonstrates no significant pericardial effusion. No evidence of thoracic aortic aneurysm. Evaluation of the aortic lumen is limited without IV contrast. Diffuse atherosclerosis of the aorta and coronary vasculature. Mediastinum/Nodes: No enlarged mediastinal or axillary lymph nodes. Thyroid gland, trachea, and esophagus demonstrate no significant findings. Lungs/Pleura: Upper lobe predominant emphysema. There are scattered areas of subpleural ground-glass airspace disease within the right upper and right middle lobes, compatible with early infection. There is circumferential left pleural thickening and calcification, consistent with prior surgery or trauma. Rounded consolidation within the left lower lobe most compatible with rounded atelectasis. No effusion or pneumothorax. There is mucoid material within the right mainstem bronchus. Right lower lobe bronchial wall thickening is identified consistent with  bronchitis or reactive airway disease. Upper Abdomen: No acute abnormality. Musculoskeletal: No acute or destructive bony lesions. IMPRESSION: 1. Upper lobe predominant emphysema, with patchy right upper and right middle lobe subpleural ground-glass airspace disease compatible with early infection or inflammation. 2. Right lower lobe bronchial wall thickening consistent with bronchitis or reactive airway disease. 3. Left pleural thickening and calcification consistent with previous trauma or surgery. Rounded left lower lobe consolidation most compatible with rounded atelectasis. 4. Aortic Atherosclerosis (ICD10-I70.0) and Emphysema (ICD10-J43.9). Electronically Signed   By: Sharlet Salina M.D.   On: 01/22/2021 16:58   US RENAL  Result Date: 01/22/2021 CLINICAL DATA:  Acute kidney injury EXAM: RENAL / URINARY TRACT ULTRASOUND COMPLETE COMPARISON:  None. FINDINGS: Right Kidney: Renal measurements: 10.7 x 4.5 x 4.5 cm = volume:  112.6 mL. Diffusely increased renal cortical echogenicity. 9.6 mm shadowing calculus is seen upper pole. There is mild pelviectasis without calyceal dilatation. No frank hydronephrosis. No concerning renal mass. Left Kidney: Renal measurements: 10.5 x 5.9 x 5.3 cm = volume: 170 mL. Diffusely increased renal cortical echogenicity. 9.4 mm shadowing calculus in the interpolar left kidney. No significant urinary tract dilatation. No concerning renal mass. Bladder: Appears normal for degree of bladder distention. Bilateral bladder jets are identified. Other: None. IMPRESSION: 1. Diffusely increased renal cortical echogenicity compatible with medical renal disease. 2. Bilateral nonobstructing renal calculi. 3. Mild right pelviectasis without frank hydronephrosis. Presence of a visible right bladder jet argues against the presence of obstruction, may reflect extrarenal pelvis or benign incidental. Electronically Signed   By: Kreg Shropshire M.D.   On: 01/22/2021 17:24   DG Chest Port 1 View  Result Date: 01/22/2021 CLINICAL DATA:  Respiratory distress and altered mental status. EXAM: PORTABLE CHEST 1 VIEW COMPARISON:  None. FINDINGS: Atherosclerotic calcification of the aortic arch. Tortuous thoracic aorta. Heart size within normal limits for projection. Abnormal blunting of the left lateral costophrenic angle. Linear bandlike scarring peripherally in the left lung base. There is some hazy density at the left lung base, possibly from layering pleural fluid. Volume loss at the left lung base. The right lung appears clear. IMPRESSION: 1. Hazy density at the left lung base, potentially from layering pleural fluid. Blunted left lateral costophrenic angle. 2. Scarring peripherally in the left lung base with some mild left basilar volume loss. 3. Tortuous thoracic aorta.  Aortic Atherosclerosis (ICD10-I70.0). Electronically Signed   By: Gaylyn Rong M.D.   On: 01/22/2021 11:23        Scheduled Meds: . vitamin C   500 mg Oral BID  . carvedilol  6.25 mg Oral BID WC  . collagenase   Topical Daily  . feeding supplement (NEPRO CARB STEADY)  237 mL Oral TID WC  . heparin  5,000 Units Subcutaneous Q8H  . insulin aspart  0-9 Units Subcutaneous TID WC  . methylPREDNISolone (SOLU-MEDROL) injection  60 mg Intravenous Q12H  . [START ON 01/24/2021] multivitamin with minerals  1 tablet Oral Daily   Continuous Infusions: . sodium chloride 75 mL/hr at 01/22/21 2345  . ampicillin-sulbactam (UNASYN) IV    . remdesivir 100 mg in NS 100 mL 100 mg (01/23/21 1014)     LOS: 1 day    Time spent:  Zannie Cove, MD [facility] 01/23/2021, 2:13 PM

## 2021-01-23 NOTE — Consult Note (Addendum)
WOC Nurse Consult Note: Reason for Consult: Consult requested for several wounds. Pt is very emaciated with multiple systemic factors which can impair healing.  They are in isolation for Covid. Wound type: Sacrum with chronic Stage 4 pressure injury; 9X6X4cm, 60% red, 40% yellow slough, black eschar, mod amt tan drainage, bone palpable. Left ischium with chronic Stage 4 pressure injury; 8X5X3cm, 70% red, 30% yellow slough, black eschar, mod amt tan drainage, bone palpable. Right ischium with chronic Stage 4 pressure injury; 6X3X4cm, 85% red, 15% yellow slough, black eschar, mod amt tan drainage, bone palpable. Posterior scrotum is red, moist and macerated with chronic full thickness skin, painful to touch, approx 10X10X.2cm Pressure Injury POA: Yes Dressing procedure/placement/frequency: Air mattress has been ordered to reduce pressure. If aggressive plan of care is desired; then pt could benefit from CT scan to R/O osteomyelitis; secure chat message sent to the primary team to inform them of this information. Topical treatment orders provided for bedside nurses to perform as follows to assist with enzymatic debridement of nonviable tissue: Apply Santyl to bilat ischium and sacrum wounds Q day, then cover with moist fluffed gauze and ABD pad and tape Apply xeroform gauze to posterior scrotum/perineum Q day  WOC Nurse ostomy consult note Stoma type/location:  Pt has a colostomy; pt does not answer questions and requires total assistance with ostomy procedures. Stomal assessment/size: Stoma is red and viable, flush with skin level, 3/4 inch Peristomal assessment: intact skin surrounding Output: mod amt thick brown stool  Ostomy pouching: 2pc.  Applied 2 piece ostomy pouching system.Extra supplies left at the bedside for staff nurse use.  Please re-consult if further assistance is needed.  Thank-you,  Cammie Mcgee MSN, RN, CWOCN, [place], CNS [phone]

## 2021-01-23 NOTE — Progress Notes (Signed)
Initial Nutrition Assessment  DOCUMENTATION CODES:   Severe malnutrition in context of chronic illness  INTERVENTION:   Nepro Shake po TID, each supplement provides 425 kcal and 19 grams protein  MVI daily   Vitamin C 526m po BID   NUTRITION DIAGNOSIS:   Severe Malnutrition related to chronic illness (COPD, chronic wounds) as evidenced by severe fat depletion,severe muscle depletion.  GOAL:   Patient will meet greater than or equal to 90% of their needs  MONITOR:   PO intake,Supplement acceptance,Labs,Weight trends,Skin,I & O's  REASON FOR ASSESSMENT:   Malnutrition Screening Tool    ASSESSMENT:   75y.o. male nursing home resident with history of COPD, CAD, severe peripheral vascular disease, right above-knee amputation, chronic sacral decubitus ulcers, chronic kidney disease stage unknown, chronic anemia, severe malnutrition, PTSD, chronic lymphedema and bedbound at baseline who is admitted with COVID 19   Met with pt in room today. Pt is a poor historian but reports poor appetite and oral intake at baseline. Pt reports that he ate his lunch today but pt's lunch tray was sitting on his side table with only bites taken from it. Pt does report that he drinks vanilla Ensure at home. RD will add supplements and vitamins to help pt meet his estimated needs and support wound healing. Pt is at high refeed risk. There is no documented weight history in chart to determine if any significant weight changes.   Medications reviewed and include: heparin, insulin, solu-medrol, NaCl @75ml /hr, unasyn  Labs reviewed: BUN 55(H), creat 3.29(H) Wbc- 16.6(H), Hgb 7.0(L), Hct 23.1(L) cbgs- 280, 199 x 24 hrs  NUTRITION - FOCUSED PHYSICAL EXAM:  Flowsheet Row Most Recent Value  Orbital Region Severe depletion  Upper Arm Region Severe depletion  Thoracic and Lumbar Region Severe depletion  Buccal Region Severe depletion  Temple Region Severe depletion  Clavicle Bone Region Severe  depletion  Clavicle and Acromion Bone Region Severe depletion  Scapular Bone Region Severe depletion  Dorsal Hand Severe depletion  Patellar Region Severe depletion  Anterior Thigh Region Severe depletion  Posterior Calf Region Severe depletion  Edema (RD Assessment) None  Hair Reviewed  Eyes Reviewed  Mouth Reviewed  Skin Reviewed  Nails Reviewed     Diet Order:   Diet Order            Diet Carb Modified Fluid consistency: Thin; Room service appropriate? Yes  Diet effective now                EDUCATION NEEDS:   Education needs have been addressed  Skin:  Skin Assessment: Reviewed RN Assessment   Sacrum with chronic Stage 4 pressure injury; 9X6X4cm, 60% red, 40% yellow slough, black eschar, mod amt tan drainage, bone palpable. Left ischium with chronic Stage 4 pressure injury; 8X5X3cm, 70% red, 30% yellow slough, black eschar, mod amt tan drainage, bone palpable. Right ischium with chronic Stage 4 pressure injury; 6X3X4cm, 85% red, 15% yellow slough, black eschar, mod amt tan drainage, bone palpable. Posterior scrotum is red, moist and macerated with chronic full thickness skin, painful to touch, approx 10X10X.2cm  Last BM:  colostomy  Height:   Ht Readings from Last 1 Encounters:  01/22/21 5' 9"  (1.753 m)    Weight:   Wt Readings from Last 1 Encounters:  01/23/21 52.3 kg    Ideal Body Weight:  64 kg (adjusted for AKA)  BMI:  Body mass index is 17.03 kg/m.  Estimated Nutritional Needs:   Kcal:  1800-2100kcal/day  Protein:  90-105g/day  Fluid:  1.3-1.6L/day  Koleen Distance MS, RD, LDN Please refer to [facility] for RD and/or RD on-call/weekend/after hours pager

## 2021-01-23 NOTE — Progress Notes (Signed)
Inpatient Diabetes Program Recommendations  AACE/ADA: New Consensus Statement on Inpatient Glycemic Control (2015)  Target Ranges:  Prepandial:   less than 140 mg/dL      Peak postprandial:   less than 180 mg/dL (1-2 hours)      Critically ill patients:  140 - 180 mg/dL   Lab Results  Component Value Date   GLUCAP 280 (H) 01/22/2021    Review of Glycemic Control  Diabetes history: DM2 Outpatient Diabetes medications: None Current orders for Inpatient glycemic control: None  Inpatient Diabetes Program Recommendations:   While in the hospital on steroids: -Glycemic control order set with 0-9 units correction tid + hs 0-5 units Secure chat sent to Dr. Jomarie Longs  Thank you, Blake Villarreal. Blake Mesta, RN, MSN, CDE  Diabetes Coordinator Inpatient Glycemic Control Team Team Pager [phone] (8am-5pm) 01/23/2021 10:37 AM

## 2021-01-23 NOTE — Consult Note (Addendum)
Consultation Note Date: 01/23/2021   Patient Name: Blake Villarreal.  DOB: 06-28-1946  MRN: [id number]  Age / Sex: 75 y.o., male  PCP: Eloisa Northern, MD Referring Physician: Zannie Cove, MD  Reason for Consultation: Establishing goals of care  HPI/Patient Profile: Blake Villarrealis a 75 y.o.malechronically ill nursing home resident with history of COPD, CAD severe peripheral vascular disease, right above-knee amputation, chronic sacral decubitus ulcers, chronic kidney disease stage unknown, chronic anemia, severe malnutrition, PTSD, chronic lymphedema was sent to ED 2/7 from [facility] due to respiratory distress.  Clinical Assessment and Goals of Care: Patient is on covid isolation. He is unable to participate in conversations such as GOC due to mental status.  Spoke with his cousin. His cousin Blake Villarreal advises she is not his legal HPOA, he completed paperwork through the [name] system to make her his decision maker when he is with them. She states he has 3 children and a brother, but she is the one that takes him places and helps with day to day needs. Attending conferenced into the conversation temporarily by Blake Villarreal and updates were provided.   We discussed his status. He lived in a facility which was paid for by the [name], and made the choice to leave because he wanted to smoke. She states he now pays $10,000+ a month for his current facility because they allow him to go outside and smoke. She states smoking is the only important thing to him. She states if he is outside smoking and it is time for a dressing change, sometimes he will not go back in for the dressing change.   Dicussed need for speaking with his children as they are his legal decision makers. She conferenced in one of his daughters. Daughter states the childen do not live around here. She states she would like to assist with decision  making. Discussed his status. She also states smoking is his quality of life. They discuss that he has said several times  "look at me, smoking is the only thing in life I have left. Why is everybody trying to take it away from me?"   We discussed his diagnoses, prognosis, GOC, and options.  A detailed discussion was had today regarding advanced directives.  Concepts specific to code status, artifical feeding and hydration, IV antibiotics and rehospitalization were discussed.  The difference between an aggressive medical intervention path and a comfort care path was discussed.  Values and goals of care important to patient and family were attempted to be elicited.  Discussed limitations of medical interventions to prolong quality of life in some situations and discussed the concept of human mortality.  Blake Villarreal states patient has said before when he was very sick, he would only want to be on the ventilator for 7 days and would want to be shifted to comfort. Discussed code status. They will speak as a family regarding these decisions.        SUMMARY OF RECOMMENDATIONS   GOC conversations initiated with daughter. Will work  on speaking with other children.    Prognosis:   Poor      Primary Diagnoses: Present on Admission:  AKI (acute kidney injury) (HCC)  Acute on chronic respiratory failure with hypoxia (HCC)  Decubitus ulcer of sacral region, stage 4 (HCC)  Severe protein-energy malnutrition (HCC)  Hypoxia   I have reviewed the medical record, interviewed the patient and family, and examined the patient. The following aspects are pertinent.  Past Medical History:  Diagnosis Date   Anemia    CKD (chronic kidney disease)    COPD (chronic obstructive pulmonary disease) (HCC)    Coronary artery disease    Diabetes mellitus without complication (HCC)    Dyspnea    Hx of AKA (above knee amputation), left (HCC)    Hypertension    Pneumonia    Pressure ulcer, sacrum     PTSD (post-traumatic stress disorder)    Social History   Socioeconomic History   Marital status: Single    Spouse name: Not on file   Number of children: Not on file   Years of education: Not on file   Highest education level: Not on file  Occupational History   Not on file  Tobacco Use   Smoking status: Former Smoker    Types: Cigarettes   Smokeless tobacco: Former Forensic psychologist Use: Never used  Substance and Sexual Activity   Alcohol use: Never   Drug use: Never   Sexual activity: Not Currently  Other Topics Concern   Not on file  Social History Narrative   Not on file   Social Determinants of Health   Financial Resource Strain: Not on file  Food Insecurity: Not on file  Transportation Needs: Not on file  Physical Activity: Not on file  Stress: Not on file  Social Connections: Not on file   History reviewed. No pertinent family history. Scheduled Meds:  vitamin C  500 mg Oral BID   carvedilol  6.25 mg Oral BID WC   collagenase   Topical Daily   feeding supplement (NEPRO CARB STEADY)  237 mL Oral TID WC   heparin  5,000 Units Subcutaneous Q8H   insulin aspart  0-9 Units Subcutaneous TID WC   methylPREDNISolone (SOLU-MEDROL) injection  60 mg Intravenous Q12H   [START ON 01/24/2021] multivitamin with minerals  1 tablet Oral Daily   Continuous Infusions:  sodium chloride 75 mL/hr at 01/22/21 2345   remdesivir 100 mg in NS 100 mL 100 mg (01/23/21 1014)   PRN Meds:.acetaminophen **OR** acetaminophen, ondansetron **OR** ondansetron (ZOFRAN) IV Medications Prior to Admission:  Prior to Admission medications   Medication Sig Start Date End Date Taking? Authorizing Provider  amLODipine (NORVASC) 10 MG tablet Take 10 mg by mouth daily.   Yes [provider]  atorvastatin (LIPITOR) 40 MG tablet Take 40 mg by mouth every evening.   Yes [provider]  carvedilol (COREG) 12.5 MG tablet Take 12.5 mg by mouth every 12  (twelve) hours.   Yes [provider]  chlorthalidone (HYGROTON) 25 MG tablet Take 25 mg by mouth daily.   Yes [provider]  doxycycline (VIBRA-TABS) 100 MG tablet Take 100 mg by mouth 2 (two) times daily.   Yes [provider]  folic acid (FOLVITE) 1 MG tablet Take 1 mg by mouth daily.   Yes [provider]  gabapentin (NEURONTIN) 100 MG capsule Take 200 mg by mouth every 8 (eight) hours.   Yes [provider]  metFORMIN (GLUCOPHAGE) 500 MG tablet Take 500 mg by mouth daily with breakfast.   Yes [provider]  mirtazapine (REMERON) 7.5 MG tablet Take 7.5 mg by mouth at bedtime.   Yes [provider]  omeprazole (PRILOSEC) 20 MG capsule Take 20 mg by mouth 2 (two) times daily before a meal.   Yes [provider]  sertraline (ZOLOFT) 50 MG tablet Take 50 mg by mouth daily.   Yes [provider]  tamsulosin (FLOMAX) 0.4 MG CAPS capsule Take 0.4 mg by mouth.   Yes [provider]   No Known Allergies  Vital Signs: BP 139/65 (BP Location: Left Arm)    Pulse 84    Temp 97.7 F (36.5 C)    Resp 19    Ht 5\' 9"  (1.753 m)    Wt 52.3 kg    SpO2 93%    BMI 17.03 kg/m  Pain Scale: 0-10   Pain Score: 0-No pain   SpO2: SpO2: 93 % O2 Device:SpO2: 93 % O2 Flow Rate: .O2 Flow Rate (L/min): 2 L/min  IO: Intake/output summary:   Intake/Output Summary (Last 24 hours) at 01/23/2021 1456 Last data filed at 01/23/2021 1103 Gross per 24 hour  Intake --  Output 50 ml  Net -50 ml    LBM:   Baseline Weight: Weight: 65.8 kg Most recent weight: Weight: 52.3 kg        Time In: 3:00 Time Out: 3:50 Time Total: 50 min Greater than 50%  of this time was spent counseling and coordinating care related to the above assessment and plan.  COVID-19 DISASTER DECLARATION:    FULL CONTACT PHYSICAL EXAMINATION WAS NOT POSSIBLE DUE TO TREATMENT OF COVID-19  AND CONSERVATION OF PERSONAL PROTECTIVE EQUIPMENT   Patient  assessed or the symptoms described in the history of present illness and daily progress notes.  In the context of the Global COVID-19 pandemic, which necessitated consideration that the patient might be at risk for infection with the SARS-CoV-2 virus that causes COVID-19, Institutional protocols and algorithms that pertain to the evaluation of patients at risk for COVID-19 are in a state of rapid change based on information released by regulatory bodies including the CDC and federal and state organizations. These policies and algorithms were followed during the patient's care while in hospital.  Signed by: 03/23/2021, NP   Please contact Palliative Medicine Team phone at [phone] for questions and concerns.  For individual provider: See [phone]

## 2021-01-24 DIAGNOSIS — J9621 Acute and chronic respiratory failure with hypoxia: Secondary | ICD-10-CM | POA: Diagnosis not present

## 2021-01-24 LAB — COMPREHENSIVE METABOLIC PANEL
ALT: 21 U/L (ref 0–44)
AST: 31 U/L (ref 15–41)
Albumin: 1.5 g/dL — ABNORMAL LOW (ref 3.5–5.0)
Alkaline Phosphatase: 77 U/L (ref 38–126)
Anion gap: 11 (ref 5–15)
BUN: 50 mg/dL — ABNORMAL HIGH (ref 8–23)
CO2: 19 mmol/L — ABNORMAL LOW (ref 22–32)
Calcium: 8.2 mg/dL — ABNORMAL LOW (ref 8.9–10.3)
Chloride: 107 mmol/L (ref 98–111)
Creatinine, Ser: 2.61 mg/dL — ABNORMAL HIGH (ref 0.61–1.24)
GFR, Estimated: 25 mL/min — ABNORMAL LOW (ref >60)
Glucose, Bld: 223 mg/dL — ABNORMAL HIGH (ref 70–99)
Potassium: 4.4 mmol/L (ref 3.5–5.1)
Sodium: 137 mmol/L (ref 135–145)
Total Bilirubin: 0.4 mg/dL (ref 0.3–1.2)
Total Protein: 6.4 g/dL — ABNORMAL LOW (ref 6.5–8.1)

## 2021-01-24 LAB — CBC
HCT: 22.9 % — ABNORMAL LOW (ref 39.0–52.0)
Hemoglobin: 7.2 g/dL — ABNORMAL LOW (ref 13.0–17.0)
MCH: 25.4 pg — ABNORMAL LOW (ref 26.0–34.0)
MCHC: 31.4 g/dL (ref 30.0–36.0)
MCV: 80.9 fL (ref 80.0–100.0)
Platelets: 601 10*3/uL — ABNORMAL HIGH (ref 150–400)
RBC: 2.83 MIL/uL — ABNORMAL LOW (ref 4.22–5.81)
RDW: 19.5 % — ABNORMAL HIGH (ref 11.5–15.5)
WBC: 12.5 10*3/uL — ABNORMAL HIGH (ref 4.0–10.5)
nRBC: 0 % (ref 0.0–0.2)

## 2021-01-24 LAB — GLUCOSE, CAPILLARY
Glucose-Capillary: 197 mg/dL — ABNORMAL HIGH (ref 70–99)
Glucose-Capillary: 161 mg/dL — ABNORMAL HIGH (ref 70–99)
Glucose-Capillary: 294 mg/dL — ABNORMAL HIGH (ref 70–99)
Glucose-Capillary: 151 mg/dL — ABNORMAL HIGH (ref 70–99)

## 2021-01-24 NOTE — Progress Notes (Signed)
PROGRESS NOTE    Blake Villarreal.  OHY:[id number] DOB: [birth year] DOA: 01/22/2021 PCP: Eloisa Northern, MD  Brief Narrative:Blake Villarreal. is a 75 y.o. male chronically ill nursing home resident with history of COPD, CAD severe peripheral vascular disease, right above-knee amputation, chronic sacral decubitus ulcers, chronic kidney disease stage unknown, chronic anemia, severe malnutrition, PTSD, chronic lymphedema was sent to ED 2/7 from [facility] due to respiratory distress patient reports being in his usual state of health yesterday,, he is bedbound and wheelchair-bound at baseline, reports feeling short short of breath x1 day, worsened over a few hours and became confused, EMS was called upon arrival his sats were noted to be around 40% on room air which quickly improved after a nonrebreather mask was placed, subsequently brought to the emergency room ED Course: Hypotensive with blood pressure in the 70s which improved to low 100s after a liter of saline bolus, was also initially placed on BiPAP for respiratory distress and quickly weaned down to 2 L nasal cannula, lab work-up was notable for leukocytosis, white count of 17.6, hemoglobin of 7.1, creatinine of 4.1, potassium of 5.4 SARS COVID-19 PCR was positive, chest x-ray concerning for possible hazy density of the left at the left base, probable layering pleural effusion, otherwise clear   Assessment & Plan:   Acute Hypoxic resp failure, multifactorial, POA - Concurrent COPD exacerbation likely triggered by COVID-19 without clear indication for acute viral pneumonia - CT chest noted extensive emphysema, patchy groundglass airspace disease compatible with early infection and left lower lobe consolidation probable atelectasis - Continue IV remdesivir and Solu-Medrol for shortened duration given symptoms/rapid improvement  AKI (acute kidney injury) (HCC) Hyperkalemia - Creatinine 4.1 on admission, baseline around 1.6 -1.9 -  Downtrending appropriately Lab Results  Component Value Date   CREATININE 2.61 (H) 01/24/2021   CREATININE 3.29 (H) 01/23/2021   CREATININE 3.57 (H) 01/22/2021   Large extensive and deep sacral decubitus wounds - Multiple large wounds including right sacrum which is 9X6X 4 cm with slough and black eschar, left ischium also chronic stage IV 8x5x3cm, R ischium-6x3x4cm - All wounds are stage IV, deep, 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 - Continue wound care, can consider CT with contrast if kidney function improves however I am certain 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, albumin is 1.5 - VERY Poor prognosis, doubt this will ever heal - Palliative consult appreciated  Normocytic anemia - Baseline hemoglobin around 7-8 - Hemoglobin 7.0 today, anemia panel suggestive of chronic disease and iron deficiency - Suspect chronic blood loss from extensive sacral decubitus wounds - Give IV iron  Severe protein calorie malnutrition, chronic - Albumin is 1.5 - Add supplements as tolerated - Family indicates he is a very poor eater at baseline, poor prognosis if his caloric intake continues to be profoundly poor  Severe peripheral vascular disease - Status post right AKA  COPD, history of tobacco abuse - No wheezing at this time, nebs PRN  History of CAD - No symptoms of ACS, continue aspirin, coreg, statin  DVT prophylaxis: Heparin subcutaneous Code Status: Full Code Family Communication: Discussed with [name], cousin in detail Disposition Plan: Back to SNF when stable and improved  Status is: Inpatient  Remains inpatient appropriate because:Inpatient level of care appropriate due to severity of illness   Dispo: The patient is from: SNF              Anticipated d/c is to: SNF  Anticipated d/c date is: > 3 days              Patient currently is not medically stable to d/c.   Difficult to place patient No  Consultants:   Palliative medicine   Procedures:    Antimicrobials:    Subjective: No acute issues or events overnight, pleasantly resting in bed attempting to eat breakfast but denies any nausea vomiting diarrhea constipation headache fevers chest pain or shortness of breath.  Objective: Vitals:   01/23/21 1643 01/23/21 2111 01/24/21 0500 01/24/21 0740  BP: 127/75 (!) 128/59 130/62 (!) 115/48  Pulse: 72 70 70 69  Resp: 18 17 20 20   Temp: 97.7 F (36.5 C) 98.3 F (36.8 C) 98 F (36.7 C) 98.4 F (36.9 C)  TempSrc:  Oral    SpO2: 99% 99% 99% 100%  Weight:      Height:        Intake/Output Summary (Last 24 hours) at 01/24/2021 03/24/2021 Last data filed at 01/24/2021 03/24/2021 Gross per 24 hour  Intake 600.33 ml  Output 50 ml  Net 550.33 ml   Filed Weights   01/22/21 2130 01/23/21 0247 01/23/21 0330  Weight: 49.7 kg 52.3 kg 52.3 kg    Examination:  General exam: Chronically ill, sitting up in bed, awake alert oriented to self place only, no distress CVS: S1-S2, regular rate rhythm Lungs: Decreased breath sounds the bases, few scattered rhonchi Abdomen: Soft, nontender, bowel sounds present Extremities: Right AKA, left leg with chronic skin changes, hyperpigmentation and scaling Skin: Multiple large and deep sacral decubitus wounds with eschar Neuro: Moves both upper extremities and left leg, no focal deficits  Data Reviewed:   CBC: Recent Labs  Lab 01/22/21 1035 01/22/21 2232 01/23/21 0649 01/24/21 0613  WBC 17.6* 17.9* 16.6* 12.5*  NEUTROABS 12.8*  --   --   --   HGB 7.1* 7.2* 7.0* 7.2*  HCT 24.1* 23.3* 23.1* 22.9*  MCV 84.9 84.1 84.0 80.9  PLT 641* 595* 576* 601*   Basic Metabolic Panel: Recent Labs  Lab 01/22/21 1035 01/22/21 2232 01/23/21 0649 01/24/21 0613  NA 136  --  137 137  K 5.4*  --  4.8 4.4  CL 102  --  106 107  CO2 21*  --  16* 19*  GLUCOSE 126*  --  206* 223*  BUN 60*  --  55* 50*  CREATININE 4.16* 3.57* 3.29* 2.61*  CALCIUM 8.6*  --  8.0* 8.2*   GFR: Estimated Creatinine Clearance: 18.1  mL/min (A) (by C-G formula based on SCr of 2.61 mg/dL (H)). Liver Function Tests: Recent Labs  Lab 01/22/21 1035 01/23/21 0649 01/24/21 0613  AST 44* 37 31  ALT 21 18 21   ALKPHOS 91 81 77  BILITOT 0.5 0.5 0.4  PROT 7.5 6.2* 6.4*  ALBUMIN 1.7* 1.5* 1.5*   No results for input(s): LIPASE, AMYLASE in the last 168 hours. Recent Labs  Lab 01/22/21 1035  AMMONIA 20   Coagulation Profile: Recent Labs  Lab 01/22/21 1035  INR 1.2   Cardiac Enzymes: No results for input(s): CKTOTAL, CKMB, CKMBINDEX, TROPONINI in the last 168 hours. BNP (last 3 results) No results for input(s): PROBNP in the last 8760 hours. HbA1C: Recent Labs    01/23/21 0649  HGBA1C 6.2*   CBG: Recent Labs  Lab 01/22/21 1101 01/23/21 1218 01/23/21 1643 01/23/21 2114 01/24/21 0756  GLUCAP 280* 199* 136* 341* 197*   Lipid Profile: No results for input(s): CHOL, HDL, LDLCALC, TRIG, CHOLHDL,  LDLDIRECT in the last 72 hours. Thyroid Function Tests: No results for input(s): TSH, T4TOTAL, FREET4, T3FREE, THYROIDAB in the last 72 hours. Anemia Panel: Recent Labs    01/22/21 1923  VITAMINB12 948*  FOLATE 41.0  FERRITIN 440*  TIBC 102*  IRON 10*  RETICCTPCT 1.2   Urine analysis: No results found for: COLORURINE, APPEARANCEUR, LABSPEC, PHURINE, GLUCOSEU, HGBUR, BILIRUBINUR, KETONESUR, PROTEINUR, UROBILINOGEN, NITRITE, LEUKOCYTESUR Sepsis Labs: @LABRCNTIP (procalcitonin:4,lacticidven:4)  ) Recent Results (from the past 240 hour(s))  Blood culture (routine single)     Status: None (Preliminary result)   Collection Time: 01/22/21 10:35 AM   Specimen: BLOOD  Result Value Ref Range Status   Specimen Description BLOOD BLOOD RIGHT HAND  Final   Special Requests   Final    BOTTLES DRAWN AEROBIC AND ANAEROBIC Blood Culture results may not be optimal due to an inadequate volume of blood received in culture bottles   Culture   Final    NO GROWTH 2 DAYS Performed at [facility], [address] [place], [place] [address]    Report Status PENDING  Incomplete  SARS Coronavirus 2 by RT PCR (hospital order, performed in [facility] hospital lab) Nasopharyngeal Nasopharyngeal Swab     Status: Abnormal   Collection Time: 01/22/21 10:35 AM   Specimen: Nasopharyngeal Swab  Result Value Ref Range Status   SARS Coronavirus 2 POSITIVE (A) NEGATIVE Final    Comment: RESULT CALLED TO, READ BACK BY AND VERIFIED WITH: REED RENO RN AT 1133 ON 01/22/21 SNG (NOTE) SARS-CoV-2 target nucleic acids are DETECTED  SARS-CoV-2 RNA is generally detectable in upper respiratory specimens  during the acute phase of infection.  Positive results are indicative  of the presence of the identified virus, but do not rule out bacterial infection or co-infection with other pathogens not detected by the test.  Clinical correlation with patient history and  other diagnostic information is necessary to determine patient infection status.  The expected result is negative.  Fact Sheet for Patients:   BoilerBrush.com.cy   Fact Sheet for Healthcare Providers:   https://pope.com/    This test is not yet approved or cleared by the [place] FDA and  has been authorized for detection and/or diagnosis of SARS-CoV-2 by FDA under an Emergency Use Authorization (EUA).  This EUA will remain in effect (meaning this te st can be used) for the duration of  the COVID-19 declaration under Section 564(b)(1) of the Act, 21 U.S.C. section 360-bbb-3(b)(1), unless the authorization is terminated or revoked sooner.  Performed at [facility], [address] [place], [place] [address]          Radiology Studies: CT CHEST WO CONTRAST  Result Date: 01/22/2021 CLINICAL DATA:  Respiratory failure, COVID-19 positive, renal insufficiency, diabetes EXAM: CT CHEST WITHOUT CONTRAST TECHNIQUE: Multidetector CT imaging of the chest was performed following the standard protocol  without IV contrast. COMPARISON:  01/22/2021 FINDINGS: Cardiovascular: Unenhanced imaging of the heart and great vessels demonstrates no significant pericardial effusion. No evidence of thoracic aortic aneurysm. Evaluation of the aortic lumen is limited without IV contrast. Diffuse atherosclerosis of the aorta and coronary vasculature. Mediastinum/Nodes: No enlarged mediastinal or axillary lymph nodes. Thyroid gland, trachea, and esophagus demonstrate no significant findings. Lungs/Pleura: Upper lobe predominant emphysema. There are scattered areas of subpleural ground-glass airspace disease within the right upper and right middle lobes, compatible with early infection. There is circumferential left pleural thickening and calcification, consistent with prior surgery or trauma. Rounded consolidation within the left  lower lobe most compatible with rounded atelectasis. No effusion or pneumothorax. There is mucoid material within the right mainstem bronchus. Right lower lobe bronchial wall thickening is identified consistent with bronchitis or reactive airway disease. Upper Abdomen: No acute abnormality. Musculoskeletal: No acute or destructive bony lesions. IMPRESSION: 1. Upper lobe predominant emphysema, with patchy right upper and right middle lobe subpleural ground-glass airspace disease compatible with early infection or inflammation. 2. Right lower lobe bronchial wall thickening consistent with bronchitis or reactive airway disease. 3. Left pleural thickening and calcification consistent with previous trauma or surgery. Rounded left lower lobe consolidation most compatible with rounded atelectasis. 4. Aortic Atherosclerosis (ICD10-I70.0) and Emphysema (ICD10-J43.9). Electronically Signed   By: Sharlet Salina M.D.   On: 01/22/2021 16:58   US RENAL  Result Date: 01/22/2021 CLINICAL DATA:  Acute kidney injury EXAM: RENAL / URINARY TRACT ULTRASOUND COMPLETE COMPARISON:  None. FINDINGS: Right Kidney: Renal  measurements: 10.7 x 4.5 x 4.5 cm = volume: 112.6 mL. Diffusely increased renal cortical echogenicity. 9.6 mm shadowing calculus is seen upper pole. There is mild pelviectasis without calyceal dilatation. No frank hydronephrosis. No concerning renal mass. Left Kidney: Renal measurements: 10.5 x 5.9 x 5.3 cm = volume: 170 mL. Diffusely increased renal cortical echogenicity. 9.4 mm shadowing calculus in the interpolar left kidney. No significant urinary tract dilatation. No concerning renal mass. Bladder: Appears normal for degree of bladder distention. Bilateral bladder jets are identified. Other: None. IMPRESSION: 1. Diffusely increased renal cortical echogenicity compatible with medical renal disease. 2. Bilateral nonobstructing renal calculi. 3. Mild right pelviectasis without frank hydronephrosis. Presence of a visible right bladder jet argues against the presence of obstruction, may reflect extrarenal pelvis or benign incidental. Electronically Signed   By: Kreg Shropshire M.D.   On: 01/22/2021 17:24   DG Chest Port 1 View  Result Date: 01/22/2021 CLINICAL DATA:  Respiratory distress and altered mental status. EXAM: PORTABLE CHEST 1 VIEW COMPARISON:  None. FINDINGS: Atherosclerotic calcification of the aortic arch. Tortuous thoracic aorta. Heart size within normal limits for projection. Abnormal blunting of the left lateral costophrenic angle. Linear bandlike scarring peripherally in the left lung base. There is some hazy density at the left lung base, possibly from layering pleural fluid. Volume loss at the left lung base. The right lung appears clear. IMPRESSION: 1. Hazy density at the left lung base, potentially from layering pleural fluid. Blunted left lateral costophrenic angle. 2. Scarring peripherally in the left lung base with some mild left basilar volume loss. 3. Tortuous thoracic aorta.  Aortic Atherosclerosis (ICD10-I70.0). Electronically Signed   By: Gaylyn Rong M.D.   On: 01/22/2021 11:23         Scheduled Meds:  vitamin C  500 mg Oral BID   carvedilol  6.25 mg Oral BID WC   collagenase   Topical Daily   feeding supplement (NEPRO CARB STEADY)  237 mL Oral TID WC   heparin  5,000 Units Subcutaneous Q8H   insulin aspart  0-9 Units Subcutaneous TID WC   methylPREDNISolone (SOLU-MEDROL) injection  60 mg Intravenous Q12H   multivitamin with minerals  1 tablet Oral Daily   Continuous Infusions:  sodium chloride 75 mL/hr at 01/23/21 2119   remdesivir 100 mg in NS 100 mL 100 mg (01/23/21 1014)     LOS: 2 days    Time spent:  Carma Leaven DO  [facility] 01/24/2021, 8:06 AM

## 2021-01-25 DIAGNOSIS — Z7189 Other specified counseling: Secondary | ICD-10-CM | POA: Diagnosis not present

## 2021-01-25 DIAGNOSIS — Z515 Encounter for palliative care: Secondary | ICD-10-CM | POA: Diagnosis not present

## 2021-01-25 DIAGNOSIS — J9621 Acute and chronic respiratory failure with hypoxia: Secondary | ICD-10-CM | POA: Diagnosis not present

## 2021-01-25 LAB — GLUCOSE, CAPILLARY
Glucose-Capillary: 180 mg/dL — ABNORMAL HIGH (ref 70–99)
Glucose-Capillary: 237 mg/dL — ABNORMAL HIGH (ref 70–99)
Glucose-Capillary: 189 mg/dL — ABNORMAL HIGH (ref 70–99)
Glucose-Capillary: 143 mg/dL — ABNORMAL HIGH (ref 70–99)

## 2021-01-25 NOTE — Progress Notes (Addendum)
Daily Progress Note   Patient Name: Blake Villarreal.       Date: 01/25/2021 DOB: 1946/05/10  Age: 75 y.o. MRN#: [id number] Attending Physician: Azucena Fallen, MD Primary Care Physician: Eloisa Northern, MD Admit Date: 01/22/2021  Reason for Consultation/Follow-up: Establishing goals of care  Subjective: Patient on covid isolation. He states he feels better. He asks for updates.   We discussed his diagnoses, prognosis, GOC, EOL wishes disposition and options.  A detailed discussion was had today regarding advanced directives.  Concepts specific to code status, artifical feeding and hydration, IV antibiotics and rehospitalization were discussed.  The difference between an aggressive medical intervention path and a comfort care path was discussed.  Values and goals of care important to patient and family were attempted to be elicited.  Discussed limitations of medical interventions to prolong quality of life in some situations and discussed the concept of human mortality.  He states smoking has been the most important thing to him. He states living longer if possible is important to him. He states he does not eat a lot as he does not like the food because it has to be soft. He states his teeth which he keeps in a blue container were lost and he has not had them replaced. He states he has asked for Ensure but it is not provided. He states he will work on eating and drinking more. He states he has previously advised he would want intubation and then to be left on a ventilator for 7 days before being removed; he continues to desire this plan. He is unsure about CPR and wants to talk with [name]. He would like her to be his HPOA when that paperwork can be completed.   Updated [name]. [name] seems  frustrated that "he will not do what he is supposed to" meaning not following a medical plan or advice to prolong his life, but he will state he wants to do what he can to live as long as he can. She discusses a desire to honor his decisions whatever they may be, but would like for him to have actions that reflect the decisions he makes.    Length of Stay: 3  Current Medications: Scheduled Meds:  . vitamin C  500 mg Oral BID  . carvedilol  6.25 mg Oral  BID WC  . collagenase   Topical Daily  . feeding supplement (NEPRO CARB STEADY)  237 mL Oral TID WC  . heparin  5,000 Units Subcutaneous Q8H  . insulin aspart  0-9 Units Subcutaneous TID WC  . methylPREDNISolone (SOLU-MEDROL) injection  60 mg Intravenous Q12H  . multivitamin with minerals  1 tablet Oral Daily    Continuous Infusions: . sodium chloride 75 mL/hr at 01/25/21 0939    PRN Meds: acetaminophen **OR** acetaminophen, ondansetron **OR** ondansetron (ZOFRAN) IV      Vital Signs: BP 136/84 (BP Location: Left Arm)   Pulse 73   Temp 98.7 F (37.1 C) (Oral)   Resp 18   Ht 5\' 9"  (1.753 m)   Wt 54.2 kg   SpO2 100%   BMI 17.65 kg/m  SpO2: SpO2: 100 % O2 Device: O2 Device: Nasal Cannula O2 Flow Rate: O2 Flow Rate (L/min): 2 L/min  Intake/output summary:   Intake/Output Summary (Last 24 hours) at 01/25/2021 1110 Last data filed at 01/24/2021 2100 Gross per 24 hour  Intake --  Output 250 ml  Net -250 ml   LBM: Last BM Date: 01/24/21 Baseline Weight: Weight: 65.8 kg Most recent weight: Weight: 54.2 kg       Patient Active Problem List   Diagnosis Date Noted  . AKI (acute kidney injury) (HCC) 01/22/2021  . Acute on chronic respiratory failure with hypoxia (HCC) 01/22/2021  . Decubitus ulcer of sacral region, stage 4 (HCC) 01/22/2021  . Severe protein-energy malnutrition (HCC) 01/22/2021  . S/P AKA (above knee amputation) (HCC) 01/22/2021  . Hypoxia 01/22/2021    Palliative Care Assessment & Plan     Recommendations/Plan: Full code/full scope.  Patient wants family member 03/22/2021 to be his HPOA, not his children who are his NOK. These papers will need to be completed as soon as logistically possible. Primary team could try Remeron to help with appetite.     Code Status:    Code Status Orders  (From admission, onward)         Start     Ordered   01/22/21 2132  Full code  Continuous        01/22/21 2131        Code Status History    This patient has a current code status but no historical code status.   Advance Care Planning Activity    Advance Directive Documentation   Flowsheet Row Most Recent Value  Type of Advance Directive Living will  Pre-existing out of facility DNR order (yellow form or pink MOST form) --  "MOST" Form in Place? --      Prognosis: Poor overall   Thank you for allowing the Palliative Medicine Team to assist in the care of this patient.   Total Time 25 min Prolonged Time Billed no   COVID-19 DISASTER DECLARATION:    FULL CONTACT PHYSICAL EXAMINATION WAS NOT POSSIBLE DUE TO TREATMENT OF COVID-19  AND CONSERVATION OF PERSONAL PROTECTIVE EQUIPMENT   Patient assessed or the symptoms described in the history of present illness and daily progress note.  In the context of the Global COVID-19 pandemic, which necessitated consideration that the patient might be at risk for infection with the SARS-CoV-2 virus that causes COVID-19, Institutional protocols and algorithms that pertain to the evaluation of patients at risk for COVID-19 are in a state of rapid change based on information released by regulatory bodies including the CDC and federal and state organizations. These policies and algorithms were followed during the  patient's care while in hospital.    Greater than 50%  of this time was spent counseling and coordinating care related to the above assessment and plan.  Morton Stall, NP  Please contact Palliative Medicine Team phone at  [phone] for questions and concerns.

## 2021-01-25 NOTE — Progress Notes (Signed)
PROGRESS NOTE    Blake Villarreal.  OJJ:[id number] DOB: Apr 29, 1946 DOA: 01/22/2021 PCP: Eloisa Northern, MD  Brief Narrative:Blake W Joshawn Crissman. is a 75 y.o. male chronically ill nursing home resident with history of COPD, CAD severe peripheral vascular disease, right above-knee amputation, chronic sacral decubitus ulcers, chronic kidney disease stage unknown, chronic anemia, severe malnutrition, PTSD, chronic lymphedema was sent to ED 2/7 from [facility] due to respiratory distress patient reports being in his usual state of health yesterday,, he is bedbound and wheelchair-bound at baseline, reports feeling short short of breath x1 day, worsened over a few hours and became confused, EMS was called upon arrival his sats were noted to be around 40% on room air which quickly improved after a nonrebreather mask was placed, subsequently brought to the emergency room - noted to be covid positive with hypoxia. Hospitalist called to admit.   Assessment & Plan:   Acute Hypoxic resp failure, multifactorial, POA - Concurrent COPD exacerbation likely triggered by COVID-19 without clear indication for acute viral pneumonia - CT chest noted extensive emphysema, patchy groundglass airspace disease compatible with early infection and left lower lobe consolidation probable atelectasis - Stop remdesivir; continue solu-Medrol for shortened duration given symptoms/rapid improvement SpO2: 95 % O2 Flow Rate (L/min): 2 L/min  AKI (acute kidney injury) (HCC) Hyperkalemia - Creatinine baseline around 1.6 -1.9 - Downtrending appropriately Lab Results  Component Value Date   CREATININE 2.61 (H) 01/24/2021   CREATININE 3.29 (H) 01/23/2021   CREATININE 3.57 (H) 01/22/2021   Large extensive and deep sacral decubitus wounds - Sepsis ruled out - Multiple large wounds including right sacrum which is 9X6X 4 cm with slough and black eschar, left ischium also chronic stage IV 8x5x3cm, R ischium-6x3x4cm - All  wounds are stage IV, deep, 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 - Continue wound care, unable to get contrast imaging due to creatinine  Normocytic anemia - Baseline hemoglobin around 7-8 - Hemoglobin 7.2 today, anemia panel suggestive of chronic disease and iron deficiency - Suspect chronic blood loss from extensive sacral decubitus wounds - S/P IV iron  Severe protein calorie malnutrition, chronic - Albumin is 1.5 - Add supplements as tolerated - Family indicates he is a very poor eater at baseline, poor prognosis if his caloric intake continues to be profoundly poor  Severe peripheral vascular disease - Status post right AKA  COPD, history of tobacco abuse - No wheezing at this time, nebs PRN  History of CAD - No symptoms of ACS, continue aspirin, coreg, statin  DVT prophylaxis: Heparin subcutaneous Code Status: Full Code Family Communication: Discussed with [name], cousin in detail Disposition Plan: Back to SNF when stable and improved  Status is: Inpatient  Remains inpatient appropriate because:Inpatient level of care appropriate due to severity of illness   Dispo: The patient is from: SNF              Anticipated d/c is to: SNF              Anticipated d/c date is: > 3 days              Patient currently is not medically stable to d/c.   Difficult to place patient No  Consultants:   Palliative medicine   Procedures:   Antimicrobials:    Subjective: No acute issues or events overnight, pleasantly resting in bed attempting to eat breakfast but denies any nausea vomiting diarrhea constipation headache fevers chest pain or shortness of breath.  Objective: Vitals:  01/25/21 0300 01/25/21 0504 01/25/21 0855 01/25/21 1315  BP:  (!) 114/47 136/84 133/67  Pulse:  73 73 94  Resp:  18 18   Temp:  98.1 F (36.7 C) 98.7 F (37.1 C) 99 F (37.2 C)  TempSrc:   Oral Oral  SpO2:  100% 100% 95%  Weight: 54.2 kg     Height:        Intake/Output Summary (Last  24 hours) at 01/25/2021 1533 Last data filed at 01/24/2021 2100 Gross per 24 hour  Intake --  Output 250 ml  Net -250 ml   Filed Weights   01/23/21 0330 01/24/21 2150 01/25/21 0300  Weight: 52.3 kg 53.5 kg 54.2 kg    Examination:  General exam: Chronically ill, sitting up in bed, awake alert oriented to self place only, no distress CVS: S1-S2, regular rate rhythm Lungs: Decreased breath sounds the bases, few scattered rhonchi Abdomen: Soft, nontender, bowel sounds present Extremities: Right AKA, left leg with chronic skin changes, hyperpigmentation and scaling Skin: Multiple large and deep sacral decubitus wounds with eschar Neuro: Moves both upper extremities and left leg, no focal deficits  Data Reviewed:   CBC: Recent Labs  Lab 01/22/21 1035 01/22/21 2232 01/23/21 0649 01/24/21 0613  WBC 17.6* 17.9* 16.6* 12.5*  NEUTROABS 12.8*  --   --   --   HGB 7.1* 7.2* 7.0* 7.2*  HCT 24.1* 23.3* 23.1* 22.9*  MCV 84.9 84.1 84.0 80.9  PLT 641* 595* 576* 601*   Basic Metabolic Panel: Recent Labs  Lab 01/22/21 1035 01/22/21 2232 01/23/21 0649 01/24/21 0613  NA 136  --  137 137  K 5.4*  --  4.8 4.4  CL 102  --  106 107  CO2 21*  --  16* 19*  GLUCOSE 126*  --  206* 223*  BUN 60*  --  55* 50*  CREATININE 4.16* 3.57* 3.29* 2.61*  CALCIUM 8.6*  --  8.0* 8.2*   GFR: Estimated Creatinine Clearance: 18.7 mL/min (A) (by C-G formula based on SCr of 2.61 mg/dL (H)). Liver Function Tests: Recent Labs  Lab 01/22/21 1035 01/23/21 0649 01/24/21 0613  AST 44* 37 31  ALT 21 18 21   ALKPHOS 91 81 77  BILITOT 0.5 0.5 0.4  PROT 7.5 6.2* 6.4*  ALBUMIN 1.7* 1.5* 1.5*   No results for input(s): LIPASE, AMYLASE in the last 168 hours. Recent Labs  Lab 01/22/21 1035  AMMONIA 20   Coagulation Profile: Recent Labs  Lab 01/22/21 1035  INR 1.2   Cardiac Enzymes: No results for input(s): CKTOTAL, CKMB, CKMBINDEX, TROPONINI in the last 168 hours. BNP (last 3 results) No results for  input(s): PROBNP in the last 8760 hours. HbA1C: Recent Labs    01/23/21 0649  HGBA1C 6.2*   CBG: Recent Labs  Lab 01/24/21 1224 01/24/21 1620 01/24/21 2129 01/25/21 0852 01/25/21 1313  GLUCAP 161* 294* 151* 180* 237*   Lipid Profile: No results for input(s): CHOL, HDL, LDLCALC, TRIG, CHOLHDL, LDLDIRECT in the last 72 hours. Thyroid Function Tests: No results for input(s): TSH, T4TOTAL, FREET4, T3FREE, THYROIDAB in the last 72 hours. Anemia Panel: Recent Labs    01/22/21 1923  VITAMINB12 948*  FOLATE 41.0  FERRITIN 440*  TIBC 102*  IRON 10*  RETICCTPCT 1.2   Urine analysis: No results found for: COLORURINE, APPEARANCEUR, LABSPEC, PHURINE, GLUCOSEU, HGBUR, BILIRUBINUR, KETONESUR, PROTEINUR, UROBILINOGEN, NITRITE, LEUKOCYTESUR Sepsis Labs: @LABRCNTIP (procalcitonin:4,lacticidven:4)  ) Recent Results (from the past 240 hour(s))  Blood culture (routine single)  Status: None (Preliminary result)   Collection Time: 01/22/21 10:35 AM   Specimen: BLOOD  Result Value Ref Range Status   Specimen Description BLOOD BLOOD RIGHT HAND  Final   Special Requests   Final    BOTTLES DRAWN AEROBIC AND ANAEROBIC Blood Culture results may not be optimal due to an inadequate volume of blood received in culture bottles   Culture   Final    NO GROWTH 3 DAYS Performed at [facility], [address], [place], [place] [address]    Report Status PENDING  Incomplete  SARS Coronavirus 2 by RT PCR (hospital order, performed in [facility] hospital lab) Nasopharyngeal Nasopharyngeal Swab     Status: Abnormal   Collection Time: 01/22/21 10:35 AM   Specimen: Nasopharyngeal Swab  Result Value Ref Range Status   SARS Coronavirus 2 POSITIVE (A) NEGATIVE Final    Comment: RESULT CALLED TO, READ BACK BY AND VERIFIED WITH: REED RENO RN AT 1133 ON 01/22/21 SNG (NOTE) SARS-CoV-2 target nucleic acids are DETECTED  SARS-CoV-2 RNA is generally detectable in upper respiratory specimens   during the acute phase of infection.  Positive results are indicative  of the presence of the identified virus, but do not rule out bacterial infection or co-infection with other pathogens not detected by the test.  Clinical correlation with patient history and  other diagnostic information is necessary to determine patient infection status.  The expected result is negative.  Fact Sheet for Patients:   BoilerBrush.com.cy   Fact Sheet for Healthcare Providers:   https://pope.com/    This test is not yet approved or cleared by the [place] FDA and  has been authorized for detection and/or diagnosis of SARS-CoV-2 by FDA under an Emergency Use Authorization (EUA).  This EUA will remain in effect (meaning this te st can be used) for the duration of  the COVID-19 declaration under Section 564(b)(1) of the Act, 21 U.S.C. section 360-bbb-3(b)(1), unless the authorization is terminated or revoked sooner.  Performed at [facility], [address] [place], [place] [address]          Radiology Studies: No results found.      Scheduled Meds:  vitamin C  500 mg Oral BID   carvedilol  6.25 mg Oral BID WC   collagenase   Topical Daily   feeding supplement (NEPRO CARB STEADY)  237 mL Oral TID WC   heparin  5,000 Units Subcutaneous Q8H   insulin aspart  0-9 Units Subcutaneous TID WC   methylPREDNISolone (SOLU-MEDROL) injection  60 mg Intravenous Q12H   multivitamin with minerals  1 tablet Oral Daily   Continuous Infusions:  sodium chloride 75 mL/hr at 01/25/21 0939     LOS: 3 days    Time spent:  Carma Leaven DO  [facility] 01/25/2021, 3:33 PM

## 2021-01-26 DIAGNOSIS — J9621 Acute and chronic respiratory failure with hypoxia: Secondary | ICD-10-CM | POA: Diagnosis not present

## 2021-01-26 LAB — COMPREHENSIVE METABOLIC PANEL
ALT: 52 U/L — ABNORMAL HIGH (ref 0–44)
AST: 73 U/L — ABNORMAL HIGH (ref 15–41)
Albumin: 1.4 g/dL — ABNORMAL LOW (ref 3.5–5.0)
Alkaline Phosphatase: 74 U/L (ref 38–126)
Anion gap: 9 (ref 5–15)
BUN: 42 mg/dL — ABNORMAL HIGH (ref 8–23)
CO2: 18 mmol/L — ABNORMAL LOW (ref 22–32)
Calcium: 7.8 mg/dL — ABNORMAL LOW (ref 8.9–10.3)
Chloride: 109 mmol/L (ref 98–111)
Creatinine, Ser: 1.35 mg/dL — ABNORMAL HIGH (ref 0.61–1.24)
GFR, Estimated: 55 mL/min — ABNORMAL LOW (ref >60)
Glucose, Bld: 160 mg/dL — ABNORMAL HIGH (ref 70–99)
Potassium: 4.2 mmol/L (ref 3.5–5.1)
Sodium: 136 mmol/L (ref 135–145)
Total Bilirubin: 0.3 mg/dL (ref 0.3–1.2)
Total Protein: 5.6 g/dL — ABNORMAL LOW (ref 6.5–8.1)

## 2021-01-26 LAB — CBC
HCT: 20.1 % — ABNORMAL LOW (ref 39.0–52.0)
Hemoglobin: 6.3 g/dL — ABNORMAL LOW (ref 13.0–17.0)
MCH: 25.7 pg — ABNORMAL LOW (ref 26.0–34.0)
MCHC: 31.3 g/dL (ref 30.0–36.0)
MCV: 82 fL (ref 80.0–100.0)
Platelets: 554 10*3/uL — ABNORMAL HIGH (ref 150–400)
RBC: 2.45 MIL/uL — ABNORMAL LOW (ref 4.22–5.81)
RDW: 19.5 % — ABNORMAL HIGH (ref 11.5–15.5)
WBC: 9.9 10*3/uL (ref 4.0–10.5)
nRBC: 0 % (ref 0.0–0.2)

## 2021-01-26 LAB — GLUCOSE, CAPILLARY
Glucose-Capillary: 145 mg/dL — ABNORMAL HIGH (ref 70–99)
Glucose-Capillary: 238 mg/dL — ABNORMAL HIGH (ref 70–99)
Glucose-Capillary: 181 mg/dL — ABNORMAL HIGH (ref 70–99)
Glucose-Capillary: 156 mg/dL — ABNORMAL HIGH (ref 70–99)

## 2021-01-26 LAB — ABO/RH: ABO/RH(D): O POS

## 2021-01-26 LAB — PREPARE RBC (CROSSMATCH)

## 2021-01-26 MED ORDER — FUROSEMIDE 10 MG/ML IJ SOLN
20.0000 mg | Freq: Once | INTRAMUSCULAR | Status: AC
  Administered 2021-01-26: 20 mg via INTRAVENOUS
  Filled 2021-01-26: qty 2

## 2021-01-26 MED ORDER — SODIUM CHLORIDE 0.9% IV SOLUTION: Freq: Once | INTRAVENOUS | Status: AC

## 2021-01-26 MED ORDER — METHYLPREDNISOLONE SODIUM SUCC 125 MG IJ SOLR
60.0000 mg | INTRAMUSCULAR | Status: DC
  Administered 2021-01-26 – 2021-01-28 (×3): 60 mg via INTRAVENOUS
  Filled 2021-01-26 (×3): qty 2

## 2021-01-26 MED ORDER — MORPHINE SULFATE (PF) 2 MG/ML IV SOLN
1.0000 mg | INTRAVENOUS | Status: DC | PRN
  Administered 2021-01-26 – 2021-01-28 (×4): 1 mg via INTRAVENOUS
  Filled 2021-01-26 (×4): qty 1

## 2021-01-26 NOTE — Progress Notes (Signed)
PT Cancellation Note  Patient Details Name: Blake Villarreal. MRN: [id number] DOB: Apr 15, 1946   Cancelled Treatment:    Reason Eval/Treat Not Completed: Patient not medically ready (Chart reviewed, Hb with continued slight down trend, potentially due to volume changes, nonetheless below what is typically considered safe for exertion required for physical therapy assessment.) Will conitnue to follow remotely, commence evaluation when appropriate. Thank you kindly for considering our services in the care of this patient.    Tomasita Beevers C 01/26/2021, 1:58 PM

## 2021-01-26 NOTE — Progress Notes (Signed)
OT Cancellation Note  Patient Details Name: Blake Villarreal. MRN: [id number] DOB: 16-Sep-1946   Cancelled Treatment:    Reason Eval/Treat Not Completed: Medical issues which prohibited therapy. Consult received, chart reviewed. Pt noted with Hgb 6.3, order for blood transfusion. Will hold OT and re-attempt at later date/time as medically appropriate.  Wynona Canes, MPH, MS, OTR/L ascom [phone] 01/26/21, 3:46 PM

## 2021-01-26 NOTE — Progress Notes (Signed)
Pt transferred from 2A (room 246) to room 103.

## 2021-01-26 NOTE — Progress Notes (Signed)
PROGRESS NOTE    Blake Villarreal.  HYW:[id number] DOB: Jan 30, 1946 DOA: 01/22/2021 PCP: Eloisa Northern, MD  Brief Narrative:Blake W Gilberto Stanforth. is a 75 y.o. male chronically ill nursing home resident with history of COPD, CAD severe peripheral vascular disease, right above-knee amputation, chronic sacral decubitus ulcers, chronic kidney disease stage unknown, chronic anemia, severe malnutrition, PTSD, chronic lymphedema was sent to ED 2/7 from [facility] due to respiratory distress patient reports being in his usual state of health yesterday,, he is bedbound and wheelchair-bound at baseline, reports feeling short short of breath x1 day, worsened over a few hours and became confused, EMS was called upon arrival his sats were noted to be around 40% on room air which quickly improved after a nonrebreather mask was placed, subsequently brought to the emergency room - noted to be covid positive with hypoxia. Hospitalist called to admit.  Assessment & Plan:   Acute Hypoxic resp failure, multifactorial, POA, resolving - Concurrent COPD exacerbation likely triggered by COVID-19 without clear indication for acute viral pneumonia - CT chest noted extensive emphysema, patchy groundglass airspace disease compatible with early infection and left lower lobe consolidation probable atelectasis - Stop remdesivir; continue solu-Medrol for shortened duration given minimal symptoms/rapid improvement -Patient remains on 2 L nasal cannula despite being 100% over the past 12 hours, will discontinue oxygen, follow ambulatory oxygen requirements  AKI (acute kidney injury) (HCC) Hyperkalemia - Creatinine baseline around 1.6 -1.9 - Downtrending appropriately Lab Results  Component Value Date   CREATININE 1.35 (H) 01/26/2021   CREATININE 2.61 (H) 01/24/2021   CREATININE 3.29 (H) 01/23/2021   Large extensive and deep sacral decubitus wounds - Sepsis ruled out - Multiple large wounds including right sacrum  which is 9X6X 4 cm with slough and black eschar, left ischium also chronic stage IV 8x5x3cm, R ischium-6x3x4cm - All wounds are stage IV, deep, 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 - Continue wound care, unable to get contrast imaging due to creatinine  Normocytic anemia, continues to worsen - Baseline hemoglobin around 7-8 - Hemoglobin 6.3 today, anemia panel suggestive of chronic disease and iron deficiency - Transfuse 1u PRBC - follow am labs - Suspect chronic blood loss from extensive sacral decubitus wounds - S/P IV iron  Severe protein calorie malnutrition, chronic - Albumin is 1.5 - Add supplements as tolerated - Family indicates he is a very poor eater at baseline, poor prognosis if his caloric intake continues to be profoundly poor  Severe peripheral vascular disease - Status post right AKA  COPD, history of tobacco abuse - No wheezing at this time, nebs PRN  History of CAD - No symptoms of ACS, continue aspirin, coreg, statin  DVT prophylaxis: SCDs only given ongoing anemia as above Code Status: Full Code Family Communication: Previously discussed with [name] (cousin) in detail Disposition Plan: Back to SNF when stable and improved  Status is: Inpatient  Remains inpatient appropriate because:Inpatient level of care appropriate due to severity of illness   Dispo: The patient is from: SNF              Anticipated d/c is to: SNF              Anticipated d/c date is: 2 days              Patient currently is not medically stable to d/c.   Difficult to place patient No  Consultants:   Palliative medicine  Subjective: No acute issues or events overnight, pleasantly resting in bed attempting  to eat breakfast but denies any nausea vomiting diarrhea constipation headache fevers chest pain or shortness of breath.  Objective: Vitals:   01/26/21 0600 01/26/21 0700 01/26/21 0850 01/26/21 1152  BP:   (!) 160/63 (!) 158/65  Pulse:   66 68  Resp: 20 17    Temp:   98  F (36.7 C) 98.1 F (36.7 C)  TempSrc:   Oral Oral  SpO2:   99% 100%  Weight:      Height:        Intake/Output Summary (Last 24 hours) at 01/26/2021 1527 Last data filed at 01/26/2021 1152 Gross per 24 hour  Intake --  Output 150 ml  Net -150 ml   Filed Weights   01/24/21 2150 01/25/21 0300 01/26/21 0343  Weight: 53.5 kg 54.2 kg 54.3 kg    Examination:  General exam: Chronically ill, sitting up in bed, awake alert oriented to self place only, no distress CVS: S1-S2, regular rate rhythm Lungs: Decreased breath sounds the bases, few scattered rhonchi Abdomen: Soft, nontender, bowel sounds present Extremities: Right AKA, left leg with chronic skin changes, hyperpigmentation and scaling Skin: Multiple large and deep sacral decubitus wounds with eschar Neuro: Moves both upper extremities and left leg, no focal deficits  Data Reviewed:   CBC: Recent Labs  Lab 01/22/21 1035 01/22/21 2232 01/23/21 0649 01/24/21 0613 01/26/21 0513  WBC 17.6* 17.9* 16.6* 12.5* 9.9  NEUTROABS 12.8*  --   --   --   --   HGB 7.1* 7.2* 7.0* 7.2* 6.3*  HCT 24.1* 23.3* 23.1* 22.9* 20.1*  MCV 84.9 84.1 84.0 80.9 82.0  PLT 641* 595* 576* 601* 554*   Basic Metabolic Panel: Recent Labs  Lab 01/22/21 1035 01/22/21 2232 01/23/21 0649 01/24/21 0613 01/26/21 0513  NA 136  --  137 137 136  K 5.4*  --  4.8 4.4 4.2  CL 102  --  106 107 109  CO2 21*  --  16* 19* 18*  GLUCOSE 126*  --  206* 223* 160*  BUN 60*  --  55* 50* 42*  CREATININE 4.16* 3.57* 3.29* 2.61* 1.35*  CALCIUM 8.6*  --  8.0* 8.2* 7.8*   GFR: Estimated Creatinine Clearance: 36.3 mL/min (A) (by C-G formula based on SCr of 1.35 mg/dL (H)). Liver Function Tests: Recent Labs  Lab 01/22/21 1035 01/23/21 0649 01/24/21 0613 01/26/21 0513  AST 44* 37 31 73*  ALT 21 18 21  52*  ALKPHOS 91 81 77 74  BILITOT 0.5 0.5 0.4 0.3  PROT 7.5 6.2* 6.4* 5.6*  ALBUMIN 1.7* 1.5* 1.5* 1.4*   No results for input(s): LIPASE, AMYLASE in the  last 168 hours. Recent Labs  Lab 01/22/21 1035  AMMONIA 20   Coagulation Profile: Recent Labs  Lab 01/22/21 1035  INR 1.2   Cardiac Enzymes: No results for input(s): CKTOTAL, CKMB, CKMBINDEX, TROPONINI in the last 168 hours. BNP (last 3 results) No results for input(s): PROBNP in the last 8760 hours. HbA1C: No results for input(s): HGBA1C in the last 72 hours. CBG: Recent Labs  Lab 01/25/21 1313 01/25/21 1724 01/25/21 2000 01/26/21 0843 01/26/21 1149  GLUCAP 237* 189* 143* 145* 238*   Lipid Profile: No results for input(s): CHOL, HDL, LDLCALC, TRIG, CHOLHDL, LDLDIRECT in the last 72 hours. Thyroid Function Tests: No results for input(s): TSH, T4TOTAL, FREET4, T3FREE, THYROIDAB in the last 72 hours. Anemia Panel: No results for input(s): VITAMINB12, FOLATE, FERRITIN, TIBC, IRON, RETICCTPCT in the last 72 hours. Urine analysis: No  results found for: COLORURINE, APPEARANCEUR, LABSPEC, PHURINE, GLUCOSEU, HGBUR, BILIRUBINUR, KETONESUR, PROTEINUR, UROBILINOGEN, NITRITE, LEUKOCYTESUR Sepsis Labs: @LABRCNTIP (procalcitonin:4,lacticidven:4)  ) Recent Results (from the past 240 hour(s))  Blood culture (routine single)     Status: None (Preliminary result)   Collection Time: 01/22/21 10:35 AM   Specimen: BLOOD  Result Value Ref Range Status   Specimen Description BLOOD BLOOD RIGHT HAND  Final   Special Requests   Final    BOTTLES DRAWN AEROBIC AND ANAEROBIC Blood Culture results may not be optimal due to an inadequate volume of blood received in culture bottles   Culture   Final    NO GROWTH 4 DAYS Performed at [facility], [address] [place], [place]    Report Status PENDING  Incomplete  SARS Coronavirus 2 by RT PCR (hospital order, performed in [facility] hospital lab) Nasopharyngeal Nasopharyngeal Swab     Status: Abnormal   Collection Time: 01/22/21 10:35 AM   Specimen: Nasopharyngeal Swab  Result Value Ref Range Status   SARS Coronavirus 2  POSITIVE (A) NEGATIVE Final    Comment: RESULT CALLED TO, READ BACK BY AND VERIFIED WITH: REED RENO RN AT 1133 ON 01/22/21 SNG (NOTE) SARS-CoV-2 target nucleic acids are DETECTED  SARS-CoV-2 RNA is generally detectable in upper respiratory specimens  during the acute phase of infection.  Positive results are indicative  of the presence of the identified virus, but do not rule out bacterial infection or co-infection with other pathogens not detected by the test.  Clinical correlation with patient history and  other diagnostic information is necessary to determine patient infection status.  The expected result is negative.  Fact Sheet for Patients:   03/22/21   Fact Sheet for Healthcare Providers:   BoilerBrush.com.cy    This test is not yet approved or cleared by the https://pope.com/ FDA and  has been authorized for detection and/or diagnosis of SARS-CoV-2 by FDA under an Emergency Use Authorization (EUA).  This EUA will remain in effect (meaning this te st can be used) for the duration of  the COVID-19 declaration under Section 564(b)(1) of the Act, 21 U.S.C. section 360-bbb-3(b)(1), unless the authorization is terminated or revoked sooner.  Performed at [facility], [address], [place], [place]     Radiology Studies: No results found.  Scheduled Meds: . sodium chloride   Intravenous Once  . vitamin C  500 mg Oral BID  . carvedilol  6.25 mg Oral BID WC  . collagenase   Topical Daily  . feeding supplement (NEPRO CARB STEADY)  237 mL Oral TID WC  . furosemide  20 mg Intravenous Once  . heparin  5,000 Units Subcutaneous Q8H  . insulin aspart  0-9 Units Subcutaneous TID WC  . methylPREDNISolone (SOLU-MEDROL) injection  60 mg Intravenous Q24H  . multivitamin with minerals  1 tablet Oral Daily   Continuous Infusions: . sodium chloride 75 mL/hr at 01/25/21 2221     LOS: 4 days    Time spent:  2222  DO  [facility] 01/26/2021, 3:27 PM

## 2021-01-26 NOTE — Plan of Care (Signed)
  Problem: Education: Goal: Knowledge of General Education information will improve Description: Including pain rating scale, medication(s)/side effects and non-pharmacologic comfort measures Outcome: Progressing   Problem: Clinical Measurements: Goal: Cardiovascular complication will be avoided Outcome: Progressing   Problem: Activity: Goal: Risk for activity intolerance will decrease Outcome: Progressing   Problem: Coping: Goal: Level of anxiety will decrease Outcome: Progressing   Problem: Elimination: Goal: Will not experience complications related to bowel motility Outcome: Progressing Goal: Will not experience complications related to urinary retention Outcome: Progressing   Problem: Safety: Goal: Ability to remain free from injury will improve Outcome: Progressing

## 2021-01-26 NOTE — Progress Notes (Signed)
Patient has  just Tylenol, unrelieved his pain. He's calling of pain medicine for a pain level of 10/10 on a pain scale. Paged B. Jon Billings NP for another pain med. See MAR for  Morphine order. Notified B. Jon Billings NP of 9 beats of SVT. No new order received for that. Patient is asymptomatic. Will continue to monitor.

## 2021-01-27 DIAGNOSIS — E43 Unspecified severe protein-calorie malnutrition: Secondary | ICD-10-CM

## 2021-01-27 DIAGNOSIS — N179 Acute kidney failure, unspecified: Secondary | ICD-10-CM

## 2021-01-27 DIAGNOSIS — J9621 Acute and chronic respiratory failure with hypoxia: Secondary | ICD-10-CM | POA: Diagnosis not present

## 2021-01-27 DIAGNOSIS — R0902 Hypoxemia: Secondary | ICD-10-CM

## 2021-01-27 DIAGNOSIS — L89154 Pressure ulcer of sacral region, stage 4: Secondary | ICD-10-CM | POA: Diagnosis not present

## 2021-01-27 LAB — COMPREHENSIVE METABOLIC PANEL
ALT: 45 U/L — ABNORMAL HIGH (ref 0–44)
AST: 42 U/L — ABNORMAL HIGH (ref 15–41)
Albumin: 1.4 g/dL — ABNORMAL LOW (ref 3.5–5.0)
Alkaline Phosphatase: 71 U/L (ref 38–126)
Anion gap: 9 (ref 5–15)
BUN: 33 mg/dL — ABNORMAL HIGH (ref 8–23)
CO2: 19 mmol/L — ABNORMAL LOW (ref 22–32)
Calcium: 7.9 mg/dL — ABNORMAL LOW (ref 8.9–10.3)
Chloride: 109 mmol/L (ref 98–111)
Creatinine, Ser: 1.13 mg/dL (ref 0.61–1.24)
GFR, Estimated: >60 mL/min (ref >60)
Glucose, Bld: 124 mg/dL — ABNORMAL HIGH (ref 70–99)
Potassium: 3.5 mmol/L (ref 3.5–5.1)
Sodium: 137 mmol/L (ref 135–145)
Total Bilirubin: 0.2 mg/dL — ABNORMAL LOW (ref 0.3–1.2)
Total Protein: 5.4 g/dL — ABNORMAL LOW (ref 6.5–8.1)

## 2021-01-27 LAB — GLUCOSE, CAPILLARY
Glucose-Capillary: 190 mg/dL — ABNORMAL HIGH (ref 70–99)
Glucose-Capillary: 212 mg/dL — ABNORMAL HIGH (ref 70–99)
Glucose-Capillary: 261 mg/dL — ABNORMAL HIGH (ref 70–99)
Glucose-Capillary: 210 mg/dL — ABNORMAL HIGH (ref 70–99)

## 2021-01-27 LAB — TYPE AND SCREEN
ABO/RH(D): O POS
Antibody Screen: NEGATIVE
Unit division: 0

## 2021-01-27 LAB — CBC
HCT: 23.5 % — ABNORMAL LOW (ref 39.0–52.0)
Hemoglobin: 7.7 g/dL — ABNORMAL LOW (ref 13.0–17.0)
MCH: 26.6 pg (ref 26.0–34.0)
MCHC: 32.8 g/dL (ref 30.0–36.0)
MCV: 81 fL (ref 80.0–100.0)
Platelets: 537 10*3/uL — ABNORMAL HIGH (ref 150–400)
RBC: 2.9 MIL/uL — ABNORMAL LOW (ref 4.22–5.81)
RDW: 18.7 % — ABNORMAL HIGH (ref 11.5–15.5)
WBC: 12.8 10*3/uL — ABNORMAL HIGH (ref 4.0–10.5)
nRBC: 0 % (ref 0.0–0.2)

## 2021-01-27 LAB — BPAM RBC
Blood Product Expiration Date: 202203162359
ISSUE DATE / TIME: 202202111540
Unit Type and Rh: 5100

## 2021-01-27 LAB — MRSA PCR SCREENING: MRSA by PCR: NEGATIVE

## 2021-01-27 LAB — CULTURE, BLOOD (SINGLE): Culture: NO GROWTH

## 2021-01-27 MED ORDER — FERROUS SULFATE 75 (15 FE) MG/ML PO SOLN
15.0000 mg | Freq: Two times a day (BID) | ORAL | Status: DC
  Filled 2021-01-27 (×2): qty 1

## 2021-01-27 MED ORDER — FERROUS SULFATE 75 (15 FE) MG/ML PO SOLN
65.0000 mg | Freq: Three times a day (TID) | ORAL | Status: DC
  Administered 2021-01-27 – 2021-01-28 (×3): 65 mg via ORAL
  Filled 2021-01-27: qty 4.3
  Filled 2021-01-27: qty 4.33
  Filled 2021-01-27: qty 4.3
  Filled 2021-01-27 (×2): qty 4.33
  Filled 2021-01-27: qty 4.3

## 2021-01-27 NOTE — Evaluation (Signed)
Physical Therapy Evaluation Patient Details Name: Blake Villarreal. MRN: [id number] DOB: [birth year] Today's Date: 01/27/2021   History of Present Illness  presented to ER secondary to respiratory distress; admitted for management of acute hypoxic respiratory failure related to COPD exacerbation, COVID-19.  Clinical Impression  Patient resting in bed upon arrival to room; pillows propped to prevent pressure relief to sacral area.  Denies pain at rest, but does demonstrate facial grimacing with movement (suggestive of FACES 6/10). Patient alert and oriented to self and general location; however, question full insight into situation, general deficits and overall safety/care needs.  Speech intermittently garbled and difficult to understand at times.  R AKA noted; well-healed (denies prosthetic use).  L knee lacking approx 10-15 degrees of knee extension; otherwise ROM grossly WFL for basic transfers and gait.  Significant dry patches, flaking skin to L foot, but no areas of formal breakdown noted.  Patient does have (and insists on wearing) multipodus boot from facility; insists on wearing with all WBing/transfer attempts.  Currently requiring mod assist for bed mobility; mod progressing to close sup for static sitting balance; max assist for squat pivot between seating surfaces.  Extensive lift assist required to prevent shearing of buttocks with transfer, as patient lacks strength to fully unweight self from seating surfaces.  Standing/gait not assessed, as patient non-ambulatory at baseline. Would benefit from skilled PT to address above deficits and promote optimal return to PLOF.;recommend transition to STR upon discharge from acute hospitalization, pending confirmation of baseline status with facility/family.  Did attempt to contact x2 this date; unable to reach for verification.     Follow Up Recommendations  (SNF vs LTC (pending confirmation of baseline status with facility/family; unable to reach  this date))    Equipment Recommendations       Recommendations for Other Services       Precautions / Restrictions Precautions Precautions: Fall Precaution Comments: stage IV sacral ulcer Restrictions Weight Bearing Restrictions: No      Mobility  Bed Mobility Overal bed mobility: Needs Assistance Bed Mobility: Supine to Sit     Supine to sit: Mod assist     General bed mobility comments: heavy use of elevated HOB, bedrails to assist; min cuing to sequence and initiate task; mod assist for truncal elevation    Transfers Overall transfer level: Needs assistance   Transfers: Squat Pivot Transfers     Squat pivot transfers: Max assist     General transfer comment: patient does appear familiar with transfer technique; able to assist therapist with set up, positioning, hand/foot placement; max assist for lift off and lateral movement between surface to prevent shearing forces through buttocks/sacrum  Ambulation/Gait             General Gait Details: non-ambulatory at baseline  Stairs            Wheelchair Mobility    Modified Rankin (Stroke Patients Only)       Balance Overall balance assessment: Needs assistance Sitting-balance support: No upper extremity supported;Feet supported Sitting balance-Leahy Scale: Fair Sitting balance - Comments: initial mod assist to maintain balance, progressing to close sup with accommodation to position                                     Pertinent Vitals/Pain Pain Assessment: Faces Faces Pain Scale: Hurts even more Pain Location: sacrum/buttocks Pain Descriptors / Indicators: Grimacing Pain Intervention(s): Limited activity within  patient's tolerance;Monitored during session;Repositioned    Home Living Family/patient expects to be discharged to:: Skilled nursing facility                 Additional Comments: Patient LTC resident at [facility]    Prior Function Level of Independence: Needs  assistance         Comments: Patient utilizes power WC as primary mobility; endorses transferring to/from "sometimes by myself, sometimes with help" (denies current use of mechanical lift)     Hand Dominance   Dominant Hand: Right    Extremity/Trunk Assessment   Upper Extremity Assessment Upper Extremity Assessment: Overall WFL for tasks assessed    Lower Extremity Assessment Lower Extremity Assessment: Generalized weakness (L LE grossly 3+ to 4-/5, lacking approx 10-15 degrees knee extension; R hip grossly 3+ to 4-/5, AKA noted)       Communication   Communication:  (speech intermittently slurred and difficult to understand at times)  Cognition Arousal/Alertness: Awake/alert Behavior During Therapy: WFL for tasks assessed/performed Overall Cognitive Status: No family/caregiver present to determine baseline cognitive functioning                                 General Comments: question recall/accuracy of information at times (related to PLOF), question insight into deficits and safety needs (pressure relief, transfer assist)      General Comments      Exercises Other Exercises Other Exercises: Patient insistent that multipodus boot (from facility) be worn on L foot at all times; requires blocking with transfer to prevent sliding. Other Exercises: Educated in weight shifting and position change for pressure relief; patient voiced understanding, question full comprehension and integration   Assessment/Plan    PT Assessment Patient needs continued PT services  PT Problem List Decreased strength;Decreased activity tolerance;Decreased balance;Decreased mobility;Decreased coordination;Decreased cognition;Decreased knowledge of use of DME;Decreased safety awareness;Decreased skin integrity;Pain;Decreased knowledge of precautions       PT Treatment Interventions DME instruction;Therapeutic activities;Functional mobility training;Patient/family education;Balance  training;Cognitive remediation;Therapeutic exercise    PT Goals (Current goals can be found in the Care Plan section)  Acute Rehab PT Goals Patient Stated Goal: to get out of the bed PT Goal Formulation: With patient Time For Goal Achievement: 02/10/21 Potential to Achieve Goals: Fair    Frequency Min 2X/week   Barriers to discharge        Co-evaluation               AM-PAC PT "6 Clicks" Mobility  Outcome Measure Help needed turning from your back to your side while in a flat bed without using bedrails?: A Little Help needed moving from lying on your back to sitting on the side of a flat bed without using bedrails?: A Lot Help needed moving to and from a bed to a chair (including a wheelchair)?: A Lot Help needed standing up from a chair using your arms (e.g., wheelchair or bedside chair)?: Total Help needed to walk in hospital room?: Total Help needed climbing 3-5 steps with a railing? : Total 6 Click Score: 10    End of Session   Activity Tolerance: Patient tolerated treatment well Patient left: in chair;with call bell/phone within reach Nurse Communication: Mobility status PT Visit Diagnosis: Muscle weakness (generalized) (M62.81);Difficulty in walking, not elsewhere classified (R26.2);Pain    Time: 6503-5465 PT Time Calculation (min) (ACUTE ONLY): 35 min   Charges:   PT Evaluation $PT Eval Moderate Complexity: 1 Mod PT  Treatments $Therapeutic Activity: 8-22 mins        Jcion Buddenhagen H. Manson Passey, PT, DPT, NCS 01/27/21, 3:11 PM [phone]

## 2021-01-27 NOTE — Progress Notes (Signed)
OT Cancellation Note  Patient Details Name: Blake Villarreal. MRN: [id number] DOB: 02/26/1946   Cancelled Treatment:    Reason Eval/Treat Not Completed: OT screened, no needs identified, will sign off. Order received, chart reviewed. Pt screened, presents at or near baseline for activities addressed by OT services, so will sign off on this very pleasant pt at present. Should his needs change, we will be happy to re-evaluate. Thank you for the referral.  Latina Craver, PhD, MS, OTR/L ascom [phone] 01/27/21, 3:47 PM

## 2021-01-27 NOTE — Progress Notes (Signed)
OT Cancellation Note  Patient Details Name: Blake Villarreal. MRN: [id number] DOB: Apr 21, 1946   Cancelled Treatment:    Reason Eval/Treat Not Completed: Medical issues which prohibited therapy. Consult received, chart reviewed. Pt noted with Hgb 6.3, HCT 20.1, and awaiting blood transfusion. Will hold OT and re-attempt at later date/time as medically appropriate.  Latina Craver, PhD, MS, OTR/L ascom [phone] 01/27/21, 8:09 AM    ,

## 2021-01-27 NOTE — Progress Notes (Addendum)
PROGRESS NOTE    Blake Villarreal.  OJJ:[id number] DOB: Jan 05, 1946 DOA: 01/22/2021 PCP: Eloisa Northern, MD  Brief Narrative:Blake W Fisher Hargadon. is a 75 y.o. male chronically ill nursing home resident with history of COPD, CAD severe peripheral vascular disease, right above-knee amputation, chronic sacral decubitus ulcers, chronic kidney disease stage unknown, chronic anemia, severe malnutrition, PTSD, chronic lymphedema was sent to ED 2/7 from [facility] due to respiratory distress patient reports being in his usual state of health yesterday,, he is bedbound and wheelchair-bound at baseline, reports feeling short short of breath x1 day, worsened over a few hours and became confused, EMS was called upon arrival his sats were noted to be around 40% on room air which quickly improved after a nonrebreather mask was placed, subsequently brought to the emergency room - noted to be covid positive with hypoxia. Hospitalist called to admit.  Assessment & Plan:   Acute Hypoxic resp failure, multifactorial, POA, resolving - Concurrent COPD exacerbation likely triggered by COVID-19 without clear indication for acute viral pneumonia - CT chest noted extensive emphysema, patchy groundglass airspace disease compatible with early infection and left lower lobe consolidation probable atelectasis - Stop remdesivir; continue solu-Medrol for shortened duration given minimal symptoms/rapid improvement -Discontinue oxygen, follow ambulatory oxygen requirements  AKI (acute kidney injury) (HCC) Hyperkalemia - Creatinine baseline around 1.6 -1.9 - Downtrending appropriately Lab Results  Component Value Date   CREATININE 1.35 (H) 01/26/2021   CREATININE 2.61 (H) 01/24/2021   CREATININE 3.29 (H) 01/23/2021   Large extensive and deep sacral decubitus wounds - Sepsis ruled out - Multiple large wounds including right sacrum which is 9X6X 4 cm with slough and black eschar, left ischium also chronic stage IV  8x5x3cm, R ischium-6x3x4cm - All wounds are stage IV, deep, 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 - Continue wound care, unable to get contrast imaging due to creatinine  Normocytic anemia, chronic iron deficiency anemia continues to worsen - Baseline hemoglobin around 7-8 - Hemoglobin 6.3 today, anemia panel suggestive of chronic disease and iron deficiency - Transfuse 1u PRBC - follow am labs - Suspect chronic blood loss from extensive sacral decubitus wounds - S/P IV iron - will continue po ferrous sulfate due to profound iron deficiency and ongoing anemia without source of bleeding  Severe protein calorie malnutrition, chronic - Albumin is 1.5 - Add supplements as tolerated - Family indicates he is a very poor eater at baseline, poor prognosis if his caloric intake continues to be profoundly poor  Severe peripheral vascular disease - Status post right AKA  COPD, history of tobacco abuse - No wheezing at this time, nebs PRN  History of CAD - No symptoms of ACS, continue aspirin, coreg, statin  DVT prophylaxis: SCDs only given ongoing anemia as above Code Status: Full Code Family Communication: Previously discussed with [name] (cousin) in detail Disposition Plan:  Status is: Inpatient  Dispo: The patient is from: SNF              Anticipated d/c is to: SNF              Anticipated d/c date is: 1 day              Patient currently is medically stable to d/c.   Difficult to place patient No  Consultants:   Palliative medicine  Subjective: No acute issues or events overnight, pleasantly resting in bed attempting to eat breakfast but denies any nausea vomiting diarrhea constipation headache fevers chest pain or shortness of  breath.  Objective: Vitals:   01/26/21 1757 01/26/21 1846 01/26/21 2151 01/27/21 0504  BP: (!) 156/80 (!) 155/66 (!) 153/72 (!) 159/74  Pulse: 70 70 70 64  Resp:  14 18 15   Temp: 98.8 F (37.1 C) 98.4 F (36.9 C) 99.4 F (37.4 C) 98.6 F (37 C)   TempSrc: Oral  Oral   SpO2: 100% 100% 100% 100%  Weight:      Height:        Intake/Output Summary (Last 24 hours) at 01/27/2021 03/27/2021 Last data filed at 01/26/2021 1800 Gross per 24 hour  Intake 1841.83 ml  Output 300 ml  Net 1541.83 ml   Filed Weights   01/24/21 2150 01/25/21 0300 01/26/21 0343  Weight: 53.5 kg 54.2 kg 54.3 kg    Examination:  Chronically ill appearing, sitting up in bed, awake alert oriented to self place only, no distress CVS: S1-S2, regular rate rhythm Lungs: Decreased breath sounds the bases, few scattered rhonchi Abdomen: Soft, nontender, bowel sounds present Extremities: Right AKA, left leg with chronic skin changes, hyperpigmentation and scaling Skin: Multiple large and deep sacral decubitus wounds with eschar Neuro: Moves both upper extremities and left leg, no focal deficits  Data Reviewed:   CBC: Recent Labs  Lab 01/22/21 1035 01/22/21 2232 01/23/21 0649 01/24/21 0613 01/26/21 0513  WBC 17.6* 17.9* 16.6* 12.5* 9.9  NEUTROABS 12.8*  --   --   --   --   HGB 7.1* 7.2* 7.0* 7.2* 6.3*  HCT 24.1* 23.3* 23.1* 22.9* 20.1*  MCV 84.9 84.1 84.0 80.9 82.0  PLT 641* 595* 576* 601* 554*   Basic Metabolic Panel: Recent Labs  Lab 01/22/21 1035 01/22/21 2232 01/23/21 0649 01/24/21 0613 01/26/21 0513  NA 136  --  137 137 136  K 5.4*  --  4.8 4.4 4.2  CL 102  --  106 107 109  CO2 21*  --  16* 19* 18*  GLUCOSE 126*  --  206* 223* 160*  BUN 60*  --  55* 50* 42*  CREATININE 4.16* 3.57* 3.29* 2.61* 1.35*  CALCIUM 8.6*  --  8.0* 8.2* 7.8*   GFR: Estimated Creatinine Clearance: 36.3 mL/min (A) (by C-G formula based on SCr of 1.35 mg/dL (H)). Liver Function Tests: Recent Labs  Lab 01/22/21 1035 01/23/21 0649 01/24/21 0613 01/26/21 0513  AST 44* 37 31 73*  ALT 21 18 21  52*  ALKPHOS 91 81 77 74  BILITOT 0.5 0.5 0.4 0.3  PROT 7.5 6.2* 6.4* 5.6*  ALBUMIN 1.7* 1.5* 1.5* 1.4*   No results for input(s): LIPASE, AMYLASE in the last 168  hours. Recent Labs  Lab 01/22/21 1035  AMMONIA 20   Coagulation Profile: Recent Labs  Lab 01/22/21 1035  INR 1.2   Cardiac Enzymes: No results for input(s): CKTOTAL, CKMB, CKMBINDEX, TROPONINI in the last 168 hours. BNP (last 3 results) No results for input(s): PROBNP in the last 8760 hours. HbA1C: No results for input(s): HGBA1C in the last 72 hours. CBG: Recent Labs  Lab 01/25/21 2000 01/26/21 0843 01/26/21 1149 01/26/21 1729 01/26/21 2013  GLUCAP 143* 145* 238* 181* 156*   Lipid Profile: No results for input(s): CHOL, HDL, LDLCALC, TRIG, CHOLHDL, LDLDIRECT in the last 72 hours. Thyroid Function Tests: No results for input(s): TSH, T4TOTAL, FREET4, T3FREE, THYROIDAB in the last 72 hours. Anemia Panel: No results for input(s): VITAMINB12, FOLATE, FERRITIN, TIBC, IRON, RETICCTPCT in the last 72 hours. Urine analysis: No results found for: COLORURINE, APPEARANCEUR, LABSPEC, PHURINE, GLUCOSEU, HGBUR,  BILIRUBINUR, KETONESUR, PROTEINUR, UROBILINOGEN, NITRITE, LEUKOCYTESUR  Recent Results (from the past 240 hour(s))  Blood culture (routine single)     Status: None   Collection Time: 01/22/21 10:35 AM   Specimen: BLOOD  Result Value Ref Range Status   Specimen Description BLOOD BLOOD RIGHT HAND  Final   Special Requests   Final    BOTTLES DRAWN AEROBIC AND ANAEROBIC Blood Culture results may not be optimal due to an inadequate volume of blood received in culture bottles   Culture   Final    NO GROWTH 5 DAYS Performed at [facility], [address] [place], [place] [address]    Report Status 01/27/2021 FINAL  Final  SARS Coronavirus 2 by RT PCR (hospital order, performed in [facility] hospital lab) Nasopharyngeal Nasopharyngeal Swab     Status: Abnormal   Collection Time: 01/22/21 10:35 AM   Specimen: Nasopharyngeal Swab  Result Value Ref Range Status   SARS Coronavirus 2 POSITIVE (A) NEGATIVE Final    Comment: RESULT CALLED TO, READ BACK BY AND VERIFIED  WITH: REED RENO RN AT 1133 ON 01/22/21 SNG (NOTE) SARS-CoV-2 target nucleic acids are DETECTED  SARS-CoV-2 RNA is generally detectable in upper respiratory specimens  during the acute phase of infection.  Positive results are indicative  of the presence of the identified virus, but do not rule out bacterial infection or co-infection with other pathogens not detected by the test.  Clinical correlation with patient history and  other diagnostic information is necessary to determine patient infection status.  The expected result is negative.  Fact Sheet for Patients:   BoilerBrush.com.cy   Fact Sheet for Healthcare Providers:   https://pope.com/    This test is not yet approved or cleared by the [place] FDA and  has been authorized for detection and/or diagnosis of SARS-CoV-2 by FDA under an Emergency Use Authorization (EUA).  This EUA will remain in effect (meaning this te st can be used) for the duration of  the COVID-19 declaration under Section 564(b)(1) of the Act, 21 U.S.C. section 360-bbb-3(b)(1), unless the authorization is terminated or revoked sooner.  Performed at [facility], [address], [place], [place] [address]     Radiology Studies: No results found.  Scheduled Meds: . vitamin C  500 mg Oral BID  . carvedilol  6.25 mg Oral BID WC  . collagenase   Topical Daily  . feeding supplement (NEPRO CARB STEADY)  237 mL Oral TID WC  . heparin  5,000 Units Subcutaneous Q8H  . insulin aspart  0-9 Units Subcutaneous TID WC  . methylPREDNISolone (SOLU-MEDROL) injection  60 mg Intravenous Q24H  . multivitamin with minerals  1 tablet Oral Daily   Continuous Infusions: . sodium chloride 75 mL/hr at 01/26/21 2152     LOS: 5 days    Time spent:  Carma Leaven DO  [facility] 01/27/2021, 7:23 AM

## 2021-01-28 DIAGNOSIS — J9621 Acute and chronic respiratory failure with hypoxia: Secondary | ICD-10-CM | POA: Diagnosis not present

## 2021-01-28 DIAGNOSIS — N179 Acute kidney failure, unspecified: Secondary | ICD-10-CM | POA: Diagnosis not present

## 2021-01-28 DIAGNOSIS — L89154 Pressure ulcer of sacral region, stage 4: Secondary | ICD-10-CM | POA: Diagnosis not present

## 2021-01-28 DIAGNOSIS — R0902 Hypoxemia: Secondary | ICD-10-CM | POA: Diagnosis not present

## 2021-01-28 LAB — COMPREHENSIVE METABOLIC PANEL
ALT: 43 U/L (ref 0–44)
AST: 36 U/L (ref 15–41)
Albumin: 1.5 g/dL — ABNORMAL LOW (ref 3.5–5.0)
Alkaline Phosphatase: 77 U/L (ref 38–126)
Anion gap: 6 (ref 5–15)
BUN: 31 mg/dL — ABNORMAL HIGH (ref 8–23)
CO2: 22 mmol/L (ref 22–32)
Calcium: 8 mg/dL — ABNORMAL LOW (ref 8.9–10.3)
Chloride: 108 mmol/L (ref 98–111)
Creatinine, Ser: 1 mg/dL (ref 0.61–1.24)
GFR, Estimated: >60 mL/min (ref >60)
Glucose, Bld: 139 mg/dL — ABNORMAL HIGH (ref 70–99)
Potassium: 3.9 mmol/L (ref 3.5–5.1)
Sodium: 136 mmol/L (ref 135–145)
Total Bilirubin: 0.2 mg/dL — ABNORMAL LOW (ref 0.3–1.2)
Total Protein: 5.4 g/dL — ABNORMAL LOW (ref 6.5–8.1)

## 2021-01-28 LAB — CBC
HCT: 23.9 % — ABNORMAL LOW (ref 39.0–52.0)
Hemoglobin: 7.6 g/dL — ABNORMAL LOW (ref 13.0–17.0)
MCH: 25.9 pg — ABNORMAL LOW (ref 26.0–34.0)
MCHC: 31.8 g/dL (ref 30.0–36.0)
MCV: 81.3 fL (ref 80.0–100.0)
Platelets: 514 10*3/uL — ABNORMAL HIGH (ref 150–400)
RBC: 2.94 MIL/uL — ABNORMAL LOW (ref 4.22–5.81)
RDW: 18.8 % — ABNORMAL HIGH (ref 11.5–15.5)
WBC: 12.9 10*3/uL — ABNORMAL HIGH (ref 4.0–10.5)
nRBC: 0 % (ref 0.0–0.2)

## 2021-01-28 LAB — GLUCOSE, CAPILLARY
Glucose-Capillary: 172 mg/dL — ABNORMAL HIGH (ref 70–99)
Glucose-Capillary: 208 mg/dL — ABNORMAL HIGH (ref 70–99)

## 2021-01-28 MED ORDER — NYSTATIN 100000 UNIT/ML MT SUSP
5.0000 mL | Freq: Four times a day (QID) | OROMUCOSAL | Status: DC
  Administered 2021-01-28: 500000 [IU] via ORAL
  Filled 2021-01-28: qty 5

## 2021-01-28 MED ORDER — CARVEDILOL 6.25 MG PO TABS: 6.2500 mg | ORAL_TABLET | Freq: Two times a day (BID) | ORAL | 0 refills | Status: DC

## 2021-01-28 MED ORDER — NYSTATIN 100000 UNIT/ML MT SUSP: 5.0000 mL | Freq: Four times a day (QID) | OROMUCOSAL | Status: AC

## 2021-01-28 MED ORDER — CARVEDILOL 6.25 MG PO TABS: 6.2500 mg | ORAL_TABLET | Freq: Two times a day (BID) | ORAL | 1 refills | Status: AC

## 2021-01-28 MED ORDER — PREDNISONE 10 MG PO TABS: ORAL_TABLET | ORAL | 0 refills | Status: DC

## 2021-01-28 MED ORDER — FERROUS SULFATE 75 (15 FE) MG/ML PO SOLN: 65.0000 mg | Freq: Three times a day (TID) | ORAL | 0 refills | Status: DC

## 2021-01-28 MED ORDER — NYSTATIN 100000 UNIT/ML MT SUSP: 5.0000 mL | Freq: Four times a day (QID) | OROMUCOSAL | 0 refills | Status: DC

## 2021-01-28 MED ORDER — FERROUS SULFATE 75 (15 FE) MG/ML PO SOLN: 65.0000 mg | Freq: Three times a day (TID) | ORAL | Status: AC

## 2021-01-28 NOTE — TOC Transition Note (Addendum)
Transition of Care [facility]) - CM/SW Discharge Note   Patient Details  Name: Blake Villarreal. MRN: [id number] Date of Birth: [birth year]  Transition of Care [facility]) CM/SW Contact:  Bing Quarry, RN Phone Number: 01/28/2021, 12:52 PM   Clinical Narrative:   01/28/21 1300  Received okay to transfer back to [facility] per Corlis Hove, Director. Aware of Covid status.   She just asked for DC summary to be faxed to her number.   Notified provider and Unit RN assigned to patient. Provider messaged to change disposition to [facility] from HH/PT after clarification was made.   Will transport via [facility] to room 34A at [facility] area.   Director stated PT could be provided is needed but after Covid quarantine.   Facesheet and Med. Nec form will be printed to unit.   [facility] address: [address] [place], [place] [address] [phone].   Corlis Hove Director [phone].  Gabriel Cirri RN CM  1326 Update. [facility] notified of transport. Will arrive in about an hour. Discharge summary faxed to Corlis Hove, Director at [phone] and [phone] per request as well as uploaded to [facility] via Epic.  Discharge disposition to [facility] in DC summary. Facesheet and Medical Necessity form printed to unit fax [facility] Communications all along with provider and Unit RN assigned to patient. Iver Nestle notified by provider and CM of transfer today. Gabriel Cirri RN CM  Final next level of care: Long Term Acute Care (LTAC) [facility] prior resident) Barriers to Discharge: Barriers Resolved   Patient Goals and CMS Choice Patient states their goals for this hospitalization and ongoing recovery are:: get back to where I can smoke   Choice offered to / list presented to : Patient (Patient returning to [facility] by choice.)  Discharge Placement              Patient chooses bed at: [facility] Patient to be transferred to facility by: [facility] Name of family member notified:  [name] (cousin) Patient and family notified of of transfer: 01/28/21  Discharge Plan and Services                DME Arranged: N/A DME Agency: NA                  Social Determinants of Health (SDOH) Interventions     Readmission Risk Interventions No flowsheet data found.

## 2021-01-28 NOTE — Plan of Care (Addendum)
  Problem: Nutrition: Goal: Adequate nutrition will be maintained Outcome: Progressing   Problem: Coping: Goal: Level of anxiety will decrease Outcome: Progressing   Problem: Elimination: Goal: Will not experience complications related to bowel motility Outcome: Progressing Goal: Will not experience complications related to urinary retention Outcome: Progressing   Problem: Pain Managment: Goal: General experience of comfort will improve Outcome: Progressing   Problem: Safety: Goal: Ability to remain free from injury will improve Outcome: Progressing  Problem: Activity: Goal: Risk for activity intolerance will decrease Outcome: Not Progressing   Problem: Skin Integrity: Goal: Risk for impaired skin integrity will decrease 01/28/2021 0638 by Narda Amber, RN Outcome: Not Progressing

## 2021-01-28 NOTE — TOC Transition Note (Signed)
Transition of Care [facility]) - CM/SW Discharge Note   Patient Details  Name: Blake Villarreal. MRN: [id number] Date of Birth: 1946-03-25  Transition of Care [facility]) CM/SW Contact:  Bing Quarry, RN Phone Number: 01/28/2021, 11:39 AM   Clinical Narrative:  11/27/21 1145 Patient to be discharged and returned to place of residence PTA, which was [facility]. AC/Director Corlis Hove [phone]) notified of impending discharge and needs for readmission. She is checking patient status and re-admission due to Covid positive status. Patient was Covid + upon testing in ER with respiratory distress symptoms. Waiting on return call to complete discharge planning/disposition. Discharge orders clarified with provider and it is to be back to where patient was admitted from on 01/22/21.  Gabriel Cirri RN CM      Final next level of care: Long Term Acute Care (LTAC) [facility] prior resident) Barriers to Discharge: Barriers Resolved   Patient Goals and CMS Choice Patient states their goals for this hospitalization and ongoing recovery are:: get back to where I can smoke      Discharge Placement              Patient chooses bed at:  (Going back to PTA placement at [facility] pending approval for readmission.) Patient to be transferred to facility by: [facility] Name of family member notified: [name] (cousin) Patient and family notified of of transfer: 01/28/21  Discharge Plan and Services                DME Arranged: N/A DME Agency: NA                  Social Determinants of Health (SDOH) Interventions     Readmission Risk Interventions No flowsheet data found.

## 2021-01-28 NOTE — TOC Progression Note (Addendum)
Transition of Care (TOC) - Progression Note    Patient Details  Name: Blake Villarreal. MRN: [id number] Date of Birth: [birth year]  Transition of Care [facility]) CM/SW Contact  Bing Quarry, RN Phone Number: 01/28/2021, 11:16 AM  Clinical Narrative:  01/28/21 1120  Patient admitted from [facility] care to [facility] ED 2/7 with respiratory distress. Tested positive for Covid. Chronically ill nursing home resident with multiple co-morbidities per provider notes. Bed and wheelchair bound at baseline per therapy notes. OT eval places patient back to baseline. PT evaluation recommends STR transition post discharge, but unable to reach family per notes.   Have contacted [facility] and Director to check on status of patient returning as SNF or LTACH and also given Covid status.   Clarifying discharge orders for Home with [facility] PT per provider while waiting to hear back from [facility]/Tonya at [phone].   Gabriel Cirri RN CM  1133 Update. Spoke with provider, patient to go back to where he was admitted from, [facility]. I spoke with cousin [name] at [phone]. She confirmed he has been in long term acute care at [facility] since September. Prior to that he had been at [facility] and moved because [facility] will allow him to smoke. Notified daughter of potential transfer pending facility approval and she has just spoke with provider as well. Gabriel Cirri RN CM       Expected Discharge Plan and Services           Expected Discharge Date: 01/28/21                                     Social Determinants of Health (SDOH) Interventions    Readmission Risk Interventions No flowsheet data found.

## 2021-01-28 NOTE — Discharge Summary (Signed)
Physician Discharge Summary  Blake Villarreal. ZOX:[id number] DOB: Oct 13, 1946 DOA: 01/22/2021  PCP: Eloisa Northern, MD  Admit date: 01/22/2021 Discharge date: 01/28/2021  Admitted From: LTAC Disposition: LTAC  Recommendations for Outpatient Follow-up:  1. Follow up with PCP in 1-2 weeks 2. Please obtain BMP/CBC in one week 3. Please follow up with heme-onc in the next 1 to 2 weeks as discussed for further evaluation over chronic anemia  Discharge Condition: Stable CODE STATUS: Full Diet recommendation: As tolerated regular diet  Brief/Interim Summary: Blake Villarrealis a 75 y.o.malechronically ill nursing home resident with history of COPD, CAD severe peripheral vascular disease, right above-knee amputation, chronic sacral decubitus ulcers, chronic kidney disease stage unknown, chronic anemia, severe malnutrition, PTSD, chronic lymphedema was sent to ED 2/7 from [facility] due to respiratory distress patient reports being in his usual state of health yesterday,, he is bedbound and wheelchair-bound at baseline, reports feeling short short of breath x1 day, worsened over a few hours and became confused, EMS was called upon arrival his sats were noted to be around 40% on room air which quickly improved after a nonrebreather mask was placed,subsequently brought to the emergency room - noted to be covid positive with hypoxia. Hospitalist called to admit.  Patient admitted as above with acute hypoxic respiratory failure likely in the setting of COPD exacerbation status post COVID-19 pneumonia.  Patient was able to wean off oxygen quite quickly, patient did have profound AKI as well in the setting of poor p.o. intake which resolved with IV fluids.  Patient has chronic iron deficiency anemia which did worsen over time requiring transfusion here in house.  Patient also has notably large extensive deep sacral decubitus ulcers which appear to be stable, likely exacerbating patient's chronic  anemia given ongoing scant bleeding from the sites.  At this time patient is off oxygen otherwise stable and agreeable for discharge back to LTAC.  Discussed with family and otherwise patient stable for discharge.  Discharge Diagnoses:  Active Problems:   AKI (acute kidney injury) (HCC)   Acute on chronic respiratory failure with hypoxia (HCC)   Decubitus ulcer of sacral region, stage 4 (HCC)   Severe protein-energy malnutrition (HCC)   S/P AKA (above knee amputation) (HCC)   Hypoxia    Discharge Instructions  Discharge Instructions    Call MD for:  extreme fatigue   Complete by: As directed    Call MD for:  persistant dizziness or light-headedness   Complete by: As directed    Call MD for:  redness, tenderness, or signs of infection (pain, swelling, redness, odor or green/yellow discharge around incision site)   Complete by: As directed    Call MD for:  severe uncontrolled pain   Complete by: As directed    Diet - low sodium heart healthy   Complete by: As directed    Discharge wound care:   Complete by: As directed    Apply Santyl to bilat ischium and sacrum wounds daily, then cover with moist fluffed gauze and ABD pad and tape Apply xeroform gauze to posterior scrotum/perineum daily   Increase activity slowly   Complete by: As directed      Allergies as of 01/28/2021   No Known Allergies     Medication List    STOP taking these medications   chlorthalidone 25 MG tablet Commonly known as: HYGROTON   doxycycline 100 MG tablet Commonly known as: VIBRA-TABS     TAKE these medications   amLODipine 10  MG tablet Commonly known as: NORVASC Take 10 mg by mouth daily.   atorvastatin 40 MG tablet Commonly known as: LIPITOR Take 40 mg by mouth every evening.   carvedilol 6.25 MG tablet Commonly known as: COREG Take 1 tablet (6.25 mg total) by mouth 2 (two) times daily with a meal. What changed:   medication strength  how much to take  when to take this    ferrous sulfate 75 (15 Fe) MG/ML Soln Commonly known as: FER-IN-SOL Take 4.3 mLs (65 mg of iron total) by mouth 3 (three) times daily with meals.   folic acid 1 MG tablet Commonly known as: FOLVITE Take 1 mg by mouth daily.   gabapentin 100 MG capsule Commonly known as: NEURONTIN Take 200 mg by mouth every 8 (eight) hours.   metFORMIN 500 MG tablet Commonly known as: GLUCOPHAGE Take 500 mg by mouth daily with breakfast.   mirtazapine 7.5 MG tablet Commonly known as: REMERON Take 7.5 mg by mouth at bedtime.   nystatin 100000 UNIT/ML suspension Commonly known as: MYCOSTATIN Take 5 mLs (500,000 Units total) by mouth 4 (four) times daily for 7 days.   omeprazole 20 MG capsule Commonly known as: PRILOSEC Take 20 mg by mouth 2 (two) times daily before a meal.   predniSONE 10 MG tablet Commonly known as: DELTASONE Take 4 tablets (40 mg total) by mouth daily for 3 days, THEN 3 tablets (30 mg total) daily for 3 days, THEN 2 tablets (20 mg total) daily for 3 days, THEN 1 tablet (10 mg total) daily for 3 days. Start taking on: January 28, 2021   sertraline 50 MG tablet Commonly known as: ZOLOFT Take 50 mg by mouth daily.   tamsulosin 0.4 MG Caps capsule Commonly known as: FLOMAX Take 0.4 mg by mouth.            Discharge Care Instructions  (From admission, onward)         Start     Ordered   01/28/21 0000  Discharge wound care:       Comments: Apply Santyl to bilat ischium and sacrum wounds daily, then cover with moist fluffed gauze and ABD pad and tape Apply xeroform gauze to posterior scrotum/perineum daily   01/28/21 1031          No Known Allergies  Consultations:  None   Procedures/Studies: CT CHEST WO CONTRAST  Result Date: 01/22/2021 CLINICAL DATA:  Respiratory failure, COVID-19 positive, renal insufficiency, diabetes EXAM: CT CHEST WITHOUT CONTRAST TECHNIQUE: Multidetector CT imaging of the chest was performed following the standard protocol  without IV contrast. COMPARISON:  01/22/2021 FINDINGS: Cardiovascular: Unenhanced imaging of the heart and great vessels demonstrates no significant pericardial effusion. No evidence of thoracic aortic aneurysm. Evaluation of the aortic lumen is limited without IV contrast. Diffuse atherosclerosis of the aorta and coronary vasculature. Mediastinum/Nodes: No enlarged mediastinal or axillary lymph nodes. Thyroid gland, trachea, and esophagus demonstrate no significant findings. Lungs/Pleura: Upper lobe predominant emphysema. There are scattered areas of subpleural ground-glass airspace disease within the right upper and right middle lobes, compatible with early infection. There is circumferential left pleural thickening and calcification, consistent with prior surgery or trauma. Rounded consolidation within the left lower lobe most compatible with rounded atelectasis. No effusion or pneumothorax. There is mucoid material within the right mainstem bronchus. Right lower lobe bronchial wall thickening is identified consistent with bronchitis or reactive airway disease. Upper Abdomen: No acute abnormality. Musculoskeletal: No acute or destructive bony lesions. IMPRESSION: 1. Upper lobe  predominant emphysema, with patchy right upper and right middle lobe subpleural ground-glass airspace disease compatible with early infection or inflammation. 2. Right lower lobe bronchial wall thickening consistent with bronchitis or reactive airway disease. 3. Left pleural thickening and calcification consistent with previous trauma or surgery. Rounded left lower lobe consolidation most compatible with rounded atelectasis. 4. Aortic Atherosclerosis (ICD10-I70.0) and Emphysema (ICD10-J43.9). Electronically Signed   By: Sharlet SalinaMichael  Brown M.D.   On: 01/22/2021 16:58   US RENAL  Result Date: 01/22/2021 CLINICAL DATA:  Acute kidney injury EXAM: RENAL / URINARY TRACT ULTRASOUND COMPLETE COMPARISON:  None. FINDINGS: Right Kidney: Renal  measurements: 10.7 x 4.5 x 4.5 cm = volume: 112.6 mL. Diffusely increased renal cortical echogenicity. 9.6 mm shadowing calculus is seen upper pole. There is mild pelviectasis without calyceal dilatation. No frank hydronephrosis. No concerning renal mass. Left Kidney: Renal measurements: 10.5 x 5.9 x 5.3 cm = volume: 170 mL. Diffusely increased renal cortical echogenicity. 9.4 mm shadowing calculus in the interpolar left kidney. No significant urinary tract dilatation. No concerning renal mass. Bladder: Appears normal for degree of bladder distention. Bilateral bladder jets are identified. Other: None. IMPRESSION: 1. Diffusely increased renal cortical echogenicity compatible with medical renal disease. 2. Bilateral nonobstructing renal calculi. 3. Mild right pelviectasis without frank hydronephrosis. Presence of a visible right bladder jet argues against the presence of obstruction, may reflect extrarenal pelvis or benign incidental. Electronically Signed   By: Kreg ShropshirePrice  DeHay M.D.   On: 01/22/2021 17:24   DG Chest Port 1 View  Result Date: 01/22/2021 CLINICAL DATA:  Respiratory distress and altered mental status. EXAM: PORTABLE CHEST 1 VIEW COMPARISON:  None. FINDINGS: Atherosclerotic calcification of the aortic arch. Tortuous thoracic aorta. Heart size within normal limits for projection. Abnormal blunting of the left lateral costophrenic angle. Linear bandlike scarring peripherally in the left lung base. There is some hazy density at the left lung base, possibly from layering pleural fluid. Volume loss at the left lung base. The right lung appears clear. IMPRESSION: 1. Hazy density at the left lung base, potentially from layering pleural fluid. Blunted left lateral costophrenic angle. 2. Scarring peripherally in the left lung base with some mild left basilar volume loss. 3. Tortuous thoracic aorta.  Aortic Atherosclerosis (ICD10-I70.0). Electronically Signed   By: Gaylyn RongWalter  Liebkemann M.D.   On: 01/22/2021 11:23       Subjective: No acute issues or events overnight   Discharge Exam: Vitals:   01/28/21 0348 01/28/21 0825  BP: (!) 152/58 134/82  Pulse: (!) 55 (!) 53  Resp: 16 16  Temp: 98.7 F (37.1 C) 98 F (36.7 C)  SpO2: 100% 100%   Vitals:   01/27/21 1618 01/27/21 2155 01/28/21 0348 01/28/21 0825  BP: 140/63 (!) 156/61 (!) 152/58 134/82  Pulse: 75 61 (!) 55 (!) 53  Resp: 16 16 16 16   Temp: 98.9 F (37.2 C) 98.3 F (36.8 C) 98.7 F (37.1 C) 98 F (36.7 C)  TempSrc: Oral Oral    SpO2: 100% 100% 100% 100%  Weight:      Height:       Chronically ill appearing, sitting up in bed, awake alert oriented to self place only, no distress CVS: S1-S2, regular rate rhythm Lungs: Decreased breath sounds the bases, few scattered rhonchi Abdomen: Soft, nontender, bowel sounds present Extremities: Right AKA, left leg with chronic skin changes, hyperpigmentation and scaling Skin: Multiple large and deep sacral decubitus wounds with eschar Neuro: Moves both upper extremities and left leg, no focal deficits  The results of significant diagnostics from this hospitalization (including imaging, microbiology, ancillary and laboratory) are listed below for reference.     Microbiology: Recent Results (from the past 240 hour(s))  Blood culture (routine single)     Status: None   Collection Time: 01/22/21 10:35 AM   Specimen: BLOOD  Result Value Ref Range Status   Specimen Description BLOOD BLOOD RIGHT HAND  Final   Special Requests   Final    BOTTLES DRAWN AEROBIC AND ANAEROBIC Blood Culture results may not be optimal due to an inadequate volume of blood received in culture bottles   Culture   Final    NO GROWTH 5 DAYS Performed at [facility], [address] [place], [place] [address]    Report Status 01/27/2021 FINAL  Final  SARS Coronavirus 2 by RT PCR (hospital order, performed in [facility] hospital lab) Nasopharyngeal Nasopharyngeal Swab     Status: Abnormal   Collection  Time: 01/22/21 10:35 AM   Specimen: Nasopharyngeal Swab  Result Value Ref Range Status   SARS Coronavirus 2 POSITIVE (A) NEGATIVE Final    Comment: RESULT CALLED TO, READ BACK BY AND VERIFIED WITH: REED RENO RN AT 1133 ON 01/22/21 SNG (NOTE) SARS-CoV-2 target nucleic acids are DETECTED  SARS-CoV-2 RNA is generally detectable in upper respiratory specimens  during the acute phase of infection.  Positive results are indicative  of the presence of the identified virus, but do not rule out bacterial infection or co-infection with other pathogens not detected by the test.  Clinical correlation with patient history and  other diagnostic information is necessary to determine patient infection status.  The expected result is negative.  Fact Sheet for Patients:   BoilerBrush.com.cy   Fact Sheet for Healthcare Providers:   https://pope.com/    This test is not yet approved or cleared by the [place] FDA and  has been authorized for detection and/or diagnosis of SARS-CoV-2 by FDA under an Emergency Use Authorization (EUA).  This EUA will remain in effect (meaning this te st can be used) for the duration of  the COVID-19 declaration under Section 564(b)(1) of the Act, 21 U.S.C. section 360-bbb-3(b)(1), unless the authorization is terminated or revoked sooner.  Performed at [facility], [address] [place], [place] [address]   MRSA PCR Screening     Status: None   Collection Time: 01/23/21  6:25 AM   Specimen: Nasal Mucosa; Nasopharyngeal  Result Value Ref Range Status   MRSA by PCR NEGATIVE NEGATIVE Final    Comment:        The GeneXpert MRSA Assay (FDA approved for NASAL specimens only), is one component of a comprehensive MRSA colonization surveillance program. It is not intended to diagnose MRSA infection nor to guide or monitor treatment for MRSA infections. Performed at [facility], [address] [place], [place] [address]      Labs: BNP (last 3 results) No results for input(s): BNP in the last 8760 hours. Basic Metabolic Panel: Recent Labs  Lab 01/23/21 0649 01/24/21 0613 01/26/21 0513 01/27/21 0731 01/28/21 0606  NA 137 137 136 137 136  K 4.8 4.4 4.2 3.5 3.9  CL 106 107 109 109 108  CO2 16* 19* 18* 19* 22  GLUCOSE 206* 223* 160* 124* 139*  BUN 55* 50* 42* 33* 31*  CREATININE 3.29* 2.61* 1.35* 1.13 1.00  CALCIUM 8.0* 8.2* 7.8* 7.9* 8.0*   Liver Function Tests: Recent Labs  Lab 01/23/21 8657 01/24/21 8469 01/26/21  6861 01/27/21 0731 01/28/21 0606  AST 37 31 73* 42* 36  ALT 18 21 52* 45* 43  ALKPHOS 81 77 74 71 77  BILITOT 0.5 0.4 0.3 0.2* 0.2*  PROT 6.2* 6.4* 5.6* 5.4* 5.4*  ALBUMIN 1.5* 1.5* 1.4* 1.4* 1.5*   No results for input(s): LIPASE, AMYLASE in the last 168 hours. Recent Labs  Lab 01/22/21 1035  AMMONIA 20   CBC: Recent Labs  Lab 01/22/21 1035 01/22/21 2232 01/23/21 0649 01/24/21 0613 01/26/21 0513 01/27/21 0731 01/28/21 0606  WBC 17.6*   < > 16.6* 12.5* 9.9 12.8* 12.9*  NEUTROABS 12.8*  --   --   --   --   --   --   HGB 7.1*   < > 7.0* 7.2* 6.3* 7.7* 7.6*  HCT 24.1*   < > 23.1* 22.9* 20.1* 23.5* 23.9*  MCV 84.9   < > 84.0 80.9 82.0 81.0 81.3  PLT 641*   < > 576* 601* 554* 537* 514*   < > = values in this interval not displayed.   Cardiac Enzymes: No results for input(s): CKTOTAL, CKMB, CKMBINDEX, TROPONINI in the last 168 hours. BNP: Invalid input(s): POCBNP CBG: Recent Labs  Lab 01/27/21 0954 01/27/21 1300 01/27/21 1650 01/27/21 2206 01/28/21 0825  GLUCAP 190* 212* 261* 210* 172*   D-Dimer No results for input(s): DDIMER in the last 72 hours. Hgb A1c No results for input(s): HGBA1C in the last 72 hours. Lipid Profile No results for input(s): CHOL, HDL, LDLCALC, TRIG, CHOLHDL, LDLDIRECT in the last 72 hours. Thyroid function studies No results for input(s): TSH, T4TOTAL, T3FREE, THYROIDAB in the last 72  hours.  Invalid input(s): FREET3 Anemia work up No results for input(s): VITAMINB12, FOLATE, FERRITIN, TIBC, IRON, RETICCTPCT in the last 72 hours. Urinalysis No results found for: COLORURINE, APPEARANCEUR, LABSPEC, PHURINE, GLUCOSEU, HGBUR, BILIRUBINUR, KETONESUR, PROTEINUR, UROBILINOGEN, NITRITE, LEUKOCYTESUR Sepsis Labs Invalid input(s): PROCALCITONIN,  WBC,  LACTICIDVEN Microbiology Recent Results (from the past 240 hour(s))  Blood culture (routine single)     Status: None   Collection Time: 01/22/21 10:35 AM   Specimen: BLOOD  Result Value Ref Range Status   Specimen Description BLOOD BLOOD RIGHT HAND  Final   Special Requests   Final    BOTTLES DRAWN AEROBIC AND ANAEROBIC Blood Culture results may not be optimal due to an inadequate volume of blood received in culture bottles   Culture   Final    NO GROWTH 5 DAYS Performed at [facility], [address] [place], [place] [address]    Report Status 01/27/2021 FINAL  Final  SARS Coronavirus 2 by RT PCR (hospital order, performed in [facility] hospital lab) Nasopharyngeal Nasopharyngeal Swab     Status: Abnormal   Collection Time: 01/22/21 10:35 AM   Specimen: Nasopharyngeal Swab  Result Value Ref Range Status   SARS Coronavirus 2 POSITIVE (A) NEGATIVE Final    Comment: RESULT CALLED TO, READ BACK BY AND VERIFIED WITH: REED RENO RN AT 1133 ON 01/22/21 SNG (NOTE) SARS-CoV-2 target nucleic acids are DETECTED  SARS-CoV-2 RNA is generally detectable in upper respiratory specimens  during the acute phase of infection.  Positive results are indicative  of the presence of the identified virus, but do not rule out bacterial infection or co-infection with other pathogens not detected by the test.  Clinical correlation with patient history and  other diagnostic information is necessary to determine patient infection status.  The expected result is negative.  Fact Sheet for Patients:  BoilerBrush.com.cy   Fact Sheet for Healthcare Providers:   https://pope.com/    This test is not yet approved or cleared by the [place] FDA and  has been authorized for detection and/or diagnosis of SARS-CoV-2 by FDA under an Emergency Use Authorization (EUA).  This EUA will remain in effect (meaning this te st can be used) for the duration of  the COVID-19 declaration under Section 564(b)(1) of the Act, 21 U.S.C. section 360-bbb-3(b)(1), unless the authorization is terminated or revoked sooner.  Performed at [facility], [address] [place], [place] [address]   MRSA PCR Screening     Status: None   Collection Time: 01/23/21  6:25 AM   Specimen: Nasal Mucosa; Nasopharyngeal  Result Value Ref Range Status   MRSA by PCR NEGATIVE NEGATIVE Final    Comment:        The GeneXpert MRSA Assay (FDA approved for NASAL specimens only), is one component of a comprehensive MRSA colonization surveillance program. It is not intended to diagnose MRSA infection nor to guide or monitor treatment for MRSA infections. Performed at [facility], [address] [place], [place] [address]      Time coordinating discharge: Over 30 minutes  SIGNED:   Azucena Fallen, DO [facility] 01/28/2021, 10:32 AM Pager   If 7PM-7AM, please contact night-coverage [email or web address]

## 2021-01-29 ENCOUNTER — Encounter: Payer: Self-pay | Admitting: Intensive Care

## 2021-01-31 LAB — BLOOD GAS, VENOUS
Acid-base deficit: 4 mmol/L — ABNORMAL HIGH (ref 0.0–2.0)
Bicarbonate: 22.1 mmol/L (ref 20.0–28.0)
O2 Saturation: 25.8 %
Patient temperature: 37
pCO2, Ven: 45 mmHg (ref 44.0–60.0)
pH, Ven: 7.3 (ref 7.250–7.430)

## 2021-02-06 ENCOUNTER — Inpatient Hospital Stay
Admission: EM | Admit: 2021-02-06 | Discharge: 2021-02-13 | DRG: 871 | Disposition: A | Discharge status: Skilled Nursing Facility | Payer: No Typology Code available for payment source | Source: Skilled Nursing Facility | Attending: Internal Medicine | Admitting: Internal Medicine

## 2021-02-06 ENCOUNTER — Emergency Department: Payer: No Typology Code available for payment source

## 2021-02-06 ENCOUNTER — Other Ambulatory Visit: Payer: Self-pay

## 2021-02-06 ENCOUNTER — Encounter: Payer: Self-pay | Admitting: *Deleted

## 2021-02-06 DIAGNOSIS — M4628 Osteomyelitis of vertebra, sacral and sacrococcygeal region: Secondary | ICD-10-CM | POA: Diagnosis present

## 2021-02-06 DIAGNOSIS — E1151 Type 2 diabetes mellitus with diabetic peripheral angiopathy without gangrene: Secondary | ICD-10-CM | POA: Diagnosis present

## 2021-02-06 DIAGNOSIS — Z91041 Radiographic dye allergy status: Secondary | ICD-10-CM

## 2021-02-06 DIAGNOSIS — T383X5A Adverse effect of insulin and oral hypoglycemic [antidiabetic] drugs, initial encounter: Secondary | ICD-10-CM | POA: Diagnosis present

## 2021-02-06 DIAGNOSIS — G9340 Encephalopathy, unspecified: Secondary | ICD-10-CM | POA: Diagnosis not present

## 2021-02-06 DIAGNOSIS — Z79899 Other long term (current) drug therapy: Secondary | ICD-10-CM

## 2021-02-06 DIAGNOSIS — Z933 Colostomy status: Secondary | ICD-10-CM

## 2021-02-06 DIAGNOSIS — G9341 Metabolic encephalopathy: Secondary | ICD-10-CM

## 2021-02-06 DIAGNOSIS — Z794 Long term (current) use of insulin: Secondary | ICD-10-CM

## 2021-02-06 DIAGNOSIS — Z1624 Resistance to multiple antibiotics: Secondary | ICD-10-CM | POA: Diagnosis present

## 2021-02-06 DIAGNOSIS — E160 Drug-induced hypoglycemia without coma: Secondary | ICD-10-CM

## 2021-02-06 DIAGNOSIS — R4182 Altered mental status, unspecified: Secondary | ICD-10-CM

## 2021-02-06 DIAGNOSIS — J449 Chronic obstructive pulmonary disease, unspecified: Secondary | ICD-10-CM | POA: Diagnosis not present

## 2021-02-06 DIAGNOSIS — Z881 Allergy status to other antibiotic agents status: Secondary | ICD-10-CM

## 2021-02-06 DIAGNOSIS — E43 Unspecified severe protein-calorie malnutrition: Secondary | ICD-10-CM | POA: Diagnosis present

## 2021-02-06 DIAGNOSIS — N492 Inflammatory disorders of scrotum: Secondary | ICD-10-CM | POA: Diagnosis not present

## 2021-02-06 DIAGNOSIS — N5089 Other specified disorders of the male genital organs: Secondary | ICD-10-CM

## 2021-02-06 DIAGNOSIS — L89154 Pressure ulcer of sacral region, stage 4: Secondary | ICD-10-CM | POA: Diagnosis present

## 2021-02-06 DIAGNOSIS — Z993 Dependence on wheelchair: Secondary | ICD-10-CM

## 2021-02-06 DIAGNOSIS — L89309 Pressure ulcer of unspecified buttock, unspecified stage: Secondary | ICD-10-CM | POA: Diagnosis not present

## 2021-02-06 DIAGNOSIS — A4181 Sepsis due to Enterococcus: Secondary | ICD-10-CM | POA: Diagnosis not present

## 2021-02-06 DIAGNOSIS — I2510 Atherosclerotic heart disease of native coronary artery without angina pectoris: Secondary | ICD-10-CM | POA: Diagnosis present

## 2021-02-06 DIAGNOSIS — Z681 Body mass index (BMI) 19 or less, adult: Secondary | ICD-10-CM

## 2021-02-06 DIAGNOSIS — I130 Hypertensive heart and chronic kidney disease with heart failure and stage 1 through stage 4 chronic kidney disease, or unspecified chronic kidney disease: Secondary | ICD-10-CM | POA: Diagnosis present

## 2021-02-06 DIAGNOSIS — Z7401 Bed confinement status: Secondary | ICD-10-CM | POA: Diagnosis not present

## 2021-02-06 DIAGNOSIS — R64 Cachexia: Secondary | ICD-10-CM | POA: Diagnosis present

## 2021-02-06 DIAGNOSIS — L89324 Pressure ulcer of left buttock, stage 4: Secondary | ICD-10-CM | POA: Diagnosis present

## 2021-02-06 DIAGNOSIS — Z66 Do not resuscitate: Secondary | ICD-10-CM | POA: Diagnosis present

## 2021-02-06 DIAGNOSIS — D649 Anemia, unspecified: Secondary | ICD-10-CM | POA: Diagnosis not present

## 2021-02-06 DIAGNOSIS — Z7189 Other specified counseling: Secondary | ICD-10-CM | POA: Diagnosis not present

## 2021-02-06 DIAGNOSIS — I739 Peripheral vascular disease, unspecified: Secondary | ICD-10-CM | POA: Diagnosis not present

## 2021-02-06 DIAGNOSIS — F17210 Nicotine dependence, cigarettes, uncomplicated: Secondary | ICD-10-CM | POA: Diagnosis present

## 2021-02-06 DIAGNOSIS — L89314 Pressure ulcer of right buttock, stage 4: Secondary | ICD-10-CM | POA: Diagnosis present

## 2021-02-06 DIAGNOSIS — B9620 Unspecified Escherichia coli [E. coli] as the cause of diseases classified elsewhere: Secondary | ICD-10-CM | POA: Diagnosis not present

## 2021-02-06 DIAGNOSIS — Z89611 Acquired absence of right leg above knee: Secondary | ICD-10-CM | POA: Diagnosis not present

## 2021-02-06 DIAGNOSIS — E11649 Type 2 diabetes mellitus with hypoglycemia without coma: Secondary | ICD-10-CM | POA: Diagnosis present

## 2021-02-06 DIAGNOSIS — Z1621 Resistance to vancomycin: Secondary | ICD-10-CM | POA: Diagnosis present

## 2021-02-06 DIAGNOSIS — L8944 Pressure ulcer of contiguous site of back, buttock and hip, stage 4: Secondary | ICD-10-CM | POA: Diagnosis not present

## 2021-02-06 DIAGNOSIS — Z8616 Personal history of COVID-19: Secondary | ICD-10-CM | POA: Diagnosis not present

## 2021-02-06 DIAGNOSIS — B952 Enterococcus as the cause of diseases classified elsewhere: Secondary | ICD-10-CM | POA: Diagnosis not present

## 2021-02-06 DIAGNOSIS — E162 Hypoglycemia, unspecified: Secondary | ICD-10-CM

## 2021-02-06 DIAGNOSIS — M8668 Other chronic osteomyelitis, other site: Secondary | ICD-10-CM

## 2021-02-06 DIAGNOSIS — E1169 Type 2 diabetes mellitus with other specified complication: Secondary | ICD-10-CM | POA: Diagnosis present

## 2021-02-06 DIAGNOSIS — Z89619 Acquired absence of unspecified leg above knee: Secondary | ICD-10-CM

## 2021-02-06 DIAGNOSIS — A498 Other bacterial infections of unspecified site: Secondary | ICD-10-CM | POA: Diagnosis not present

## 2021-02-06 DIAGNOSIS — Z515 Encounter for palliative care: Secondary | ICD-10-CM | POA: Diagnosis not present

## 2021-02-06 DIAGNOSIS — Z7984 Long term (current) use of oral hypoglycemic drugs: Secondary | ICD-10-CM

## 2021-02-06 DIAGNOSIS — I5032 Chronic diastolic (congestive) heart failure: Secondary | ICD-10-CM | POA: Diagnosis present

## 2021-02-06 DIAGNOSIS — E860 Dehydration: Secondary | ICD-10-CM | POA: Diagnosis present

## 2021-02-06 DIAGNOSIS — N189 Chronic kidney disease, unspecified: Secondary | ICD-10-CM | POA: Diagnosis present

## 2021-02-06 DIAGNOSIS — R7881 Bacteremia: Secondary | ICD-10-CM | POA: Diagnosis not present

## 2021-02-06 DIAGNOSIS — Z1612 Extended spectrum beta lactamase (ESBL) resistance: Secondary | ICD-10-CM | POA: Diagnosis not present

## 2021-02-06 DIAGNOSIS — A419 Sepsis, unspecified organism: Secondary | ICD-10-CM

## 2021-02-06 DIAGNOSIS — E119 Type 2 diabetes mellitus without complications: Secondary | ICD-10-CM | POA: Diagnosis not present

## 2021-02-06 DIAGNOSIS — R7989 Other specified abnormal findings of blood chemistry: Secondary | ICD-10-CM

## 2021-02-06 LAB — CBC WITH DIFFERENTIAL/PLATELET
Abs Immature Granulocytes: 0.11 10*3/uL — ABNORMAL HIGH (ref 0.00–0.07)
Basophils Absolute: 0 10*3/uL (ref 0.0–0.1)
Basophils Relative: 0 %
Eosinophils Absolute: 0 10*3/uL (ref 0.0–0.5)
Eosinophils Relative: 0 %
HCT: 24.4 % — ABNORMAL LOW (ref 39.0–52.0)
Hemoglobin: 7.8 g/dL — ABNORMAL LOW (ref 13.0–17.0)
Immature Granulocytes: 1 %
Lymphocytes Relative: 11 %
Lymphs Abs: 1.6 10*3/uL (ref 0.7–4.0)
MCH: 27 pg (ref 26.0–34.0)
MCHC: 32 g/dL (ref 30.0–36.0)
MCV: 84.4 fL (ref 80.0–100.0)
Monocytes Absolute: 0.6 10*3/uL (ref 0.1–1.0)
Monocytes Relative: 4 %
Neutro Abs: 12.4 10*3/uL — ABNORMAL HIGH (ref 1.7–7.7)
Neutrophils Relative %: 84 %
Platelets: 272 10*3/uL (ref 150–400)
RBC: 2.89 MIL/uL — ABNORMAL LOW (ref 4.22–5.81)
RDW: 21.1 % — ABNORMAL HIGH (ref 11.5–15.5)
Smear Review: NORMAL
WBC: 14.9 10*3/uL — ABNORMAL HIGH (ref 4.0–10.5)
nRBC: 0 % (ref 0.0–0.2)

## 2021-02-06 LAB — COMPREHENSIVE METABOLIC PANEL
ALT: 79 U/L — ABNORMAL HIGH (ref 0–44)
AST: 131 U/L — ABNORMAL HIGH (ref 15–41)
Albumin: 1.5 g/dL — ABNORMAL LOW (ref 3.5–5.0)
Alkaline Phosphatase: 144 U/L — ABNORMAL HIGH (ref 38–126)
Anion gap: 6 (ref 5–15)
BUN: 41 mg/dL — ABNORMAL HIGH (ref 8–23)
CO2: 22 mmol/L (ref 22–32)
Calcium: 8 mg/dL — ABNORMAL LOW (ref 8.9–10.3)
Chloride: 110 mmol/L (ref 98–111)
Creatinine, Ser: 1.52 mg/dL — ABNORMAL HIGH (ref 0.61–1.24)
GFR, Estimated: 47 mL/min — ABNORMAL LOW (ref >60)
Glucose, Bld: 72 mg/dL (ref 70–99)
Potassium: 4.4 mmol/L (ref 3.5–5.1)
Sodium: 138 mmol/L (ref 135–145)
Total Bilirubin: 0.6 mg/dL (ref 0.3–1.2)
Total Protein: 5.5 g/dL — ABNORMAL LOW (ref 6.5–8.1)

## 2021-02-06 LAB — LACTIC ACID, PLASMA: Lactic Acid, Venous: 1.8 mmol/L (ref 0.5–1.9)

## 2021-02-06 LAB — CBG MONITORING, ED
Glucose-Capillary: 207 mg/dL — ABNORMAL HIGH (ref 70–99)
Glucose-Capillary: 125 mg/dL — ABNORMAL HIGH (ref 70–99)
Glucose-Capillary: 152 mg/dL — ABNORMAL HIGH (ref 70–99)
Glucose-Capillary: 84 mg/dL (ref 70–99)

## 2021-02-06 LAB — TROPONIN I (HIGH SENSITIVITY)
Troponin I (High Sensitivity): 19 ng/L — ABNORMAL HIGH (ref <18)
Troponin I (High Sensitivity): 31 ng/L — ABNORMAL HIGH (ref <18)

## 2021-02-06 LAB — GLUCOSE, CAPILLARY: Glucose-Capillary: 97 mg/dL (ref 70–99)

## 2021-02-06 MED ORDER — VANCOMYCIN HCL IN DEXTROSE 1-5 GM/200ML-% IV SOLN
1000.0000 mg | Freq: Once | INTRAVENOUS | Status: AC
  Administered 2021-02-06: 1000 mg via INTRAVENOUS
  Filled 2021-02-06: qty 200

## 2021-02-06 MED ORDER — ACETAMINOPHEN 650 MG RE SUPP: 650.0000 mg | Freq: Four times a day (QID) | RECTAL | Status: DC | PRN

## 2021-02-06 MED ORDER — SODIUM CHLORIDE 0.9 % IV SOLN: Freq: Once | INTRAVENOUS | Status: AC

## 2021-02-06 MED ORDER — SODIUM CHLORIDE 0.9 % IV BOLUS
1000.0000 mL | Freq: Once | INTRAVENOUS | Status: AC
  Administered 2021-02-06: 1000 mL via INTRAVENOUS

## 2021-02-06 MED ORDER — LACTATED RINGERS IV BOLUS (SEPSIS)
1000.0000 mL | Freq: Once | INTRAVENOUS | Status: AC
  Administered 2021-02-06: 1000 mL via INTRAVENOUS

## 2021-02-06 MED ORDER — ACETAMINOPHEN 325 MG PO TABS: 650.0000 mg | ORAL_TABLET | Freq: Four times a day (QID) | ORAL | Status: DC | PRN

## 2021-02-06 MED ORDER — VANCOMYCIN HCL 1250 MG/250ML IV SOLN: 1250.0000 mg | INTRAVENOUS | Status: DC

## 2021-02-06 MED ORDER — SODIUM CHLORIDE 0.9 % IV SOLN
2.0000 g | Freq: Two times a day (BID) | INTRAVENOUS | Status: DC
  Administered 2021-02-07: 2 g via INTRAVENOUS
  Filled 2021-02-06 (×2): qty 2

## 2021-02-06 MED ORDER — MORPHINE SULFATE (PF) 2 MG/ML IV SOLN
2.0000 mg | INTRAVENOUS | Status: DC | PRN
  Administered 2021-02-07: 2 mg via INTRAVENOUS
  Filled 2021-02-06: qty 1

## 2021-02-06 MED ORDER — LACTATED RINGERS IV BOLUS (SEPSIS)
250.0000 mL | Freq: Once | INTRAVENOUS | Status: AC
  Administered 2021-02-07: 250 mL via INTRAVENOUS

## 2021-02-06 MED ORDER — SODIUM CHLORIDE 0.9 % IV SOLN
2.0000 g | Freq: Once | INTRAVENOUS | Status: AC
  Administered 2021-02-06: 2 g via INTRAVENOUS
  Filled 2021-02-06: qty 2

## 2021-02-06 MED ORDER — METRONIDAZOLE IN NACL 5-0.79 MG/ML-% IV SOLN
500.0000 mg | Freq: Once | INTRAVENOUS | Status: AC
  Administered 2021-02-06: 500 mg via INTRAVENOUS
  Filled 2021-02-06: qty 100

## 2021-02-06 MED ORDER — ONDANSETRON HCL 4 MG/2ML IJ SOLN: 4.0000 mg | Freq: Four times a day (QID) | INTRAMUSCULAR | Status: DC | PRN

## 2021-02-06 MED ORDER — ONDANSETRON HCL 4 MG PO TABS: 4.0000 mg | ORAL_TABLET | Freq: Four times a day (QID) | ORAL | Status: DC | PRN

## 2021-02-06 MED ORDER — LACTATED RINGERS IV SOLN: INTRAVENOUS | Status: DC

## 2021-02-06 MED ORDER — LACTATED RINGERS IV BOLUS (SEPSIS)
500.0000 mL | Freq: Once | INTRAVENOUS | Status: AC
  Administered 2021-02-07: 500 mL via INTRAVENOUS

## 2021-02-06 MED ORDER — SODIUM CHLORIDE 0.9 % IV BOLUS
500.0000 mL | Freq: Once | INTRAVENOUS | Status: AC
  Administered 2021-02-06: 500 mL via INTRAVENOUS

## 2021-02-06 MED ORDER — ENOXAPARIN SODIUM 40 MG/0.4ML ~~LOC~~ SOLN
40.0000 mg | SUBCUTANEOUS | Status: DC
  Administered 2021-02-07 – 2021-02-12 (×7): 40 mg via SUBCUTANEOUS
  Filled 2021-02-06 (×7): qty 0.4

## 2021-02-06 NOTE — Congregational Nurse Program (Signed)
PHARMACY -  BRIEF ANTIBIOTIC NOTE   Pharmacy has received consult(s) for unknown source from an ED provider.  The patient's profile has been reviewed for ht/wt/allergies/indication/available labs.    One time order(s) placed for vancomycin and cefepime  Further antibiotics/pharmacy consults should be ordered by admitting physician if indicated.                       Thank you, Ronnald Ramp 02/06/2021  8:23 PM

## 2021-02-06 NOTE — ED Notes (Signed)
Pt more awake, eyes open, follows command to open mouth for temp.

## 2021-02-06 NOTE — ED Notes (Signed)
Colostomy bag emptied.

## 2021-02-06 NOTE — ED Notes (Signed)
fsbs 84   md aware.

## 2021-02-06 NOTE — ED Notes (Signed)
fsbs 152  Pt awake  Sinus on monitor.

## 2021-02-06 NOTE — ED Notes (Signed)
md in with pt  fsbs 125  Iv fluids infusing.  Pt awake.

## 2021-02-06 NOTE — H&P (Signed)
History and Physical    WILLETT LEFEBER NKN:[id number] DOB: 1946/02/06 DOA: 02/06/2021  PCP: Garwin Brothers, MD   Patient coming from: [place] have personally briefly reviewed patient's old medical records in [place]  Chief Complaint: Unresponsiveness, hypoglycemia  HPI: Blake Villarreal is a 75 y.o. 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  Bystander CPR and EMS activated.  EMS found blood sugar of 24 but no IV access on arrival ED Course: On arrival, afebrile at 97.8, pulse 73, BP 95/43, respirations 23 with O2 sat 88% on room air.  Patient had placement of IO in the ER and D50 was administered.  Blood work significant for WBC 15,000, hemoglobin 7.8 which is baseline, lactic acid normal at 1.8.  Creatinine 1.52, up from 1.0 at discharge 9 days ago.  Elevated LFTs with AST/ALT 131/79 and alk phos 144.  Troponin 19>34. EKG ; pending Imaging: Chest x-ray:Mild left basilar linear scarring without evidence of acute or active cardiopulmonary disease. CT abdomen and pelvis(ordered due to scrotal ulcer seen on exam): Large sacral and bilateral ischial decubitus ulcers. Chronic osteomyelitis of sacrum, coccyx and ischial tuberosities No evidence of necrotizing infection of scrotum  Patient was started on broad-spectrum antibiotics, IV fluid bolus. The ED provider spoke with family who requested the patient be placed INR  Hospitalist consulted for admission.   Review of Systems: As per HPI otherwise all other systems on review of systems negative.    Past Medical History:   Diagnosis Date  . Anemia   . CHF (congestive heart failure) ([place])   . CKD (chronic kidney disease)   . COPD (chronic obstructive pulmonary disease) ([place])   . Coronary artery disease   . Diabetes mellitus without complication ([place])   . Dyspnea   . Hx of AKA (above knee amputation), left ([place])   . Hypertension   . Intermittent confusion   . PAD (peripheral artery disease) ([place])   . Pneumonia   . Pressure ulcer, sacrum   . PTSD (post-traumatic stress disorder)     Past Surgical History:  Procedure Laterality Date  . ABDOMINAL SURGERY    . ABOVE KNEE LEG AMPUTATION    . LEG AMPUTATION ABOVE KNEE Right    PREVIOUS SURGERY     reports that he has quit smoking. His smoking use included cigarettes. He has quit using smokeless tobacco. He reports that he does not drink alcohol and does not use drugs.  Allergies  Allergen Reactions  . Daptomycin   . Iodinated Diagnostic Agents     Family History  Problem Relation Age of Onset  . CAD Neg Hx       Prior to Admission medications   Medication Sig Start Date End Date Taking? Authorizing Provider  acetaminophen (TYLENOL) 325 MG tablet Take 650 mg by mouth every 4 (four) hours as needed.   Yes [provider]  amLODipine (NORVASC) 10 MG tablet Take 10 mg by mouth daily.   Yes [provider]  Ascorbic Acid (VITAMIN C) 1000 MG tablet Take 1,000 mg by mouth daily.   Yes [provider]  atorvastatin (LIPITOR) 40 MG tablet Take 40  mg by mouth every evening.   Yes [provider]  carvedilol (COREG) 6.25 MG tablet Take 1 tablet (6.25 mg total) by mouth 2 (two) times daily with a meal. 01/28/21  Yes Lancaster, William C, MD  Cholecalciferol (VITAMIN D) 125 MCG (5000 UT) CAPS Take 1,000 Units by mouth daily.   Yes [provider]  collagenase (SANTYL) ointment Apply 1 application topically daily.   Yes [provider]  ferrous sulfate (FER-IN-SOL) 75 (15 Fe) MG/ML SOLN Take 4.3 mLs (65  mg of iron total) by mouth 3 (three) times daily with meals. 01/28/21 02/27/21 Yes Lancaster, William C, MD  folic acid (FOLVITE) 1 MG tablet Take 1 mg by mouth daily.   Yes [provider]  gabapentin (NEURONTIN) 100 MG capsule Take 100 mg by mouth 3 (three) times daily.   Yes [provider]  insulin aspart (NOVOLOG) 100 UNIT/ML injection Inject 0-10 Units into the skin 4 (four) times daily -  before meals and at bedtime. Inject per sliding scale 0-59 notify MD 60-150=0, 151-199=2 units; 200-249=4 units; 250-299=6 units; 300-349=8 units; 350-399=10 units 400-1000 notify MD   Yes [provider]  insulin glargine (LANTUS) 100 UNIT/ML injection Inject 10 Units into the skin at bedtime.   Yes [provider]  metFORMIN (GLUCOPHAGE) 500 MG tablet Take 500 mg by mouth daily with breakfast.   Yes [provider]  mirtazapine (REMERON) 7.5 MG tablet Take 7.5 mg by mouth at bedtime.   Yes [provider]  nystatin (MYCOSTATIN) 100000 UNIT/ML suspension Take 5 mLs by mouth 4 (four) times daily.   Yes [provider]  omeprazole (PRILOSEC) 20 MG capsule Take 20 mg by mouth 2 (two) times daily before a meal.   Yes [provider]  pantoprazole (PROTONIX) 40 MG tablet Take 1 tablet (40 mg total) by mouth 2 (two) times daily before a meal. 08/22/17  Yes Shah, Vipul, MD  predniSONE (DELTASONE) 10 MG tablet Take 4 tablets (40 mg total) by mouth daily for 3 days, THEN 3 tablets (30 mg total) daily for 3 days, THEN 2 tablets (20 mg total) daily for 3 days, THEN 1 tablet (10 mg total) daily for 3 days. 01/28/21 02/09/21 Yes Lancaster, William C, MD  sertraline (ZOLOFT) 50 MG tablet Take 50 mg by mouth daily.   Yes [provider]  tamsulosin (FLOMAX) 0.4 MG CAPS capsule Take 0.4 mg by mouth daily.   Yes [provider]  zinc sulfate (ZINC-220) 220 (50 Zn) MG capsule Take 220 mg by mouth daily.   Yes [provider]     Physical Exam: Vitals:   02/06/21 2115 02/06/21 2130 02/06/21 2145 02/06/21 2200  BP:  (!) 99/48  (!) 101/47  Pulse: 70 72 71 72  Resp: 20 (!) 28 13 14  Temp:      TempSrc:      SpO2: 100% 100% 100% 99%  Weight:      Height:         Vitals:   02/06/21 2115 02/06/21 2130 02/06/21 2145 02/06/21 2200  BP:  (!) 99/48  (!) 101/47  Pulse: 70 72 71 72  Resp: 20 (!) 28 13 14  Temp:      TempSrc:      SpO2: 100% 100% 100% 99%  Weight:      Height:          Constitutional:  Thin, frail-appearing drowsy but arousable and oriented x3  . Not in any apparent distress   HEENT:      Head: Normocephalic and atraumatic.         Eyes: PERLA, EOMI, Conjunctivae are normal. Sclera is non-icteric.       Mouth/Throat: Mucous membranes are moist.       Neck: Supple with no signs of meningismus. Cardiovascular: Regular rate and rhythm. No murmurs, gallops, or rubs. 2+ symmetrical distal pulses are present . No JVD. No LE edema Respiratory: Respiratory effort normal .Lungs sounds clear bilaterally. No wheezes, crackles, or rhonchi.  Gastrointestinal: Soft, non tender,, and non distended with positive bowel sounds.  Colostomy Genitourinary:  Erythema and excoriated skin scrotum Musculoskeletal: Nontender with normal range of motion in all extremities. No cyanosis, or erythema of extremities. Neurologic:  Face is symmetric. Moving all extremities. No gross focal neurologic deficits . Skin:  Sacral ulcer stage 4  psychiatric: Mood and affect are normal    Labs on Admission: I have personally reviewed following labs and imaging studies  CBC: Recent Labs  Lab 02/06/21 1754  WBC 14.9*  NEUTROABS 12.4*  HGB 7.8*  HCT 24.4*  MCV 84.4  PLT 272   Basic Metabolic Panel: Recent Labs  Lab 02/06/21 1754  NA 138  K 4.4  CL 110  CO2 22  GLUCOSE 72  BUN 41*  CREATININE 1.52*  CALCIUM 8.0*   GFR: Estimated Creatinine Clearance: 32.1 mL/min (A) (by C-G formula based on SCr of 1.52 mg/dL  (H)). Liver Function Tests: Recent Labs  Lab 02/06/21 1754  AST 131*  ALT 79*  ALKPHOS 144*  BILITOT 0.6  PROT 5.5*  ALBUMIN 1.5*   No results for input(s): LIPASE, AMYLASE in the last 168 hours. No results for input(s): AMMONIA in the last 168 hours. Coagulation Profile: No results for input(s): INR, PROTIME in the last 168 hours. Cardiac Enzymes: No results for input(s): CKTOTAL, CKMB, CKMBINDEX, TROPONINI in the last 168 hours. BNP (last 3 results) No results for input(s): PROBNP in the last 8760 hours. HbA1C: No results for input(s): HGBA1C in the last 72 hours. CBG: Recent Labs  Lab 02/06/21 1747 02/06/21 1833 02/06/21 1921 02/06/21 2215  GLUCAP 207* 152* 125* 84   Lipid Profile: No results for input(s): CHOL, HDL, LDLCALC, TRIG, CHOLHDL, LDLDIRECT in the last 72 hours. Thyroid Function Tests: No results for input(s): TSH, T4TOTAL, FREET4, T3FREE, THYROIDAB in the last 72 hours. Anemia Panel: No results for input(s): VITAMINB12, FOLATE, FERRITIN, TIBC, IRON, RETICCTPCT in the last 72 hours. Urine analysis:    Component Value Date/Time   COLORURINE YELLOW (A) 05/20/2020 1444   APPEARANCEUR CLEAR (A) 05/20/2020 1444   LABSPEC 1.012 05/20/2020 1444   PHURINE 5.0 05/20/2020 1444   GLUCOSEU NEGATIVE 05/20/2020 1444   HGBUR NEGATIVE 05/20/2020 1444   BILIRUBINUR NEGATIVE 05/20/2020 1444   KETONESUR NEGATIVE 05/20/2020 1444   PROTEINUR NEGATIVE 05/20/2020 1444   NITRITE NEGATIVE 05/20/2020 1444   LEUKOCYTESUR NEGATIVE 05/20/2020 1444    Radiological Exams on Admission: CT PELVIS WO CONTRAST  Result Date: 02/06/2021 CLINICAL DATA:  Scrotal swelling and ulceration. Evaluate for gangrene. EXAM: CT PELVIS WITHOUT CONTRAST TECHNIQUE: Multidetector CT imaging of the pelvis was performed following the standard protocol without intravenous contrast. COMPARISON:  CT abdomen pelvis dated May 30, 2017. FINDINGS: Urinary Tract: No abnormality visualized. Punctate  calcification in the visualized lower pole of the right kidney is likely vascular in etiology when compared to prior study. Bowel: Left lower quadrant loop colostomy. No bowel wall thickening, distention, or surrounding inflammatory changes. Vascular/Lymphatic: Mild interval increase in   size of the infrarenal abdominal aortic aneurysm measuring up to 3.2 cm, previously 3.0 cm. Extensive aortoiliac atherosclerotic calcification prior fem-fem bypass graft. No enlarged pelvic lymph nodes. Reproductive:  Prostate gland is unremarkable. Other:  None. Musculoskeletal: Large sacral and bilateral ischial decubitus ulcers extending to bone. Chronic smooth erosion of both ischial tuberosities. Increased sclerosis in the right ischial tuberosity. Chronic erosion of the lower sacrum and coccyx. No subcutaneous emphysema. IMPRESSION: 1. No evidence of necrotizing infection. 2. Large sacral and bilateral ischial decubitus ulcers extending to bone with chronic osteomyelitis of the sacrum, coccyx, and both ischial tuberosities. 3. Mild interval increase in size of the infrarenal abdominal aortic aneurysm measuring up to 3.2 cm, previously 3.0 cm. Recommend follow-up every 3 years. This recommendation follows ACR consensus guidelines: White Paper of the ACR Incidental Findings Committee II on Vascular Findings. J Am Coll Radiol 2013; 16:109-604. 4. Aortic Atherosclerosis (ICD10-I70.0). Electronically Signed   By: Titus Dubin M.D.   On: 02/06/2021 21:28   DG Chest Portable 1 View  Result Date: 02/06/2021 CLINICAL DATA:  Altered mental status.  Found unresponsive. EXAM: PORTABLE CHEST 1 VIEW COMPARISON:  May 23, 2020 FINDINGS: Mild, stable linear scarring is seen within the lateral aspect of the left lung base. Mild left-sided volume loss is noted. There is no evidence of acute infiltrate, pleural effusion or pneumothorax. The heart size and mediastinal contours are within normal limits. There is marked severity calcification  of the aortic arch. The visualized skeletal structures are unremarkable. IMPRESSION: Mild left basilar linear scarring without evidence of acute or active cardiopulmonary disease. Electronically Signed   By: Virgina Norfolk M.D.   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  Subsequently found to have blood sugar of 24, responding to D50 in the ER     Cellulitis  scrotum   Suspect sepsis, possible severe vs SARS related to hypoglycemia -Patient with AMS, hypoglycemia, hypotension, tachypneic, leukocytosis of 15,000 though lactic acid 1.8. -Cellulitis of scrotum on physical exam -CT abdomen and pelvis negative for necrotizing infection -Chest x-ray was nonacute.  Urinalysis pending -Continue IV vancomycin and cefepime    Unresponsiveness/Acute metabolic encephalopathy -Secondary to hypoglycemia possibly +/-sepsis -At baseline patient is awake and alert and was oriented following admissions -Fall and aspiration precautions -Treat hypoglycemia and sepsis  Hypoglycemia Diabetes type 2 on insulin -Blood sugar was 24, improving with 1 amp D50 -Possibly related to ongoing insulin treatment possibly decreased oral intake related as well as to suspected acute infection and suspected sepsis -CBG every 1 until stable then q2    Chronic anemia -Hemoglobin of 7.8 which is baseline  Elevated LFTs -AST 131, ALT 79 with alk phos 144 compared to being completely normal at discharge 9 days prior -Could all be related to acute infection -Continue to monitor      Decubitus ulcer of sacral region, stage 4 with diversion colostomy ([place])   Chronic osteomyelitis of sacrum ([facility])   Wheelchair bound   Severe protein-energy malnutrition ([place])   DNR (do not resuscitate) discussion -Patient is bedbound/wheelchair bound at baseline with chronic stage IV decubitus ulcers and chronic osteomyelitis in sacrum, coccyx and ischial tuberosities as seen  on CT -Patient also has poor nutritional status, poor mobility  -Wound care and TOC consult --Colostomy care -Family elected to place patient DNR per conversation with ED provider -Consider palliative care consult in the a.m.    S/P AKA (above knee amputation) ([facility])   PVD (peripheral vascular disease) ([place]) -Increase nursing assistance with transfers     COPD (chronic obstructive pulmonary disease) ([place]) -Not acutely exacerbated -Continue home inhalers with duo nebs as needed   DVT prophylaxis: Lovenox  Code Status: full code. Patient A&Ox3 some hours after admission and wants to be full code Family Communication:  none  Disposition Plan: Back to previous home environment Consults called: none  Status:At the time of admission, it appears that the appropriate admission status for this patient is INPATIENT. This is judged to be reasonable and necessary in order to provide the required intensity of service to ensure the patient's safety given the presenting symptoms, physical exam findings, and initial radiographic and laboratory data in the context of their  Comorbid conditions.   Patient requires inpatient status due to high intensity of service, high risk for further deterioration and high frequency of surveillance required.   I certify that at the point of admission it is my clinical judgment that the patient will require inpatient hospital care spanning beyond [place] MD [facility]     02/06/2021, 10:29 PM

## 2021-02-06 NOTE — ED Notes (Signed)
Unable to cath pt for urine  md aware.

## 2021-02-06 NOTE — ED Notes (Signed)
IO iv removed from left lower leg.  Pt tolerated well

## 2021-02-06 NOTE — ED Notes (Addendum)
Pt brought in via ems from [facility] with unresponsive.  Ems report fsbs 24 on scene.  Iv started stat on arrival and d50 given.  Pt has colostomy. Ems report call was for sob.  cig smoker and recent covid dx.  2 ivs started and fluids infusing.  nsr on monitor.

## 2021-02-06 NOTE — ED Triage Notes (Signed)
Pt brought in via ems from [facility] for unrepsonsiveness.   Per ems fsbs 24 on scene.  No iv access on arrival  md at bedside.  IO placed in lef lower leg by dr Roxan Hockey.  d50 given by dr Roxan Hockey stat thru io.

## 2021-02-06 NOTE — Consult Note (Signed)
Pharmacy Antibiotic Note  Blake Villarreal is a 75 y.o. male admitted on 02/06/2021 with cellulitis.  Pharmacy has been consulted for cefepime and vancomycin dosing.  Plan: Will start cefepime 2 g q12H.   Pt received vancomycin 1000 mg x 1 in the ED. Will order vancomycin 1250 mg q48H with a predicted AUC of 518. Goal AUC is 400-550. Plan to order level in the next 4-5 days. Scr used. 1.52. May need adjusting if Scr improves.   Height: 5\' 9"  (175.3 cm) Weight: 54 kg (119 lb) IBW/kg (Calculated) : 70.7  Temp (24hrs), Avg:97.9 F (36.6 C), Min:97.9 F (36.6 C), Max:97.9 F (36.6 C)  Recent Labs  Lab 02/06/21 1754 02/06/21 1756  WBC 14.9*  --   CREATININE 1.52*  --   LATICACIDVEN  --  1.8    Estimated Creatinine Clearance: 32.1 mL/min (A) (by C-G formula based on SCr of 1.52 mg/dL (H)).    Allergies  Allergen Reactions  . Daptomycin   . Iodinated Diagnostic Agents     Antimicrobials this admission: 2/22 cefepime >>  2/22 vancomycin >>   Dose adjustments this admission: None  Microbiology results: 2/22 BCx: pending  Thank you for allowing pharmacy to be a part of this patient's care.  3/22 02/06/2021 10:29 PM

## 2021-02-06 NOTE — ED Provider Notes (Signed)
[facility] Emergency Department Provider Note    Event Date/Time   First MD Initiated Contact with Patient 02/06/21 1747     (approximate)  I have reviewed the triage vital signs and the nursing notes.   HISTORY  Chief Complaint Unresponsive.    HPI Blake Villarreal is a 75 y.o. male presents to the ER for evaluation of unresponsiveness.  Coming from [facility] and reportedly had CPR performed with patient was found unresponsive.  Last seen normal several hours prior suspected around lunch time.  POA states that spoke with him last night was otherwise normal.  Patient was found unresponsive sitting in chair.  Bystander CPR was performed.  EMS found patient with pulse.  Spontaneous respirations.  Found to be hypoglycemic to 24.  Unable to obtain IV access was given IM glucagon.    Past Medical History:  Diagnosis Date  . Anemia   . CHF (congestive heart failure) (HCC)   . CKD (chronic kidney disease)   . COPD (chronic obstructive pulmonary disease) (HCC)   . Coronary artery disease   . Diabetes mellitus without complication (HCC)   . Dyspnea   . Hx of AKA (above knee amputation), left (HCC)   . Hypertension   . Intermittent confusion   . PAD (peripheral artery disease) (HCC)   . Pneumonia   . Pressure ulcer, sacrum   . PTSD (post-traumatic stress disorder)    Family History  Problem Relation Age of Onset  . CAD Neg Hx    Past Surgical History:  Procedure Laterality Date  . ABDOMINAL SURGERY    . ABOVE KNEE LEG AMPUTATION    . LEG AMPUTATION ABOVE KNEE Right    PREVIOUS SURGERY   Patient Active Problem List   Diagnosis Date Noted  . Acute metabolic encephalopathy 02/06/2021  . Hypoglycemia due to insulin 02/06/2021  . PVD (peripheral vascular disease) (HCC) 02/06/2021  . Chronic osteomyelitis of sacrum (HCC) 02/06/2021  . COPD (chronic obstructive pulmonary disease) (HCC) 02/06/2021  . Scrotal ulcer 02/06/2021  . Cellulitis of  scrotum 02/06/2021  . Cellulitis, scrotum 02/06/2021  . Wheelchair bound 02/06/2021  . Sepsis (HCC) 02/06/2021  . AKI (acute kidney injury) (HCC) 01/22/2021  . Acute on chronic respiratory failure with hypoxia (HCC) 01/22/2021  . Decubitus ulcer of sacral region, stage 4 (HCC) 01/22/2021  . Severe protein-energy malnutrition (HCC) 01/22/2021  . S/P AKA (above knee amputation) (HCC) 01/22/2021  . Hypoxia 01/22/2021  . Acute CHF (HCC) 05/20/2020  . Healthcare-associated pneumonia   . DNR (do not resuscitate) discussion   . Palliative care encounter   . Pressure injury of skin 08/16/2017  . Acute encephalopathy   . Chronic anemia 08/15/2017  . Acute respiratory failure (HCC)       Prior to Admission medications   Medication Sig Start Date End Date Taking? Authorizing Provider  acetaminophen (TYLENOL) 325 MG tablet Take 650 mg by mouth every 4 (four) hours as needed.   Yes [provider]  amLODipine (NORVASC) 10 MG tablet Take 10 mg by mouth daily.   Yes [provider]  Ascorbic Acid (VITAMIN C) 1000 MG tablet Take 1,000 mg by mouth daily.   Yes [provider]  atorvastatin (LIPITOR) 40 MG tablet Take 40 mg by mouth every evening.   Yes [provider]  carvedilol (COREG) 6.25 MG tablet Take 1 tablet (6.25 mg total) by mouth 2 (two) times daily with a meal. 01/28/21  Yes Azucena Fallen, MD  Cholecalciferol (VITAMIN D) 125 MCG (5000 UT) CAPS Take 1,000 Units by mouth daily.   Yes [provider]  collagenase (SANTYL) ointment Apply 1 application topically daily.   Yes [provider]  ferrous sulfate (FER-IN-SOL) 75 (15 Fe) MG/ML SOLN Take 4.3 mLs (65 mg of iron total) by mouth 3 (three) times daily with meals. 01/28/21 02/27/21 Yes Azucena Fallen, MD  folic acid (FOLVITE) 1 MG tablet Take 1 mg by mouth daily.   Yes [provider]  gabapentin (NEURONTIN) 100 MG capsule Take 100 mg by mouth 3 (three) times daily.    Yes [provider]  insulin aspart (NOVOLOG) 100 UNIT/ML injection Inject 0-10 Units into the skin 4 (four) times daily -  before meals and at bedtime. Inject per sliding scale 0-59 notify MD 60-150=0, 151-199=2 units; 200-249=4 units; 250-299=6 units; 300-349=8 units; 350-399=10 units [phone] notify MD   Yes [provider]  insulin glargine (LANTUS) 100 UNIT/ML injection Inject 10 Units into the skin at bedtime.   Yes [provider]  metFORMIN (GLUCOPHAGE) 500 MG tablet Take 500 mg by mouth daily with breakfast.   Yes [provider]  mirtazapine (REMERON) 7.5 MG tablet Take 7.5 mg by mouth at bedtime.   Yes [provider]  nystatin (MYCOSTATIN) 100000 UNIT/ML suspension Take 5 mLs by mouth 4 (four) times daily.   Yes [provider]  omeprazole (PRILOSEC) 20 MG capsule Take 20 mg by mouth 2 (two) times daily before a meal.   Yes [provider]  pantoprazole (PROTONIX) 40 MG tablet Take 1 tablet (40 mg total) by mouth 2 (two) times daily before a meal. 08/22/17  Yes Delfino Lovett, MD  predniSONE (DELTASONE) 10 MG tablet Take 4 tablets (40 mg total) by mouth daily for 3 days, THEN 3 tablets (30 mg total) daily for 3 days, THEN 2 tablets (20 mg total) daily for 3 days, THEN 1 tablet (10 mg total) daily for 3 days. 01/28/21 02/09/21 Yes Azucena Fallen, MD  sertraline (ZOLOFT) 50 MG tablet Take 50 mg by mouth daily.   Yes [provider]  tamsulosin (FLOMAX) 0.4 MG CAPS capsule Take 0.4 mg by mouth daily.   Yes [provider]  zinc sulfate (ZINC-220) 220 (50 Zn) MG capsule Take 220 mg by mouth daily.   Yes [provider]    Allergies Daptomycin and Iodinated diagnostic agents    Social History Social History   Tobacco Use  . Smoking status: Former Smoker    Types: Cigarettes  . Smokeless tobacco: Former Clinical biochemist  . Vaping Use: Never used  Substance Use Topics  . Alcohol use: Never     Comment: Drank heavily in the past per his cousin  . Drug use: Never    Review of Systems Patient denies headaches, rhinorrhea, blurry vision, numbness, shortness of breath, chest pain, edema, cough, abdominal pain, nausea, vomiting, diarrhea, dysuria, fevers, rashes or hallucinations unless otherwise stated above in HPI. ____________________________________________   PHYSICAL EXAM:  VITAL SIGNS: Vitals:   02/06/21 2215 02/06/21 2230  BP:  (!) 107/54  Pulse: 70 72  Resp: 20 13  Temp:    SpO2: 100% 100%    Constitutional: drowsy, cachectic. spontaneous resp, ill appearing Eyes: Conjunctivae are normal.  Head: Atraumatic. Nose: No congestion/rhinnorhea. Mouth/Throat: Mucous membranes are dry.   Neck: No stridor. Painless ROM.  Cardiovascular: Normal rate, regular rhythm. Grossly normal heart sounds.  Good peripheral circulation. Respiratory: Normal respiratory effort.  No retractions. Lungs CTAB. Gastrointestinal: Soft and nontender. No distention. No abdominal bruits. No CVA tenderness. Genitourinary: Ulcerations and erythema of the scrotum penis no crepitus.  Does have sacral ulcers Musculoskeletal: No lower extremity tenderness nor edema.  No joint effusions. Neurologic:  Unresponsive, gcs 3 Skin:  Skin is warm, dry.  Psychiatric: unable to assess ____________________________________________   LABS (all labs ordered are listed, but only abnormal results are displayed)  Results for orders placed or performed during the hospital encounter of 02/06/21 (from the past 24 hour(s))  CBG monitoring, ED     Status: Abnormal   Collection Time: 02/06/21  5:47 PM  Result Value Ref Range   Glucose-Capillary 207 (H) 70 - 99 mg/dL  CBC with Differential     Status: Abnormal   Collection Time: 02/06/21  5:54 PM  Result Value Ref Range   WBC 14.9 (H) 4.0 - 10.5 K/uL   RBC 2.89 (L) 4.22 - 5.81 MIL/uL   Hemoglobin 7.8 (L) 13.0 - 17.0 g/dL   HCT 98.1 (L) 19.1 - 47.8 %   MCV 84.4 80.0  - 100.0 fL   MCH 27.0 26.0 - 34.0 pg   MCHC 32.0 30.0 - 36.0 g/dL   RDW 29.5 (H) 62.1 - 30.8 %   Platelets 272 150 - 400 K/uL   nRBC 0.0 0.0 - 0.2 %   Neutrophils Relative % 84 %   Neutro Abs 12.4 (H) 1.7 - 7.7 K/uL   Lymphocytes Relative 11 %   Lymphs Abs 1.6 0.7 - 4.0 K/uL   Monocytes Relative 4 %   Monocytes Absolute 0.6 0.1 - 1.0 K/uL   Eosinophils Relative 0 %   Eosinophils Absolute 0.0 0.0 - 0.5 K/uL   Basophils Relative 0 %   Basophils Absolute 0.0 0.0 - 0.1 K/uL   WBC Morphology MORPHOLOGY UNREMARKABLE    RBC Morphology MORPHOLOGY UNREMARKABLE    Smear Review Normal platelet morphology    Immature Granulocytes 1 %   Abs Immature Granulocytes 0.11 (H) 0.00 - 0.07 K/uL  Comprehensive metabolic panel     Status: Abnormal   Collection Time: 02/06/21  5:54 PM  Result Value Ref Range   Sodium 138 135 - 145 mmol/L   Potassium 4.4 3.5 - 5.1 mmol/L   Chloride 110 98 - 111 mmol/L   CO2 22 22 - 32 mmol/L   Glucose, Bld 72 70 - 99 mg/dL   BUN 41 (H) 8 - 23 mg/dL   Creatinine, Ser 6.57 (H) 0.61 - 1.24 mg/dL   Calcium 8.0 (L) 8.9 - 10.3 mg/dL   Total Protein 5.5 (L) 6.5 - 8.1 g/dL   Albumin 1.5 (L) 3.5 - 5.0 g/dL   AST 846 (H) 15 - 41 U/L   ALT 79 (H) 0 - 44 U/L   Alkaline Phosphatase 144 (H) 38 - 126 U/L   Total Bilirubin 0.6 0.3 - 1.2 mg/dL   GFR, Estimated 47 (L) >60 mL/min   Anion gap 6 5 - 15  Lactic acid, plasma     Status: None   Collection Time: 02/06/21  5:56 PM  Result Value Ref Range   Lactic Acid, Venous 1.8 0.5 - 1.9 mmol/L  CBG monitoring, ED     Status: Abnormal   Collection Time: 02/06/21  6:33 PM  Result Value Ref Range   Glucose-Capillary 152 (H) 70 - 99 mg/dL  Troponin I (High Sensitivity)     Status: Abnormal   Collection Time: 02/06/21  6:48 PM  Result Value Ref  Range   Troponin I (High Sensitivity) 19 (H) <18 ng/L  CBG monitoring, ED     Status: Abnormal   Collection Time: 02/06/21  7:21 PM  Result Value Ref Range   Glucose-Capillary 125 (H) 70 -  99 mg/dL  Troponin I (High Sensitivity)     Status: Abnormal   Collection Time: 02/06/21  8:13 PM  Result Value Ref Range   Troponin I (High Sensitivity) 31 (H) <18 ng/L  CBG monitoring, ED     Status: None   Collection Time: 02/06/21 10:15 PM  Result Value Ref Range   Glucose-Capillary 84 70 - 99 mg/dL   ____________________________________________  EKG My review and personal interpretation at Time: 17:50   Indication: ams  Rate: 75  Rhythm: sinus Axis: normal Other: normal intervals, no stemi ____________________________________________  RADIOLOGY  I personally reviewed all radiographic images ordered to evaluate for the above acute complaints and reviewed radiology reports and findings.  These findings were personally discussed with the patient.  Please see medical record for radiology report.  ____________________________________________   PROCEDURES  Procedure(s) performed:  .Critical Care Performed by: Willy Eddyobinson, Devynne Sturdivant, MD Authorized by: Willy Eddyobinson, Dayanira Giovannetti, MD   Critical care provider statement:    Critical care time (minutes):  34   Critical care time was exclusive of:  Separately billable procedures and treating other patients   Critical care was necessary to treat or prevent imminent or life-threatening deterioration of the following conditions:  Endocrine crisis   Critical care was time spent personally by me on the following activities:  Development of treatment plan with patient or surrogate, discussions with consultants, evaluation of patient's response to treatment, examination of patient, obtaining history from patient or surrogate, ordering and performing treatments and interventions, ordering and review of laboratory studies, ordering and review of radiographic studies, pulse oximetry, re-evaluation of patient's condition and review of old charts      Critical Care performed: yes ____________________________________________   INITIAL IMPRESSION /  ASSESSMENT AND PLAN / ED COURSE  Pertinent labs & imaging results that were available during my care of the patient were reviewed by me and considered in my medical decision making (see chart for details).   DDX: Dehydration, sepsis, pna, uti, hypoglycemia, cva, drug effect, withdrawal, encephalitis  Blake Villarreal is a 75 y.o. who presents to the ED with presentation as described above.  Patient was given IM glucagon by EMS but still unresponsive.  He was given D50 after he obtained IO access and given IV fluids as he was hypotensive.  After receiving D50 blood sugar improved and patient's mentation did start to improve.  Discussed his presentation with POA states that he is a DNR but would agree to antibiotics IV fluids and IV medications would not want to be put on life support or CPR.  The patient will be placed on continuous pulse oximetry and telemetry for monitoring.  Laboratory evaluation will be sent to evaluate for the above complaints.     Clinical Course as of 02/06/21 2259  Tue Feb 06, 2021  [id number] Patient reassessed.  Now responding and talking.  Still hypotensive. [PR]  2013 Patient's mentation has significantly improved but still with soft blood pressures does appear cachectic was notified by nursing that return to perform catheterization they are having difficulty and on my examination patient has ulcerations of the scrotum and penis that I suspect are old I do not appreciate any crepitus but given his frailty I would not order CT imaging to make sure  he does not have signs of necrotizing soft tissue infection.  Although is not having other markers of sepsis will order broad-spectrum antibiotics.  His blood pressure is improving therefore suspect it was primarily related to significant dehydration. [PR]    Clinical Course User Index [PR] Willy Eddy, MD    The patient was evaluated in Emergency Department today for the symptoms described in the history of present illness.  He/she was evaluated in the context of the global COVID-19 pandemic, which necessitated consideration that the patient might be at risk for infection with the SARS-CoV-2 virus that causes COVID-19. Institutional protocols and algorithms that pertain to the evaluation of patients at risk for COVID-19 are in a state of rapid change based on information released by regulatory bodies including the CDC and federal and state organizations. These policies and algorithms were followed during the patient's care in the ED.  As part of my medical decision making, I reviewed the following data within the electronic MEDICAL RECORD NUMBER Nursing notes reviewed and incorporated, Labs reviewed, notes from prior ED visits and [place] Controlled Substance Database   ____________________________________________   FINAL CLINICAL IMPRESSION(S) / ED DIAGNOSES  Final diagnoses:  Hypoglycemia  Dehydration  Altered mental status, unspecified altered mental status type      NEW MEDICATIONS STARTED DURING THIS VISIT:  New Prescriptions   No medications on file     Note:  This document was prepared using Dragon voice recognition software and may include unintentional dictation errors.    Willy Eddy, MD 02/06/21 2259

## 2021-02-06 NOTE — ED Notes (Signed)
Blake Villarreal poa contacted and states she is legal guardian/poa.

## 2021-02-06 NOTE — ED Notes (Signed)
fsbs 207 after 1 amp d50 given by md

## 2021-02-07 ENCOUNTER — Other Ambulatory Visit: Payer: Self-pay

## 2021-02-07 DIAGNOSIS — J449 Chronic obstructive pulmonary disease, unspecified: Secondary | ICD-10-CM

## 2021-02-07 DIAGNOSIS — M8668 Other chronic osteomyelitis, other site: Secondary | ICD-10-CM

## 2021-02-07 DIAGNOSIS — L89309 Pressure ulcer of unspecified buttock, unspecified stage: Secondary | ICD-10-CM

## 2021-02-07 DIAGNOSIS — N492 Inflammatory disorders of scrotum: Secondary | ICD-10-CM

## 2021-02-07 DIAGNOSIS — Z881 Allergy status to other antibiotic agents status: Secondary | ICD-10-CM

## 2021-02-07 DIAGNOSIS — R7881 Bacteremia: Secondary | ICD-10-CM

## 2021-02-07 DIAGNOSIS — L89154 Pressure ulcer of sacral region, stage 4: Secondary | ICD-10-CM | POA: Diagnosis not present

## 2021-02-07 DIAGNOSIS — B952 Enterococcus as the cause of diseases classified elsewhere: Secondary | ICD-10-CM

## 2021-02-07 DIAGNOSIS — D649 Anemia, unspecified: Secondary | ICD-10-CM

## 2021-02-07 LAB — BLOOD CULTURE ID PANEL (REFLEXED) - BCID2
A.calcoaceticus-baumannii: NOT DETECTED
Bacteroides fragilis: NOT DETECTED
Candida albicans: NOT DETECTED
Candida auris: NOT DETECTED
Candida glabrata: NOT DETECTED
Candida krusei: NOT DETECTED
Candida parapsilosis: NOT DETECTED
Candida tropicalis: NOT DETECTED
Cryptococcus neoformans/gattii: NOT DETECTED
Enterobacter cloacae complex: NOT DETECTED
Enterobacterales: NOT DETECTED
Enterococcus Faecium: NOT DETECTED
Enterococcus faecalis: DETECTED — AB
Escherichia coli: NOT DETECTED
Haemophilus influenzae: NOT DETECTED
Klebsiella aerogenes: NOT DETECTED
Klebsiella oxytoca: NOT DETECTED
Klebsiella pneumoniae: NOT DETECTED
Listeria monocytogenes: NOT DETECTED
Neisseria meningitidis: NOT DETECTED
Proteus species: NOT DETECTED
Pseudomonas aeruginosa: NOT DETECTED
Salmonella species: NOT DETECTED
Serratia marcescens: NOT DETECTED
Staphylococcus aureus (BCID): NOT DETECTED
Staphylococcus epidermidis: NOT DETECTED
Staphylococcus lugdunensis: NOT DETECTED
Staphylococcus species: NOT DETECTED
Stenotrophomonas maltophilia: NOT DETECTED
Streptococcus agalactiae: NOT DETECTED
Streptococcus pneumoniae: NOT DETECTED
Streptococcus pyogenes: NOT DETECTED
Streptococcus species: NOT DETECTED
Vancomycin resistance: DETECTED — AB

## 2021-02-07 LAB — BASIC METABOLIC PANEL
Anion gap: 7 (ref 5–15)
BUN: 35 mg/dL — ABNORMAL HIGH (ref 8–23)
CO2: 19 mmol/L — ABNORMAL LOW (ref 22–32)
Calcium: 7.3 mg/dL — ABNORMAL LOW (ref 8.9–10.3)
Chloride: 113 mmol/L — ABNORMAL HIGH (ref 98–111)
Creatinine, Ser: 1.01 mg/dL (ref 0.61–1.24)
GFR, Estimated: >60 mL/min (ref >60)
Glucose, Bld: 77 mg/dL (ref 70–99)
Potassium: 3.9 mmol/L (ref 3.5–5.1)
Sodium: 139 mmol/L (ref 135–145)

## 2021-02-07 LAB — GLUCOSE, CAPILLARY
Glucose-Capillary: 128 mg/dL — ABNORMAL HIGH (ref 70–99)
Glucose-Capillary: 124 mg/dL — ABNORMAL HIGH (ref 70–99)
Glucose-Capillary: 64 mg/dL — ABNORMAL LOW (ref 70–99)
Glucose-Capillary: 137 mg/dL — ABNORMAL HIGH (ref 70–99)
Glucose-Capillary: 222 mg/dL — ABNORMAL HIGH (ref 70–99)
Glucose-Capillary: 214 mg/dL — ABNORMAL HIGH (ref 70–99)
Glucose-Capillary: 217 mg/dL — ABNORMAL HIGH (ref 70–99)

## 2021-02-07 LAB — CBC
HCT: 20.6 % — ABNORMAL LOW (ref 39.0–52.0)
Hemoglobin: 6.5 g/dL — ABNORMAL LOW (ref 13.0–17.0)
MCH: 26.6 pg (ref 26.0–34.0)
MCHC: 31.6 g/dL (ref 30.0–36.0)
MCV: 84.4 fL (ref 80.0–100.0)
Platelets: 223 10*3/uL (ref 150–400)
RBC: 2.44 MIL/uL — ABNORMAL LOW (ref 4.22–5.81)
RDW: 21.1 % — ABNORMAL HIGH (ref 11.5–15.5)
WBC: 11.4 10*3/uL — ABNORMAL HIGH (ref 4.0–10.5)
nRBC: 0 % (ref 0.0–0.2)

## 2021-02-07 MED ORDER — FERROUS SULFATE 325 (65 FE) MG PO TABS
325.0000 mg | ORAL_TABLET | Freq: Three times a day (TID) | ORAL | Status: DC
  Administered 2021-02-07 – 2021-02-13 (×18): 325 mg via ORAL
  Filled 2021-02-07 (×18): qty 1

## 2021-02-07 MED ORDER — ASCORBIC ACID 500 MG PO TABS
1000.0000 mg | ORAL_TABLET | Freq: Every day | ORAL | Status: DC
  Administered 2021-02-07 – 2021-02-13 (×7): 1000 mg via ORAL
  Filled 2021-02-07 (×6): qty 2

## 2021-02-07 MED ORDER — ACETAMINOPHEN 325 MG PO TABS: 650.0000 mg | ORAL_TABLET | Freq: Four times a day (QID) | ORAL | Status: DC | PRN

## 2021-02-07 MED ORDER — MORPHINE SULFATE (PF) 2 MG/ML IV SOLN
2.0000 mg | INTRAVENOUS | Status: DC | PRN
  Administered 2021-02-08 – 2021-02-09 (×5): 2 mg via INTRAVENOUS
  Filled 2021-02-07 (×5): qty 1

## 2021-02-07 MED ORDER — VANCOMYCIN HCL 750 MG/150ML IV SOLN
750.0000 mg | INTRAVENOUS | Status: DC
  Filled 2021-02-07: qty 150

## 2021-02-07 MED ORDER — MIRTAZAPINE 15 MG PO TABS: 7.5000 mg | ORAL_TABLET | Freq: Every day | ORAL | Status: DC

## 2021-02-07 MED ORDER — FOLIC ACID 1 MG PO TABS
1.0000 mg | ORAL_TABLET | Freq: Every day | ORAL | Status: DC
  Administered 2021-02-07 – 2021-02-13 (×7): 1 mg via ORAL
  Filled 2021-02-07 (×6): qty 1

## 2021-02-07 MED ORDER — NEPRO/CARBSTEADY PO LIQD
237.0000 mL | Freq: Three times a day (TID) | ORAL | Status: DC
  Administered 2021-02-07 – 2021-02-13 (×14): 237 mL via ORAL

## 2021-02-07 MED ORDER — SODIUM CHLORIDE 0.9 % IV SOLN
3.0000 g | Freq: Four times a day (QID) | INTRAVENOUS | Status: DC
  Administered 2021-02-07 (×2): 3 g via INTRAVENOUS
  Filled 2021-02-07: qty 3
  Filled 2021-02-07 (×2): qty 8
  Filled 2021-02-07: qty 3

## 2021-02-07 MED ORDER — ATORVASTATIN CALCIUM 20 MG PO TABS
40.0000 mg | ORAL_TABLET | Freq: Every evening | ORAL | Status: DC
  Administered 2021-02-07 – 2021-02-12 (×6): 40 mg via ORAL
  Filled 2021-02-07 (×6): qty 2

## 2021-02-07 MED ORDER — LINEZOLID 600 MG/300ML IV SOLN
600.0000 mg | Freq: Two times a day (BID) | INTRAVENOUS | Status: AC
  Administered 2021-02-07 – 2021-02-09 (×5): 600 mg via INTRAVENOUS
  Filled 2021-02-07 (×6): qty 300

## 2021-02-07 MED ORDER — GABAPENTIN 100 MG PO CAPS
100.0000 mg | ORAL_CAPSULE | Freq: Three times a day (TID) | ORAL | Status: DC
  Administered 2021-02-07 – 2021-02-13 (×18): 100 mg via ORAL
  Filled 2021-02-07 (×18): qty 1

## 2021-02-07 MED ORDER — TAMSULOSIN HCL 0.4 MG PO CAPS
0.4000 mg | ORAL_CAPSULE | Freq: Every day | ORAL | Status: DC
  Administered 2021-02-07 – 2021-02-13 (×7): 0.4 mg via ORAL
  Filled 2021-02-07 (×7): qty 1

## 2021-02-07 MED ORDER — ACETAMINOPHEN 650 MG RE SUPP: 650.0000 mg | Freq: Four times a day (QID) | RECTAL | Status: DC | PRN

## 2021-02-07 MED ORDER — SERTRALINE HCL 50 MG PO TABS
50.0000 mg | ORAL_TABLET | Freq: Every day | ORAL | Status: DC
  Administered 2021-02-07 – 2021-02-13 (×7): 50 mg via ORAL
  Filled 2021-02-07 (×7): qty 1

## 2021-02-07 MED ORDER — COLLAGENASE 250 UNIT/GM EX OINT
TOPICAL_OINTMENT | Freq: Two times a day (BID) | CUTANEOUS | Status: DC
  Filled 2021-02-07: qty 30

## 2021-02-07 MED ORDER — DAKINS (1/4 STRENGTH) 0.125 % EX SOLN
Freq: Two times a day (BID) | CUTANEOUS | Status: AC
  Filled 2021-02-07: qty 473

## 2021-02-07 MED ORDER — CARVEDILOL 6.25 MG PO TABS
6.2500 mg | ORAL_TABLET | Freq: Two times a day (BID) | ORAL | Status: DC
  Administered 2021-02-07 – 2021-02-13 (×12): 6.25 mg via ORAL
  Filled 2021-02-07 (×12): qty 1

## 2021-02-07 MED ORDER — ASCORBIC ACID 500 MG PO TABS: 500.0000 mg | ORAL_TABLET | Freq: Two times a day (BID) | ORAL | Status: DC

## 2021-02-07 MED ORDER — VITAMIN D 25 MCG (1000 UNIT) PO TABS
1000.0000 [IU] | ORAL_TABLET | Freq: Every day | ORAL | Status: DC
  Administered 2021-02-08 – 2021-02-13 (×6): 1000 [IU] via ORAL
  Filled 2021-02-07 (×6): qty 1

## 2021-02-07 MED ORDER — ADULT MULTIVITAMIN W/MINERALS CH
1.0000 | ORAL_TABLET | Freq: Every day | ORAL | Status: DC
  Administered 2021-02-08 – 2021-02-13 (×6): 1 via ORAL
  Filled 2021-02-07 (×6): qty 1

## 2021-02-07 NOTE — NC FL2 (Signed)
[place] MEDICAID FL2 LEVEL OF CARE SCREENING TOOL     IDENTIFICATION  Patient Name: Blake Villarreal Birthdate: 01-23-1946 Sex: male Admission Date (Current Location): 02/06/2021  [place] and [place] Number:  Chiropodist and Address:  [facility], [address] [place], [place] [address]      Provider Number: [id number]  Attending Physician Name and Address:  Enedina Finner, MD  Relative Name and Phone Number:       Current Level of Care: Hospital Recommended Level of Care: Skilled Nursing Facility Prior Approval Number:    Date Approved/Denied:   PASRR Number:    Discharge Plan: SNF    Current Diagnoses: Patient Active Problem List   Diagnosis Date Noted  . Acute metabolic encephalopathy 02/06/2021  . Hypoglycemia due to insulin 02/06/2021  . PVD (peripheral vascular disease) (HCC) 02/06/2021  . Chronic osteomyelitis of sacrum (HCC) 02/06/2021  . COPD (chronic obstructive pulmonary disease) (HCC) 02/06/2021  . Scrotal ulcer 02/06/2021  . Cellulitis of scrotum 02/06/2021  . Cellulitis, scrotum 02/06/2021  . Wheelchair bound 02/06/2021  . Sepsis (HCC) 02/06/2021  . Elevated LFTs 02/06/2021  . AKI (acute kidney injury) (HCC) 01/22/2021  . Acute on chronic respiratory failure with hypoxia (HCC) 01/22/2021  . Decubitus ulcer of sacral region, stage 4 (HCC) 01/22/2021  . Severe protein-energy malnutrition (HCC) 01/22/2021  . S/P AKA (above knee amputation) (HCC) 01/22/2021  . Hypoxia 01/22/2021  . Acute CHF (HCC) 05/20/2020  . Healthcare-associated pneumonia   . DNR (do not resuscitate) discussion   . Palliative care encounter   . Pressure injury of skin 08/16/2017  . Acute encephalopathy   . Chronic anemia 08/15/2017  . Acute respiratory failure (HCC)     Orientation RESPIRATION BLADDER Height & Weight     Self,Time,Place  Normal Incontinent Weight: 119 lb (54 kg) Height:  5\' 9"  (175.3 cm)  BEHAVIORAL SYMPTOMS/MOOD  NEUROLOGICAL BOWEL NUTRITION STATUS      Colostomy Diet (heart healthy, carb modified, thin liquids)  AMBULATORY STATUS COMMUNICATION OF NEEDS Skin   Extensive Assist Verbally PU Stage and Appropriate Care,Skin abrasions (open wound on scrotum)       PU Stage 4 Dressing:  (sacrum- guaze dressing.Ischial tuberosity--guaze dressing.)               Personal Care Assistance Level of Assistance  Bathing,Feeding,Dressing Bathing Assistance: Maximum assistance Feeding assistance: Independent Dressing Assistance: Maximum assistance     Functional Limitations Info  Sight,Hearing,Speech Sight Info: Adequate Hearing Info: Adequate Speech Info: Adequate    SPECIAL CARE FACTORS FREQUENCY  PT (By licensed PT),OT (By licensed OT)     PT Frequency: 5x OT Frequency: 5x            Contractures Contractures Info: Not present    Additional Factors Info  Code Status,Allergies Code Status Info: Full Code Allergies Info: Daptomycin, Iodinated Diagnostic Agents           Current Medications (02/07/2021):  This is the current hospital active medication list Current Facility-Administered Medications  Medication Dose Route Frequency Provider Last Rate Last Admin  . acetaminophen (TYLENOL) tablet 650 mg  650 mg Oral Q6H PRN 02/09/2021, MD       Or  . acetaminophen (TYLENOL) suppository 650 mg  650 mg Rectal Q6H PRN Enedina Finner, MD      . ceFEPIme (MAXIPIME) 2 g in sodium chloride 0.9 % 100 mL IVPB  2 g Intravenous Q12H Enedina Finner, RPH 200 mL/hr at 02/07/21 [phone]  2 g at 02/07/21 0802  . enoxaparin (LOVENOX) injection 40 mg  40 mg Subcutaneous Q24H Andris Baumann, MD   40 mg at 02/07/21 0013  . lactated ringers infusion   Intravenous Continuous Andris Baumann, MD 100 mL/hr at 02/07/21 0549 Infusion Verify at 02/07/21 0549  . morphine 2 MG/ML injection 2 mg  2 mg Intravenous Q2H PRN Andris Baumann, MD      . ondansetron [facility]) tablet 4 mg  4 mg Oral Q6H PRN Andris Baumann, MD        Or  . ondansetron [facility]) injection 4 mg  4 mg Intravenous Q6H PRN Andris Baumann, MD      . vancomycin (VANCOREADY) IVPB 750 mg/150 mL  750 mg Intravenous Q24H Enedina Finner, MD         Discharge Medications: Please see discharge summary for a list of discharge medications.  Relevant Imaging Results:  Relevant Lab Results:   Additional Information SSN:[id number]  Maree Krabbe, LCSW

## 2021-02-07 NOTE — Progress Notes (Signed)
Initial Nutrition Assessment  DOCUMENTATION CODES:   Severe malnutrition in context of chronic illness  INTERVENTION:   Nepro Shake po TID, each supplement provides 425 kcal and 19 grams protein  MVI daily   Vitamin C 500mg  po BID   NUTRITION DIAGNOSIS:   Severe Malnutrition related to chronic illness (COPD, chronic wounds) as evidenced by severe fat depletion,severe muscle depletion.  GOAL:   Patient will meet greater than or equal to 90% of their needs  MONITOR:   PO intake,Supplement acceptance,Labs,I & O's,Skin  REASON FOR ASSESSMENT:   Malnutrition Screening Tool    ASSESSMENT:   75 y.o. 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   RD familiar with this patient from a recent previous admit. Pt is a poor historian but reports poor appetite and oral intake at baseline. Pt does drink vanilla Ensure at home. RD will add supplements and vitamins to help pt meet his estimated needs and support wound healing. Pt is at high refeed risk. Per chart, pt is down 55lbs(32%) in 8 months; this is severe weight loss.   Medications reviewed and include: lovenox, unasyn, LRS @100ml /hr  Labs reviewed: BUN 35(H) WBC- 11.4(H), Hgb 6.5(L), Hct 20.6(L) cbgs- 128, 124, 64, 137, 222 x 24 hrs AIC 6.2(H)- 2/8  NUTRITION - FOCUSED PHYSICAL EXAM:  Flowsheet Row Most Recent Value  Orbital Region Severe depletion  Upper Arm Region Severe depletion  Thoracic and Lumbar Region Severe depletion  Buccal Region Severe depletion  Temple Region Severe depletion  Clavicle Bone Region Severe  depletion  Clavicle and Acromion Bone Region Severe depletion  Scapular Bone Region Severe depletion  Dorsal Hand Severe depletion  Patellar Region Severe depletion  Anterior Thigh Region Severe depletion  Posterior Calf Region Severe depletion  Edema (RD Assessment) None  Hair Reviewed  Eyes Reviewed  Mouth Reviewed  Skin Reviewed  Nails Reviewed     Diet Order:   Diet Order            Diet heart healthy/carb modified Room service appropriate? Yes; Fluid consistency: Thin  Diet effective now                EDUCATION NEEDS:   Education needs have been addressed  Skin:  Skin Assessment: Reviewed RN Assessment (sacrum:  8 cm x 6 cm x 5 cm, Left ischium:  8 cm x 4.5 cm x 3, Right ischium:  6 cm x 5 cm x 5 cm)   Last BM:  2/23- 4/8 via ostomy  Height:   Ht Readings from Last 1 Encounters:  02/06/21 5\' 9"  (1.753 m)    Weight:   Wt Readings from Last 1 Encounters:  02/06/21 54 kg    Ideal Body Weight:  64 kg (adjusted for L AKA)  BMI:  Body mass index is 17.57 kg/m.  Estimated Nutritional Needs:   Kcal:  1800-2100kcal/day  Protein:  90-105g/day  Fluid:  1.3-1.6L/day  02/08/21 MS, RD, LDN Please refer to [facility] for RD and/or RD on-call/weekend/after hours pager

## 2021-02-07 NOTE — Consult Note (Addendum)
WOC Nurse Consult Note: Asked to re-evaluate ischial/sacral and scrotal wounds.  CT scan today unremarkable, known chronic osteomyelitis  Reason for Consult: Re-check wounds.  Pictures obtained by MD to load into chart.  Wound type: Unstageable sacral and bilateral ischial wounds.  Scarring to scrotum, newly epithelialized with no return of melanin.   Wounds are mostly necrotic tissue, Santyl not effective at promoting debridement.  Albumin noted as 1.5.  Patient has consumed one Ensure today.   Will implement Dakins solution short term for debridement/ Pressure Injury POA: Yes Measurement: sacrum:  8 cm x 6 cm x 5 cm with 75% devitalized tissue to woundbed 25% nongranulating tissue Left ischium:  8 cm x 4.5 cm x 3 cm 100% devitalized stringy slough Right ischium:  6 cm x 5 cm x 5 cm 75% slough 25% nongranulating pink tissue Wound bed:see above Drainage (amount, consistency, odor) moderate serosanguinous  Necrotic odor Periwound: frequently moist   LLQ colostomy Dressing procedure/placement/frequency: Cleanse wounds to sacrum and bilateral ischial tuberosities with NS.  Apply Dakins moist gauze into wound bed. Cover with dry gauze and secure with ABD pads and tape. Change twice daily.  WOC Nurse ostomy consult note Stoma type/location: LLQ colostomy  Has 4" pouch in place.  Will implement 2 3/4" pouch.  Stomal assessment/size: 1 1/4"  " pink and moist Peristomal assessment: intact Treatment options for stomal/peristomal skin: barrier ring Output soft brown stool Ostomy pouching: 2pc. 2 3/4" pouch   Education provided: None Enrolled patient in [employer] DC program: NA  Will follow.  Maple Hudson MSN, RN, FNP-BC CWON Wound, Ostomy, Continence Nurse Pager [phone]

## 2021-02-07 NOTE — Consult Note (Addendum)
Pharmacy Antibiotic Note  Blake Villarreal is a 75 y.o. male admitted on 02/06/2021 with cellulitis.  Pharmacy has been consulted for ampicillin/sulbactam.  Patient with scrotal cellulitis with chronic sacral wounds and chronic osteomyelitis.  He has PMH of VRE wound infection following fem-fem bypass.  He has allergy listed to daptomycin, reaction unknown.  Plan:  Ampicillin/sulbactam 3gm IV q6h  Linezolid 600mg  IV q12h per ID  Height: 5\' 9"  (175.3 cm) Weight: 54 kg (119 lb) IBW/kg (Calculated) : 70.7  Temp (24hrs), Avg:98.2 F (36.8 C), Min:97.8 F (36.6 C), Max:98.8 F (37.1 C)  Recent Labs  Lab 02/06/21 1754 02/06/21 1756 02/07/21 0526  WBC 14.9*  --  11.4*  CREATININE 1.52*  --  1.01  LATICACIDVEN  --  1.8  --     Estimated Creatinine Clearance: 48.3 mL/min (by C-G formula based on SCr of 1.01 mg/dL).    Allergies  Allergen Reactions   Daptomycin    Iodinated Diagnostic Agents     Antimicrobials this admission: 2/22 cefepime >> 2/23 2/22 vancomycin >> 2/23 2/23 amp/sulb >> 2/23 linezolid >>  Dose adjustments this admission: None  Microbiology results: 2/22 BCx: 1/4 [facility], BCID = VRE  Thank you for allowing pharmacy to be a part of this patients care.  3/23, PharmD, BCPS.   Work Cell: [phone] 02/07/2021 2:30 PM

## 2021-02-07 NOTE — Consult Note (Signed)
Pharmacy Antibiotic Note  Blake Villarreal is a 75 y.o. male admitted on 02/06/2021 with cellulitis.  Pharmacy has been consulted for cefepime and vancomycin dosing.  Plan: Will start cefepime 2 g q12H.   Pt received vancomycin 1000 mg x 1 in the ED. Pt was ordered vancomycin 1250 mg q48H due to elevated Scr. Scr trending down. Vancomycin adjusted to 750 mg q24H with a predicted AUC of 433. Goal AUC is 400-550. Plan to order level in the next 4-5 days. Scr used. 1.01.   Height: 5\' 9"  (175.3 cm) Weight: 54 kg (119 lb) IBW/kg (Calculated) : 70.7  Temp (24hrs), Avg:98.3 F (36.8 C), Min:97.8 F (36.6 C), Max:98.8 F (37.1 C)  Recent Labs  Lab 02/06/21 1754 02/06/21 1756 02/07/21 0526  WBC 14.9*  --  11.4*  CREATININE 1.52*  --  1.01  LATICACIDVEN  --  1.8  --     Estimated Creatinine Clearance: 48.3 mL/min (by C-G formula based on SCr of 1.01 mg/dL).    Allergies  Allergen Reactions  . Daptomycin   . Iodinated Diagnostic Agents     Antimicrobials this admission: 2/22 cefepime >>  2/22 vancomycin >>   Dose adjustments this admission: None  Microbiology results: 2/22 BCx: pending  Thank you for allowing pharmacy to be a part of this patient's care.  3/22 02/07/2021 9:11 AM

## 2021-02-07 NOTE — Consult Note (Signed)
NAME: Blake Villarreal  DOB: [birth year]  MRN: [id number]  Date/Time: 02/07/2021 2:12 PM  REQUESTING PROVIDER: Dr.PAtel Subjective:  REASON FOR CONSULT: VRE bacteremia ?chart reviewed- patient is a limited historian Blake Villarreal is a 75 y.o. male with a history of COPD, CAD, severe peripheral vascular disease, right AKA, chronic sacral decubitus ulcer, CKD, chronic anemia, severe malnutrition, chronic lymphedema Was brought in from [facility] after being found unresponsive. As per EMS note Teacher, English as a foreign language were on scene first and when they arrived they found the [employer] [facility] staff performing CPR. They applied AED but no shock was advised. Patient had a pulse and spontaneous respiration. Blood sugar was 134. The vitals obtained by the staff were BP 90/36, heart rate 72, sats 95% axillary temperature of 96.4. As per EMS the call they  received was for shortness of breath and barely breathing. EMS found the patient's pupils were constricted 2 mm bilaterally. Staff were unable to advise when patient last got his medications. When EMS obtained vitals it was BP of 86/43, heart rate 100, respiratory 16 and sats of 100%. They administered 2 mg of intranasal Narcan as IV was unsuccessful. There was no change in patient's condition. Patient's blood sugar was rechecked and found to be 24 mg/dL. He was administered 1 mg of glucagon in the left deltoid.   Patient was [facility] to [facility]. In the ED BP 121/57, temperature 97.8, respiratory rate 77, sats 100%, respiratory rate 18. WBC 14.9, Hb 7.8, platelet 272 and creatinine 1.52. Blood cultures were sent. Patient was started on vancomycin cefepime and Flagyl. He was noted to have sacral decubitus unstageable. I am seeing the patient because the blood culture is positive for VRE.  Patient had presented to the ED couple of times in the past month with hypoglycemia and altered mental status The last admission was between 01/22/2021 until 01/28/2021  when he presented with shortness of breath and became confused and was admitted to the hospital as acute hypoxic respiratory failure in the setting of COPD exacerbation and post COVID-19 pneumonia.  I am seeing patient for VRE bacteremia Past Medical History:  Diagnosis Date  . Anemia   . CHF (congestive heart failure) (HCC)   . CKD (chronic kidney disease)   . COPD (chronic obstructive pulmonary disease) (HCC)   . Coronary artery disease   . Diabetes mellitus without complication (HCC)   . Dyspnea   . Hx of AKA (above knee amputation), left (HCC)   . Hypertension   . Intermittent confusion   . PAD (peripheral artery disease) (HCC)   . Pneumonia   . Pressure ulcer, sacrum   . PTSD (post-traumatic stress disorder)     Past Surgical History:  Procedure Laterality Date  . ABDOMINAL SURGERY    . ABOVE KNEE LEG AMPUTATION    . LEG AMPUTATION ABOVE KNEE Right    PREVIOUS SURGERY    Social History   Socioeconomic History  . Marital status: Single    Spouse name: Not on file  . Number of children: Not on file  . Years of education: Not on file  . Highest education level: Not on file  Occupational History  . Not on file  Tobacco Use  . Smoking status: Former Smoker    Types: Cigarettes  . Smokeless tobacco: Former Clinical biochemist  . Vaping Use: Never used  Substance and Sexual Activity  . Alcohol use: Never    Comment: Drank heavily in the past per his cousin  .  Drug use: Never  . Sexual activity: Not Currently  Other Topics Concern  . Not on file  Social History Narrative   ** Merged History Encounter **       Social Determinants of Health   Financial Resource Strain: Not on file  Food Insecurity: Not on file  Transportation Needs: Not on file  Physical Activity: Not on file  Stress: Not on file  Social Connections: Not on file  Intimate Partner Violence: Not on file    Family History  Problem Relation Age of Onset  . CAD Neg Hx    Allergies  Allergen  Reactions  . Daptomycin   . Iodinated Diagnostic Agents    I? Current Facility-Administered Medications  Medication Dose Route Frequency Provider Last Rate Last Admin  . acetaminophen (TYLENOL) tablet 650 mg  650 mg Oral Q6H PRN Enedina FinnerPatel, Sona, MD       Or  . acetaminophen (TYLENOL) suppository 650 mg  650 mg Rectal Q6H PRN Enedina FinnerPatel, Sona, MD      . ceFEPIme (MAXIPIME) 2 g in sodium chloride 0.9 % 100 mL IVPB  2 g Intravenous Q12H Ronnald Rampatel, Kishan S, [facility]lingRPH   Stopped at 02/07/21 [id number]  . enoxaparin (LOVENOX) injection 40 mg  40 mg Subcutaneous Q24H Andris Baumannuncan, Hazel V, MD   40 mg at 02/07/21 0013  . lactated ringers infusion   Intravenous Continuous Andris Baumannuncan, Hazel V, MD 100 mL/hr at 02/07/21 1321 Infusion Verify at 02/07/21 1321  . morphine 2 MG/ML injection 2 mg  2 mg Intravenous Q2H PRN Andris Baumannuncan, Hazel V, MD   2 mg at 02/07/21 1004  . ondansetron (ZOFRAN) tablet 4 mg  4 mg Oral Q6H PRN Andris Baumannuncan, Hazel V, MD       Or  . ondansetron [facility](ZOFRAN) injection 4 mg  4 mg Intravenous Q6H PRN Andris Baumannuncan, Hazel V, MD      . vancomycin (VANCOREADY) IVPB 750 mg/150 mL  750 mg Intravenous Q24H Enedina FinnerPatel, Sona, MD         Abtx:  Anti-infectives (From admission, onward)   Start     Dose/Rate Route Frequency Ordered Stop   02/08/21 1000  vancomycin (VANCOREADY) IVPB 1250 mg/250 mL  Status:  Discontinued        1,250 mg 166.7 mL/hr over 90 Minutes Intravenous Every 48 hours 02/06/21 2234 02/07/21 0905   02/07/21 2000  vancomycin (VANCOREADY) IVPB 750 mg/150 mL        750 mg 150 mL/hr over 60 Minutes Intravenous Every 24 hours 02/07/21 0905     02/07/21 0800  ceFEPIme (MAXIPIME) 2 g in sodium chloride 0.9 % 100 mL IVPB        2 g 200 mL/hr over 30 Minutes Intravenous Every 12 hours 02/06/21 2234     02/06/21 2015  ceFEPIme (MAXIPIME) 2 g in sodium chloride 0.9 % 100 mL IVPB        2 g 200 mL/hr over 30 Minutes Intravenous  Once 02/06/21 2013 02/06/21 2103   02/06/21 2015  metroNIDAZOLE (FLAGYL) IVPB 500 mg        500 mg 100  mL/hr over 60 Minutes Intravenous  Once 02/06/21 2013 02/06/21 2103   02/06/21 2015  vancomycin (VANCOCIN) IVPB 1000 mg/200 mL premix        1,000 mg 200 mL/hr over 60 Minutes Intravenous  Once 02/06/21 2013 02/06/21 2226      REVIEW OF SYSTEMS:  Poor historian Says he is in the bed or wheel chair Eats poorly Has a sacral  wound : Objective:  VITALS:  BP (!) 107/42 (BP Location: Left Arm)   Pulse 95   Temp 97.8 F (36.6 C)   Resp 20   Ht 5\' 9"  (1.753 m)   Wt 54 kg   SpO2 98%   BMI 17.57 kg/m  PHYSICAL EXAM:  General: awake, emaciated, answers to some questions- oriented in place and person Head: Normocephalic, without obvious abnormality, atraumatic. Eyes: Conjunctivae clear, anicteric sclerae. Pupils are equal ENT Nares normal. No drainage or sinus tenderness. Lips, mucosa, and tongue normal. No Thrush Neck: Supple, symmetrical, no adenopathy, thyroid: non tender no carotid bruit and no JVD. Back: stage Iv ischial decbitus Scrotum squished between his thighs Lungs: b/la ir entry Heart: Regular rate and rhythm, no murmur, rub or gallop.         Abdomen: Soft, non-tender,not distended. Colostomy  Extremities: rt AKA  Skin: No rashes or lesions. Or bruising Lymph: Cervical, supraclavicular normal. Neurologic: left lower flexion contraction Pertinent Labs Lab Results CBC    Component Value Date/Time   WBC 11.4 (H) 02/07/2021 0526   RBC 2.44 (L) 02/07/2021 0526   HGB 6.5 (L) 02/07/2021 0526   HCT 20.6 (L) 02/07/2021 0526   PLT 223 02/07/2021 0526   MCV 84.4 02/07/2021 0526   MCH 26.6 02/07/2021 0526   MCHC 31.6 02/07/2021 0526   RDW 21.1 (H) 02/07/2021 0526   LYMPHSABS 1.6 02/06/2021 1754   MONOABS 0.6 02/06/2021 1754   EOSABS 0.0 02/06/2021 1754   BASOSABS 0.0 02/06/2021 1754    CMP Latest Ref Rng & Units 02/07/2021 02/06/2021 01/28/2021  Glucose 70 - 99 mg/dL 77 72 01/30/2021)  BUN 8 - 23 mg/dL 025(K) 27(C) 62(B)  Creatinine 0.61 - 1.24 mg/dL 76(E  8.31) 5.17(O  Sodium 135 - 145 mmol/L 139 138 136  Potassium 3.5 - 5.1 mmol/L 3.9 4.4 3.9  Chloride 98 - 111 mmol/L 113(H) 110 108  CO2 22 - 32 mmol/L 19(L) 22 22  Calcium 8.9 - 10.3 mg/dL 7.3(L) 8.0(L) 8.0(L)  Total Protein 6.5 - 8.1 g/dL - 5.5(L) 5.4(L)  Total Bilirubin 0.3 - 1.2 mg/dL - 0.6 1.60)  Alkaline Phos 38 - 126 U/L - 144(H) 77  AST 15 - 41 U/L - 131(H) 36  ALT 0 - 44 U/L - 79(H) 43      Microbiology: Recent Results (from the past 240 hour(s))  Blood culture (routine x 2)     Status: None (Preliminary result)   Collection Time: 02/06/21  6:10 PM   Specimen: BLOOD  Result Value Ref Range Status   Specimen Description BLOOD BLOOD RIGHT HAND  Final   Special Requests   Final    BOTTLES DRAWN AEROBIC AND ANAEROBIC Blood Culture results may not be optimal due to an inadequate volume of blood received in culture bottles   Culture   Final    NO GROWTH < 24 HOURS Performed at [facility], [address], [place], [place]    Report Status PENDING  Incomplete  Blood culture (routine x 2)     Status: None (Preliminary result)   Collection Time: 02/06/21  8:12 PM   Specimen: BLOOD  Result Value Ref Range Status   Specimen Description BLOOD BLOOD RIGHT FOREARM  Final   Special Requests   Final    BOTTLES DRAWN AEROBIC AND ANAEROBIC Blood Culture adequate volume   Culture  Setup Time   Final    Organism ID to follow GRAM POSITIVE COCCI AEROBIC BOTTLE ONLY CRITICAL RESULT  CALLED TO, READ BACK BY AND VERIFIED WITH: MORGAN HICKS AT 1327 ON 02/07/21 SNG Performed at [facility], [address] [place], [place] [address]    Culture [facility] POSITIVE COCCI  Final   Report Status PENDING  Incomplete  Blood Culture ID Panel (Reflexed)     Status: Abnormal   Collection Time: 02/06/21  8:12 PM  Result Value Ref Range Status   Enterococcus faecalis DETECTED (A) NOT DETECTED Final    Comment: CRITICAL RESULT CALLED TO, READ BACK BY AND VERIFIED  WITH: MORGAN HICKS AT 1327 ON 02/07/21 SNG    Enterococcus Faecium NOT DETECTED NOT DETECTED Final   Listeria monocytogenes NOT DETECTED NOT DETECTED Final   Staphylococcus species NOT DETECTED NOT DETECTED Final   Staphylococcus aureus (BCID) NOT DETECTED NOT DETECTED Final   Staphylococcus epidermidis NOT DETECTED NOT DETECTED Final   Staphylococcus lugdunensis NOT DETECTED NOT DETECTED Final   Streptococcus species NOT DETECTED NOT DETECTED Final   Streptococcus agalactiae NOT DETECTED NOT DETECTED Final   Streptococcus pneumoniae NOT DETECTED NOT DETECTED Final   Streptococcus pyogenes NOT DETECTED NOT DETECTED Final   A.calcoaceticus-baumannii NOT DETECTED NOT DETECTED Final   Bacteroides fragilis NOT DETECTED NOT DETECTED Final   Enterobacterales NOT DETECTED NOT DETECTED Final   Enterobacter cloacae complex NOT DETECTED NOT DETECTED Final   Escherichia coli NOT DETECTED NOT DETECTED Final   Klebsiella aerogenes NOT DETECTED NOT DETECTED Final   Klebsiella oxytoca NOT DETECTED NOT DETECTED Final   Klebsiella pneumoniae NOT DETECTED NOT DETECTED Final   Proteus species NOT DETECTED NOT DETECTED Final   Salmonella species NOT DETECTED NOT DETECTED Final   Serratia marcescens NOT DETECTED NOT DETECTED Final   Haemophilus influenzae NOT DETECTED NOT DETECTED Final   Neisseria meningitidis NOT DETECTED NOT DETECTED Final   Pseudomonas aeruginosa NOT DETECTED NOT DETECTED Final   Stenotrophomonas maltophilia NOT DETECTED NOT DETECTED Final   Candida albicans NOT DETECTED NOT DETECTED Final   Candida auris NOT DETECTED NOT DETECTED Final   Candida glabrata NOT DETECTED NOT DETECTED Final   Candida krusei NOT DETECTED NOT DETECTED Final   Candida parapsilosis NOT DETECTED NOT DETECTED Final   Candida tropicalis NOT DETECTED NOT DETECTED Final   Cryptococcus neoformans/gattii NOT DETECTED NOT DETECTED Final   Vancomycin resistance DETECTED (A) NOT DETECTED Final    Comment: CRITICAL  RESULT CALLED TO, READ BACK BY AND VERIFIED WITH: MORGAN HICKS AT 1327 ON 02/07/21 [facility] Performed at [facility], [address] [place], [place] [address]     IMAGING RESULTS: I have personally reviewed the films ? Impression/Recommendation  VRE bacteremia source is from the sacral wound. There is a history of daptomycin allergy. Hence we will start him on linezolid amd change zosyn to unasyn ? 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 Dakins unayn linezolid  COPD  Anemia  Patient usually gets his care at [facility] need to get the records or transfer him to the hospital. ? ___________________________________________________ Discussed with care team Note:  This document was prepared using Dragon voice recognition software and may include unintentional dictation errors.

## 2021-02-07 NOTE — Progress Notes (Signed)
PHARMACY - PHYSICIAN COMMUNICATION CRITICAL VALUE ALERT - BLOOD CULTURE IDENTIFICATION (BCID)  Blake Villarreal is an 75 y.o. 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  He has chronic sacral wound and OM s/p diverting colostomy.  Blood culture from 2/22 1/4 [facility], BCID = VRE.  Allergy to daptomycin noted - unknown reaction.    Name of physician (or Provider) Contacted: Dr Rivka Safer  Current antibiotics: vancomycin and cefepime  Changes to prescribed antibiotics recommended:  ID changed to linezolid   Results for orders placed or performed during the hospital encounter of 02/06/21  Blood Culture ID Panel (Reflexed) (Collected: 02/06/2021  8:12 PM)  Result Value Ref Range   Enterococcus faecalis DETECTED (A) NOT DETECTED   Enterococcus Faecium NOT DETECTED NOT DETECTED   Listeria monocytogenes NOT DETECTED NOT DETECTED   Staphylococcus species NOT DETECTED NOT DETECTED   Staphylococcus aureus (BCID) NOT DETECTED NOT DETECTED   Staphylococcus epidermidis NOT DETECTED NOT DETECTED   Staphylococcus lugdunensis NOT DETECTED NOT DETECTED   Streptococcus species NOT DETECTED NOT DETECTED   Streptococcus agalactiae NOT DETECTED NOT DETECTED   Streptococcus pneumoniae NOT DETECTED NOT DETECTED   Streptococcus pyogenes NOT DETECTED NOT DETECTED   A.calcoaceticus-baumannii NOT DETECTED NOT DETECTED   Bacteroides fragilis NOT DETECTED NOT DETECTED   Enterobacterales NOT DETECTED NOT DETECTED   Enterobacter cloacae complex NOT DETECTED NOT DETECTED   Escherichia coli NOT DETECTED NOT DETECTED   Klebsiella aerogenes NOT DETECTED NOT DETECTED   Klebsiella oxytoca NOT DETECTED NOT DETECTED   Klebsiella pneumoniae NOT DETECTED NOT DETECTED   Proteus species NOT DETECTED NOT DETECTED   Salmonella species NOT DETECTED NOT DETECTED   Serratia marcescens NOT DETECTED NOT  DETECTED   Haemophilus influenzae NOT DETECTED NOT DETECTED   Neisseria meningitidis NOT DETECTED NOT DETECTED   Pseudomonas aeruginosa NOT DETECTED NOT DETECTED   Stenotrophomonas maltophilia NOT DETECTED NOT DETECTED   Candida albicans NOT DETECTED NOT DETECTED   Candida auris NOT DETECTED NOT DETECTED   Candida glabrata NOT DETECTED NOT DETECTED   Candida krusei NOT DETECTED NOT DETECTED   Candida parapsilosis NOT DETECTED NOT DETECTED   Candida tropicalis NOT DETECTED NOT DETECTED   Cryptococcus neoformans/gattii NOT DETECTED NOT DETECTED   Vancomycin resistance DETECTED (A) NOT DETECTED   Juliette Alcide, PharmD, BCPS.   Work Cell: [phone] 02/07/2021 2:22 PM

## 2021-02-07 NOTE — Progress Notes (Signed)
[facility]  - [place] at [facility]   PATIENT NAME: Blake Villarreal    MR#:  [id number]  DATE OF BIRTH:  [birth year]  SUBJECTIVE:  patient came in with acute onset confusion and lethargy at [facility]. He was found to have sugar of 24. Awake alert oriented to place in person. Able to eat. Patient has bedbound due to his severe decubitus.  REVIEW OF SYSTEMS:   Review of Systems  Constitutional: Negative for chills, fever and weight loss.  HENT: Negative for ear discharge, ear pain and nosebleeds.   Eyes: Negative for blurred vision, pain and discharge.  Respiratory: Negative for sputum production, shortness of breath, wheezing and stridor.   Cardiovascular: Negative for chest pain, palpitations, orthopnea and PND.  Gastrointestinal: Negative for abdominal pain, diarrhea, nausea and vomiting.  Genitourinary: Negative for frequency and urgency.  Musculoskeletal: Negative for back pain and joint pain.  Neurological: Positive for weakness. Negative for sensory change, speech change and focal weakness.  Psychiatric/Behavioral: Negative for depression and hallucinations. The patient is not nervous/anxious.    Tolerating Diet:yes Tolerating PT: bedbound  DRUG ALLERGIES:   Allergies  Allergen Reactions  . Daptomycin   . Iodinated Diagnostic Agents     VITALS:  Blood pressure (!) 107/42, pulse 95, temperature 97.8 F (36.6 C), resp. rate 20, height 5\' 9"  (1.753 m), weight 54 kg, SpO2 98 %.  PHYSICAL EXAMINATION:   Physical Exam  GENERAL:  75 y.o.-year-old patient lying in the bed with no acute distress. Chronically ill HEENT: Head atraumatic, normocephalic. Oropharynx and nasopharynx clear.  LUNGS: Normal breath sounds bilaterally, no wheezing, rales, rhonchi. No use of accessory muscles of respiration.  CARDIOVASCULAR: S1, S2 normal. No murmurs, rubs, or gallops.  ABDOMEN: Soft, nontender, nondistended. Bowel sounds present. No organomegaly or mass.   EXTREMITIES: AKA+ NEUROLOGIC: no focal weakness PSYCHIATRIC:  patient is alert and oriented x 2 SKIN:       LABORATORY PANEL:  CBC Recent Labs  Lab 02/07/21 0526  WBC 11.4*  HGB 6.5*  HCT 20.6*  PLT 223    Chemistries  Recent Labs  Lab 02/06/21 1754 02/07/21 0526  NA 138 139  K 4.4 3.9  CL 110 113*  CO2 22 19*  GLUCOSE 72 77  BUN 41* 35*  CREATININE 1.52* 1.01  CALCIUM 8.0* 7.3*  AST 131*  --   ALT 79*  --   ALKPHOS 144*  --   BILITOT 0.6  --    Cardiac Enzymes No results for input(s): TROPONINI in the last 168 hours. RADIOLOGY:  CT PELVIS WO CONTRAST  Result Date: 02/06/2021 CLINICAL DATA:  Scrotal swelling and ulceration. Evaluate for gangrene. EXAM: CT PELVIS WITHOUT CONTRAST TECHNIQUE: Multidetector CT imaging of the pelvis was performed following the standard protocol without intravenous contrast. COMPARISON:  CT abdomen pelvis dated May 30, 2017. FINDINGS: Urinary Tract: No abnormality visualized. Punctate calcification in the visualized lower pole of the right kidney is likely vascular in etiology when compared to prior study. Bowel: Left lower quadrant loop colostomy. No bowel wall thickening, distention, or surrounding inflammatory changes. Vascular/Lymphatic: Mild interval increase in size of the infrarenal abdominal aortic aneurysm measuring up to 3.2 cm, previously 3.0 cm. Extensive aortoiliac atherosclerotic calcification prior fem-fem bypass graft. No enlarged pelvic lymph nodes. Reproductive:  Prostate gland is unremarkable. Other:  None. Musculoskeletal: Large sacral and bilateral ischial decubitus ulcers extending to bone. Chronic smooth erosion of both ischial tuberosities. Increased sclerosis in the right ischial tuberosity. Chronic erosion of the  lower sacrum and coccyx. No subcutaneous emphysema. IMPRESSION: 1. No evidence of necrotizing infection. 2. Large sacral and bilateral ischial decubitus ulcers extending to bone with chronic osteomyelitis of  the sacrum, coccyx, and both ischial tuberosities. 3. Mild interval increase in size of the infrarenal abdominal aortic aneurysm measuring up to 3.2 cm, previously 3.0 cm. Recommend follow-up every 3 years. This recommendation follows ACR consensus guidelines: White Paper of the ACR Incidental Findings Committee II on Vascular Findings. J Am Coll Radiol 2013; 67:619-509. 4. Aortic Atherosclerosis (ICD10-I70.0). Electronically Signed   By: Obie Dredge M.D.   On: 02/06/2021 21:28   DG Chest Portable 1 View  Result Date: 02/06/2021 CLINICAL DATA:  Altered mental status.  Found unresponsive. EXAM: PORTABLE CHEST 1 VIEW COMPARISON:  May 23, 2020 FINDINGS: Mild, stable linear scarring is seen within the lateral aspect of the left lung base. Mild left-sided volume loss is noted. There is no evidence of acute infiltrate, pleural effusion or pneumothorax. The heart size and mediastinal contours are within normal limits. There is marked severity calcification of the aortic arch. The visualized skeletal structures are unremarkable. IMPRESSION: Mild left basilar linear scarring without evidence of acute or active cardiopulmonary disease. Electronically Signed   By: Aram Candela M.D.   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  Subsequently found to have blood sugar of 24, responding to D50 in the ER     Cellulitis  scrotum   Suspect sepsis, possible severe vs SIRS related to hypoglycemia -Patient with AMS, hypoglycemia, hypotension, tachypneic, leukocytosis of  15,000 though lactic acid 1.8. -Cellulitis of scrotum on physical exam -CT abdomen and pelvis negative for necrotizing infection-Large sacral and bilateral ischial decubitus ulcers. Chronic osteomyelitis of sacrum, coccyx and ischial tuberosities No evidence of necrotizing infection of scrotum-  -Chest x-ray was nonacute. -Infectious disease consultation appreciated. Currently on linezolid and unasyn -- Blood culture one out of four positive for VRE.     Unresponsiveness/Acute metabolic encephalopathy-- resolved -Secondary to hypoglycemia possibly +/-sepsis -At baseline patient is awake and alert and was oriented following admissions -Fall and aspiration precautions  Hypoglycemia Diabetes type 2 on insulin -Blood sugar was 24, improving with 1 amp D50 -Possibly related to ongoing insulin treatment possibly decreased oral intake related as well as to suspected acute infection and suspected sepsis -CBG change to ACHS    Chronic anemia -Hemoglobin of 6.3 to 7.8 which is baseline -- could be delusional with fluids received for treatment with sepsis. Continue to monitor.     Decubitus ulcer of sacral region, stage 4 with diversion colostomy (HCC)   Chronic osteomyelitis of sacrum (HCC)   Wheelchair bound   Severe protein-energy malnutrition (HCC)   DNR (do not resuscitate) discussion -Patient is bedbound/wheelchair bound at baseline with chronic stage IV decubitus ulcers and chronic osteomyelitis in sacrum, coccyx and ischial tuberosities as seen on CT -Patient also has poor nutritional status, poor mobility  -Wound care and TOC consult appreciated --Colostomy care -Family elected to place patient DNR per conversation with [name] walker (POA)    S/P AKA (above knee amputation) (HCC)   PVD (peripheral vascular disease) (HCC) -Increase nursing assistance with  transfers     COPD (chronic obstructive pulmonary disease) (HCC) -Not acutely exacerbated -Continue home inhalers with duo  nebs as needed   DVT prophylaxis: Lovenox  Code Status: DNR Family Communication:  Teresa walker on thephone  Disposition Plan: Back to previous home environment--LTC--[facility]CC Consults called: none  Status:At the time of admission, it appears that the appropriate admission status for this patient is INPATIENT  Patient currently getting IV antibiotics for positive blood culture. Will continue to monitor and de-escalate antibiotics. Patient will return to [facility] at discharge. He is a DNR.          TOTAL TIME TAKING CARE OF THIS PATIENT: *25* minutes.  >50% time spent on counselling and coordination of care  Note: This dictation was prepared with Dragon dictation along with smaller phrase technology. Any transcriptional errors that result from this process are unintentional.  Enedina Finner M.D    [facility]   CC: Primary care physician; Eloisa Northern, MDPatient ID: Blake Villarreal, male   DOB: 05-11-1946, 75 y.o.   MRN: [id number]

## 2021-02-07 NOTE — TOC Initial Note (Signed)
Transition of Care [facility]) - Initial/Assessment Note    Patient Details  Name: Blake Villarreal MRN: [id number] Date of Birth: [birth year]  Transition [facility]) CM/SW Contact:    Maree Krabbe, LCSW Phone Number: 02/07/2021, 9:17 AM  Clinical Narrative:   CSW spoke with [name] pt's POA. [name] states pt is LTC at [facility]. [name] states pt also is service connected at the [facility]. Per [name] the plan for pt is for pt to return to [facility] at d/c. [name] states pt sees the MD at the facility. Pt also gets all his meds through the Pharmacy at the SNF. Teresa sets up [facility] transport when pt has outside appointments. [name] is concerned with pt's poor oral intake and malnutrition when he is not in the hospital. [name] is also stating pt has a [facility] appointment tomorrow and was wondering when pt would be d/c. CSW will address these concerns at progression rounds. CSW will communicate with SNF to facilitate dc when pt is stable.                 Expected Discharge Plan: Skilled Nursing Facility Barriers to Discharge: Continued Medical Work up   Patient Goals and CMS Choice     Choice offered to / list presented to : [facility] POA / Guardian  Expected Discharge Plan and Services Expected Discharge Plan: Skilled Nursing Facility In-house Referral: Clinical Social Work   Post Acute Care Choice: Skilled Nursing Facility Living arrangements for the past 2 months: Skilled Nursing Facility                                      Prior Living Arrangements/Services Living arrangements for the past 2 months: Skilled Nursing Facility Lives with:: Facility Resident Patient language and need for interpreter reviewed:: Yes Do you feel safe going back to the place where you live?: Yes      Need for Family Participation in Patient Care: Yes (Comment) Care giver support system in place?: Yes (comment)   Criminal Activity/Legal Involvement Pertinent to Current  Situation/Hospitalization: No - Comment as needed  Activities of Daily Living Home Assistive Devices/Equipment: Wheelchair ADL Screening (condition at time of admission) Patient's cognitive ability adequate to safely complete daily activities?: Yes Is the patient deaf or have difficulty hearing?: No Does the patient have difficulty seeing, even when wearing glasses/contacts?: No Does the patient have difficulty concentrating, remembering, or making decisions?: No Patient able to express need for assistance with ADLs?: Yes Does the patient have difficulty dressing or bathing?: Yes Independently performs ADLs?: No Communication: Independent Is this a change from baseline?: Pre-admission baseline Dressing (OT): Needs assistance Is this a change from baseline?: Pre-admission baseline Grooming: Needs assistance Is this a change from baseline?: Pre-admission baseline Feeding: Independent Is this a change from baseline?: Pre-admission baseline Bathing: Needs assistance Is this a change from baseline?: Pre-admission baseline Toileting: Dependent Is this a change from baseline?: Pre-admission baseline In/Out Bed: Dependent Is this a change from baseline?: Pre-admission baseline Walks in Home: Dependent Is this a change from baseline?: Pre-admission baseline Does the patient have difficulty walking or climbing stairs?: Yes Weakness of Legs: Both Weakness of Arms/Hands: Both  Permission Sought/Granted Permission sought to share information with : Family Supports    Share Information with NAME: [name]  Permission granted to share info w AGENCY: [facility]  Permission granted to share info w Relationship:  POA     Emotional Assessment Appearance:: Appears stated age Attitude/Demeanor/Rapport: Unable to Assess Affect (typically observed): Unable to Assess Orientation: : Oriented to Self,Oriented to Place,Oriented to  Time Alcohol / Substance Use: Not Applicable Psych  Involvement: No (comment)  Admission diagnosis:  Dehydration [E86.0] Hypoglycemia [E16.2] Cellulitis, scrotum [N49.2] Altered mental status, unspecified altered mental status type [R41.82] Patient Active Problem List   Diagnosis Date Noted  . Acute metabolic encephalopathy 02/06/2021  . Hypoglycemia due to insulin 02/06/2021  . PVD (peripheral vascular disease) (HCC) 02/06/2021  . Chronic osteomyelitis of sacrum (HCC) 02/06/2021  . COPD (chronic obstructive pulmonary disease) (HCC) 02/06/2021  . Scrotal ulcer 02/06/2021  . Cellulitis of scrotum 02/06/2021  . Cellulitis, scrotum 02/06/2021  . Wheelchair bound 02/06/2021  . Sepsis (HCC) 02/06/2021  . Elevated LFTs 02/06/2021  . AKI (acute kidney injury) (HCC) 01/22/2021  . Acute on chronic respiratory failure with hypoxia (HCC) 01/22/2021  . Decubitus ulcer of sacral region, stage 4 (HCC) 01/22/2021  . Severe protein-energy malnutrition (HCC) 01/22/2021  . S/P AKA (above knee amputation) (HCC) 01/22/2021  . Hypoxia 01/22/2021  . Acute CHF (HCC) 05/20/2020  . Healthcare-associated pneumonia   . DNR (do not resuscitate) discussion   . Palliative care encounter   . Pressure injury of skin 08/16/2017  . Acute encephalopathy   . Chronic anemia 08/15/2017  . Acute respiratory failure (HCC)    PCP:  Eloisa Northern, MD Pharmacy:   [facility]  [address] [place] [address] Phone: [phone] Fax: [phone]     Social Determinants of Health (SDOH) Interventions    Readmission Risk Interventions Readmission Risk Prevention Plan 02/07/2021  Transportation Screening Complete  PCP or Specialist Appt within 3-5 Days Complete  HRI or Home Care Consult Complete  Social Work Consult for Recovery Care Planning/Counseling Complete  Palliative Care Screening Not Applicable  Medication Review Oceanographer) Complete  Some recent data might be hidden

## 2021-02-08 DIAGNOSIS — R7881 Bacteremia: Secondary | ICD-10-CM | POA: Diagnosis not present

## 2021-02-08 DIAGNOSIS — L8944 Pressure ulcer of contiguous site of back, buttock and hip, stage 4: Secondary | ICD-10-CM | POA: Diagnosis not present

## 2021-02-08 DIAGNOSIS — B9620 Unspecified Escherichia coli [E. coli] as the cause of diseases classified elsewhere: Secondary | ICD-10-CM

## 2021-02-08 DIAGNOSIS — M8668 Other chronic osteomyelitis, other site: Secondary | ICD-10-CM

## 2021-02-08 DIAGNOSIS — B952 Enterococcus as the cause of diseases classified elsewhere: Secondary | ICD-10-CM | POA: Diagnosis not present

## 2021-02-08 DIAGNOSIS — A4181 Sepsis due to Enterococcus: Principal | ICD-10-CM

## 2021-02-08 LAB — BLOOD CULTURE ID PANEL (REFLEXED) - BCID2

## 2021-02-08 LAB — CBC
HCT: 19.9 % — ABNORMAL LOW (ref 39.0–52.0)
Hemoglobin: 6.4 g/dL — ABNORMAL LOW (ref 13.0–17.0)
MCH: 26.8 pg (ref 26.0–34.0)
MCHC: 32.2 g/dL (ref 30.0–36.0)
MCV: 83.3 fL (ref 80.0–100.0)
Platelets: 159 10*3/uL (ref 150–400)
RBC: 2.39 MIL/uL — ABNORMAL LOW (ref 4.22–5.81)
RDW: 20.6 % — ABNORMAL HIGH (ref 11.5–15.5)
WBC: 8.2 10*3/uL (ref 4.0–10.5)
nRBC: 0 % (ref 0.0–0.2)

## 2021-02-08 LAB — GLUCOSE, CAPILLARY
Glucose-Capillary: 184 mg/dL — ABNORMAL HIGH (ref 70–99)
Glucose-Capillary: 137 mg/dL — ABNORMAL HIGH (ref 70–99)
Glucose-Capillary: 149 mg/dL — ABNORMAL HIGH (ref 70–99)
Glucose-Capillary: 168 mg/dL — ABNORMAL HIGH (ref 70–99)

## 2021-02-08 MED ORDER — SODIUM CHLORIDE 0.9 % IV SOLN
1.0000 g | Freq: Two times a day (BID) | INTRAVENOUS | Status: DC
  Administered 2021-02-08 – 2021-02-12 (×10): 1 g via INTRAVENOUS
  Filled 2021-02-08 (×11): qty 1

## 2021-02-08 MED ORDER — INSULIN ASPART 100 UNIT/ML ~~LOC~~ SOLN
0.0000 [IU] | Freq: Three times a day (TID) | SUBCUTANEOUS | Status: DC
  Administered 2021-02-08 – 2021-02-09 (×3): 1 [IU] via SUBCUTANEOUS
  Administered 2021-02-12 – 2021-02-13 (×2): 2 [IU] via SUBCUTANEOUS
  Filled 2021-02-08 (×5): qty 1

## 2021-02-08 MED ORDER — INSULIN ASPART 100 UNIT/ML ~~LOC~~ SOLN: 0.0000 [IU] | Freq: Every day | SUBCUTANEOUS | Status: DC

## 2021-02-08 NOTE — Progress Notes (Signed)
[facility]  - [place] at [facility]   PATIENT NAME: Blake Villarreal    MR#:  [id number]  DATE OF BIRTH:  12/21/1945  SUBJECTIVE:  patient came in with acute onset confusion and lethargy at [facility]. He was found to have sugar of 24. Awake alert oriented to place in person. Able to eat. Patient has bedbound due to his severe decubitus.  No sob  REVIEW OF SYSTEMS:   Review of Systems  Constitutional: Negative for chills, fever and weight loss.  HENT: Negative for ear discharge, ear pain and nosebleeds.   Eyes: Negative for blurred vision, pain and discharge.  Respiratory: Negative for sputum production, shortness of breath, wheezing and stridor.   Cardiovascular: Negative for chest pain, palpitations, orthopnea and PND.  Gastrointestinal: Negative for abdominal pain, diarrhea, nausea and vomiting.  Genitourinary: Negative for frequency and urgency.  Musculoskeletal: Negative for back pain and joint pain.  Neurological: Positive for weakness. Negative for sensory change, speech change and focal weakness.  Psychiatric/Behavioral: Negative for depression and hallucinations. The patient is not nervous/anxious.    Tolerating Diet:yes Tolerating PT: bedbound  DRUG ALLERGIES:   Allergies  Allergen Reactions  . Daptomycin   . Iodinated Diagnostic Agents     VITALS:  Blood pressure 120/61, pulse 77, temperature 98.9 F (37.2 C), temperature source Oral, resp. rate 18, height 5\' 9"  (1.753 m), weight 54 kg, SpO2 98 %.  PHYSICAL EXAMINATION:   Physical Exam  GENERAL:  75 y.o.-year-old patient lying in the bed with no acute distress. Chronically ill HEENT: Head atraumatic, normocephalic. Oropharynx and nasopharynx clear.  LUNGS: Normal breath sounds bilaterally, no wheezing, rales, rhonchi. No use of accessory muscles of respiration.  CARDIOVASCULAR: S1, S2 normal. No murmurs, rubs, or gallops.  ABDOMEN: Soft, nontender, nondistended. Bowel sounds  present. No organomegaly or mass.  EXTREMITIES: AKA+ NEUROLOGIC: no focal weakness PSYCHIATRIC:  patient is alert and oriented x 2 SKIN:       LABORATORY PANEL:  CBC Recent Labs  Lab 02/07/21 0526  WBC 11.4*  HGB 6.5*  HCT 20.6*  PLT 223    Chemistries  Recent Labs  Lab 02/06/21 1754 02/07/21 0526  NA 138 139  K 4.4 3.9  CL 110 113*  CO2 22 19*  GLUCOSE 72 77  BUN 41* 35*  CREATININE 1.52* 1.01  CALCIUM 8.0* 7.3*  AST 131*  --   ALT 79*  --   ALKPHOS 144*  --   BILITOT 0.6  --    Cardiac Enzymes No results for input(s): TROPONINI in the last 168 hours. RADIOLOGY:  CT PELVIS WO CONTRAST  Result Date: 02/06/2021 CLINICAL DATA:  Scrotal swelling and ulceration. Evaluate for gangrene. EXAM: CT PELVIS WITHOUT CONTRAST TECHNIQUE: Multidetector CT imaging of the pelvis was performed following the standard protocol without intravenous contrast. COMPARISON:  CT abdomen pelvis dated May 30, 2017. FINDINGS: Urinary Tract: No abnormality visualized. Punctate calcification in the visualized lower pole of the right kidney is likely vascular in etiology when compared to prior study. Bowel: Left lower quadrant loop colostomy. No bowel wall thickening, distention, or surrounding inflammatory changes. Vascular/Lymphatic: Mild interval increase in size of the infrarenal abdominal aortic aneurysm measuring up to 3.2 cm, previously 3.0 cm. Extensive aortoiliac atherosclerotic calcification prior fem-fem bypass graft. No enlarged pelvic lymph nodes. Reproductive:  Prostate gland is unremarkable. Other:  None. Musculoskeletal: Large sacral and bilateral ischial decubitus ulcers extending to bone. Chronic smooth erosion of both ischial tuberosities. Increased sclerosis in the right ischial  tuberosity. Chronic erosion of the lower sacrum and coccyx. No subcutaneous emphysema. IMPRESSION: 1. No evidence of necrotizing infection. 2. Large sacral and bilateral ischial decubitus ulcers extending to  bone with chronic osteomyelitis of the sacrum, coccyx, and both ischial tuberosities. 3. Mild interval increase in size of the infrarenal abdominal aortic aneurysm measuring up to 3.2 cm, previously 3.0 cm. Recommend follow-up every 3 years. This recommendation follows ACR consensus guidelines: White Paper of the ACR Incidental Findings Committee II on Vascular Findings. J Am Coll Radiol 2013; 67:341-937. 4. Aortic Atherosclerosis (ICD10-I70.0). Electronically Signed   By: Obie Dredge M.D.   On: 02/06/2021 21:28   DG Chest Portable 1 View  Result Date: 02/06/2021 CLINICAL DATA:  Altered mental status.  Found unresponsive. EXAM: PORTABLE CHEST 1 VIEW COMPARISON:  May 23, 2020 FINDINGS: Mild, stable linear scarring is seen within the lateral aspect of the left lung base. Mild left-sided volume loss is noted. There is no evidence of acute infiltrate, pleural effusion or pneumothorax. The heart size and mediastinal contours are within normal limits. There is marked severity calcification of the aortic arch. The visualized skeletal structures are unremarkable. IMPRESSION: Mild left basilar linear scarring without evidence of acute or active cardiopulmonary disease. Electronically Signed   By: Aram Candela M.D.   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  Subsequently found to have blood sugar of 24, responding to D50 in the ER     Cellulitis  scrotum   Enterococcus sepsis -Patient with AMS, hypoglycemia, hypotension, tachypneic, leukocytosis of 15,000 though  lactic acid 1.8. -CT abdomen and pelvis negative for necrotizing infection-Large sacral and bilateral ischial decubitus ulcers. Chronic osteomyelitis of sacrum, coccyx and ischial tuberosities No evidence of necrotizing infection of scrotum-  -Chest x-ray was nonacute. -Infectious disease consultation appreciated. --2/24 Currently on linezolid and Meropenem -- Blood culture 4/4 VRE    Unresponsiveness/Acute metabolic encephalopathy-- resolved -Secondary to hypoglycemia possibly +/-sepsis -At baseline patient is awake and alert and was oriented following commands -Fall and aspiration precautions  Hypoglycemia Diabetes type 2 on insulin -sugars stable now --holding Insulin   Chronic anemia -Hemoglobin of 6.3 to 7.8 which is baseline --could be dilutional with fluids received for treatment with sepsis.  --cbc in am     Decubitus ulcer of sacral region, stage 4 with diversion colostomy (HCC)   Chronic osteomyelitis of sacrum (HCC)   Wheelchair bound   Severe protein-energy malnutrition (HCC)   DNR (do not resuscitate) discussion -Patient is bedbound/wheelchair bound at baseline with chronic stage IV decubitus ulcers and chronic osteomyelitis in sacrum, coccyx and ischial tuberosities as seen on CT -Patient also has poor nutritional status, poor mobility  -Wound care and TOC consult appreciated --Colostomy care -Family elected to place patient DNR per conversation with [name] walker (POA)    S/P AKA (above knee amputation) (HCC)   PVD (peripheral vascular disease) (HCC)     COPD (chronic obstructive pulmonary disease) (HCC) Recent COVID infection--p asymptomatic--d/c airborne isolation -Not acutely exacerbated -Continue home inhalers with duo nebs as needed   DVT prophylaxis: Lovenox  Code Status: DNR Family Communication:  Teresa walker on the phone 2/24 Disposition Plan:  Back to previous home environment--LTC--[facility]CC Consults called: none  Status:At the time of admission,  it appears that the appropriate admission status for this patient is INPATIENT  Patient being treated for enterococcus sepsis. Continue IV antibiotics.  TOTAL TIME TAKING CARE OF THIS PATIENT: *25* minutes.  >50% time spent on counselling and coordination of care  Note: This dictation was prepared with Dragon dictation along with smaller phrase technology. Any transcriptional errors that result from this process are unintentional.  Enedina Finner M.D    [facility]   CC: Primary care physician; Eloisa Northern, MDPatient ID: Blake Villarreal, male   DOB: 08/07/1946, 75 y.o.   MRN: [id number]

## 2021-02-08 NOTE — TOC Progression Note (Signed)
Transition of Care [facility]) - Progression Note    Patient Details  Name: Blake Villarreal MRN: [id number] Date of Birth: 11/25/1946  Transition of Care [facility]) CM/SW Contact  Chapman Fitch, RN Phone Number: 02/08/2021, 2:36 PM  Clinical Narrative:    Per MD patient will potentially need IV antibiotics when he returns back to LTC   Expected Discharge Plan: Skilled Nursing Facility Barriers to Discharge: Continued Medical Work up  Expected Discharge Plan and Services Expected Discharge Plan: Skilled Nursing Facility In-house Referral: Clinical Social Work   Post Acute Care Choice: Skilled Nursing Facility Living arrangements for the past 2 months: Skilled Nursing Facility                                       Social Determinants of Health (SDOH) Interventions    Readmission Risk Interventions Readmission Risk Prevention Plan 02/07/2021  Transportation Screening Complete  PCP or Specialist Appt within 3-5 Days Complete  HRI or Home Care Consult Complete  Social Work Consult for Recovery Care Planning/Counseling Complete  Palliative Care Screening Not Applicable  Medication Review Oceanographer) Complete  Some recent data might be hidden

## 2021-02-08 NOTE — Progress Notes (Signed)
ID Pt in bed No specific complaints No fever No cough or sob  Patient Vitals for the past 24 hrs:  BP Temp Temp src Pulse Resp SpO2  02/08/21 2021 (!) 130/59 99.3 F (37.4 C) Oral 87 18 100 %  02/08/21 1645 112/63 99.1 F (37.3 C) Oral 83 20 100 %  02/08/21 1202 120/61 98.9 F (37.2 C) Oral 77 18 98 %  02/08/21 0909 138/64 99.6 F (37.6 C) -- 83 -- 100 %  02/08/21 0307 (!) 118/57 99.3 F (37.4 C) Oral 90 18 100 %   Emaciated  Awake Oriented in place, person,  Responds appropriately to commands Chest b/l air entry [id number] abd soft Colostomy Sacral and b/l ischial ulcers Rt AKA   Labs CBC Latest Ref Rng & Units 02/07/2021 02/06/2021 01/28/2021  WBC 4.0 - 10.5 K/uL 11.4(H) 14.9(H) 12.9(H)  Hemoglobin 13.0 - 17.0 g/dL 1.6(X) 7.8(L) 7.6(L)  Hematocrit 39.0 - 52.0 % 20.6(L) 24.4(L) 23.9(L)  Platelets 150 - 400 K/uL 223 272 514(H)    CMP Latest Ref Rng & Units 02/07/2021 02/06/2021 01/28/2021  Glucose 70 - 99 mg/dL 77 72 096(E)  BUN 8 - 23 mg/dL 45(W) 09(W) 11(B)  Creatinine 0.61 - 1.24 mg/dL 1.47 8.29(F) 6.21  Sodium 135 - 145 mmol/L 139 138 136  Potassium 3.5 - 5.1 mmol/L 3.9 4.4 3.9  Chloride 98 - 111 mmol/L 113(H) 110 108  CO2 22 - 32 mmol/L 19(L) 22 22  Calcium 8.9 - 10.3 mg/dL 7.3(L) 8.0(L) 8.0(L)  Total Protein 6.5 - 8.1 g/dL - 5.5(L) 5.4(L)  Total Bilirubin 0.3 - 1.2 mg/dL - 0.6 3.0(Q)  Alkaline Phos 38 - 126 U/L - 144(H) 77  AST 15 - 41 U/L - 131(H) 36  ALT 0 - 44 U/L - 79(H) 43   Micro 02/06/2021 blood culture VRE and ESBL E. coli  Impression/recommendation  VRE bacteremia and ESBL E. coli bacteremia.  S source very likely sacral and ischial ulcers.  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 Would recommend surgical consultation for debridement and deep cultures and biopsy of the bone. Continue Dakin's for  local wound care.  Continue antibiotics. He has 2 organisms which are multidrug-resistant organisms and if need to be treated for the chronic osteomyelitis he will need long-term IV antibiotics.  But this is futile as he will not clear the infection or heal the wounds because of his bedbound status, severe malnutrition, chronic anemia. Need to have the discussion with the family  Acute metabolic encephalopathy/unresponsiveness has resolved. This was due to hypoglycemia.  Diabetes mellitus patient presented with hypoglycemia.  Now sugars are stable.  Insulin on hold  COPD  Anemia: This is chronic and patient has received iron transfusion as well as blood transfusion in the past  Patient has colostomy  Status post AKA Peripheral vascular disease  Recent Covid infection.  Asymptomatic.  No need for airborne isolation.  Discussed the management with the care team.

## 2021-02-08 NOTE — Progress Notes (Signed)
Patient repeatedly refusing HOB >30 degrees during eating.  Patient was educated on risk of aspiration which can complicate things with his current infection. Patient has a lot of pain when sat up, was given 2mg  morphine prior to meal/dressing change, but still unable to tolerate.  MD aware.  Dressing change completed at 1400.  Initiated male purewick to prevent patient from soiling dressing.

## 2021-02-08 NOTE — Progress Notes (Signed)
PHARMACY - PHYSICIAN COMMUNICATION CRITICAL VALUE ALERT - BLOOD CULTURE IDENTIFICATION (BCID)  Pt currently on Unasyn and Zyvox after earlier BCID results of Enterococcus faecalis w/ Zenaida Niece resistance gene detected.  New BCID results:  1 bottle (aerobic) growing E Coli with ESBL detected.  Name of physician (or Provider) Contacted: Cliffton Asters, NP  Changes to prescribed antibiotics required: Continue Zyvox and transitioned pt from Unasyn to Meropenem for increased ESBL Coverage.  Otelia Sergeant, PharmD, [facility] 02/08/2021 3:28 AM

## 2021-02-09 DIAGNOSIS — A498 Other bacterial infections of unspecified site: Secondary | ICD-10-CM | POA: Diagnosis not present

## 2021-02-09 DIAGNOSIS — Z515 Encounter for palliative care: Secondary | ICD-10-CM | POA: Diagnosis not present

## 2021-02-09 DIAGNOSIS — Z7189 Other specified counseling: Secondary | ICD-10-CM | POA: Diagnosis not present

## 2021-02-09 DIAGNOSIS — R7881 Bacteremia: Secondary | ICD-10-CM | POA: Diagnosis not present

## 2021-02-09 DIAGNOSIS — E119 Type 2 diabetes mellitus without complications: Secondary | ICD-10-CM

## 2021-02-09 DIAGNOSIS — G9340 Encephalopathy, unspecified: Secondary | ICD-10-CM

## 2021-02-09 DIAGNOSIS — L89154 Pressure ulcer of sacral region, stage 4: Secondary | ICD-10-CM | POA: Diagnosis not present

## 2021-02-09 DIAGNOSIS — Z1612 Extended spectrum beta lactamase (ESBL) resistance: Secondary | ICD-10-CM

## 2021-02-09 DIAGNOSIS — I739 Peripheral vascular disease, unspecified: Secondary | ICD-10-CM

## 2021-02-09 DIAGNOSIS — B952 Enterococcus as the cause of diseases classified elsewhere: Secondary | ICD-10-CM | POA: Diagnosis not present

## 2021-02-09 DIAGNOSIS — M8668 Other chronic osteomyelitis, other site: Secondary | ICD-10-CM | POA: Diagnosis not present

## 2021-02-09 LAB — GLUCOSE, CAPILLARY
Glucose-Capillary: 146 mg/dL — ABNORMAL HIGH (ref 70–99)
Glucose-Capillary: 101 mg/dL — ABNORMAL HIGH (ref 70–99)
Glucose-Capillary: 131 mg/dL — ABNORMAL HIGH (ref 70–99)
Glucose-Capillary: 107 mg/dL — ABNORMAL HIGH (ref 70–99)

## 2021-02-09 MED ORDER — MORPHINE SULFATE (PF) 2 MG/ML IV SOLN
2.0000 mg | Freq: Four times a day (QID) | INTRAVENOUS | Status: DC | PRN
  Administered 2021-02-09 – 2021-02-12 (×3): 2 mg via INTRAVENOUS
  Filled 2021-02-09 (×3): qty 1

## 2021-02-09 MED ORDER — TRAMADOL HCL 50 MG PO TABS: 50.0000 mg | ORAL_TABLET | Freq: Four times a day (QID) | ORAL | Status: DC | PRN

## 2021-02-09 MED ORDER — OXYCODONE HCL 5 MG PO TABS
5.0000 mg | ORAL_TABLET | ORAL | Status: DC | PRN
  Administered 2021-02-09 – 2021-02-13 (×5): 5 mg via ORAL
  Filled 2021-02-09 (×5): qty 1

## 2021-02-09 MED ORDER — MIRTAZAPINE 15 MG PO TABS
7.5000 mg | ORAL_TABLET | Freq: Every day | ORAL | Status: DC
  Administered 2021-02-10 – 2021-02-12 (×3): 7.5 mg via ORAL
  Filled 2021-02-09 (×2): qty 1
  Filled 2021-02-09: qty 0.5

## 2021-02-09 NOTE — Consult Note (Signed)
Consultation Note Date: 02/09/2021   Patient Name: Blake Villarreal  DOB: [birth year]  MRN: [id number]  Age / Sex: 75 y.o., male  PCP: Garwin Brothers, MD Referring Physician: Fritzi Mandes, MD  Reason for Consultation: Establishing goals of care  HPI/Patient Profile: 75 y.o. male  with past medical history of T2DM on insulin, COPD, CAD, PVD s/p right AKA, chronic advanced sacral decubitus ulcers with chronic osteomyelitis and with diversion colostomy, chronic anemia and protein calorie malnutrition admitted on 02/06/2021 after being found unresponsive at [facility]. Patient recently hospitalized 2/7-2/13 for aspiration pneumonia. This admission patient diagnosed with scrotal cellulitis and suspected sepsis.  Found to have VRE bacteremia and ESBL E. coli bacteremia; source very likely sacral and ischial ulcers. Per ID MD, he has 2 organisms which are multidrug-resistant organisms and if need to be treated for the chronic osteomyelitis he will need long-term IV antibiotics; but, this is futile as he will not clear the infection or heal the wounds because of his bedbound status, severe malnutrition, chronic anemia. PMT consulted for [place] discussion.   Clinical Assessment and Goals of Care: I have reviewed medical records including EPIC notes, labs and imaging, received report from Dr Posey Pronto, assessed the patient and then met with patient  to discuss diagnosis prognosis, [place], EOL wishes, disposition and options.  I introduced Palliative Medicine as specialized medical care for people living with serious illness. It focuses on providing relief from the symptoms and stress of a serious illness. The goal is to improve quality of life for both the patient and the family.  Mr. Snuffer tells me about living at [facility] [facility]. He tells me about wanting to eat but then when he gets food his appetite is gone.    We discussed patient's current illness and what it means in the  larger context of patient's on-going co-morbidities. We discussed his severe infection with drug resistant bacteria. We discuss difficulties with treatment.   I attempted to elicit values and goals of care important to the patient.  He initially tells me "I want to get better".   The difference between aggressive medical intervention and comfort care was considered in light of the patient's goals of care. We discuss expected continued complications with ongoing aggressive care. Discussed expected repeat hospitalizations. Discuss concerns about quality of life. Discussed alternative of focusing on his comfort and quality of life, avoiding aggressive interventions and involving hospice in his care.   Mr. Outten is visibly overwhelmed by this conversation - shakes his head, puts his head in his hands. Emotional support was provided throughout.   Mr Bautch does share that he wants his cousin - Blake Villarreal - to be his medical power of attorney and make decisions for him if he is unable. He tells me it is okay to call her to discuss above with her.   Mr Crispo tells me he is unsure how to move forward with his care. He then closes his eyes and disengages. I ask him if he would like time alone to consider his options. He tells me he does.   I called and spoke with Blake Villarreal. We reviewed the above - reviewed Mr. Ace diagnosis, difficulties with treatment, and options moving forward: continued aggressive measures with concern of futility vs focusing on comfort.   Blake Villarreal expresses understanding of situation and understanding of futility. She tells me how significantly Mr. Rosebrook health has been declining since Sept 2021 - poor appetite, significant weight loss, multiple hospitalizations here and the [place], increasing confusion.  She tells me she does not think that Mr. Tillett will agree to a more comfort focused approach to his care; however, she will discuss this with him further. She tells me it is  ultimately his decision and she requests we continue to discuss with him.   Discussed with patient/family the importance of continued conversation with family and the medical providers regarding overall plan of care and treatment options, ensuring decisions are within the context of the patient's values and GOCs.    Questions and concerns were addressed. The family was encouraged to call with questions or concerns.   Primary Decision Maker PATIENT - joined by his cousin Blake Villarreal  Mr. Banghart has children who are next of kin however it is well documented throughout his chart and he has told me again today that he would like Blake Villarreal to be his decision maker if he were unable - I have requested spiritual care to obtain [place] conversation with patient and cousin regarding continued aggressive care vs shift to focus on comfort, involve hospice - no decisions at this time, patient asks for time to consider and cousin plans to discuss with patient further Will ask PMT to follow up early next week  Code Status/Advance Care Planning:  DNR  Prognosis:   Unable to determine  Discharge Planning: To Be Determined      Primary Diagnoses: Present on Admission: . Severe protein-energy malnutrition ([place]) . Decubitus ulcer of sacral region, stage 4 ([place]) . Cellulitis, scrotum   I have reviewed the medical record, interviewed the patient and family, and examined the patient. The following aspects are pertinent.  Past Medical History:  Diagnosis Date  . Anemia   . CHF (congestive heart failure) ([place])   . CKD (chronic kidney disease)   . COPD (chronic obstructive pulmonary disease) ([place])   . Coronary artery disease   . Diabetes mellitus without complication ([place])   . Dyspnea   . Hx of AKA (above knee amputation), left ([place])   . Hypertension   . Intermittent confusion   . PAD (peripheral artery disease) ([place])   . Pneumonia   . Pressure ulcer, sacrum   .  PTSD (post-traumatic stress disorder)    Social History   Socioeconomic History  . Marital status: Single    Spouse name: Not on file  . Number of children: Not on file  . Years of education: Not on file  . Highest education level: Not on file  Occupational History  . Not on file  Tobacco Use  . Smoking status: Former Smoker    Types: Cigarettes  . Smokeless tobacco: Former Network engineer  . Vaping Use: Never used  Substance and Sexual Activity  . Alcohol use: Never    Comment: Drank heavily in the past per his cousin  . Drug use: Never  . Sexual activity: Not Currently  Other Topics Concern  . Not on file  Social History Narrative   ** Merged History Encounter **       Social Determinants of Health   Financial Resource Strain: Not on file  Food Insecurity: Not on file  Transportation Needs: Not on file  Physical Activity: Not on file  Stress: Not on file  Social Connections: Not on file   Family History  Problem Relation Age of Onset  . CAD Neg Hx    Scheduled Meds: . vitamin C  1,000 mg Oral Daily  . atorvastatin  40 mg Oral  QPM  . carvedilol  6.25 mg Oral BID WC  . cholecalciferol  1,000 Units Oral Daily  . enoxaparin (LOVENOX) injection  40 mg Subcutaneous Q24H  . feeding supplement (NEPRO CARB STEADY)  237 mL Oral TID BM  . ferrous sulfate  325 mg Oral TID WC  . folic acid  1 mg Oral Daily  . gabapentin  100 mg Oral TID  . insulin aspart  0-5 Units Subcutaneous QHS  . insulin aspart  0-9 Units Subcutaneous TID WC  . multivitamin with minerals  1 tablet Oral Daily  . sertraline  50 mg Oral Daily  . sodium hypochlorite   Irrigation BID  . tamsulosin  0.4 mg Oral Daily   Continuous Infusions: . linezolid (ZYVOX) IV Stopped (02/09/21 0438)  . meropenem (MERREM) IV 1 g (02/09/21 0940)   PRN Meds:.acetaminophen **OR** acetaminophen, morphine injection, ondansetron **OR** ondansetron (ZOFRAN) IV, oxyCODONE, traMADol Allergies  Allergen Reactions  .  Daptomycin   . Iodinated Diagnostic Agents    Review of Systems  Constitutional: Positive for activity change, appetite change and fatigue.    Physical Exam Constitutional:      General: He is not in acute distress. Pulmonary:     Effort: Pulmonary effort is normal. No respiratory distress.  Skin:    General: Skin is warm and dry.  Neurological:     Mental Status: He is alert and oriented to person, place, and time.     Vital Signs: BP (!) 127/56 (BP Location: Right Arm)   Pulse 77   Temp 98.8 F (37.1 C) (Oral)   Resp 14   Ht 5' 9"  (1.753 m)   Wt 54 kg   SpO2 99%   BMI 17.57 kg/m  Pain Scale: 0-10 POSS *See Group Information*: S-Acceptable,Sleep, easy to arouse Pain Score: 7    SpO2: SpO2: 99 % O2 Device:SpO2: 99 % O2 Flow Rate: .   IO: Intake/output summary:   Intake/Output Summary (Last 24 hours) at 02/09/2021 1330 Last data filed at 02/09/2021 0500 Gross per 24 hour  Intake --  Output 100 ml  Net -100 ml    LBM: Last BM Date: 02/09/21 Baseline Weight: Weight: 54 kg Most recent weight: Weight: 54 kg     Palliative Assessment/Data: PPS 40%    Time Total: 80 minutes Greater than 50%  of this time was spent counseling and coordinating care related to the above assessment and plan.  Juel Burrow, DNP, AGNP-C Palliative Medicine Team [phone] Pager: [phone]

## 2021-02-09 NOTE — Progress Notes (Signed)
   02/09/21 1130  Clinical Encounter Type  Visited With Patient  Visit Type Initial  Referral From Nurse  Consult/Referral To Chaplain  Spiritual Encounters  Spiritual Needs Literature  Chaplain Kaylia Winborne responded to an OR for room 227A, Pt Blake Villarreal. Chaplain Binnie Droessler spoke to nurses at the nurses station and the [employer]. The Pt's Doctor stated, "the Pt wants [name] to be his health care agent but he need help filling the AD out." I advised the Doctor we can help read and go over it but we cannot fill it out for him. One pamphlet was left with Doctor to give to the Pt.

## 2021-02-09 NOTE — Progress Notes (Signed)
Patient has been refusing to turn past 2 days, has multiple stage IV wounds.  Order received from Enedina Finner, MD for air mattress.  Portable equipment called.

## 2021-02-09 NOTE — Progress Notes (Signed)
Dressings soiled in urine- total dressing change completed on bilateral ischeal tuberosities and sacrum.

## 2021-02-09 NOTE — Progress Notes (Signed)
Patient had very poor oral intake today despite encouragement.  He continues to refuse HOB >30 degrees due to pain from wounds.  Patient has received pain management throughout day which has been moderately effective (see MAR/flowsheets).  Was initiated on oxycodone, continues morphine for breakthrough pain.

## 2021-02-09 NOTE — Progress Notes (Signed)
[facility]  - [place] at [facility]   PATIENT NAME: Blake Villarreal    MR#:  [id number]  DATE OF BIRTH:  September 15, 1946  SUBJECTIVE:  patient came in with acute onset confusion and lethargy at [facility] healthcare Awake alert oriented to place in person. Able to eat. Patient has bedbound due to his severe decubitus. Per RN asking for pain meds very often No sob  REVIEW OF SYSTEMS:   Review of Systems  Constitutional: Negative for chills, fever and weight loss.  HENT: Negative for ear discharge, ear pain and nosebleeds.   Eyes: Negative for blurred vision, pain and discharge.  Respiratory: Negative for sputum production, shortness of breath, wheezing and stridor.   Cardiovascular: Negative for chest pain, palpitations, orthopnea and PND.  Gastrointestinal: Negative for abdominal pain, diarrhea, nausea and vomiting.  Genitourinary: Negative for frequency and urgency.  Musculoskeletal: Negative for back pain and joint pain.  Neurological: Positive for weakness. Negative for sensory change, speech change and focal weakness.  Psychiatric/Behavioral: Negative for depression and hallucinations. The patient is not nervous/anxious.    Tolerating Diet:yes Tolerating PT: bedbound  DRUG ALLERGIES:   Allergies  Allergen Reactions  . Daptomycin   . Iodinated Diagnostic Agents     VITALS:  Blood pressure (!) 127/56, pulse 77, temperature 98.8 F (37.1 C), temperature source Oral, resp. rate 14, height 5\' 9"  (1.753 m), weight 54 kg, SpO2 99 %.  PHYSICAL EXAMINATION:   Physical Exam  GENERAL:  75 y.o.-year-old patient lying in the bed with no acute distress. Chronically ill HEENT: Head atraumatic, normocephalic. Oropharynx and nasopharynx clear.  LUNGS: Normal breath sounds bilaterally, no wheezing, rales, rhonchi. No use of accessory muscles of respiration.  CARDIOVASCULAR: S1, S2 normal. No murmurs, rubs, or gallops.  ABDOMEN: Soft, nontender, nondistended. Bowel  sounds present. No organomegaly or mass.  EXTREMITIES: AKA+ NEUROLOGIC: no focal weakness PSYCHIATRIC:  patient is alert and oriented x 2 SKIN:       LABORATORY PANEL:  CBC Recent Labs  Lab 02/08/21 2337  WBC 8.2  HGB 6.4*  HCT 19.9*  PLT 159    Chemistries  Recent Labs  Lab 02/06/21 1754 02/07/21 0526  NA 138 139  K 4.4 3.9  CL 110 113*  CO2 22 19*  GLUCOSE 72 77  BUN 41* 35*  CREATININE 1.52* 1.01  CALCIUM 8.0* 7.3*  AST 131*  --   ALT 79*  --   ALKPHOS 144*  --   BILITOT 0.6  --    Cardiac Enzymes No results for input(s): TROPONINI in the last 168 hours. RADIOLOGY:  No results found. 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  Subsequently found to have blood sugar of 24, responding to D50 in the ER     Cellulitis  scrotum   Enterococcus sepsis -Patient with AMS, hypoglycemia, hypotension, tachypneic, leukocytosis of 15,000 though lactic acid 1.8. -CT abdomen and pelvis negative for necrotizing infection-Large sacral and bilateral ischial decubitus ulcers. Chronic osteomyelitis of sacrum, coccyx and ischial tuberosities No evidence of necrotizing infection of scrotum-  -Chest x-ray was nonacute. -Infectious disease consultation appreciated--tough to treat this resistant Sepsis given multitude of co-morbidities --2/24 Currently on linezolid and  Meropenem -- Blood culture 4/4 VRE    Unresponsiveness/Acute metabolic encephalopathy-- resolved -Secondary to hypoglycemia possibly +/-sepsis -At baseline patient is awake and alert and was  oriented following commands -Fall and aspiration precautions  Hypoglycemia Diabetes type 2 on insulin -sugars stable now --holding Insulin   Chronic anemia -Hemoglobin of 6.3 to 7.8 which is baseline --could be dilutional with fluids received for treatment with sepsis.  --cbc in am--6.4     Decubitus ulcer of sacral region, stage 4 with diversion colostomy (HCC)   Chronic osteomyelitis of sacrum (HCC)   Wheelchair bound   Severe protein-energy malnutrition (HCC)   DNR (do not resuscitate) discussion -Patient is bedbound/wheelchair bound at baseline with chronic stage IV decubitus ulcers and chronic osteomyelitis in sacrum, coccyx and ischial tuberosities as seen on CT -Patient also has poor nutritional status, poor mobility  -Wound care and TOC consult appreciated --Colostomy care -- patient is DNR per conversation with [name] walker (POA)    S/P AKA (above knee amputation) (HCC)   PVD (peripheral vascular disease) (HCC)     COPD (chronic obstructive pulmonary disease) (HCC) Recent COVID infection--p asymptomatic--d/c airborne isolation -Not acutely exacerbated -Continue home inhalers with duo nebs as needed   DVT prophylaxis: Lovenox  Code Status: DNR Family Communication:  Teresa walker on the phone 2/25 Disposition Plan:TBD Consults called: none  Status:At the time of admission, it appears that the appropriate admission status for this patient is INPATIENT  Patient being treated for enterococcus sepsis. Continue IV antibiotics. Discussed with POA Galen Daft on the phone. She understands patient has very poor prognosis. Palliative care consult place. Discussed about hospice/comfort care with POA.  TOTAL TIME TAKING CARE OF THIS PATIENT: *25* minutes.  >50% time spent on counselling and coordination of care  Note: This dictation was prepared with Dragon dictation along with smaller phrase technology. Any transcriptional errors that result from this process are  unintentional.  Enedina Finner M.D    [facility]   CC: Primary care physician; Eloisa Northern, MDPatient ID: Blake Villarreal, male   DOB: 1946/11/02, 75 y.o.   MRN: [id number]

## 2021-02-09 NOTE — Plan of Care (Signed)
  Problem: Clinical Measurements: Goal: Ability to maintain clinical measurements within normal limits will improve Outcome: Progressing Goal: Respiratory complications will improve Outcome: Progressing Goal: Cardiovascular complication will be avoided Outcome: Progressing   Problem: Coping: Goal: Level of anxiety will decrease Outcome: Progressing   Pt is alert and oriented. V/S stable. Morphine IV given before dressing change on his ischial and sacrum. Dressing changed and intact. Colostomy bag in place.

## 2021-02-09 NOTE — Progress Notes (Signed)
Date of Admission:  02/06/2021      ID: Blake Villarreal is a 75 y.o. male Principal Problem:   Cellulitis of scrotum Active Problems:   Chronic anemia   DNR (do not resuscitate) discussion   Decubitus ulcer of sacral region, stage 4 (HCC)   Severe protein-energy malnutrition (HCC)   S/P AKA (above knee amputation) (HCC)   Acute metabolic encephalopathy   Hypoglycemia due to insulin   PVD (peripheral vascular disease) (HCC)   Chronic osteomyelitis of sacrum (HCC)   COPD (chronic obstructive pulmonary disease) (HCC)   Scrotal ulcer   Cellulitis, scrotum   Wheelchair bound   Sepsis (HCC)   Elevated LFTs    Subjective: Patient awake and alert Says he is doing okay   Medications:  . vitamin C  1,000 mg Oral Daily  . atorvastatin  40 mg Oral QPM  . carvedilol  6.25 mg Oral BID WC  . cholecalciferol  1,000 Units Oral Daily  . enoxaparin (LOVENOX) injection  40 mg Subcutaneous Q24H  . feeding supplement (NEPRO CARB STEADY)  237 mL Oral TID BM  . ferrous sulfate  325 mg Oral TID WC  . folic acid  1 mg Oral Daily  . gabapentin  100 mg Oral TID  . insulin aspart  0-5 Units Subcutaneous QHS  . insulin aspart  0-9 Units Subcutaneous TID WC  . [START ON 02/10/2021] mirtazapine  7.5 mg Oral QHS  . multivitamin with minerals  1 tablet Oral Daily  . sertraline  50 mg Oral Daily  . sodium hypochlorite   Irrigation BID  . tamsulosin  0.4 mg Oral Daily    Objective: Vital signs in last 24 hours: Temp:  [98.8 F (37.1 C)-99.8 F (37.7 C)] 98.8 F (37.1 C) (02/25 1143) Pulse Rate:  [77-87] 77 (02/25 1143) Resp:  [14-20] 14 (02/25 1143) BP: (104-130)/(42-68) 127/56 (02/25 1143) SpO2:  [97 %-100 %] 99 % (02/25 1143)  PHYSICAL EXAM:  General: Alert, cooperative, no distress, emaciated, chronically ill Head: Normocephalic, without obvious abnormality, atraumatic. Eyes: Conjunctivae clear, anicteric sclerae. Pupils are equal  Back unstageable sacral decubitus and stage IV  bilateral ischial decubitus        Lungs: Clear to auscultation bilaterally. No Wheezing or Rhonchi. No rales. Heart: Regular rate and rhythm, no murmur, rub or gallop. Abdomen: Soft, colostomy Extremities: Right AKA Patient left leg is in flexion contracture and he always lies on his side l.   Lab Results Recent Labs    02/06/21 1754 02/07/21 0526 02/08/21 2337  WBC 14.9* 11.4* 8.2  HGB 7.8* 6.5* 6.4*  HCT 24.4* 20.6* 19.9*  NA 138 139  --   K 4.4 3.9  --   CL 110 113*  --   CO2 22 19*  --   BUN 41* 35*  --   CREATININE 1.52* 1.01  --    Liver Panel Recent Labs    02/06/21 1754  PROT 5.5*  ALBUMIN 1.5*  AST 131*  ALT 79*  ALKPHOS 144*  BILITOT 0.6    Microbiology: 02/06/2021 Blood culture VRE ( present in 1/4 aerobic bottle of 1 set ESBL E. coli present in both sets.  1 bottle of each  02/09/2021 blood culture has been sent  Assessment/Plan:  VRE bacteremia of low bioburden 1 out of 4 bottle and ESBL E. coli bacteremia in both sets.  Source likely the sacral and ischial ulcers.  Patient currently on linezolid and meropenem.  As the VRE susceptible to ampicillin  we can discontinue the linezolid.  Meropenem will cover both the bacteria.  Chronic sacral and bilateral ischial decubitus ulcer.  Ischial ulcer stage IV with osteomyelitis which is chronic.  Would recommend surgical consultation for debridement and deep culture. Currently getting Dakin's for local wound care. Patient is chronically bedbound ,cannot offload and hence the healing of the ulcers is  impossible.  Antibiotics alone is futile.  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  But he also has severe malnutrition and chronic anemia which prevents  healing along with bed bound status. Continue meropenem.  May have to discharge him on that for a few weeks.  Acute metabolic encephalopathy due to hypoglycemia has resolved.  Diabetes mellitus.  COPD  Chronic anemia  Diverting colostomy  AKA of the  right leg  Peripheral vascular disease  Recent Covid infection.  Asymptomatic.  ID will follow him peripherally this weekend.  Call if needed.

## 2021-02-10 LAB — GLUCOSE, CAPILLARY
Glucose-Capillary: 84 mg/dL (ref 70–99)
Glucose-Capillary: 85 mg/dL (ref 70–99)
Glucose-Capillary: 100 mg/dL — ABNORMAL HIGH (ref 70–99)
Glucose-Capillary: 106 mg/dL — ABNORMAL HIGH (ref 70–99)

## 2021-02-10 LAB — SEDIMENTATION RATE: Sed Rate: 89 mm/hr — ABNORMAL HIGH (ref 0–20)

## 2021-02-10 LAB — C-REACTIVE PROTEIN: CRP: 13.6 mg/dL — ABNORMAL HIGH (ref <1.0)

## 2021-02-10 NOTE — Progress Notes (Signed)
[facility]  - [place] at [facility]   PATIENT NAME: Blake Villarreal    MR#:  [id number]  DATE OF BIRTH:  1946/10/15  SUBJECTIVE:  patient came in with acute onset confusion and lethargy at [facility] Awake alert oriented to place  And person. Not much appetite today Patient has bedbound due to his severe decubitus. No sob  REVIEW OF SYSTEMS:   Review of Systems  Constitutional: Negative for chills, fever and weight loss.  HENT: Negative for ear discharge, ear pain and nosebleeds.   Eyes: Negative for blurred vision, pain and discharge.  Respiratory: Negative for sputum production, shortness of breath, wheezing and stridor.   Cardiovascular: Negative for chest pain, palpitations, orthopnea and PND.  Gastrointestinal: Negative for abdominal pain, diarrhea, nausea and vomiting.  Genitourinary: Negative for frequency and urgency.  Musculoskeletal: Negative for back pain and joint pain.  Neurological: Positive for weakness. Negative for sensory change, speech change and focal weakness.  Psychiatric/Behavioral: Negative for depression and hallucinations. The patient is not nervous/anxious.    Tolerating Diet:yes Tolerating PT: bedbound  DRUG ALLERGIES:   Allergies  Allergen Reactions  . Daptomycin   . Iodinated Diagnostic Agents     VITALS:  Blood pressure 128/62, pulse 79, temperature 98.7 F (37.1 C), temperature source Oral, resp. rate 18, height 5\' 9"  (1.753 m), weight 54 kg, SpO2 100 %.  PHYSICAL EXAMINATION:   Physical Exam  GENERAL:  75 y.o.-year-old patient lying in the bed with no acute distress. Chronically ill HEENT: Head atraumatic, normocephalic. Oropharynx and nasopharynx clear.  LUNGS: Normal breath sounds bilaterally, no wheezing, rales, rhonchi. No use of accessory muscles of respiration.  CARDIOVASCULAR: S1, S2 normal. No murmurs, rubs, or gallops.  ABDOMEN: Soft, nontender, nondistended. Bowel sounds present. No organomegaly or  mass.  EXTREMITIES: AKA+ NEUROLOGIC: no focal weakness PSYCHIATRIC:  patient is alert and oriented x 2 SKIN:       LABORATORY PANEL:  CBC Recent Labs  Lab 02/08/21 2337  WBC 8.2  HGB 6.4*  HCT 19.9*  PLT 159    Chemistries  Recent Labs  Lab 02/06/21 1754 02/07/21 0526  NA 138 139  K 4.4 3.9  CL 110 113*  CO2 22 19*  GLUCOSE 72 77  BUN 41* 35*  CREATININE 1.52* 1.01  CALCIUM 8.0* 7.3*  AST 131*  --   ALT 79*  --   ALKPHOS 144*  --   BILITOT 0.6  --    Cardiac Enzymes No results for input(s): TROPONINI in the last 168 hours. RADIOLOGY:  No results found. 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  Subsequently found to have blood sugar of 24, responding to D50 in the ER     Cellulitis  scrotum   Enterococcus sepsis -Patient with AMS, hypoglycemia, hypotension, tachypneic, leukocytosis of 15,000 though lactic acid 1.8. -CT abdomen and pelvis negative for necrotizing infection-Large sacral and bilateral ischial decubitus ulcers. Chronic osteomyelitis of sacrum, coccyx and ischial tuberosities No evidence of necrotizing infection of scrotum-  -Chest x-ray was nonacute. -Infectious disease consultation appreciated--tough to treat this resistant Sepsis given multitude of co-morbidities --2/24 Currently on linezolid and Meropenem -- Blood culture 4/4 VRE  Unresponsiveness/Acute metabolic encephalopathy-- resolved -Secondary to hypoglycemia possibly +/-sepsis -At baseline patient is awake and alert and was oriented following commands -Fall and  aspiration precautions  Hypoglycemia Diabetes type 2 on insulin -sugars stable now --holding Insulin   Chronic anemia -Hemoglobin of 6.3 to 7.8 which is baseline --could be dilutional with fluids received for treatment with sepsis.  --cbc in am--6.4     Decubitus ulcer of sacral region, stage 4 with diversion colostomy (HCC)   Chronic osteomyelitis of sacrum (HCC)   Wheelchair bound   Severe protein-energy malnutrition (HCC)   DNR (do not resuscitate) discussion -Patient is bedbound/wheelchair bound at baseline with chronic stage IV decubitus ulcers and chronic osteomyelitis in sacrum, coccyx and ischial tuberosities as seen on CT -Patient also has poor nutritional status, poor mobility  -Wound care and TOC consult appreciated --Colostomy care -- patient is DNR per conversation with [name] walker (POA)    S/P AKA (above knee amputation) (HCC)   PVD (peripheral vascular disease) (HCC)     COPD (chronic obstructive pulmonary disease) (HCC) Recent COVID infection--p asymptomatic--d/c airborne isolation -Not acutely exacerbated -Continue home inhalers with duo nebs as needed   DVT prophylaxis: Lovenox  Code Status: DNR Family Communication:  Teresa walker on the phone 2/25 Disposition Plan:TBD Consults called: none  Status:At the time of admission, it appears that the appropriate admission status for this patient is INPATIENT  Patient being treated for enterococcus sepsis. Continue IV antibiotics. Discussed with POA Galen Daft on the phone. She understands patient has very poor prognosis. Palliative care consult place. Discussed about hospice/comfort care with POA.  TOTAL TIME TAKING CARE OF THIS PATIENT: *25* minutes.  >50% time spent on counselling and coordination of care  Note: This dictation was prepared with Dragon dictation along with smaller phrase technology. Any transcriptional errors that result from this process are unintentional.  Enedina Finner M.D    [facility]   CC: Primary care physician; Eloisa Northern, MDPatient ID: Waldron Session, male   DOB: Nov 25, 1946, 75 y.o.   MRN: [id number]

## 2021-02-10 NOTE — Plan of Care (Signed)
Continuing with plan of care. 

## 2021-02-11 LAB — CBC
HCT: 22.4 % — ABNORMAL LOW (ref 39.0–52.0)
Hemoglobin: 7.2 g/dL — ABNORMAL LOW (ref 13.0–17.0)
MCH: 26.9 pg (ref 26.0–34.0)
MCHC: 32.1 g/dL (ref 30.0–36.0)
MCV: 83.6 fL (ref 80.0–100.0)
Platelets: 163 10*3/uL (ref 150–400)
RBC: 2.68 MIL/uL — ABNORMAL LOW (ref 4.22–5.81)
RDW: 19.9 % — ABNORMAL HIGH (ref 11.5–15.5)
WBC: 12.7 10*3/uL — ABNORMAL HIGH (ref 4.0–10.5)
nRBC: 0.8 % — ABNORMAL HIGH (ref 0.0–0.2)

## 2021-02-11 LAB — CULTURE, BLOOD (ROUTINE X 2)

## 2021-02-11 LAB — GLUCOSE, CAPILLARY
Glucose-Capillary: 77 mg/dL (ref 70–99)
Glucose-Capillary: 122 mg/dL — ABNORMAL HIGH (ref 70–99)
Glucose-Capillary: 110 mg/dL — ABNORMAL HIGH (ref 70–99)

## 2021-02-11 NOTE — Plan of Care (Signed)
Continuing with plan of care. 

## 2021-02-11 NOTE — Progress Notes (Signed)
[facility]  - [place] at [facility]   PATIENT NAME: Blake Villarreal    MR#:  [id number]  DATE OF BIRTH:  1946/08/02  SUBJECTIVE:  patient came in with acute onset confusion and lethargy at [facility]  Awake alert oriented to place and person. Not much appetite today Patient has bedbound due to his severe decubitus. No sob  REVIEW OF SYSTEMS:   Review of Systems  Constitutional: Negative for chills, fever and weight loss.  HENT: Negative for ear discharge, ear pain and nosebleeds.   Eyes: Negative for blurred vision, pain and discharge.  Respiratory: Negative for sputum production, shortness of breath, wheezing and stridor.   Cardiovascular: Negative for chest pain, palpitations, orthopnea and PND.  Gastrointestinal: Negative for abdominal pain, diarrhea, nausea and vomiting.  Genitourinary: Negative for frequency and urgency.  Musculoskeletal: Negative for back pain and joint pain.  Neurological: Positive for weakness. Negative for sensory change, speech change and focal weakness.  Psychiatric/Behavioral: Negative for depression and hallucinations. The patient is not nervous/anxious.    Tolerating Diet:yes Tolerating PT: bedbound  DRUG ALLERGIES:   Allergies  Allergen Reactions  . Daptomycin   . Iodinated Diagnostic Agents     VITALS:  Blood pressure (!) 155/68, pulse 86, temperature 99.2 F (37.3 C), temperature source Oral, resp. rate 18, height 5\' 9"  (1.753 m), weight 54 kg, SpO2 100 %.  PHYSICAL EXAMINATION:   Physical Exam  GENERAL:  75 y.o.-year-old patient lying in the bed with no acute distress. Chronically ill, malnourished LUNGS: Normal breath sounds bilaterally, no wheezing, rales, rhonchi. No use of accessory muscles of respiration.  CARDIOVASCULAR: S1, S2 normal. No murmurs, rubs, or gallops.  EXTREMITIES: AKA+ NEUROLOGIC: no focal weakness PSYCHIATRIC:  patient is alert and oriented x 2 SKIN:       LABORATORY PANEL:   CBC Recent Labs  Lab 02/11/21 0359  WBC 12.7*  HGB 7.2*  HCT 22.4*  PLT 163    Chemistries  Recent Labs  Lab 02/06/21 1754 02/07/21 0526  NA 138 139  K 4.4 3.9  CL 110 113*  CO2 22 19*  GLUCOSE 72 77  BUN 41* 35*  CREATININE 1.52* 1.01  CALCIUM 8.0* 7.3*  AST 131*  --   ALT 79*  --   ALKPHOS 144*  --   BILITOT 0.6  --    Cardiac Enzymes No results for input(s): TROPONINI in the last 168 hours. RADIOLOGY:  No results found. 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  Subsequently found to have blood sugar of 24, responding to D50 in the ER     Cellulitis  scrotum   Enterococcus sepsis -Patient with AMS, hypoglycemia, hypotension, tachypneic, leukocytosis of 15,000 though lactic acid 1.8. -CT abdomen and pelvis negative for necrotizing infection-Large sacral and bilateral ischial decubitus ulcers. Chronic osteomyelitis of sacrum, coccyx and ischial tuberosities No evidence of necrotizing infection of scrotum-  -Infectious disease consultation appreciated--tough to treat this resistant Sepsis given multitude of co-morbidities. --2/24 on linezolid and Meropenem -- 2/22--Blood culture 4/4 VRE --2/24--repeat BC negative --2/27--Now on IV Meropenem. Discussed with POA 6/4 on the phone regarding wound debridement. Patient overall is declining is a poor  candidate and POA also does not want to pursue it. 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  --cbc 7.2     Decubitus ulcer of sacral region, stage 4 with diversion colostomy (HCC)   Chronic osteomyelitis of sacrum (HCC)   Wheelchair bound   Severe protein-energy malnutrition (HCC)   DNR (do not resuscitate) discussion -Patient is bedbound/wheelchair bound at baseline with chronic stage IV decubitus ulcers and chronic osteomyelitis in sacrum, coccyx and ischial tuberosities as seen on CT -Patient also has poor nutritional status, poor mobility  -Wound care and TOC consult appreciated --Colostomy care -- patient is DNR per conversation with [name] walker (POA)    S/P AKA (above knee amputation) (HCC)   PVD (peripheral vascular disease) (HCC)     COPD (chronic obstructive pulmonary disease) (HCC) Recent COVID infection--p asymptomatic--d/c airborne isolation -Not acutely exacerbated -Continue home inhalers with duo nebs as needed   DVT prophylaxis: Lovenox  Code Status: DNR Family Communication:  Teresa walker on the phone 2/27 Disposition Plan:TBD Consults called: none  Status:At the time of admission, it appears that the appropriate admission status for this patient is INPATIENT  Patient being treated for enterococcus sepsis. Continue IV antibiotics. Discussed with POA Galen Daft on the phone. She understands patient has very poor prognosis. Palliative care consult place. Discussed about hospice/comfort care with POA 2/27-- POA mentioned patient wants to continue IV antibiotics and wants to return back to [facility]. He does not want to consider comfort care  TOTAL TIME TAKING CARE OF THIS PATIENT:  *25* minutes.  >50% time spent on counselling and coordination of care  Note: This dictation was prepared with Dragon dictation along with smaller phrase technology. Any transcriptional errors that result from this process are unintentional.  Enedina Finner M.D    [facility]   CC: Primary care physician; Eloisa Northern, MDPatient ID: Waldron Session, male   DOB: [birth year], 75 y.o.   MRN: [id number]

## 2021-02-12 DIAGNOSIS — R7881 Bacteremia: Secondary | ICD-10-CM | POA: Diagnosis not present

## 2021-02-12 DIAGNOSIS — L8944 Pressure ulcer of contiguous site of back, buttock and hip, stage 4: Secondary | ICD-10-CM | POA: Diagnosis not present

## 2021-02-12 DIAGNOSIS — E162 Hypoglycemia, unspecified: Secondary | ICD-10-CM

## 2021-02-12 DIAGNOSIS — G9341 Metabolic encephalopathy: Secondary | ICD-10-CM | POA: Diagnosis not present

## 2021-02-12 LAB — GLUCOSE, CAPILLARY
Glucose-Capillary: 71 mg/dL (ref 70–99)
Glucose-Capillary: 154 mg/dL — ABNORMAL HIGH (ref 70–99)
Glucose-Capillary: 100 mg/dL — ABNORMAL HIGH (ref 70–99)
Glucose-Capillary: 119 mg/dL — ABNORMAL HIGH (ref 70–99)
Glucose-Capillary: 110 mg/dL — ABNORMAL HIGH (ref 70–99)

## 2021-02-12 LAB — CULTURE, BLOOD (ROUTINE X 2): Special Requests: ADEQUATE

## 2021-02-12 MED ORDER — DAKINS (1/4 STRENGTH) 0.125 % EX SOLN
CUTANEOUS | Status: DC
  Filled 2021-02-12: qty 473

## 2021-02-12 NOTE — Consult Note (Addendum)
WOC Nurse wound follow up Refer to previous progress notes on 2/23.  Pt has known chronic osteomyelitis to Unstageable sacral and bilateral ischial wounds. They have been evaluated by ID team, who agrees with Dakins for topical treatment and debridement of nonviable tissue. Dressing procedure/placement/frequency: Dakins orders continued for bedside nurses to perform: Cleanse wounds to sacrum and bilateral ischial tuberosities with NS.  Apply Dakins moist gauze into wound bed. Cover with dry gauze and secure with ABD pads and tape. Change daily. If aggressive plan of care is desired; please consider a surgical consult for further input of necrotic wounds.  Thank-you, Cammie Mcgee MSN, RN, CWOCN, [place], CNS [phone]

## 2021-02-12 NOTE — TOC Progression Note (Signed)
Transition of Care [facility]) - Progression Note    Patient Details  Name: [name] MRN: [id number] Date of Birth: [birth year]  Transition of Care [facility]) CM/SW Contact  Chapman Fitch, RN Phone Number: 02/12/2021, 2:45 PM  Clinical Narrative:    No plans for comfort care at this time Per MD guardian is not interested in pursing surgical debridement.   Plan for IV antibiotics at [facility] when discharge.   Kenney Houseman at [facility] notified    Expected Discharge Plan: Skilled Nursing Facility Barriers to Discharge: Continued Medical Work up  Expected Discharge Plan and Services Expected Discharge Plan: Skilled Nursing Facility In-house Referral: Clinical Social Work   Post Acute Care Choice: Skilled Nursing Facility Living arrangements for the past 2 months: Skilled Nursing Facility                                       Social Determinants of Health (SDOH) Interventions    Readmission Risk Interventions Readmission Risk Prevention Plan 02/07/2021  Transportation Screening Complete  PCP or Specialist Appt within 3-5 Days Complete  HRI or Home Care Consult Complete  Social Work Consult for Recovery Care Planning/Counseling Complete  Palliative Care Screening Not Applicable  Medication Review Oceanographer) Complete  Some recent data might be hidden

## 2021-02-12 NOTE — Progress Notes (Signed)
[facility]  - [place] at [facility]   PATIENT NAME: Blake Villarreal    MR#:  [id number]  DATE OF BIRTH:  December 03, 1946  SUBJECTIVE:  patient came in with acute onset confusion and lethargy at [facility]  Awake alert oriented to place and person. Eating a little better Patient has bedbound due to his severe decubitus. No sob  REVIEW OF SYSTEMS:   Review of Systems  Constitutional: Negative for chills, fever and weight loss.  HENT: Negative for ear discharge, ear pain and nosebleeds.   Eyes: Negative for blurred vision, pain and discharge.  Respiratory: Negative for sputum production, shortness of breath, wheezing and stridor.   Cardiovascular: Negative for chest pain, palpitations, orthopnea and PND.  Gastrointestinal: Negative for abdominal pain, diarrhea, nausea and vomiting.  Genitourinary: Negative for frequency and urgency.  Musculoskeletal: Negative for back pain and joint pain.  Neurological: Positive for weakness. Negative for sensory change, speech change and focal weakness.  Psychiatric/Behavioral: Negative for depression and hallucinations. The patient is not nervous/anxious.    Tolerating Diet:yes Tolerating PT: bedbound  DRUG ALLERGIES:   Allergies  Allergen Reactions  . Daptomycin   . Iodinated Diagnostic Agents     VITALS:  Blood pressure (!) 124/55, pulse 69, temperature 99 F (37.2 C), temperature source Oral, resp. rate 18, height 5\' 9"  (1.753 m), weight 54 kg, SpO2 100 %.  PHYSICAL EXAMINATION:   Physical Exam  GENERAL:  75 y.o.-year-old patient lying in the bed with no acute distress. Chronically ill, malnourished LUNGS: Normal breath sounds bilaterally, no wheezing, rales, rhonchi. No use of accessory muscles of respiration.  CARDIOVASCULAR: S1, S2 normal. No murmurs, rubs, or gallops.  EXTREMITIES: AKA+ NEUROLOGIC: no focal weakness PSYCHIATRIC:  patient is alert and oriented x 2 SKIN:       LABORATORY PANEL:   CBC Recent Labs  Lab 02/11/21 0359  WBC 12.7*  HGB 7.2*  HCT 22.4*  PLT 163    Chemistries  Recent Labs  Lab 02/06/21 1754 02/07/21 0526  NA 138 139  K 4.4 3.9  CL 110 113*  CO2 22 19*  GLUCOSE 72 77  BUN 41* 35*  CREATININE 1.52* 1.01  CALCIUM 8.0* 7.3*  AST 131*  --   ALT 79*  --   ALKPHOS 144*  --   BILITOT 0.6  --    Cardiac Enzymes No results for input(s): TROPONINI in the last 168 hours. RADIOLOGY:  No results found. 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  Subsequently found to have blood sugar of 24, responding to D50 in the ER     Cellulitis  scrotum   Enterococcus sepsis -Patient with AMS, hypoglycemia, hypotension, tachypneic, leukocytosis of 15,000 though lactic acid 1.8. -CT abdomen and pelvis negative for necrotizing infection-Large sacral and bilateral ischial decubitus ulcers. Chronic osteomyelitis of sacrum, coccyx and ischial tuberosities No evidence of necrotizing infection of scrotum-  -Infectious disease consultation appreciated--tough to treat this resistant Sepsis given multitude of co-morbidities. --2/24 on linezolid and Meropenem -- 2/22--Blood culture 4/4 VRE --2/24--repeat BC negative --2/27--Now on IV Meropenem. Discussed with POA 6/4 on the phone regarding wound debridement. Patient overall is declining is a poor  candidate and POA also does not want to pursue it. She tells patient patient is not ready to give up and wants to continue IV antibiotic and return back to [facility]. I'm not sure if patient  has insight into his medical issues --2/28-- repeat blood culture from 2/24-- negative. Will await ID input regarding IV antibiotics. Place PICC line tomorrow.    Unresponsiveness/Acute metabolic encephalopathy-- resolved -Secondary to hypoglycemia possibly +/-sepsis -At baseline patient is awake and alert and was oriented following commands -Fall and aspiration precautions  Hypoglycemia Diabetes type 2 on insulin -sugars stable now   Chronic anemia -Hemoglobin of 6.3 to 7.8 which is baseline --could be dilutional with fluids received for treatment with sepsis.  --cbc 7.2     Decubitus ulcer of sacral region, stage 4 with diversion colostomy (HCC)   Chronic osteomyelitis of sacrum (HCC)   Wheelchair bound   Severe protein-energy malnutrition (HCC)   DNR (do not resuscitate) discussion -Patient is bedbound/wheelchair bound at baseline with chronic stage IV decubitus ulcers and chronic osteomyelitis in sacrum, coccyx and ischial tuberosities as seen on CT -Patient also has poor nutritional status, poor mobility  -Wound care and TOC consult appreciated --Colostomy care -- patient is DNR per conversation with [name] walker (POA)    S/P AKA (above knee amputation) (HCC)   PVD (peripheral vascular disease) (HCC)     COPD (chronic obstructive pulmonary disease) (HCC) Recent COVID infection--p asymptomatic--d/c airborne isolation -Not acutely exacerbated -Continue home inhalers with duo nebs as needed   DVT prophylaxis: Lovenox  Code Status: DNR Family Communication:  Teresa walker on the phone 2/27 Disposition Plan:TBD Consults called: none  Status:At the time of admission, it appears that the appropriate admission status for this patient is INPATIENT  Patient being treated for enterococcus sepsis. Continue IV antibiotics. Discussed with POA Galen Daft on the phone. She understands patient has very poor prognosis. Palliative care consult place. Discussed about  hospice/comfort care with POA 2/27-- POA mentioned patient wants to continue IV antibiotics and wants to return back to [place] [facility]. He does not want to consider comfort care.  TOTAL TIME TAKING CARE OF THIS PATIENT: *25* minutes.  >50% time spent on counselling and coordination of care  Note: This dictation was prepared with Dragon dictation along with smaller phrase technology. Any transcriptional errors that result from this process are unintentional.  Enedina Finner M.D    [facility]   CC: Primary care physician; Eloisa Northern, MDPatient ID: Blake Villarreal, male   DOB: 04-23-1946, 75 y.o.   MRN: [id number]

## 2021-02-12 NOTE — Progress Notes (Signed)
Date of Admission:  02/06/2021    ID: Blake Villarreal is a 75 y.o. male  Principal Problem:   Cellulitis of scrotum Active Problems:   Chronic anemia   DNR (do not resuscitate) discussion   Decubitus ulcer of sacral region, stage 4 (HCC)   Severe protein-energy malnutrition (HCC)   S/P AKA (above knee amputation) (HCC)   Acute metabolic encephalopathy   Hypoglycemia due to insulin   PVD (peripheral vascular disease) (HCC)   Chronic osteomyelitis of sacrum (HCC)   COPD (chronic obstructive pulmonary disease) (HCC)   Scrotal ulcer   Cellulitis, scrotum   Wheelchair bound   Sepsis (HCC)   Elevated LFTs    Subjective: No specific complaints  Medications:  . vitamin C  1,000 mg Oral Daily  . atorvastatin  40 mg Oral QPM  . carvedilol  6.25 mg Oral BID WC  . cholecalciferol  1,000 Units Oral Daily  . enoxaparin (LOVENOX) injection  40 mg Subcutaneous Q24H  . feeding supplement (NEPRO CARB STEADY)  237 mL Oral TID BM  . ferrous sulfate  325 mg Oral TID WC  . folic acid  1 mg Oral Daily  . gabapentin  100 mg Oral TID  . insulin aspart  0-5 Units Subcutaneous QHS  . insulin aspart  0-9 Units Subcutaneous TID WC  . mirtazapine  7.5 mg Oral QHS  . multivitamin with minerals  1 tablet Oral Daily  . sertraline  50 mg Oral Daily  . sodium hypochlorite   Topical 1 day or 1 dose  . tamsulosin  0.4 mg Oral Daily    Objective: Vital signs in last 24 hours: Temp:  [98.4 F (36.9 C)-99 F (37.2 C)] 98.4 F (36.9 C) (02/28 1534) Pulse Rate:  [69-80] 75 (02/28 1534) Resp:  [18-20] 18 (02/28 1534) BP: (124-154)/(53-64) 154/60 (02/28 1534) SpO2:  [100 %] 100 % (02/28 1534)  PHYSICAL EXAM:  General: Alert, cooperative, no distress, emaciated  Lungs: Clear to auscultation bilaterally. No Wheezing or Rhonchi. No rales. Heart: Regular rate and rhythm, no murmur, rub or gallop. Abdomen: colostomy  Extremities: lower extremities in flexion contracture Rt AKA Sacral decubitus  unstageable Ischial ulcers stage IV Lymph: Cervical, supraclavicular normal. Neurologic: Grossly non-focal  Lab Results Recent Labs    02/11/21 0359  WBC 12.7*  HGB 7.2*  HCT 22.4*   Liver Panel No results for input(s): PROT, ALBUMIN, AST, ALT, ALKPHOS, BILITOT, BILIDIR, IBILI in the last 72 hours. Sedimentation Rate Recent Labs    02/10/21 0613  ESRSEDRATE 89*   C-Reactive Protein Recent Labs    02/10/21 0613  CRP 13.6*    Microbiology:  Studies/Results: No results found.   Assessment/Plan: Polymicrobial bacteremia VRE in 1 out of 4 blood culture ESBL E. coli in both sets Proteus in 1 set Patient is currently on meropenem.  Will give it for a total of 14 days Chronic sacral and bilateral ischial decubitus ulcer with chronic osteomyelitis.  Patient is either in bed or wheelchair bound.  Unable to offload his weight, poor nutrition, current smoker.  All of this is impairing his wound healing.  He is not a candidate for debridement of the wounds.  So antibiotic alone is not going to be helpful.  So we will treat the acute bacteremia for now.  Discussed in great detail with his niece who is his POA. He will get meropenem 1 g IV every 8 hours until 02/21/2021 While on IV antibiotic we will get CBC/CMP every Monday. [facility] to fax the results to me at [phone]. PICC/midline can be removed once antibiotic is completed. If any questions please call [phone].  Acute metabolic encephalopathy due to hypoglycemia has resolved Diabetes mellitus COPD Chronic anemia AKA of the right leg Peripheral vascular disease Current smoker Diverting colostomy Discussed the management with the care team.

## 2021-02-13 ENCOUNTER — Inpatient Hospital Stay: Payer: Self-pay

## 2021-02-13 LAB — GLUCOSE, CAPILLARY
Glucose-Capillary: 104 mg/dL — ABNORMAL HIGH (ref 70–99)
Glucose-Capillary: 158 mg/dL — ABNORMAL HIGH (ref 70–99)

## 2021-02-13 LAB — CULTURE, BLOOD (ROUTINE X 2)
Culture: NO GROWTH
Culture: NO GROWTH
Special Requests: ADEQUATE
Special Requests: ADEQUATE

## 2021-02-13 LAB — CREATININE, SERUM
Creatinine, Ser: 0.73 mg/dL (ref 0.61–1.24)
GFR, Estimated: >60 mL/min (ref >60)

## 2021-02-13 MED ORDER — OXYCODONE HCL 5 MG PO TABS: 5.0000 mg | ORAL_TABLET | Freq: Four times a day (QID) | ORAL | 0 refills | Status: DC | PRN

## 2021-02-13 MED ORDER — CHLORHEXIDINE GLUCONATE CLOTH 2 % EX PADS
6.0000 | MEDICATED_PAD | Freq: Every day | CUTANEOUS | Status: DC
  Administered 2021-02-13: 6 via TOPICAL

## 2021-02-13 MED ORDER — NEPRO/CARBSTEADY PO LIQD: 237.0000 mL | Freq: Three times a day (TID) | ORAL | 0 refills | Status: AC

## 2021-02-13 MED ORDER — SODIUM CHLORIDE 0.9% FLUSH
10.0000 mL | Freq: Two times a day (BID) | INTRAVENOUS | Status: DC
  Administered 2021-02-13: 10 mL

## 2021-02-13 MED ORDER — SODIUM CHLORIDE 0.9% FLUSH: 10.0000 mL | INTRAVENOUS | Status: DC | PRN

## 2021-02-13 MED ORDER — ADULT MULTIVITAMIN W/MINERALS CH: 1.0000 | ORAL_TABLET | Freq: Every day | ORAL | 0 refills | Status: AC

## 2021-02-13 MED ORDER — SODIUM CHLORIDE 0.9 % IV SOLN
1.0000 g | Freq: Three times a day (TID) | INTRAVENOUS | Status: DC
  Filled 2021-02-13 (×4): qty 1

## 2021-02-13 MED ORDER — MEROPENEM IV (FOR PTA / DISCHARGE USE ONLY): 1.0000 g | Freq: Three times a day (TID) | INTRAVENOUS | 0 refills | Status: DC

## 2021-02-13 NOTE — TOC Transition Note (Addendum)
Transition of Care [name]) - CM/SW Discharge Note   Patient Details  Name: Blake Villarreal MRN: [id number] Date of Birth: Aug 11, 1946  Transition of Care [facility]) CM/SW Contact:  Chapman Fitch, RN Phone Number: 02/13/2021, 10:34 AM   Clinical Narrative:    Patient to return to [facility] today POA [name] notified she is in agreement  Per Kenney Houseman at [facility] patient is private pay, she has to work out the IV antibiotics on her end then will let me know when to call EMS  DC summary pending Bedside RN to call report  Signed DNR and EMS packet on chart   Update:  DC info sent in HUB Pending PICC line placement Bedside RN will call EMS transport after PICC line is placed    Final next level of care: Long Term Nursing Home Barriers to Discharge: No Barriers Identified   Patient Goals and CMS Choice     Choice offered to / list presented to : [facility] POA / Guardian  Discharge Placement              Patient [facility] bed [facility] Patient to be transferred to facility by: [facility] Name of family member notified: [name] Patient and family notified of of transfer: 02/13/21  Discharge Plan and Services In-house Referral: Clinical Social Work   Post Acute Care Choice: Skilled Nursing Facility                               Social Determinants of Health (SDOH) Interventions     Readmission Risk Interventions Readmission Risk Prevention Plan 02/13/2021 02/07/2021  Transportation Screening Complete Complete  PCP or Specialist Appt within 3-5 Days (No Data) Complete  HRI or Home Care Consult Complete Complete  Social Work Consult for Recovery Care Planning/Counseling - Complete  Palliative Care Screening Complete Not Applicable  Medication Review Oceanographer) Complete Complete  Some recent data might be hidden

## 2021-02-13 NOTE — Progress Notes (Signed)
Report called to Suszanne Conners at [employer] for transportation.

## 2021-02-13 NOTE — Discharge Summary (Signed)
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 Anemia   . CHF (congestive heart failure) (HCC)   . CKD (chronic kidney disease)   . COPD (chronic obstructive pulmonary disease) (HCC)   . Coronary artery disease   . Diabetes mellitus without complication (HCC)   . Dyspnea   . Hx of AKA (above knee amputation), left (HCC)   . Hypertension   . Intermittent confusion   . PAD (peripheral artery disease) (HCC)   . Pneumonia   . Pressure ulcer, sacrum   . PTSD (post-traumatic stress disorder)     HOSPITAL COURSE:   75 year old chronically ill male nursing home resident, wheelchair/bedbound at baselinewith medical history significant forCOPD, CAD, PVDs/pright AKA, chronic sacral decubitus ulcers with chronic osteomyelitis, chronic anemia and protein calorie malnutrition, hospitalized from 2/7-2/13, with hypoxic respiratory failure believed related to aspiration pneumonia, presenting after being found unresponsive at facility with initiation of CPR. Subsequently found to have blood sugar of 24, responding to D50 in the ER   Polymicrobial sepsis--POA Sepsis improving -Patient with AMS, hypoglycemia, hypotension, tachypneic, leukocytosis of 15,000 though lactic acid 1.8. -CT abdomen and pelvis negative for necrotizing infection-Large sacral and  bilateral ischial decubitus ulcers.Chronic osteomyelitis of sacrum, coccyx and ischial tuberosities No evidence of necrotizing infection of scrotum-  -Infectious disease consultation appreciated--tough to treat this resistant Sepsis given multitude of co-morbidities. --2/24 on linezolid and Meropenem -- 2/22--Blood culture  VRE, ESBL ecoli and Proteus --2/24--repeat BC negative --2/27--Now on IV Meropenem. Discussed with POA Galen Daft on the phone regarding wound debridement. Patient overall is declining is a poor candidate and POA also does not want to pursue it. She tells patient patient is not ready to give up and wants to continue IV antibiotic and return back to [facility]. I'm not sure if patient has insight into his medical issues --2/28-- repeat blood culture from 2/24-- negative. Will await ID input regarding IV antibiotics. Place PICC line tomorrow. --3/1--PICC line to be placed today. Pt will be on IV meropenem till 02/21/2021.  Unresponsiveness/Acute metabolic encephalopathy-- resolved -Secondary to hypoglycemia possibly+/-sepsis -At baseline patient is awake and alertand was oriented following commands -Fall and aspiration precautions  Hypoglycemia Diabetes type 2 on insulin -sugars stable now -cont SSI  Chronic anemia -Hemoglobin of 6.3 to 7.8 which is baseline --could be dilutional with fluids received for treatment with sepsis.  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) Wheelchair bound Severe protein-energy malnutrition (HCC) DNR (do not resuscitate) discussion -Patient is bedbound/wheelchair bound at baseline with chronic stage IV decubitus ulcers and chronic osteomyelitis in sacrum, coccyx and ischial tuberosities as seen on CT -Patient also has poor nutritional status, poor mobility  -Wound care consult appreciated--follow dressing changes pr WOC   --Colostomy care -- patient is DNR per conversation with [name] walker (POA)  S/P AKA (above knee amputation) (HCC) PVD (peripheral vascular disease) (HCC)  COPD (chronic obstructive pulmonary disease) (HCC) Recent COVID infection--p asymptomatic--d/c airborne isolation -Not acutely exacerbated -Continue home inhalers with duo nebs as needed  DVT prophylaxis:Lovenox  Code Status:DNR Family Communication:Teresa walker on the phone3/1 Disposition Plan:TBD Consults called:none Status:At the time of admission, it appears that the appropriate admission status for this patient is INPATIENT  Patient being treated for enterococcus sepsis. Continue IV antibiotics. Discussed with POA Galen Daft on the phone. She understands patient has very poor prognosis. Palliative care consult place. Discussed about hospice/comfort care with POA 2/27-- POA mentioned patient wants to continue IV antibiotics and wants to return back to [facility]. He does not want to consider comfort care. 3/1--pt will d/c to [facility] after PICC line placement. Palliative care to follow at facility CONSULTS OBTAINED:    DRUG ALLERGIES:   Allergies  Allergen Reactions  . Daptomycin   . Iodinated Diagnostic Agents     DISCHARGE MEDICATIONS:   Allergies as of 02/13/2021      Reactions   Daptomycin    Iodinated Diagnostic Agents       Medication List    STOP taking these medications   amLODipine 10 MG tablet Commonly known as: NORVASC   collagenase ointment Commonly known as: SANTYL   insulin glargine 100 UNIT/ML injection Commonly known as: LANTUS   metFORMIN 500 MG tablet Commonly known as: GLUCOPHAGE   pantoprazole 40 MG tablet Commonly known as: PROTONIX   predniSONE 10 MG tablet Commonly known as: DELTASONE     TAKE these medications   acetaminophen 325 MG tablet Commonly known as: TYLENOL Take 650 mg by mouth every 4 (four) hours as needed.   atorvastatin 40 MG  tablet Commonly known as: LIPITOR Take 40 mg by mouth every evening.   carvedilol 6.25 MG tablet Commonly known as: COREG Take 1 tablet (6.25 mg total) by mouth 2 (two) times daily with a meal.   feeding supplement (NEPRO CARB STEADY) Liqd Take 237 mLs by mouth 3 (three) times daily between meals.   ferrous sulfate 75 (15 Fe) MG/ML Soln Commonly known as: FER-IN-SOL Take 4.3 mLs (65 mg of iron total) by mouth 3 (three) times daily with meals.   folic acid 1 MG tablet Commonly known as: FOLVITE Take 1 mg by mouth daily.   gabapentin 100 MG capsule Commonly known as: NEURONTIN Take 100 mg by mouth 3 (three) times daily.   insulin aspart 100 UNIT/ML injection Commonly known as: novoLOG Inject 0-10 Units into the skin 4 (four) times daily -  before meals and at bedtime. Inject per sliding scale 0-59 notify MD 60-150=0, 151-199=2 units; 200-249=4 units; 250-299=6 units; 300-349=8 units; 350-399=10 units [phone] notify MD   meropenem  IVPB Commonly known as: MERREM Inject 1 g into the vein every 8 (eight) hours for 8 days. Indication:  VRE Labs - Once weekly:  CBC/D and BMP, End date 02/21/21 Method of administration: Mini-Bag Plus / Gravity Method of administration may be changed at the discretion of home infusion pharmacist based upon assessment of the patient and/or caregiver's ability to self-administer the medication ordered.   mirtazapine 7.5 MG tablet Commonly known as: REMERON Take 7.5 mg by mouth at bedtime.   multivitamin with minerals Tabs tablet Take 1 tablet by mouth daily.   nystatin 100000 UNIT/ML suspension Commonly known as: MYCOSTATIN Take 5 mLs by mouth 4 (four) times daily.   omeprazole 20 MG capsule Commonly known as: PRILOSEC Take 20 mg by mouth 2 (two) times daily before a meal.  oxyCODONE 5 MG immediate release tablet Commonly known as: Oxy IR/ROXICODONE Take 1 tablet (5 mg total) by mouth every 6 (six) hours as needed for severe pain or  breakthrough pain.   sertraline 50 MG tablet Commonly known as: ZOLOFT Take 50 mg by mouth daily.   tamsulosin 0.4 MG Caps capsule Commonly known as: FLOMAX Take 0.4 mg by mouth daily.   vitamin C 1000 MG tablet Take 1,000 mg by mouth daily.   Vitamin D 125 MCG (5000 UT) Caps Take 1,000 Units by mouth daily.   Zinc-220 220 (50 Zn) MG capsule Generic drug: zinc sulfate Take 220 mg by mouth daily.            Discharge Care Instructions  (From admission, onward)         Start     Ordered   02/13/21 0000  Change dressing on IV access line weekly and PRN  (Home infusion instructions - [facility] )        02/13/21 0931   02/13/21 0000  Discharge wound care:       Comments: Empty pouch when 1/3  to  full of stool/flatus Clean bottom 2-inches of fecal pouch prior to resealing Assist patient in emptying pouch Change pouch twice weekly and PRN.  Write date of pouch application on pouch. Have spare pouch at bedside at all times Use Ostomy Teaching Booklet (provided by [facility] nurse) for teaching/reinforcing patient and caregiver education regarding ostomy Order Supplies: 2 piece 2 3/4" pouch  (Kept on unit)  Barrier ring around stoma.  Cleanse wounds to sacrum and bilateral ischial tuberosities with NS.  Apply Dakins moist gauze into wound bed. Cover with dry gauze and secure with ABD pads and tape. Change daily.   Barrier cream to buttocks and scrotal skin   02/13/21 0939          If you experience worsening of your admission symptoms, develop shortness of breath, life threatening emergency, suicidal or homicidal thoughts you must seek medical attention immediately by calling 911 or calling your MD immediately  if symptoms less severe.  You Must read complete instructions/literature along with all the possible adverse reactions/side effects for all the Medicines you take and that have been prescribed to you. Take any new Medicines after you have completely understood  and accept all the possible adverse reactions/side effects.   Please note  You were cared for by a hospitalist during your hospital stay. If you have any questions about your discharge medications or the care you received while you were in the hospital after you are discharged, you can call the unit and asked to speak with the hospitalist on call if the hospitalist that took care of you is not available. Once you are discharged, your primary care physician will handle any further medical issues. Please note that NO REFILLS for any discharge medications will be authorized once you are discharged, as it is imperative that you return to your primary care physician (or establish a relationship with a primary care physician if you do not have one) for your aftercare needs so that they can reassess your need for medications and monitor your lab values. Today   SUBJECTIVE   No new complaints  VITAL SIGNS:  Blood pressure (!) 138/56, pulse 77, temperature 99.5 F (37.5 C), temperature source Oral, resp. rate 18, height  (1.753 m), weight 54 kg, SpO2 98 %.  I/O:    Intake/Output Summary (Last 24 hours) at 02/13/2021 0940 Last  data filed at 02/13/2021 0500 Gross per 24 hour  Intake 757.5 ml  Output 1000 ml  Net -242.5 ml    PHYSICAL EXAMINATION:  GENERAL:  75 y.o.-year-old patient lying in the bed with no acute distress. malnoursihed LUNGS: Normal breath sounds bilaterally, no wheezing, rales,rhonchi or crepitation. No use of accessory muscles of respiration.  CARDIOVASCULAR: S1, S2 normal. No murmurs, rubs, or gallops.  ABDOMEN: Soft, non-tender, non-distended.colostomy+ EXTREMITIES: No pedal edema, cyanosis, or clubbing.  NEUROLOGIC: non focal PSYCHIATRIC: The patient is alert and oriented x 3.  SKIN:  Pressure Injury 01/23/21 Sacrum Stage 4 - Full thickness tissue loss with exposed bone, tendon or muscle. (Active)  01/23/21   Location: Sacrum  Location Orientation:   Staging: Stage 4  - Full thickness tissue loss with exposed bone, tendon or muscle.  Wound Description (Comments):   Present on Admission: Yes     Pressure Injury 01/23/21 Ischial tuberosity Left Stage 4 - Full thickness tissue loss with exposed bone, tendon or muscle. (Active)  01/23/21   Location: Ischial tuberosity  Location Orientation: Left  Staging: Stage 4 - Full thickness tissue loss with exposed bone, tendon or muscle.  Wound Description (Comments):   Present on Admission: Yes        DATA REVIEW:   CBC  Recent Labs  Lab 02/11/21 0359  WBC 12.7*  HGB 7.2*  HCT 22.4*  PLT 163    Chemistries  Recent Labs  Lab 02/06/21 1754 02/07/21 0526 02/13/21 0537  NA 138 139  --   K 4.4 3.9  --   CL 110 113*  --   CO2 22 19*  --   GLUCOSE 72 77  --   BUN 41* 35*  --   CREATININE 1.52* 1.01 0.73  CALCIUM 8.0* 7.3*  --   AST 131*  --   --   ALT 79*  --   --   ALKPHOS 144*  --   --   BILITOT 0.6  --   --     Microbiology Results   Recent Results (from the past 240 hour(s))  Blood culture (routine x 2)     Status: Abnormal   Collection Time: 02/06/21  6:10 PM   Specimen: BLOOD  Result Value Ref Range Status   Specimen Description   Final    BLOOD BLOOD RIGHT HAND Performed at [facility], [address] [place], [place] [address]    Special Requests   Final    BOTTLES DRAWN AEROBIC AND ANAEROBIC Blood Culture results may not be optimal due to an inadequate volume of blood received in culture bottles Performed at [facility], [address], [place], [place] [address]    Culture  Setup Time   Final    GRAM NEGATIVE RODS AEROBIC BOTTLE ONLY CRITICAL RESULT CALLED TO, READ BACK BY AND VERIFIED WITH: NATHAN BLUE  02/08/21 RH Performed at [facility], [address], [place], [place] [address]    Culture (A)  Final    ESCHERICHIA COLI Confirmed Extended Spectrum Beta-Lactamase Producer (ESBL).  In bloodstream infections from ESBL organisms,  carbapenems are preferred over piperacillin/tazobactam. They are shown to have a lower risk of mortality.    Report Status 02/11/2021 FINAL  Final   Organism ID, Bacteria ESCHERICHIA COLI  Final      Susceptibility   Escherichia coli - MIC*    AMPICILLIN >=32 RESISTANT Resistant     CEFAZOLIN >=64 RESISTANT Resistant     CEFEPIME >=  32 RESISTANT Resistant     CEFTAZIDIME >=64 RESISTANT Resistant     CEFTRIAXONE >=64 RESISTANT Resistant     CIPROFLOXACIN >=4 RESISTANT Resistant     GENTAMICIN >=16 RESISTANT Resistant     IMIPENEM <=0.25 SENSITIVE Sensitive     TRIMETH/SULFA >=320 RESISTANT Resistant     AMPICILLIN/SULBACTAM >=32 RESISTANT Resistant     PIP/TAZO >=128 RESISTANT Resistant     * ESCHERICHIA COLI  Blood Culture ID Panel (Reflexed)     Status: Abnormal   Collection Time: 02/06/21  6:10 PM  Result Value Ref Range Status   Enterococcus faecalis NOT DETECTED NOT DETECTED Final   Enterococcus Faecium NOT DETECTED NOT DETECTED Final   Listeria monocytogenes NOT DETECTED NOT DETECTED Final   Staphylococcus species NOT DETECTED NOT DETECTED Final   Staphylococcus aureus (BCID) NOT DETECTED NOT DETECTED Final   Staphylococcus epidermidis NOT DETECTED NOT DETECTED Final   Staphylococcus lugdunensis NOT DETECTED NOT DETECTED Final   Streptococcus species NOT DETECTED NOT DETECTED Final   Streptococcus agalactiae NOT DETECTED NOT DETECTED Final   Streptococcus pneumoniae NOT DETECTED NOT DETECTED Final   Streptococcus pyogenes NOT DETECTED NOT DETECTED Final   A.calcoaceticus-baumannii NOT DETECTED NOT DETECTED Final   Bacteroides fragilis NOT DETECTED NOT DETECTED Final   Enterobacterales DETECTED (A) NOT DETECTED Final    Comment: Enterobacterales represent a large order of gram negative bacteria, not a single organism.   Enterobacter cloacae complex NOT DETECTED NOT DETECTED Final   Escherichia coli DETECTED (A) NOT DETECTED Final    Comment: CRITICAL RESULT CALLED TO, READ  BACK BY AND VERIFIED WITH: NATHAN BLUE @0029  02/08/21 RH    Klebsiella aerogenes NOT DETECTED NOT DETECTED Final   Klebsiella oxytoca NOT DETECTED NOT DETECTED Final   Klebsiella pneumoniae NOT DETECTED NOT DETECTED Final   Proteus species NOT DETECTED NOT DETECTED Final   Salmonella species NOT DETECTED NOT DETECTED Final   Serratia marcescens NOT DETECTED NOT DETECTED Final   Haemophilus influenzae NOT DETECTED NOT DETECTED Final   Neisseria meningitidis NOT DETECTED NOT DETECTED Final   Pseudomonas aeruginosa NOT DETECTED NOT DETECTED Final   Stenotrophomonas maltophilia NOT DETECTED NOT DETECTED Final   Candida albicans NOT DETECTED NOT DETECTED Final   Candida auris NOT DETECTED NOT DETECTED Final   Candida glabrata NOT DETECTED NOT DETECTED Final   Candida krusei NOT DETECTED NOT DETECTED Final   Candida parapsilosis NOT DETECTED NOT DETECTED Final   Candida tropicalis NOT DETECTED NOT DETECTED Final   Cryptococcus neoformans/gattii NOT DETECTED NOT DETECTED Final   CTX-M ESBL DETECTED (A) NOT DETECTED Final    Comment: CRITICAL RESULT CALLED TO, READ BACK BY AND VERIFIED WITH: NATHAN BLUE @0029  02/08/21 RH (NOTE) Extended spectrum beta-lactamase detected. Recommend a carbapenem as initial therapy.      Carbapenem resistance IMP NOT DETECTED NOT DETECTED Final   Carbapenem resistance KPC NOT DETECTED NOT DETECTED Final   Carbapenem resistance NDM NOT DETECTED NOT DETECTED Final   Carbapenem resist OXA 48 LIKE NOT DETECTED NOT DETECTED Final   Carbapenem resistance VIM NOT DETECTED NOT DETECTED Final    Comment: Performed at [facility], [address] [place], [place] [id number]  Blood culture (routine x 2)     Status: Abnormal   Collection Time: 02/06/21  8:12 PM   Specimen: BLOOD  Result Value Ref Range Status   Specimen Description   Final    BLOOD BLOOD RIGHT FOREARM Performed at [facility], [address] [place], [place]  [address]     Special Requests   Final    BOTTLES DRAWN AEROBIC AND ANAEROBIC Blood Culture adequate volume Performed at [facility], [address] [place], [place] [address]    Culture  Setup Time   Final    GRAM POSITIVE COCCI AEROBIC BOTTLE ONLY GRAM NEGATIVE RODS ANAEROBIC BOTTLE ONLY CRITICAL VALUE NOTED.  VALUE IS CONSISTENT WITH PREVIOUSLY REPORTED AND CALLED VALUE. CRITICAL RESULT CALLED TO, READ BACK BY AND VERIFIED WITH: MORGAN HICKS AT 1327 ON 02/07/21 SNG Performed at [facility], [address] [place], [place] [address]    Culture (A)  Final    VANCOMYCIN RESISTANT ENTEROCOCCUS ISOLATED CORRECTED ON 02/28 AT 1037: PREVIOUSLY REPORTED AS ENTEROCOCCUS FAECALIS VANCOMYCIN RESISTANT ENTEROCOCCUS ISOLATED PROTEUS MIRABILIS    Report Status 02/12/2021 FINAL  Final   Organism ID, Bacteria VANCOMYCIN RESISTANT ENTEROCOCCUS ISOLATED  Final   Organism ID, Bacteria PROTEUS MIRABILIS  Final      Susceptibility   Proteus mirabilis - MIC*    AMPICILLIN >=32 RESISTANT Resistant     CEFAZOLIN 8 SENSITIVE Sensitive     CEFEPIME 0.5 SENSITIVE Sensitive     CEFTAZIDIME <=1 SENSITIVE Sensitive     CEFTRIAXONE <=0.25 SENSITIVE Sensitive     CIPROFLOXACIN >=4 RESISTANT Resistant     GENTAMICIN <=1 SENSITIVE Sensitive     IMIPENEM 2 SENSITIVE Sensitive     TRIMETH/SULFA >=320 RESISTANT Resistant     AMPICILLIN/SULBACTAM 16 INTERMEDIATE Intermediate     PIP/TAZO <=4 SENSITIVE Sensitive     * PROTEUS MIRABILIS   Vancomycin resistant enterococcus isolated - MIC*    AMPICILLIN <=2 SENSITIVE Sensitive     VANCOMYCIN >=32 RESISTANT Resistant     GENTAMICIN SYNERGY RESISTANT Resistant     LINEZOLID 2 SENSITIVE Sensitive     * VANCOMYCIN RESISTANT ENTEROCOCCUS ISOLATED CORRECTED ON 02/28 AT 1037: PREVIOUSLY REPORTED AS ENTEROCOCCUS FAECALIS VANCOMYCIN RESISTANT ENTEROCOCCUS ISOLATED  Blood Culture ID Panel (Reflexed)     Status: Abnormal   Collection Time: 02/06/21  8:12 PM  Result  Value Ref Range Status   Enterococcus faecalis DETECTED (A) NOT DETECTED Final    Comment: CRITICAL RESULT CALLED TO, READ BACK BY AND VERIFIED WITH: MORGAN HICKS AT 1327 ON 02/07/21 SNG    Enterococcus Faecium NOT DETECTED NOT DETECTED Final   Listeria monocytogenes NOT DETECTED NOT DETECTED Final   Staphylococcus species NOT DETECTED NOT DETECTED Final   Staphylococcus aureus (BCID) NOT DETECTED NOT DETECTED Final   Staphylococcus epidermidis NOT DETECTED NOT DETECTED Final   Staphylococcus lugdunensis NOT DETECTED NOT DETECTED Final   Streptococcus species NOT DETECTED NOT DETECTED Final   Streptococcus agalactiae NOT DETECTED NOT DETECTED Final   Streptococcus pneumoniae NOT DETECTED NOT DETECTED Final   Streptococcus pyogenes NOT DETECTED NOT DETECTED Final   A.calcoaceticus-baumannii NOT DETECTED NOT DETECTED Final   Bacteroides fragilis NOT DETECTED NOT DETECTED Final   Enterobacterales NOT DETECTED NOT DETECTED Final   Enterobacter cloacae complex NOT DETECTED NOT DETECTED Final   Escherichia coli NOT DETECTED NOT DETECTED Final   Klebsiella aerogenes NOT DETECTED NOT DETECTED Final   Klebsiella oxytoca NOT DETECTED NOT DETECTED Final   Klebsiella pneumoniae NOT DETECTED NOT DETECTED Final   Proteus species NOT DETECTED NOT DETECTED Final   Salmonella species NOT DETECTED NOT DETECTED Final   Serratia marcescens NOT DETECTED NOT DETECTED Final   Haemophilus influenzae NOT DETECTED NOT DETECTED Final   Neisseria meningitidis NOT DETECTED NOT DETECTED Final   Pseudomonas aeruginosa NOT DETECTED NOT  DETECTED Final   Stenotrophomonas maltophilia NOT DETECTED NOT DETECTED Final   Candida albicans NOT DETECTED NOT DETECTED Final   Candida auris NOT DETECTED NOT DETECTED Final   Candida glabrata NOT DETECTED NOT DETECTED Final   Candida krusei NOT DETECTED NOT DETECTED Final   Candida parapsilosis NOT DETECTED NOT DETECTED Final   Candida tropicalis NOT DETECTED NOT DETECTED Final    Cryptococcus neoformans/gattii NOT DETECTED NOT DETECTED Final   Vancomycin resistance DETECTED (A) NOT DETECTED Final    Comment: CRITICAL RESULT CALLED TO, READ BACK BY AND VERIFIED WITH: MORGAN HICKS AT 1327 ON 02/07/21 SNG Performed at [facility], [address] [place], [place] [address]   CULTURE, BLOOD (ROUTINE X 2) w Reflex to ID Panel     Status: None   Collection Time: 02/08/21 11:37 PM   Specimen: BLOOD  Result Value Ref Range Status   Specimen Description BLOOD BLOOD RIGHT HAND  Final   Special Requests   Final    BOTTLES DRAWN AEROBIC AND ANAEROBIC Blood Culture adequate volume   Culture   Final    NO GROWTH 5 DAYS Performed at [facility], [address] [place], [place] [address]    Report Status 02/13/2021 FINAL  Final  CULTURE, BLOOD (ROUTINE X 2) w Reflex to ID Panel     Status: None   Collection Time: 02/08/21 11:43 PM   Specimen: BLOOD  Result Value Ref Range Status   Specimen Description BLOOD BLOOD LEFT HAND  Final   Special Requests   Final    BOTTLES DRAWN AEROBIC AND ANAEROBIC Blood Culture adequate volume   Culture   Final    NO GROWTH 5 DAYS Performed at [facility], [address] [place], [place] [address]    Report Status 02/13/2021 FINAL  Final    RADIOLOGY:  [place] EKG SITE RITE  Result Date: 02/13/2021 If Site Rite image not attached, placement could not be confirmed due to current cardiac rhythm.    CODE STATUS:     Code Status Orders  (From admission, onward)         Start     Ordered   02/07/21 1507  Do not attempt resuscitation (DNR)  Continuous       Question Answer Comment  In the event of cardiac or respiratory ARREST Do not call a "code blue"   In the event of cardiac or respiratory ARREST Do not perform Intubation, CPR, defibrillation or ACLS   In the event of cardiac or respiratory ARREST Use medication by any route, position, wound care, and other measures to relive pain and suffering.  May use oxygen, suction and manual treatment of airway obstruction as needed for comfort.   Comments spoke with Galen Daft (POA)      02/07/21 1507        Code Status History    Date Active Date Inactive Code Status Order ID Comments User Context   02/07/2021 0203 02/07/2021 1507 Full Code [id number]  Andris Baumann, MD Inpatient   02/06/2021 2225 02/07/2021 0202 Full Code [id number]  Andris Baumann, MD ED   02/06/2021 1833 02/06/2021 2225 DNR [id number]  Willy Eddy, MD ED   01/22/2021 2131 01/28/2021 2107 Full Code [id number]  Zannie Cove, MD Inpatient   05/20/2020 2056 05/25/2020 2323 Full Code [id number]  Mansy, Vernetta Honey, MD ED   08/15/2017 0822 08/22/2017 1820 Full Code [id number]  Milagros Loll, MD ED   Advance Care Planning Activity  TOTAL TIME TAKING CARE OF THIS PATIENT:40 minutes.    Enedina Finner M.D  [facility]    CC: Primary care physician; Eloisa Northern, MD

## 2021-02-13 NOTE — Discharge Instructions (Signed)
Palliative care to follow the facility

## 2021-02-13 NOTE — Progress Notes (Signed)
Peripherally Inserted Central Catheter Placement  The IV Nurse has discussed with the patient and/or persons authorized to consent for the patient, the purpose of this procedure and the potential benefits and risks involved with this procedure.  The benefits include less needle sticks, lab draws from the catheter, and the patient may be discharged home with the catheter. Risks include, but not limited to, infection, bleeding, blood clot (thrombus formation), and puncture of an artery; nerve damage and irregular heartbeat and possibility to perform a PICC exchange if needed/ordered by physician.  Alternatives to this procedure were also discussed.  Bard Power PICC patient education guide, fact sheet on infection prevention and patient information card has been provided to patient /or left at bedside.    PICC Placement Documentation  PICC Single Lumen 02/13/21 PICC Right Basilic 42 cm 0 cm (Active)    Telephone consent signed by Antony Odea 02/13/2021, 11:52 AM

## 2021-02-13 NOTE — Progress Notes (Signed)
Waldron Session to be D/C'd to SNF [facility]) per MD order.  Report given to [facility] at [facility].  Allergies as of 02/13/2021      Reactions   Daptomycin    Iodinated Diagnostic Agents       Medication List    STOP taking these medications   amLODipine 10 MG tablet Commonly known as: NORVASC   collagenase ointment Commonly known as: SANTYL   insulin glargine 100 UNIT/ML injection Commonly known as: LANTUS   metFORMIN 500 MG tablet Commonly known as: GLUCOPHAGE   pantoprazole 40 MG tablet Commonly known as: PROTONIX   predniSONE 10 MG tablet Commonly known as: DELTASONE     TAKE these medications   acetaminophen 325 MG tablet Commonly known as: TYLENOL Take 650 mg by mouth every 4 (four) hours as needed.   atorvastatin 40 MG tablet Commonly known as: LIPITOR Take 40 mg by mouth every evening.   carvedilol 6.25 MG tablet Commonly known as: COREG Take 1 tablet (6.25 mg total) by mouth 2 (two) times daily with a meal.   feeding supplement (NEPRO CARB STEADY) Liqd Take 237 mLs by mouth 3 (three) times daily between meals.   ferrous sulfate 75 (15 Fe) MG/ML Soln Commonly known as: FER-IN-SOL Take 4.3 mLs (65 mg of iron total) by mouth 3 (three) times daily with meals.   folic acid 1 MG tablet Commonly known as: FOLVITE Take 1 mg by mouth daily.   gabapentin 100 MG capsule Commonly known as: NEURONTIN Take 100 mg by mouth 3 (three) times daily.   insulin aspart 100 UNIT/ML injection Commonly known as: novoLOG Inject 0-10 Units into the skin 4 (four) times daily -  before meals and at bedtime. Inject per sliding scale 0-59 notify MD 60-150=0, 151-199=2 units; 200-249=4 units; 250-299=6 units; 300-349=8 units; 350-399=10 units [phone] notify MD   meropenem  IVPB Commonly known as: MERREM Inject 1 g into the vein every 8 (eight) hours for 8 days. Indication:  VRE Labs - Once weekly:  CBC/D and BMP, End date 02/21/21 Method of  administration: Mini-Bag Plus / Gravity Method of administration may be changed at the discretion of home infusion pharmacist based upon assessment of the patient and/or caregiver's ability to self-administer the medication ordered.   mirtazapine 7.5 MG tablet Commonly known as: REMERON Take 7.5 mg by mouth at bedtime.   multivitamin with minerals Tabs tablet Take 1 tablet by mouth daily.   nystatin 100000 UNIT/ML suspension Commonly known as: MYCOSTATIN Take 5 mLs by mouth 4 (four) times daily.   omeprazole 20 MG capsule Commonly known as: PRILOSEC Take 20 mg by mouth 2 (two) times daily before a meal.   oxyCODONE 5 MG immediate release tablet Commonly known as: Oxy IR/ROXICODONE Take 1 tablet (5 mg total) by mouth every 6 (six) hours as needed for severe pain or breakthrough pain.   sertraline 50 MG tablet Commonly known as: ZOLOFT Take 50 mg by mouth daily.   tamsulosin 0.4 MG Caps capsule Commonly known as: FLOMAX Take 0.4 mg by mouth daily.   vitamin C 1000 MG tablet Take 1,000 mg by mouth daily.   Vitamin D 125 MCG (5000 UT) Caps Take 1,000 Units by mouth daily.   Zinc-220 220 (50 Zn) MG capsule Generic drug: zinc sulfate Take 220 mg by mouth daily.            Discharge Care Instructions  (From admission, onward)         Start  Ordered   02/13/21 0000  Change dressing on IV access line weekly and PRN  (Home infusion instructions - [facility] )        02/13/21 0931   02/13/21 0000  Discharge wound care:       Comments: Empty pouch when 1/3  to  full of stool/flatus Clean bottom 2-inches of fecal pouch prior to resealing Assist patient in emptying pouch Change pouch twice weekly and PRN.  Write date of pouch application on pouch. Have spare pouch at bedside at all times Use Ostomy Teaching Booklet (provided by [facility] nurse) for teaching/reinforcing patient and caregiver education regarding ostomy Order Supplies: 2 piece 2 3/4" pouch  (Kept  on unit)  Barrier ring around stoma.  Cleanse wounds to sacrum and bilateral ischial tuberosities with NS.  Apply Dakins moist gauze into wound bed. Cover with dry gauze and secure with ABD pads and tape. Change daily.   Barrier cream to buttocks and scrotal skin   02/13/21 0939          Vitals:   02/13/21 1039 02/13/21 1604  BP: 138/76 (!) 124/56  Pulse: 95 72  Resp: 18 18  Temp: 98.8 F (37.1 C) 99.3 F (37.4 C)  SpO2: 99% 100%    IV catheter discontinued intact. Site without signs and symptoms of complications. Dressing and pressure applied. Pt being d/c with PICC line for long term antibiotics. An After Visit Summary was printed and given to the patient. Patient d/c to SNF via stretcher and D/C  Via [facility].  Rigoberto Noel

## 2021-02-13 NOTE — Progress Notes (Signed)
PMT provider chart reviewed and discussed with Dr. Allena Katz. Patient and POA are not ready for comfort focused pathway. Plan is for discharge back to SNF when medically optimized. Patient will requiring PICC placement and IV antibiotics. Recommend outpatient palliative to follow at SNF. High risk for recurrent hospitalization.   NO CHARGE  Vennie Homans, DNP, FNP-C Palliative Medicine Team  Phone: [phone] Fax: [phone]

## 2021-02-16 ENCOUNTER — Emergency Department: Payer: No Typology Code available for payment source

## 2021-02-16 ENCOUNTER — Other Ambulatory Visit: Payer: Self-pay

## 2021-02-16 ENCOUNTER — Observation Stay
Admission: EM | Admit: 2021-02-16 | Discharge: 2021-02-18 | Disposition: A | Discharge status: Home / Self Care | Payer: No Typology Code available for payment source | Attending: Internal Medicine | Admitting: Internal Medicine

## 2021-02-16 DIAGNOSIS — G9341 Metabolic encephalopathy: Principal | ICD-10-CM | POA: Insufficient documentation

## 2021-02-16 DIAGNOSIS — Z87891 Personal history of nicotine dependence: Secondary | ICD-10-CM | POA: Diagnosis not present

## 2021-02-16 DIAGNOSIS — E1122 Type 2 diabetes mellitus with diabetic chronic kidney disease: Secondary | ICD-10-CM | POA: Insufficient documentation

## 2021-02-16 DIAGNOSIS — M8668 Other chronic osteomyelitis, other site: Secondary | ICD-10-CM | POA: Diagnosis present

## 2021-02-16 DIAGNOSIS — Z79899 Other long term (current) drug therapy: Secondary | ICD-10-CM | POA: Insufficient documentation

## 2021-02-16 DIAGNOSIS — E162 Hypoglycemia, unspecified: Secondary | ICD-10-CM | POA: Diagnosis not present

## 2021-02-16 DIAGNOSIS — I509 Heart failure, unspecified: Secondary | ICD-10-CM | POA: Insufficient documentation

## 2021-02-16 DIAGNOSIS — D631 Anemia in chronic kidney disease: Secondary | ICD-10-CM | POA: Diagnosis not present

## 2021-02-16 DIAGNOSIS — L89154 Pressure ulcer of sacral region, stage 4: Secondary | ICD-10-CM | POA: Diagnosis not present

## 2021-02-16 DIAGNOSIS — E785 Hyperlipidemia, unspecified: Secondary | ICD-10-CM | POA: Diagnosis not present

## 2021-02-16 DIAGNOSIS — J449 Chronic obstructive pulmonary disease, unspecified: Secondary | ICD-10-CM | POA: Diagnosis not present

## 2021-02-16 DIAGNOSIS — D649 Anemia, unspecified: Secondary | ICD-10-CM

## 2021-02-16 DIAGNOSIS — E11649 Type 2 diabetes mellitus with hypoglycemia without coma: Secondary | ICD-10-CM | POA: Diagnosis not present

## 2021-02-16 DIAGNOSIS — N189 Chronic kidney disease, unspecified: Secondary | ICD-10-CM | POA: Insufficient documentation

## 2021-02-16 DIAGNOSIS — Z794 Long term (current) use of insulin: Secondary | ICD-10-CM | POA: Insufficient documentation

## 2021-02-16 DIAGNOSIS — I2510 Atherosclerotic heart disease of native coronary artery without angina pectoris: Secondary | ICD-10-CM | POA: Insufficient documentation

## 2021-02-16 DIAGNOSIS — E43 Unspecified severe protein-calorie malnutrition: Secondary | ICD-10-CM | POA: Diagnosis present

## 2021-02-16 DIAGNOSIS — I130 Hypertensive heart and chronic kidney disease with heart failure and stage 1 through stage 4 chronic kidney disease, or unspecified chronic kidney disease: Secondary | ICD-10-CM | POA: Insufficient documentation

## 2021-02-16 DIAGNOSIS — E876 Hypokalemia: Secondary | ICD-10-CM

## 2021-02-16 DIAGNOSIS — M8660 Other chronic osteomyelitis, unspecified site: Secondary | ICD-10-CM

## 2021-02-16 LAB — CBC
HCT: 29.8 % — ABNORMAL LOW (ref 39.0–52.0)
HCT: 23.8 % — ABNORMAL LOW (ref 39.0–52.0)
Hemoglobin: 9.1 g/dL — ABNORMAL LOW (ref 13.0–17.0)
Hemoglobin: 7.4 g/dL — ABNORMAL LOW (ref 13.0–17.0)
MCH: 27.5 pg (ref 26.0–34.0)
MCH: 26.8 pg (ref 26.0–34.0)
MCHC: 30.5 g/dL (ref 30.0–36.0)
MCHC: 31.1 g/dL (ref 30.0–36.0)
MCV: 90 fL (ref 80.0–100.0)
MCV: 86.2 fL (ref 80.0–100.0)
Platelets: 200 10*3/uL (ref 150–400)
Platelets: 253 10*3/uL (ref 150–400)
RBC: 3.31 MIL/uL — ABNORMAL LOW (ref 4.22–5.81)
RBC: 2.76 MIL/uL — ABNORMAL LOW (ref 4.22–5.81)
RDW: 18 % — ABNORMAL HIGH (ref 11.5–15.5)
RDW: 19.2 % — ABNORMAL HIGH (ref 11.5–15.5)
WBC: 9.4 10*3/uL (ref 4.0–10.5)
WBC: 10.8 10*3/uL — ABNORMAL HIGH (ref 4.0–10.5)
nRBC: 0 % (ref 0.0–0.2)
nRBC: 0 % (ref 0.0–0.2)

## 2021-02-16 LAB — COMPREHENSIVE METABOLIC PANEL
ALT: 35 U/L (ref 0–44)
ALT: 33 U/L (ref 0–44)
AST: 35 U/L (ref 15–41)
AST: 45 U/L — ABNORMAL HIGH (ref 15–41)
Albumin: 1 g/dL — ABNORMAL LOW (ref 3.5–5.0)
Albumin: 1.1 g/dL — ABNORMAL LOW (ref 3.5–5.0)
Alkaline Phosphatase: 88 U/L (ref 38–126)
Alkaline Phosphatase: 80 U/L (ref 38–126)
Anion gap: 6 (ref 5–15)
Anion gap: 23 — ABNORMAL HIGH (ref 5–15)
BUN: 13 mg/dL (ref 8–23)
BUN: 13 mg/dL (ref 8–23)
CO2: 18 mmol/L — ABNORMAL LOW (ref 22–32)
CO2: 20 mmol/L — ABNORMAL LOW (ref 22–32)
Calcium: 6.8 mg/dL — ABNORMAL LOW (ref 8.9–10.3)
Calcium: 5.5 mg/dL — CL (ref 8.9–10.3)
Chloride: 114 mmol/L — ABNORMAL HIGH (ref 98–111)
Chloride: 101 mmol/L (ref 98–111)
Creatinine, Ser: 0.82 mg/dL (ref 0.61–1.24)
Creatinine, Ser: 0.81 mg/dL (ref 0.61–1.24)
GFR, Estimated: >60 mL/min (ref >60)
GFR, Estimated: >60 mL/min (ref >60)
Glucose, Bld: 293 mg/dL — ABNORMAL HIGH (ref 70–99)
Glucose, Bld: 272 mg/dL — ABNORMAL HIGH (ref 70–99)
Potassium: 3.1 mmol/L — ABNORMAL LOW (ref 3.5–5.1)
Potassium: >7.5 mmol/L (ref 3.5–5.1)
Sodium: 138 mmol/L (ref 135–145)
Sodium: 144 mmol/L (ref 135–145)
Total Bilirubin: 0.3 mg/dL (ref 0.3–1.2)
Total Bilirubin: 0.4 mg/dL (ref 0.3–1.2)
Total Protein: 4.2 g/dL — ABNORMAL LOW (ref 6.5–8.1)
Total Protein: 4.1 g/dL — ABNORMAL LOW (ref 6.5–8.1)

## 2021-02-16 LAB — CBC WITH DIFFERENTIAL/PLATELET
Abs Immature Granulocytes: 0.05 10*3/uL (ref 0.00–0.07)
Basophils Absolute: 0 10*3/uL (ref 0.0–0.1)
Basophils Relative: 0 %
Eosinophils Absolute: 0.1 10*3/uL (ref 0.0–0.5)
Eosinophils Relative: 1 %
HCT: 21.2 % — ABNORMAL LOW (ref 39.0–52.0)
Hemoglobin: 6.2 g/dL — ABNORMAL LOW (ref 13.0–17.0)
Immature Granulocytes: 1 %
Lymphocytes Relative: 21 %
Lymphs Abs: 2 10*3/uL (ref 0.7–4.0)
MCH: 25.8 pg — ABNORMAL LOW (ref 26.0–34.0)
MCHC: 29.2 g/dL — ABNORMAL LOW (ref 30.0–36.0)
MCV: 88.3 fL (ref 80.0–100.0)
Monocytes Absolute: 0.5 10*3/uL (ref 0.1–1.0)
Monocytes Relative: 5 %
Neutro Abs: 6.8 10*3/uL (ref 1.7–7.7)
Neutrophils Relative %: 72 %
Platelets: 256 10*3/uL (ref 150–400)
RBC: 2.4 MIL/uL — ABNORMAL LOW (ref 4.22–5.81)
RDW: 20.8 % — ABNORMAL HIGH (ref 11.5–15.5)
Smear Review: NORMAL
WBC: 9.4 10*3/uL (ref 4.0–10.5)
nRBC: 0 % (ref 0.0–0.2)

## 2021-02-16 LAB — BASIC METABOLIC PANEL
Anion gap: 15 (ref 5–15)
Anion gap: 6 (ref 5–15)
BUN: 15 mg/dL (ref 8–23)
BUN: 15 mg/dL (ref 8–23)
CO2: 21 mmol/L — ABNORMAL LOW (ref 22–32)
CO2: 20 mmol/L — ABNORMAL LOW (ref 22–32)
Calcium: 6.6 mg/dL — ABNORMAL LOW (ref 8.9–10.3)
Calcium: 7.5 mg/dL — ABNORMAL LOW (ref 8.9–10.3)
Chloride: 108 mmol/L (ref 98–111)
Chloride: 113 mmol/L — ABNORMAL HIGH (ref 98–111)
Creatinine, Ser: 0.91 mg/dL (ref 0.61–1.24)
Creatinine, Ser: 0.78 mg/dL (ref 0.61–1.24)
GFR, Estimated: >60 mL/min (ref >60)
GFR, Estimated: >60 mL/min (ref >60)
Glucose, Bld: 182 mg/dL — ABNORMAL HIGH (ref 70–99)
Glucose, Bld: 170 mg/dL — ABNORMAL HIGH (ref 70–99)
Potassium: 6.1 mmol/L — ABNORMAL HIGH (ref 3.5–5.1)
Potassium: 3.5 mmol/L (ref 3.5–5.1)
Sodium: 144 mmol/L (ref 135–145)
Sodium: 139 mmol/L (ref 135–145)

## 2021-02-16 LAB — URINALYSIS, COMPLETE (UACMP) WITH MICROSCOPIC
Bilirubin Urine: NEGATIVE
Glucose, UA: 50 mg/dL — AB
Hgb urine dipstick: NEGATIVE
Ketones, ur: NEGATIVE mg/dL
Nitrite: NEGATIVE
Protein, ur: 30 mg/dL — AB
Specific Gravity, Urine: 1.02 (ref 1.005–1.030)
pH: 5 (ref 5.0–8.0)

## 2021-02-16 LAB — GLUCOSE, CAPILLARY
Glucose-Capillary: 11 mg/dL — CL (ref 70–99)
Glucose-Capillary: 124 mg/dL — ABNORMAL HIGH (ref 70–99)
Glucose-Capillary: 210 mg/dL — ABNORMAL HIGH (ref 70–99)

## 2021-02-16 LAB — MAGNESIUM
Magnesium: 1.6 mg/dL — ABNORMAL LOW (ref 1.7–2.4)
Magnesium: 1.9 mg/dL (ref 1.7–2.4)

## 2021-02-16 LAB — LACTIC ACID, PLASMA: Lactic Acid, Venous: 1.5 mmol/L (ref 0.5–1.9)

## 2021-02-16 LAB — TROPONIN I (HIGH SENSITIVITY): Troponin I (High Sensitivity): 14 ng/L (ref <18)

## 2021-02-16 LAB — CBG MONITORING, ED
Glucose-Capillary: 124 mg/dL — ABNORMAL HIGH (ref 70–99)
Glucose-Capillary: 140 mg/dL — ABNORMAL HIGH (ref 70–99)

## 2021-02-16 LAB — PREPARE RBC (CROSSMATCH)

## 2021-02-16 MED ORDER — CARVEDILOL 6.25 MG PO TABS
6.2500 mg | ORAL_TABLET | Freq: Two times a day (BID) | ORAL | Status: DC
  Administered 2021-02-16 – 2021-02-18 (×5): 6.25 mg via ORAL
  Filled 2021-02-16 (×5): qty 1

## 2021-02-16 MED ORDER — FERROUS SULFATE 75 (15 FE) MG/ML PO SOLN
65.0000 mg | Freq: Three times a day (TID) | ORAL | Status: DC
  Administered 2021-02-16 – 2021-02-18 (×7): 65 mg via ORAL
  Filled 2021-02-16 (×3): qty 4.33
  Filled 2021-02-16 (×3): qty 4.3
  Filled 2021-02-16 (×2): qty 4.33
  Filled 2021-02-16: qty 4.3

## 2021-02-16 MED ORDER — MEROPENEM IV (FOR PTA / DISCHARGE USE ONLY): 1.0000 g | Freq: Three times a day (TID) | INTRAVENOUS | Status: DC

## 2021-02-16 MED ORDER — SODIUM CHLORIDE 0.9 % IV SOLN
1.0000 g | Freq: Three times a day (TID) | INTRAVENOUS | Status: DC
  Administered 2021-02-16: 1 g via INTRAVENOUS
  Filled 2021-02-16 (×3): qty 1

## 2021-02-16 MED ORDER — SODIUM CHLORIDE 0.9 % IV SOLN
1.0000 g | Freq: Three times a day (TID) | INTRAVENOUS | Status: DC
  Administered 2021-02-16 – 2021-02-18 (×6): 1 g via INTRAVENOUS
  Filled 2021-02-16 (×10): qty 1

## 2021-02-16 MED ORDER — CHLORHEXIDINE GLUCONATE CLOTH 2 % EX PADS
6.0000 | MEDICATED_PAD | Freq: Every day | CUTANEOUS | Status: DC
  Administered 2021-02-16 – 2021-02-17 (×2): 6 via TOPICAL

## 2021-02-16 MED ORDER — MAGNESIUM SULFATE 2 GM/50ML IV SOLN
2.0000 g | Freq: Once | INTRAVENOUS | Status: AC
  Administered 2021-02-16: 2 g via INTRAVENOUS
  Filled 2021-02-16: qty 50

## 2021-02-16 MED ORDER — MAGNESIUM HYDROXIDE 400 MG/5ML PO SUSP
30.0000 mL | Freq: Every day | ORAL | Status: DC | PRN
  Filled 2021-02-16: qty 30

## 2021-02-16 MED ORDER — MORPHINE SULFATE (PF) 2 MG/ML IV SOLN
2.0000 mg | Freq: Once | INTRAVENOUS | Status: AC
  Administered 2021-02-16: 2 mg via INTRAVENOUS
  Filled 2021-02-16: qty 1

## 2021-02-16 MED ORDER — ASCORBIC ACID 500 MG PO TABS
1000.0000 mg | ORAL_TABLET | Freq: Every day | ORAL | Status: DC
  Administered 2021-02-16: 10:00:00 1000 mg via ORAL
  Filled 2021-02-16: qty 2

## 2021-02-16 MED ORDER — ADULT MULTIVITAMIN W/MINERALS CH
1.0000 | ORAL_TABLET | Freq: Every day | ORAL | Status: DC
  Administered 2021-02-16 – 2021-02-18 (×3): 1 via ORAL
  Filled 2021-02-16 (×3): qty 1

## 2021-02-16 MED ORDER — GABAPENTIN 100 MG PO CAPS
100.0000 mg | ORAL_CAPSULE | Freq: Three times a day (TID) | ORAL | Status: DC
  Administered 2021-02-16 – 2021-02-18 (×7): 100 mg via ORAL
  Filled 2021-02-16 (×7): qty 1

## 2021-02-16 MED ORDER — ZINC SULFATE 220 (50 ZN) MG PO CAPS
220.0000 mg | ORAL_CAPSULE | Freq: Every day | ORAL | Status: DC
  Administered 2021-02-16 – 2021-02-18 (×3): 220 mg via ORAL
  Filled 2021-02-16 (×3): qty 1

## 2021-02-16 MED ORDER — VITAMIN D 25 MCG (1000 UNIT) PO TABS
5000.0000 [IU] | ORAL_TABLET | Freq: Every day | ORAL | Status: DC
  Administered 2021-02-16 – 2021-02-18 (×3): 5000 [IU] via ORAL
  Filled 2021-02-16 (×3): qty 5

## 2021-02-16 MED ORDER — INSULIN ASPART 100 UNIT/ML ~~LOC~~ SOLN: 0.0000 [IU] | Freq: Three times a day (TID) | SUBCUTANEOUS | Status: DC

## 2021-02-16 MED ORDER — SERTRALINE HCL 50 MG PO TABS
50.0000 mg | ORAL_TABLET | Freq: Every day | ORAL | Status: DC
  Administered 2021-02-16 – 2021-02-18 (×3): 50 mg via ORAL
  Filled 2021-02-16 (×2): qty 1

## 2021-02-16 MED ORDER — DAKINS (1/4 STRENGTH) 0.125 % EX SOLN
CUTANEOUS | Status: DC
  Filled 2021-02-16: qty 473

## 2021-02-16 MED ORDER — NYSTATIN 100000 UNIT/GM EX POWD
Freq: Two times a day (BID) | CUTANEOUS | Status: DC
  Filled 2021-02-16 (×2): qty 15

## 2021-02-16 MED ORDER — ATORVASTATIN CALCIUM 20 MG PO TABS
40.0000 mg | ORAL_TABLET | Freq: Every evening | ORAL | Status: DC
  Administered 2021-02-16 – 2021-02-17 (×2): 40 mg via ORAL
  Filled 2021-02-16 (×2): qty 2

## 2021-02-16 MED ORDER — TAMSULOSIN HCL 0.4 MG PO CAPS
0.4000 mg | ORAL_CAPSULE | Freq: Every day | ORAL | Status: DC
  Administered 2021-02-16 – 2021-02-18 (×3): 0.4 mg via ORAL
  Filled 2021-02-16 (×3): qty 1

## 2021-02-16 MED ORDER — TRAZODONE HCL 50 MG PO TABS: 25.0000 mg | ORAL_TABLET | Freq: Every evening | ORAL | Status: DC | PRN

## 2021-02-16 MED ORDER — ACETAMINOPHEN 650 MG RE SUPP: 650.0000 mg | Freq: Four times a day (QID) | RECTAL | Status: DC | PRN

## 2021-02-16 MED ORDER — POTASSIUM CHLORIDE CRYS ER 20 MEQ PO TBCR: 40.0000 meq | EXTENDED_RELEASE_TABLET | Freq: Once | ORAL | Status: DC

## 2021-02-16 MED ORDER — ONDANSETRON HCL 4 MG/2ML IJ SOLN: 4.0000 mg | Freq: Four times a day (QID) | INTRAMUSCULAR | Status: DC | PRN

## 2021-02-16 MED ORDER — DEXTROSE 10 % IV SOLN: INTRAVENOUS | Status: DC

## 2021-02-16 MED ORDER — FOLIC ACID 1 MG PO TABS
1.0000 mg | ORAL_TABLET | Freq: Every day | ORAL | Status: DC
  Administered 2021-02-16 – 2021-02-18 (×3): 1 mg via ORAL
  Filled 2021-02-16 (×4): qty 1

## 2021-02-16 MED ORDER — DEXTROSE 50 % IV SOLN
INTRAVENOUS | Status: AC
  Filled 2021-02-16: qty 50

## 2021-02-16 MED ORDER — NYSTATIN 100000 UNIT/ML MT SUSP
5.0000 mL | Freq: Four times a day (QID) | OROMUCOSAL | Status: DC
  Administered 2021-02-16 – 2021-02-18 (×10): 500000 [IU] via ORAL
  Filled 2021-02-16 (×10): qty 5

## 2021-02-16 MED ORDER — OXYCODONE HCL 5 MG PO TABS
5.0000 mg | ORAL_TABLET | Freq: Four times a day (QID) | ORAL | Status: DC | PRN
  Administered 2021-02-17 – 2021-02-18 (×2): 5 mg via ORAL
  Filled 2021-02-16 (×4): qty 1

## 2021-02-16 MED ORDER — ASCORBIC ACID 500 MG PO TABS
500.0000 mg | ORAL_TABLET | Freq: Two times a day (BID) | ORAL | Status: DC
  Administered 2021-02-16 – 2021-02-18 (×4): 500 mg via ORAL
  Filled 2021-02-16 (×4): qty 1

## 2021-02-16 MED ORDER — ACETAMINOPHEN 325 MG PO TABS: 650.0000 mg | ORAL_TABLET | Freq: Four times a day (QID) | ORAL | Status: DC | PRN

## 2021-02-16 MED ORDER — ONDANSETRON HCL 4 MG PO TABS: 4.0000 mg | ORAL_TABLET | Freq: Four times a day (QID) | ORAL | Status: DC | PRN

## 2021-02-16 MED ORDER — SODIUM CHLORIDE 0.9 % IV SOLN: INTRAVENOUS | Status: DC

## 2021-02-16 MED ORDER — PANTOPRAZOLE SODIUM 40 MG PO TBEC
40.0000 mg | DELAYED_RELEASE_TABLET | Freq: Every day | ORAL | Status: DC
  Administered 2021-02-16 – 2021-02-18 (×3): 40 mg via ORAL
  Filled 2021-02-16 (×3): qty 1

## 2021-02-16 MED ORDER — MIRTAZAPINE 15 MG PO TABS
7.5000 mg | ORAL_TABLET | Freq: Every day | ORAL | Status: DC
  Administered 2021-02-16 – 2021-02-17 (×2): 7.5 mg via ORAL
  Filled 2021-02-16 (×2): qty 1

## 2021-02-16 MED ORDER — ENOXAPARIN SODIUM 40 MG/0.4ML ~~LOC~~ SOLN
40.0000 mg | SUBCUTANEOUS | Status: DC
  Administered 2021-02-16 – 2021-02-18 (×3): 40 mg via SUBCUTANEOUS
  Filled 2021-02-16 (×3): qty 0.4

## 2021-02-16 MED ORDER — POTASSIUM CHLORIDE 10 MEQ/100ML IV SOLN
10.0000 meq | INTRAVENOUS | Status: AC
  Administered 2021-02-16 (×3): 10 meq via INTRAVENOUS
  Filled 2021-02-16: qty 100

## 2021-02-16 MED ORDER — SODIUM CHLORIDE 0.9 % IV SOLN: 10.0000 mL/h | Freq: Once | INTRAVENOUS | Status: DC

## 2021-02-16 MED ORDER — NEPRO/CARBSTEADY PO LIQD
237.0000 mL | Freq: Three times a day (TID) | ORAL | Status: DC
  Administered 2021-02-16 – 2021-02-18 (×7): 237 mL via ORAL

## 2021-02-16 MED ORDER — SODIUM CHLORIDE 0.9% FLUSH: 10.0000 mL | INTRAVENOUS | Status: DC | PRN

## 2021-02-16 NOTE — ED Provider Notes (Addendum)
[facility] Emergency Department Provider Note   ____________________________________________   Event Date/Time   First MD Initiated Contact with Patient 02/16/21 0144     (approximate)  I have reviewed the triage vital signs and the nursing notes.   HISTORY  Chief Complaint Hypoglycemia    HPI Blake Villarreal is a 75 y.o. male with past medical history of COPD, CAD, PAD status post right AKA, and large sacral ulcers with chronic osteomyelitis who presents to the ED for hypoglycemia.  History is limited as patient is disoriented on arrival.  Per EMS, patient was found by staff at [facility] healthcare to be unresponsive.  His initial blood sugar was 55 and nurse at [facility] health care attempted to have patient drink orange juice, however he was not awake enough to swallow.  EMS was called and found patient to have a blood sugar of 27.  He was subsequently given 1 mg of glucagon and 200 mL of D10, after which blood sugar rose to 161.  Patient also became more awake and alert after administration of D10.  On arrival, he speaks in a very low voice and is hard to understand.  Patient recently admitted for sepsis with blood cultures growing VRE, ESBL E. coli, and Proteus.  He has been on IV meropenem since then via right upper extremity PICC line.        Past Medical History:  Diagnosis Date  . Anemia   . CHF (congestive heart failure) (HCC)   . CKD (chronic kidney disease)   . COPD (chronic obstructive pulmonary disease) (HCC)   . Coronary artery disease   . Diabetes mellitus without complication (HCC)   . Dyspnea   . Hx of AKA (above knee amputation), left (HCC)   . Hypertension   . Intermittent confusion   . PAD (peripheral artery disease) (HCC)   . Pneumonia   . Pressure ulcer, sacrum   . PTSD (post-traumatic stress disorder)     Patient Active Problem List   Diagnosis Date Noted  . Acute metabolic encephalopathy 02/06/2021  . Hypoglycemia due to  insulin 02/06/2021  . PVD (peripheral vascular disease) (HCC) 02/06/2021  . Chronic osteomyelitis of sacrum (HCC) 02/06/2021  . COPD (chronic obstructive pulmonary disease) (HCC) 02/06/2021  . Scrotal ulcer 02/06/2021  . Cellulitis of scrotum 02/06/2021  . Cellulitis, scrotum 02/06/2021  . Wheelchair bound 02/06/2021  . Sepsis (HCC) 02/06/2021  . Elevated LFTs 02/06/2021  . AKI (acute kidney injury) (HCC) 01/22/2021  . Acute on chronic respiratory failure with hypoxia (HCC) 01/22/2021  . Decubitus ulcer of sacral region, stage 4 (HCC) 01/22/2021  . Severe protein-energy malnutrition (HCC) 01/22/2021  . S/P AKA (above knee amputation) (HCC) 01/22/2021  . Hypoxia 01/22/2021  . Acute CHF (HCC) 05/20/2020  . Healthcare-associated pneumonia   . DNR (do not resuscitate) discussion   . Palliative care encounter   . Pressure injury of skin 08/16/2017  . Acute encephalopathy   . Chronic anemia 08/15/2017  . Acute respiratory failure [facility])     Past Surgical History:  Procedure Laterality Date  . ABDOMINAL SURGERY    . ABOVE KNEE LEG AMPUTATION    . LEG AMPUTATION ABOVE KNEE Right    PREVIOUS SURGERY    Prior to Admission medications   Medication Sig Start Date End Date Taking? Authorizing Provider  acetaminophen (TYLENOL) 325 MG tablet Take 650 mg by mouth every 4 (four) hours as needed.    [provider]  Ascorbic Acid (VITAMIN C)  1000 MG tablet Take 1,000 mg by mouth daily.    [provider]  atorvastatin (LIPITOR) 40 MG tablet Take 40 mg by mouth every evening.    [provider]  carvedilol (COREG) 6.25 MG tablet Take 1 tablet (6.25 mg total) by mouth 2 (two) times daily with a meal. 01/28/21   Azucena FallenLancaster, William C, MD  Cholecalciferol (VITAMIN D) 125 MCG (5000 UT) CAPS Take 1,000 Units by mouth daily.    [provider]  ferrous sulfate (FER-IN-SOL) 75 (15 Fe) MG/ML SOLN Take 4.3 mLs (65 mg of iron total) by mouth 3 (three) times daily with  meals. 01/28/21 02/27/21  Azucena FallenLancaster, William C, MD  folic acid (FOLVITE) 1 MG tablet Take 1 mg by mouth daily.    [provider]  gabapentin (NEURONTIN) 100 MG capsule Take 100 mg by mouth 3 (three) times daily.    [provider]  insulin aspart (NOVOLOG) 100 UNIT/ML injection Inject 0-10 Units into the skin 4 (four) times daily -  before meals and at bedtime. Inject per sliding scale 0-59 notify MD 60-150=0, 151-199=2 units; 200-249=4 units; 250-299=6 units; 300-349=8 units; 350-399=10 units [phone] notify MD    [provider]  meropenem (MERREM) IVPB Inject 1 g into the vein every 8 (eight) hours for 8 days. Indication:  VRE Labs - Once weekly:  CBC/D and BMP, End date 02/21/21 Method of administration: Mini-Bag Plus / Gravity Method of administration may be changed at the discretion of home infusion pharmacist based upon assessment of the patient and/or caregiver's ability to self-administer the medication ordered. 02/13/21 02/21/21  Enedina FinnerPatel, Sona, MD  mirtazapine (REMERON) 7.5 MG tablet Take 7.5 mg by mouth at bedtime.    [provider]  Multiple Vitamin (MULTIVITAMIN WITH MINERALS) TABS tablet Take 1 tablet by mouth daily. 02/13/21   Enedina FinnerPatel, Sona, MD  Nutritional Supplements (FEEDING SUPPLEMENT, NEPRO CARB STEADY,) LIQD Take 237 mLs by mouth 3 (three) times daily between meals. 02/13/21   Enedina FinnerPatel, Sona, MD  nystatin (MYCOSTATIN) 100000 UNIT/ML suspension Take 5 mLs by mouth 4 (four) times daily.    [provider]  omeprazole (PRILOSEC) 20 MG capsule Take 20 mg by mouth 2 (two) times daily before a meal.    [provider]  oxyCODONE (OXY IR/ROXICODONE) 5 MG immediate release tablet Take 1 tablet (5 mg total) by mouth every 6 (six) hours as needed for severe pain or breakthrough pain. 02/13/21   Enedina FinnerPatel, Sona, MD  sertraline (ZOLOFT) 50 MG tablet Take 50 mg by mouth daily.    [provider]  tamsulosin (FLOMAX) 0.4 MG CAPS capsule Take 0.4 mg by  mouth daily.    [provider]  zinc sulfate (ZINC-220) 220 (50 Zn) MG capsule Take 220 mg by mouth daily.    [provider]    Allergies Daptomycin and Iodinated diagnostic agents  Family History  Problem Relation Age of Onset  . CAD Neg Hx     Social History Social History   Tobacco Use  . Smoking status: Former Smoker    Types: Cigarettes  . Smokeless tobacco: Former Clinical biochemistUser  Vaping Use  . Vaping Use: Never used  Substance Use Topics  . Alcohol use: Never    Comment: Drank heavily in the past per his cousin  . Drug use: Never    Review of Systems Unable to obtain secondary to altered mental status  ____________________________________________   PHYSICAL EXAM:  VITAL SIGNS: ED Triage Vitals  Enc Vitals Group  BP --      Pulse Rate 02/16/21 0144 70     Resp 02/16/21 0144 18     Temp 02/16/21 0144 97.7 F (36.5 C)     Temp Source 02/16/21 0144 Oral     SpO2 02/16/21 0144 100 %     Weight 02/16/21 0141 150 lb 2.1 oz (68.1 kg)     Height 02/16/21 0141 5\' 9"  (1.753 m)     Head Circumference --      Peak Flow --      Pain Score 02/16/21 0140 6     Pain Loc --      Pain Edu? --      Excl. in GC? --     Constitutional: Awake and alert, difficulty verbalizing. Eyes: Conjunctivae are normal.  Pupils equal round and reactive to light bilaterally. Head: Atraumatic. Nose: No congestion/rhinnorhea. Mouth/Throat: Mucous membranes are moist. Neck: Normal ROM Cardiovascular: Normal rate, regular rhythm.  Rales noted to bilateral bases. Respiratory: Normal respiratory effort.  No retractions. Lungs CTAB. Gastrointestinal: Soft and nontender.  Colostomy bag in place with light brown stool.  No distention. Genitourinary: Chronic areas of ulceration along base of scrotum and perineum, extending to sacral area with wet-to-dry dressing in place.  No erythema, warmth, or purulent drainage noted at this time. Musculoskeletal: No lower extremity tenderness  nor edema.  Status post right AKA. Neurologic: Very low speech, moving all extremities spontaneously. Skin:  Skin is warm, dry and intact. No rash noted. Psychiatric: Unable to assess.  ____________________________________________   LABS (all labs ordered are listed, but only abnormal results are displayed)  Labs Reviewed  CBC WITH DIFFERENTIAL/PLATELET - Abnormal; Notable for the following components:      Result Value   RBC 2.40 (*)    Hemoglobin 6.2 (*)    HCT 21.2 (*)    MCH 25.8 (*)    MCHC 29.2 (*)    RDW 20.8 (*)    All other components within normal limits  COMPREHENSIVE METABOLIC PANEL - Abnormal; Notable for the following components:   Potassium 3.1 (*)    Chloride 114 (*)    CO2 18 (*)    Glucose, Bld 293 (*)    Calcium 6.8 (*)    Total Protein 4.2 (*)    Albumin 1.0 (*)    All other components within normal limits  MAGNESIUM - Abnormal; Notable for the following components:   Magnesium 1.6 (*)    All other components within normal limits  CBG MONITORING, ED - Abnormal; Notable for the following components:   Glucose-Capillary 124 (*)    All other components within normal limits  CBG MONITORING, ED - Abnormal; Notable for the following components:   Glucose-Capillary 140 (*)    All other components within normal limits  CULTURE, BLOOD (ROUTINE X 2)  CULTURE, BLOOD (ROUTINE X 2)  LACTIC ACID, PLASMA  LACTIC ACID, PLASMA  URINALYSIS, COMPLETE (UACMP) WITH MICROSCOPIC  TYPE AND SCREEN  PREPARE RBC (CROSSMATCH)  TROPONIN I (HIGH SENSITIVITY)  TROPONIN I (HIGH SENSITIVITY)   ____________________________________________  EKG  ED ECG REPORT I, 04/18/21, the attending physician, personally viewed and interpreted this ECG.   Date: 02/16/2021  EKG Time: 1:43  Rate: 71  Rhythm: normal sinus rhythm  Axis: Normal  Intervals:none  ST&T Change: None   PROCEDURES  Procedure(s) performed (including Critical Care):  .Critical Care Performed by:  04/18/2021, MD Authorized by: Chesley Noon, MD   Critical care provider statement:  Critical care time (minutes):  45   Critical care time was exclusive of:  Separately billable procedures and treating other patients and teaching time   Critical care was necessary to treat or prevent imminent or life-threatening deterioration of the following conditions:  Circulatory failure   Critical care was time spent personally by me on the following activities:  Discussions with consultants, evaluation of patient's response to treatment, examination of patient, ordering and performing treatments and interventions, ordering and review of laboratory studies, ordering and review of radiographic studies, pulse oximetry, re-evaluation of patient's condition, obtaining history from patient or surrogate and review of old charts   I assumed direction of critical care for this patient from another provider in my specialty: no     Care discussed with: admitting provider       ____________________________________________   INITIAL IMPRESSION / ASSESSMENT AND PLAN / ED COURSE       75 year old male with past medical history of COPD, CAD, peripheral arterial disease status post right AKA, chronic sacral ulcers with chronic osteomyelitis, and recent bacteremia currently receiving IV meropenem presents to the ED for altered mental status and hypoglycemia.  Patient's blood glucose is improved on arrival following administration of D10 and glucagon by EMS.  We will continue to trend blood glucose levels and check electrolytes.  Vital signs are reassuring and patient has no focal neurologic deficits on exam.  He is currently receiving IV meropenem and I have a low suspicion for recurrent sepsis but we will screen blood cultures as well as remainder of infectious work-up.  He is a diabetic managed with insulin, and it is possible he has had decreased p.o. intake recently.  If he were to develop recurrent  hypoglycemia, we will start patient on a D10 drip.  I am concerned that he may have aspirated and we may need to hold off on giving him something to eat for now.  Blood sugar remained stable, labs remarkable for mild hypokalemia and hypomagnesemia, which we will replete.  Patient does have large sacral ulcerations with dressing in place, but no evidence of acute infection.  Labs also remarkable for acute on chronic anemia with hemoglobin of 6.2.  Patient is agreeable to blood transfusion, no evidence of bleeding at this time as colostomy bag is filled with light brown stool.  Given hypoglycemia and anemia, case discussed with hospitalist for admission.      ____________________________________________   FINAL CLINICAL IMPRESSION(S) / ED DIAGNOSES  Final diagnoses:  Hypoglycemia  Anemia, unspecified type  Chronic osteomyelitis [facility])     ED Discharge Orders    None       Note:  This document was prepared using Dragon voice recognition software and may include unintentional dictation errors.   Chesley Noon, MD 02/16/21 7416    Chesley Noon, MD 02/16/21 [phone]

## 2021-02-16 NOTE — Consult Note (Signed)
Consultation Note Date: 02/16/2021   Patient Name: Blake Villarreal  DOB: March 02, 1946  MRN: [id number]  Age / Sex: 75 y.o., male  PCP: Blake Northern, MD Referring Physician: Dorcas Carrow, MD  Reason for Consultation: Establishing goals of care  HPI/Patient Profile: Blake Villarreal is a 76 y.o. 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 coli and Proteus infection on IV meropenem via PICC line, presented to the emergency room with acute onset of decreased responsiveness with hypoglycemia at his SNF.  Patient was still disoriented upon arrival.  Clinical Assessment and Goals of Care: Patient is resting in bed.  He is able to tell me that he has 3 children, though Blake Villarreal would be the person to help him make decisions.  He is able to tell me his name, that he is at the hospital, and the year of 2022.   We discussed his status, and he states that he does not want to have a feeding tube placed and his would not want to have interventions such as the dialysis or breathing tube or CPR.  We discussed his wounds and his poor p.o. intake.  He states that he would like to just focus on comfort and dignity for what time he has left on this earth.  Asked him what would happen if he just focused on comfort for what time he has, and he stated he would die.  Called to speak to Blake Villarreal.  Patient was recently admitted in February and I spoke with this patient and family at that time. Papers were completed for Blake Villarreal to be his H POA as patient wanted Blake Villarreal to help him make decisions about care moving forward.  Last admission he was unsure of how he felt about future health care.  Blake Villarreal verbalized frustration at that time, that depending on the day, he would tell her different things.  She  also verbalized frustration that he would say at times he wanted to do everything possible, but would not do the things medical teams suggested.  Smoking is his quality of life, as he pays out-of-pocket to stay at a facility so that he can smoke, when he could live in a facility for free funded by the [name].   She states that she wants him to make his own decisions.  She states that she will speak with him to be sure that a focus on comfort is really what he wants in light of his prior indecisiveness.     SUMMARY OF RECOMMENDATIONS   Patient is very much a candidate for hospice if after he speaks with Blake Villarreal he ultimately chooses this direction.  If patient decides against hospice, would recommend outpatient palliative.  Prognosis:   < 6 months      Primary Diagnoses: Present on Admission: . Hypoglycemia . Acute metabolic encephalopathy . Chronic osteomyelitis of sacrum (HCC) . Severe protein-energy malnutrition (HCC) . Decubitus ulcer of sacral  region, stage 4 (HCC)   I have reviewed the medical record, interviewed the patient and family, and examined the patient. The following aspects are pertinent.  Past Medical History:  Diagnosis Date  . Anemia   . CHF (congestive heart failure) (HCC)   . CKD (chronic kidney disease)   . COPD (chronic obstructive pulmonary disease) (HCC)   . Coronary artery disease   . Diabetes mellitus without complication (HCC)   . Dyspnea   . Hx of AKA (above knee amputation), left (HCC)   . Hypertension   . Intermittent confusion   . PAD (peripheral artery disease) (HCC)   . Pneumonia   . Pressure ulcer, sacrum   . PTSD (post-traumatic stress disorder)    Social History   Socioeconomic History  . Marital status: Single    Spouse name: Not on file  . Number of children: Not on file  . Years of education: Not on file  . Highest education level: Not on file  Occupational History  . Not on file  Tobacco Use  . Smoking status: Former Smoker     Types: Cigarettes  . Smokeless tobacco: Former Clinical biochemist  . Vaping Use: Never used  Substance and Sexual Activity  . Alcohol use: Never    Comment: Drank heavily in the past per his cousin  . Drug use: Never  . Sexual activity: Not Currently  Other Topics Concern  . Not on file  Social History Narrative   ** Merged History Encounter **       Social Determinants of Health   Financial Resource Strain: Not on file  Food Insecurity: Not on file  Transportation Needs: Not on file  Physical Activity: Not on file  Stress: Not on file  Social Connections: Not on file   Family History  Problem Relation Age of Onset  . CAD Neg Hx    Scheduled Meds: . vitamin C  1,000 mg Oral Daily  . atorvastatin  40 mg Oral QPM  . carvedilol  6.25 mg Oral BID WC  . Chlorhexidine Gluconate Cloth  6 each Topical Daily  . cholecalciferol  5,000 Units Oral Daily  . enoxaparin (LOVENOX) injection  40 mg Subcutaneous Q24H  . feeding supplement (NEPRO CARB STEADY)  237 mL Oral TID BM  . ferrous sulfate  65 mg of iron Oral TID WC  . folic acid  1 mg Oral Daily  . gabapentin  100 mg Oral TID  . mirtazapine  7.5 mg Oral QHS  . multivitamin with minerals  1 tablet Oral Daily  . nystatin  5 mL Oral QID  . pantoprazole  40 mg Oral Daily  . sertraline  50 mg Oral Daily  . sodium hypochlorite   Topical 1 day or 1 dose  . tamsulosin  0.4 mg Oral Daily  . zinc sulfate  220 mg Oral Daily   Continuous Infusions: . sodium chloride Stopped (02/16/21 0351)  . dextrose 50 mL/hr at 02/16/21 1012  . meropenem (MERREM) IV     PRN Meds:.acetaminophen **OR** acetaminophen, magnesium hydroxide, ondansetron **OR** ondansetron (ZOFRAN) IV, oxyCODONE, sodium chloride flush, traZODone Medications Prior to Admission:  Prior to Admission medications   Medication Sig Start Date End Date Taking? Authorizing Provider  acetaminophen (TYLENOL) 325 MG tablet Take 650 mg by mouth every 4 (four) hours as needed.     [provider]  Ascorbic Acid (VITAMIN C) 1000 MG tablet Take 1,000 mg by mouth daily.    [provider]  atorvastatin (LIPITOR) 40 MG tablet Take 40 mg by mouth every evening.    [provider]  carvedilol (COREG) 6.25 MG tablet Take 1 tablet (6.25 mg total) by mouth 2 (two) times daily with a meal. 01/28/21   Azucena FallenLancaster, William C, MD  Cholecalciferol (VITAMIN D) 125 MCG (5000 UT) CAPS Take 1,000 Units by mouth daily.    [provider]  ferrous sulfate (FER-IN-SOL) 75 (15 Fe) MG/ML SOLN Take 4.3 mLs (65 mg of iron total) by mouth 3 (three) times daily with meals. 01/28/21 02/27/21  Azucena FallenLancaster, William C, MD  folic acid (FOLVITE) 1 MG tablet Take 1 mg by mouth daily.    [provider]  gabapentin (NEURONTIN) 100 MG capsule Take 100 mg by mouth 3 (three) times daily.    [provider]  insulin aspart (NOVOLOG) 100 UNIT/ML injection Inject 0-10 Units into the skin 4 (four) times daily -  before meals and at bedtime. Inject per sliding scale 0-59 notify MD 60-150=0, 151-199=2 units; 200-249=4 units; 250-299=6 units; 300-349=8 units; 350-399=10 units [phone] notify MD    [provider]  meropenem (MERREM) IVPB Inject 1 g into the vein every 8 (eight) hours for 8 days. Indication:  VRE Labs - Once weekly:  CBC/D and BMP, End date 02/21/21 Method of administration: Mini-Bag Plus / Gravity Method of administration may be changed at the discretion of home infusion pharmacist based upon assessment of the patient and/or caregiver's ability to self-administer the medication ordered. 02/13/21 02/21/21  Enedina FinnerPatel, Sona, MD  mirtazapine (REMERON) 7.5 MG tablet Take 7.5 mg by mouth at bedtime.    [provider]  Multiple Vitamin (MULTIVITAMIN WITH MINERALS) TABS tablet Take 1 tablet by mouth daily. 02/13/21   Enedina FinnerPatel, Sona, MD  Nutritional Supplements (FEEDING SUPPLEMENT, NEPRO CARB STEADY,) LIQD Take 237 mLs by mouth 3 (three) times daily between  meals. 02/13/21   Enedina FinnerPatel, Sona, MD  nystatin (MYCOSTATIN) 100000 UNIT/ML suspension Take 5 mLs by mouth 4 (four) times daily.    [provider]  omeprazole (PRILOSEC) 20 MG capsule Take 20 mg by mouth 2 (two) times daily before a meal.    [provider]  oxyCODONE (OXY IR/ROXICODONE) 5 MG immediate release tablet Take 1 tablet (5 mg total) by mouth every 6 (six) hours as needed for severe pain or breakthrough pain. 02/13/21   Enedina FinnerPatel, Sona, MD  sertraline (ZOLOFT) 50 MG tablet Take 50 mg by mouth daily.    [provider]  tamsulosin (FLOMAX) 0.4 MG CAPS capsule Take 0.4 mg by mouth daily.    [provider]  zinc sulfate (ZINC-220) 220 (50 Zn) MG capsule Take 220 mg by mouth daily.    [provider]   Allergies  Allergen Reactions  . Daptomycin   . Iodinated Diagnostic Agents    Review of Systems  Skin:       Complains of pain in his " bottom".    Physical Exam Pulmonary:     Effort: Pulmonary effort is normal.  Skin:    General: Skin is warm and dry.  Neurological:     Mental Status: He is alert.     Vital Signs: BP (!) 166/72 (BP Location: Left Arm)   Pulse 62   Temp 97.6 F (36.4 C) (Oral)   Resp 15   Ht 5\' 9"  (1.753 m)   Wt 68.1 kg   SpO2 100%   BMI 22.17 kg/m  Pain Scale: Faces   Pain Score: 6  SpO2: SpO2: 100 % O2 Device:SpO2: 100 % O2 Flow Rate: .   IO: Intake/output summary:   Intake/Output Summary (Last 24 hours) at 02/16/2021 1224 Last data filed at 02/16/2021 0644 Gross per 24 hour  Intake 1430 ml  Output -  Net 1430 ml    LBM:   Baseline Weight: Weight: 68.1 kg Most recent weight: Weight: 68.1 kg       Time In: 11:00 Time Out: 11:30 Time Total: 30 min Greater than 50%  of this time was spent counseling and coordinating care related to the above assessment and plan.  Signed by: Morton Stall, NP   Please contact Palliative Medicine Team phone at [phone] for questions and concerns.  For  individual provider: See Loretha Stapler

## 2021-02-16 NOTE — Progress Notes (Signed)
Patient's POA [name] notified via phone of patient's admission to unit and POC.

## 2021-02-16 NOTE — ED Notes (Signed)
Pt's wound dressing changed at this time.  Wet to dry dressings applied to pts left buttock and sacral area and covered with ABD dressing.  Pt noted to have multiple unstageable pressure would to buttocks and sacral areas.

## 2021-02-16 NOTE — Progress Notes (Signed)
Dressing changes performed on patient via orders. Wounds as follows:  Sacrum:    11x 9x 3.5 cm Stage IV     Wound bed red and yellow in color with tendon visible     Moderate amounts of purulent drainage noted  Left Buttock:      10x 7x 3.5 cm Stage IV      Wound bed red and yellow in color with tendon visible      Moderate amounts of purulent drainage noted  Right Buttock:      11x 8x 0 cm Unstageable       Wound bed eschar only       No drainage noted  Penis:       Multiple Areas of unstageable wounds with purulent drainage  Scrotum:        Multiple areas of unstageable wounds with purulent drainage

## 2021-02-16 NOTE — H&P (Signed)
[facility]   PATIENT NAME: Blake Villarreal    MR#:  [id number]  DATE OF BIRTH:  1946-09-20  DATE OF ADMISSION:  02/16/2021  PRIMARY CARE PHYSICIAN: Eloisa Northern, MD   Patient is coming from: [facility] REQUESTING/REFERRING PHYSICIAN: Chesley Noon, MD  CHIEF COMPLAINT:   Chief Complaint  Patient presents with  . Hypoglycemia    HISTORY OF PRESENT ILLNESS:  Blake Villarreal is a 75 y.o. 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 coli and Proteus infection on IV meropenem via PICC line, presented to the emergency room with acute onset of decreased responsiveness with hypoglycemia at his SNF.  Patient was still disoriented upon arrival.  Apparently was given orange juice by ambulance healthcare skilled nursing facility staff.  His blood glucose was 55 per EMS.  The patient apparently is not awake enough to swallow.  His glucose later on was down to 27.  He was given 1 mg of glucagon and 200 mL of D10 W after which blood glucose was up to 161.  He became more awake and alert after administration of D10.  Upon arrival to the ER however he was still not back to his baseline mental status and difficult to understand.  No reported fever or chills.  No reported cough or wheezing or dyspnea.  No nausea or vomiting or abdominal pain.  No chest pain or palpitations.  No dysuria, oliguria or hematuria or flank pain.  During my interview he was arousable and would answer several questions appropriately.  ED Course: Labs revealed hypokalemia of 3.1 and blood glucose of 293 with magnesium level 1.6.  CBC showed anemia with hemoglobin of 6.2 hematocrit 21.2 compared to 7.2 and 22.4 on 02/11/2021 EKG as reviewed by me :Showed sinus rhythm with rate of 71 with PACs and poor R wave progression with probable left atrial enlargement. Imaging: Chest  x-ray showed no acute cardiopulmonary disease..  The patient was given 2 g of IV magnesium sulfate and 10 mEq of IV potassium chloride.  He was typed and crossmatched and will be transfused 1 unit of packed red blood cells.  He will be admitted to an observation medical monitored bed for further evaluation and management. PAST MEDICAL HISTORY:   Past Medical History:  Diagnosis Date  . Anemia   . CHF (congestive heart failure) (HCC)   . CKD (chronic kidney disease)   . COPD (chronic obstructive pulmonary disease) (HCC)   . Coronary artery disease   . Diabetes mellitus without complication (HCC)   . Dyspnea   . Hx of AKA (above knee amputation), left (HCC)   . Hypertension   . Intermittent confusion   . PAD (peripheral artery disease) (HCC)   . Pneumonia   . Pressure ulcer, sacrum   . PTSD (post-traumatic stress disorder)     PAST SURGICAL HISTORY:   Past Surgical History:  Procedure Laterality Date  . ABDOMINAL SURGERY    . ABOVE KNEE LEG AMPUTATION    . LEG AMPUTATION ABOVE KNEE Right    PREVIOUS SURGERY    SOCIAL HISTORY:   Social History   Tobacco Use  . Smoking status: Former Smoker    Types: Cigarettes  . Smokeless tobacco: Former Engineer, water Use Topics  . Alcohol use: Never    Comment: Drank heavily in the past per his cousin  FAMILY HISTORY:   Family History  Problem Relation Age of Onset  . CAD Neg Hx     DRUG ALLERGIES:   Allergies  Allergen Reactions  . Daptomycin   . Iodinated Diagnostic Agents     REVIEW OF SYSTEMS:   ROS As per history of present illness. All pertinent systems were reviewed above. Constitutional, HEENT, cardiovascular, respiratory, GI, GU, musculoskeletal, neuro, psychiatric, endocrine, integumentary and hematologic systems were reviewed and are otherwise negative/unremarkable except for positive findings mentioned above in the HPI.   MEDICATIONS AT HOME:   Prior to Admission medications   Medication Sig Start  Date End Date Taking? Authorizing Provider  acetaminophen (TYLENOL) 325 MG tablet Take 650 mg by mouth every 4 (four) hours as needed.    [provider]  Ascorbic Acid (VITAMIN C) 1000 MG tablet Take 1,000 mg by mouth daily.    [provider]  atorvastatin (LIPITOR) 40 MG tablet Take 40 mg by mouth every evening.    [provider]  carvedilol (COREG) 6.25 MG tablet Take 1 tablet (6.25 mg total) by mouth 2 (two) times daily with a meal. 01/28/21   Azucena Fallen, MD  Cholecalciferol (VITAMIN D) 125 MCG (5000 UT) CAPS Take 1,000 Units by mouth daily.    [provider]  ferrous sulfate (FER-IN-SOL) 75 (15 Fe) MG/ML SOLN Take 4.3 mLs (65 mg of iron total) by mouth 3 (three) times daily with meals. 01/28/21 02/27/21  Azucena Fallen, MD  folic acid (FOLVITE) 1 MG tablet Take 1 mg by mouth daily.    [provider]  gabapentin (NEURONTIN) 100 MG capsule Take 100 mg by mouth 3 (three) times daily.    [provider]  insulin aspart (NOVOLOG) 100 UNIT/ML injection Inject 0-10 Units into the skin 4 (four) times daily -  before meals and at bedtime. Inject per sliding scale 0-59 notify MD 60-150=0, 151-199=2 units; 200-249=4 units; 250-299=6 units; 300-349=8 units; 350-399=10 units [phone] notify MD    [provider]  meropenem (MERREM) IVPB Inject 1 g into the vein every 8 (eight) hours for 8 days. Indication:  VRE Labs - Once weekly:  CBC/D and BMP, End date 02/21/21 Method of administration: Mini-Bag Plus / Gravity Method of administration may be changed at the discretion of home infusion pharmacist based upon assessment of the patient and/or caregiver's ability to self-administer the medication ordered. 02/13/21 02/21/21  Enedina Finner, MD  mirtazapine (REMERON) 7.5 MG tablet Take 7.5 mg by mouth at bedtime.    [provider]  Multiple Vitamin (MULTIVITAMIN WITH MINERALS) TABS tablet Take 1 tablet by mouth daily. 02/13/21   Enedina Finner, MD  Nutritional Supplements (FEEDING SUPPLEMENT, NEPRO CARB STEADY,) LIQD Take 237 mLs by mouth 3 (three) times daily between meals. 02/13/21   Enedina Finner, MD  nystatin (MYCOSTATIN) 100000 UNIT/ML suspension Take 5 mLs by mouth 4 (four) times daily.    [provider]  omeprazole (PRILOSEC) 20 MG capsule Take 20 mg by mouth 2 (two) times daily before a meal.    [provider]  oxyCODONE (OXY IR/ROXICODONE) 5 MG immediate release tablet Take 1 tablet (5 mg total) by mouth every 6 (six) hours as needed for severe pain or breakthrough pain. 02/13/21   Enedina Finner, MD  sertraline (ZOLOFT) 50 MG tablet Take 50 mg by mouth daily.    [provider]  tamsulosin (FLOMAX) 0.4 MG CAPS capsule Take 0.4 mg by mouth daily.    [provider]  zinc sulfate (ZINC-220) 220 (50 Zn) MG capsule Take 220 mg by mouth daily.    [provider]      VITAL SIGNS:  Blood pressure (!) 118/51, pulse 64, temperature 97.7 F (36.5 C), temperature source Oral, resp. rate (!) 25, height 5\' 9"  (1.753 m), weight 68.1 kg, SpO2 100 %.  PHYSICAL EXAMINATION:  Physical Exam  GENERAL:  75 y.o.-year-old male patient lying in the bed with no acute distress.  He was somnolent but arousable. EYES: Pupils equal, round, reactive to light and accommodation. No scleral icterus. Extraocular muscles intact.  HEENT: Head atraumatic, normocephalic. Oropharynx and nasopharynx clear.  NECK:  Supple, no jugular venous distention. No thyroid enlargement, no tenderness.  LUNGS: Normal breath sounds bilaterally, no wheezing, rales,rhonchi or crepitation. No use of accessory muscles of respiration.  CARDIOVASCULAR: Regular rate and rhythm, S1, S2 normal. No murmurs, rubs, or gallops.  ABDOMEN: Soft, nondistended, nontender. Bowel sounds present. No organomegaly or mass. Genitourinary: He has chronic ulceration of the base of the scrotum and perineum extending to his sacral area with no erythema,  tenderness or warmth or purulent drainage. EXTREMITIES: No lower extremity edema.  Status post right AKA.   NEUROLOGIC: Cranial nerves II through XII are intact. Muscle strength 5/5 in all extremities. Sensation intact. Gait not checked.  PSYCHIATRIC: The patient is somnolent but arousable and would easily fall asleep.  No good eye contact. SKIN: No obvious rash, lesion, or ulcer.   LABORATORY PANEL:   CBC Recent Labs  Lab 02/16/21 0150  WBC 9.4  HGB 6.2*  HCT 21.2*  PLT 256   ------------------------------------------------------------------------------------------------------------------  Chemistries  Recent Labs  Lab 02/16/21 0150  NA 138  K 3.1*  CL 114*  CO2 18*  GLUCOSE 293*  BUN 13  CREATININE 0.82  CALCIUM 6.8*  MG 1.6*  AST 35  ALT 35  ALKPHOS 88  BILITOT 0.3   ------------------------------------------------------------------------------------------------------------------  Cardiac Enzymes No results for input(s): TROPONINI in the last 168 hours. ------------------------------------------------------------------------------------------------------------------  RADIOLOGY:  DG Chest Portable 1 View  Result Date: 02/16/2021 CLINICAL DATA:  Aspiration EXAM: PORTABLE CHEST 1 VIEW COMPARISON:  02/06/2021 FINDINGS: Lungs are clear. No pneumothorax or pleural effusion. Stable pleural thickening laterally on the left, better seen on CT examination of 08/18/2008. Cardiac size is within normal limits. Right upper extremity PICC line tip noted within the right atrium. No acute bone abnormality. IMPRESSION: No active disease. Electronically Signed   By: 10/18/2008 MD   On: 02/16/2021 02:41      IMPRESSION AND PLAN:  Active Problems:   Hypoglycemia  1.  Hypoglycemia with type 2 diabetes mellitus. -The patient will be admitted to an observation medical monitored bed. -We will place him on supplemental coverage with NovoLog. -We will hold off Lantus. -We will  closely monitor blood glucose levels.  2.  Acute on chronic anemia. -The patient was typed and crossmatch will be transfused 1 unit of packed red blood cells. -We will follow posttransfusion H&H.  3.  Hypokalemia -Potassium was replaced in the ER and will be repeated.  4.  Recent defect large sacral decubitus ulcer with VRE and ESBL E. coli and Proteus. -We will continue IV meropenem as per schedule.  5.  Dyslipidemia. -We will continue statin therapy.  6.  Essential hypertension. -We will continue Coreg.  7.  Peripheral neuropathy. -We will continue Neurontin.  8.  Depression. -We will continue Zoloft.  9.  BPH. -We will continue Flomax.   DVT prophylaxis: Lovenox. Code  Status: full code. Family Communication:  The plan of care was discussed in details with the patient (and family). I answered all questions. The patient agreed to proceed with the above mentioned plan. Further management will depend upon hospital course. Disposition Plan: Back to previous skilled nursing facility environment Consults called: none. All the records are reviewed and case discussed with ED provider.  Status is: Observation  The patient remains OBS appropriate and will d/c before 2 midnights.  Dispo: The patient is from: SNF              Anticipated d/c is to: SNF              Patient currently is not medically stable to d/c.   Difficult to place patient No   TOTAL TIME TAKING CARE OF THIS PATIENT: 55 minutes.    Hannah BeatJan A Meris Reede M.D on 02/16/2021 at 4:33 AM  [facility]   From 7 PM-7 AM, contact night-coverage [email or web address]  CC: Primary care physician; Eloisa NorthernAmin, Saad, MD

## 2021-02-16 NOTE — ED Triage Notes (Signed)
Pt to ED via EMS from [facility] healthcare, pt was found unresponsive by staff a blood sugar was checked and it was 55, the nurse at the facility attempted to give him orange juice while he was unresponsive. Upon EMS arrival to facility cbg 27, pt was given d10 and 1mg  glucagon. pts cbg up to 161 for ems. Pt arrives alert and talking

## 2021-02-16 NOTE — Progress Notes (Signed)
[facility] Hospital Liaison RN note:     This patient has been referred from his facility MD, Dr. Nelson Chimes for outpatient palliative care. Facility aware patient is in the hospital.              We will follow throughout hospitalization for disposition.         Please call with any hospice or palliative related questions.         Thank you for the opportunity to participate in this patients care.  Haynes Bast, BSN, RN [facility] Liaison [phone]

## 2021-02-16 NOTE — Progress Notes (Signed)
Assisted patient to set up lunch tray. Pt states he is able to feed himself. Pt states he does "not want all this treatment" and just "wants to be comfortable." Pt informed POA [name] would be speaking with him later regarding his request. Pt verbalized understanding.

## 2021-02-16 NOTE — Progress Notes (Signed)
Patient received from ER in stable condition. Pt alert to self only.

## 2021-02-16 NOTE — Progress Notes (Signed)
Low air low mattress ordered for patient

## 2021-02-16 NOTE — ED Notes (Signed)
Pt's colostomy bag changed at this time d/t previous bag leaking and being full.

## 2021-02-16 NOTE — Progress Notes (Signed)
PROGRESS NOTE    Blake Villarreal  ONG:[id number] DOB: 24-Jul-1946 DOA: 02/16/2021 PCP: Eloisa Northern, MD    Brief Narrative:  75 year old gentleman with history of COPD, coronary artery disease, type 2 diabetes, hypertension, peripheral arterial disease and large decubitus ulcer with recent VRE, ESBL E. coli and Proteus bacteremia from a nursing home who was recently discharged back to nursing home with PICC line and IV meropenem presented back to the hospital with decreased responsiveness and hypoglycemic event at skilled nursing facility. Third admission since last 1 month with failure to thrive, not eating, blood sugars less than 30. In the emergency room potassium 3.1, blood glucose 293 after resuscitation.  Hemoglobin 6.2.  Given resuscitation with magnesium, potassium, 1 unit of PRBC and dextrose and admitted.   Assessment & Plan:   Principal Problem:   Acute metabolic encephalopathy Active Problems:   Severe protein-energy malnutrition (HCC)   Chronic osteomyelitis of sacrum (HCC)   Hypoglycemia  Acute metabolic encephalopathy due to hypoglycemia: Recurrent hypoglycemia with mental status changes in a patient with underlying advanced debility and failure to thrive.  Patient is only on sliding scale insulin, not on any sulfonylurea at the nursing home.  Will check sulfonylurea. Patient has not been eating or drinking well for many months now. No focal deficit but generalized weakness. See goals of care discussion below.  Acute on chronic anemia of chronic disease: Hemoglobin 6.2.  Transfuse 1 unit of PRBC.  Appropriately responded.  Will monitor.  Hypokalemia and hypomagnesemia: Severe and persistent.  Replaced.  We will recheck.  Sacral decubitus ulcer present on admission.  Osteomyelitis of the sacrum with VRE and ESBL E. coli as well as Proteus bacteremia: Currently on meropenem through the PICC line.  Continue.  Severe protein calorie malnutrition Adult failure to  thrive End-stage decubitus ulcer with sacral osteomyelitis Profound physical debility Recurrent hypoglycemia  Goal of care: Patient is a still not able to have clear conversation.  He has assigned his niece [name] as his healthcare power of attorney.  Mr. [name] was called and updated.  Whole family is aware that he is not having good quality of life, he is not able to keep up with nutrition and suffering.  When patient was awake last week, he desired to be continue be treated but no CPR. We discussed that patient is at this time with end-stage medical conditions and appropriate for hospice.  Family and healthcare power of attorney agreeable. We will continue to treat patient with dextrose infusion in the hope to make him more coherent to keep up conversation. I will consult palliative care team to start hospice conversation.     DVT prophylaxis: enoxaparin (LOVENOX) injection 40 mg Start: 02/16/21 1000   Code Status: DNR Family Communication: Patient's niece Mr. [name] on the phone Disposition Plan: Status is: Observation  The patient will require care spanning > 2 midnights and should be moved to inpatient because: Hemodynamically unstable, Persistent severe electrolyte disturbances and Altered mental status  Dispo: The patient is from: SNF              Anticipated d/c is to: Unknown.              Patient currently is not medically stable to d/c.   Difficult to place patient No         Consultants:   Palliative team  Procedures:   None  Antimicrobials:   None   Subjective: Patient was seen and examined.  He was still in the  emergency room.  He was still not fully coherent but able to keep up minor conversations. Erroneous labs in the morning with very abnormal potassium, hemoglobin and calcium.  Repeating the labs.  We will keep hydration. We will keep on dextrose infusion.  Discontinue all insulin products.  Objective: Vitals:   02/16/21 0509 02/16/21 0530  02/16/21 0644 02/16/21 0924  BP: (!) 111/49 (!) 114/47 134/61 (!) 166/72  Pulse: 64 (!) 58 (!) 57 62  Resp: 13 11 16 15   Temp: (!) 97.5 F (36.4 C)  97.7 F (36.5 C) 97.6 F (36.4 C)  TempSrc: Oral  Oral Oral  SpO2:  100% 100% 100%  Weight:      Height:        Intake/Output Summary (Last 24 hours) at 02/16/2021 1041 Last data filed at 02/16/2021 0644 Gross per 24 hour  Intake 1430 ml  Output --  Net 1430 ml   Filed Weights   02/16/21 0141  Weight: 68.1 kg    Examination:  General exam: Sick looking.  Lethargic.  Somnolent but arousable. Respiratory system: Clear to auscultation. Respiratory effort normal.  No added sounds. Cardiovascular system: S1 & S2 heard, RRR. Gastrointestinal system: Soft and nontender.  Bowel sounds present. Central nervous system: Somnolent.  Generalized weakness. Chronic ulceration at the base of the scrotum and perineum, sacral area, stage IV sacral decubitus ulcer. Right above-knee amputation the stump clean.     Data Reviewed: I have personally reviewed following labs and imaging studies  CBC: Recent Labs  Lab 02/11/21 0359 02/16/21 0150 02/16/21 0600 02/16/21 0907  WBC 12.7* 9.4 9.4 10.8*  NEUTROABS  --  6.8  --   --   HGB 7.2* 6.2* 9.1* 7.4*  HCT 22.4* 21.2* 29.8* 23.8*  MCV 83.6 88.3 90.0 86.2  PLT 163 256 200 253   Basic Metabolic Panel: Recent Labs  Lab 02/13/21 0537 02/16/21 0150 02/16/21 0600 02/16/21 0632  NA  --  138 144 144  K  --  3.1* 6.1* >7.5*  CL  --  114* 108 101  CO2  --  18* 21* 20*  GLUCOSE  --  293* 182* 272*  BUN  --  13 15 13   CREATININE 0.73 0.82 0.91 0.81  CALCIUM  --  6.8* 6.6* 5.5*  MG  --  1.6*  --   --    GFR: Estimated Creatinine Clearance: 75.9 mL/min (by C-G formula based on SCr of 0.81 mg/dL). Liver Function Tests: Recent Labs  Lab 02/16/21 0150 02/16/21 0632  AST 35 45*  ALT 35 33  ALKPHOS 88 80  BILITOT 0.3 0.4  PROT 4.2* 4.1*  ALBUMIN 1.0* 1.1*   No results for input(s):  LIPASE, AMYLASE in the last 168 hours. No results for input(s): AMMONIA in the last 168 hours. Coagulation Profile: No results for input(s): INR, PROTIME in the last 168 hours. Cardiac Enzymes: No results for input(s): CKTOTAL, CKMB, CKMBINDEX, TROPONINI in the last 168 hours. BNP (last 3 results) No results for input(s): PROBNP in the last 8760 hours. HbA1C: No results for input(s): HGBA1C in the last 72 hours. CBG: Recent Labs  Lab 02/13/21 1156 02/16/21 0143 02/16/21 0326 02/16/21 0958 02/16/21 1020  GLUCAP 158* 124* 140* 11* 124*   Lipid Profile: No results for input(s): CHOL, HDL, LDLCALC, TRIG, CHOLHDL, LDLDIRECT in the last 72 hours. Thyroid Function Tests: No results for input(s): TSH, T4TOTAL, FREET4, T3FREE, THYROIDAB in the last 72 hours. Anemia Panel: No results for input(s): VITAMINB12, FOLATE, FERRITIN,  TIBC, IRON, RETICCTPCT in the last 72 hours. Sepsis Labs: Recent Labs  Lab 02/16/21 0151  LATICACIDVEN 1.5    Recent Results (from the past 240 hour(s))  Blood culture (routine x 2)     Status: Abnormal   Collection Time: 02/06/21  6:10 PM   Specimen: BLOOD  Result Value Ref Range Status   Specimen Description   Final    BLOOD BLOOD RIGHT HAND Performed at [facility], [address], [place], [place] [address]    Special Requests   Final    BOTTLES DRAWN AEROBIC AND ANAEROBIC Blood Culture results may not be optimal due to an inadequate volume of blood received in culture bottles Performed at [facility], [address], [place], [place] [address]    Culture  Setup Time   Final    GRAM NEGATIVE RODS AEROBIC BOTTLE ONLY CRITICAL RESULT CALLED TO, READ BACK BY AND VERIFIED WITH: NATHAN BLUE @0029  02/08/21 RH Performed at [facility], [address], [place], [place]    Culture (A)  Final    ESCHERICHIA COLI Confirmed Extended Spectrum Beta-Lactamase Producer (ESBL).  In bloodstream infections from ESBL organisms,  carbapenems are preferred over piperacillin/tazobactam. They are shown to have a lower risk of mortality.    Report Status 02/11/2021 FINAL  Final   Organism ID, Bacteria ESCHERICHIA COLI  Final      Susceptibility   Escherichia coli - MIC*    AMPICILLIN >=32 RESISTANT Resistant     CEFAZOLIN >=64 RESISTANT Resistant     CEFEPIME >=32 RESISTANT Resistant     CEFTAZIDIME >=64 RESISTANT Resistant     CEFTRIAXONE >=64 RESISTANT Resistant     CIPROFLOXACIN >=4 RESISTANT Resistant     GENTAMICIN >=16 RESISTANT Resistant     IMIPENEM <=0.25 SENSITIVE Sensitive     TRIMETH/SULFA >=320 RESISTANT Resistant     AMPICILLIN/SULBACTAM >=32 RESISTANT Resistant     PIP/TAZO >=128 RESISTANT Resistant     * ESCHERICHIA COLI  Blood Culture ID Panel (Reflexed)     Status: Abnormal   Collection Time: 02/06/21  6:10 PM  Result Value Ref Range Status   Enterococcus faecalis NOT DETECTED NOT DETECTED Final   Enterococcus Faecium NOT DETECTED NOT DETECTED Final   Listeria monocytogenes NOT DETECTED NOT DETECTED Final   Staphylococcus species NOT DETECTED NOT DETECTED Final   Staphylococcus aureus (BCID) NOT DETECTED NOT DETECTED Final   Staphylococcus epidermidis NOT DETECTED NOT DETECTED Final   Staphylococcus lugdunensis NOT DETECTED NOT DETECTED Final   Streptococcus species NOT DETECTED NOT DETECTED Final   Streptococcus agalactiae NOT DETECTED NOT DETECTED Final   Streptococcus pneumoniae NOT DETECTED NOT DETECTED Final   Streptococcus pyogenes NOT DETECTED NOT DETECTED Final   A.calcoaceticus-baumannii NOT DETECTED NOT DETECTED Final   Bacteroides fragilis NOT DETECTED NOT DETECTED Final   Enterobacterales DETECTED (A) NOT DETECTED Final    Comment: Enterobacterales represent a large order of gram negative bacteria, not a single organism.   Enterobacter cloacae complex NOT DETECTED NOT DETECTED Final   Escherichia coli DETECTED (A) NOT DETECTED Final    Comment: CRITICAL RESULT CALLED TO, READ  BACK BY AND VERIFIED WITH: NATHAN BLUE @0029  02/08/21 RH    Klebsiella aerogenes NOT DETECTED NOT DETECTED Final   Klebsiella oxytoca NOT DETECTED NOT DETECTED Final   Klebsiella pneumoniae NOT DETECTED NOT DETECTED Final   Proteus species NOT DETECTED NOT DETECTED Final   Salmonella species NOT DETECTED NOT DETECTED Final   Serratia marcescens  NOT DETECTED NOT DETECTED Final   Haemophilus influenzae NOT DETECTED NOT DETECTED Final   Neisseria meningitidis NOT DETECTED NOT DETECTED Final   Pseudomonas aeruginosa NOT DETECTED NOT DETECTED Final   Stenotrophomonas maltophilia NOT DETECTED NOT DETECTED Final   Candida albicans NOT DETECTED NOT DETECTED Final   Candida auris NOT DETECTED NOT DETECTED Final   Candida glabrata NOT DETECTED NOT DETECTED Final   Candida krusei NOT DETECTED NOT DETECTED Final   Candida parapsilosis NOT DETECTED NOT DETECTED Final   Candida tropicalis NOT DETECTED NOT DETECTED Final   Cryptococcus neoformans/gattii NOT DETECTED NOT DETECTED Final   CTX-M ESBL DETECTED (A) NOT DETECTED Final    Comment: CRITICAL RESULT CALLED TO, READ BACK BY AND VERIFIED WITH: NATHAN BLUE @0029  02/08/21 RH (NOTE) Extended spectrum beta-lactamase detected. Recommend a carbapenem as initial therapy.      Carbapenem resistance IMP NOT DETECTED NOT DETECTED Final   Carbapenem resistance KPC NOT DETECTED NOT DETECTED Final   Carbapenem resistance NDM NOT DETECTED NOT DETECTED Final   Carbapenem resist OXA 48 LIKE NOT DETECTED NOT DETECTED Final   Carbapenem resistance VIM NOT DETECTED NOT DETECTED Final    Comment: Performed at [facility], [address] [place], [place]  Blood culture (routine x 2)     Status: Abnormal   Collection Time: 02/06/21  8:12 PM   Specimen: BLOOD  Result Value Ref Range Status   Specimen Description   Final    BLOOD BLOOD RIGHT FOREARM Performed at [facility], [address], [place], [place]     Special Requests   Final    BOTTLES DRAWN AEROBIC AND ANAEROBIC Blood Culture adequate volume Performed at [facility], [address], [place], [place]    Culture  Setup Time   Final    GRAM POSITIVE COCCI AEROBIC BOTTLE ONLY GRAM NEGATIVE RODS ANAEROBIC BOTTLE ONLY CRITICAL VALUE NOTED.  VALUE IS CONSISTENT WITH PREVIOUSLY REPORTED AND CALLED VALUE. CRITICAL RESULT CALLED TO, READ BACK BY AND VERIFIED WITH: MORGAN HICKS AT 1327 ON 02/07/21 SNG Performed at [facility], [address] [place], [place]    Culture (A)  Final    VANCOMYCIN RESISTANT ENTEROCOCCUS ISOLATED CORRECTED ON 02/28 AT 1037: PREVIOUSLY REPORTED AS ENTEROCOCCUS FAECALIS VANCOMYCIN RESISTANT ENTEROCOCCUS ISOLATED PROTEUS MIRABILIS    Report Status 02/12/2021 FINAL  Final   Organism ID, Bacteria VANCOMYCIN RESISTANT ENTEROCOCCUS ISOLATED  Final   Organism ID, Bacteria PROTEUS MIRABILIS  Final      Susceptibility   Proteus mirabilis - MIC*    AMPICILLIN >=32 RESISTANT Resistant     CEFAZOLIN 8 SENSITIVE Sensitive     CEFEPIME 0.5 SENSITIVE Sensitive     CEFTAZIDIME <=1 SENSITIVE Sensitive     CEFTRIAXONE <=0.25 SENSITIVE Sensitive     CIPROFLOXACIN >=4 RESISTANT Resistant     GENTAMICIN <=1 SENSITIVE Sensitive     IMIPENEM 2 SENSITIVE Sensitive     TRIMETH/SULFA >=320 RESISTANT Resistant     AMPICILLIN/SULBACTAM 16 INTERMEDIATE Intermediate     PIP/TAZO <=4 SENSITIVE Sensitive     * PROTEUS MIRABILIS   Vancomycin resistant enterococcus isolated - MIC*    AMPICILLIN <=2 SENSITIVE Sensitive     VANCOMYCIN >=32 RESISTANT Resistant     GENTAMICIN SYNERGY RESISTANT Resistant     LINEZOLID 2 SENSITIVE Sensitive     * VANCOMYCIN RESISTANT ENTEROCOCCUS ISOLATED CORRECTED ON 02/28 AT 1037: PREVIOUSLY REPORTED AS ENTEROCOCCUS FAECALIS VANCOMYCIN RESISTANT ENTEROCOCCUS ISOLATED  Blood Culture  ID Panel (Reflexed)     Status: Abnormal   Collection Time: 02/06/21  8:12 PM  Result  Value Ref Range Status   Enterococcus faecalis DETECTED (A) NOT DETECTED Final    Comment: CRITICAL RESULT CALLED TO, READ BACK BY AND VERIFIED WITH: MORGAN HICKS AT 1327 ON 02/07/21 SNG    Enterococcus Faecium NOT DETECTED NOT DETECTED Final   Listeria monocytogenes NOT DETECTED NOT DETECTED Final   Staphylococcus species NOT DETECTED NOT DETECTED Final   Staphylococcus aureus (BCID) NOT DETECTED NOT DETECTED Final   Staphylococcus epidermidis NOT DETECTED NOT DETECTED Final   Staphylococcus lugdunensis NOT DETECTED NOT DETECTED Final   Streptococcus species NOT DETECTED NOT DETECTED Final   Streptococcus agalactiae NOT DETECTED NOT DETECTED Final   Streptococcus pneumoniae NOT DETECTED NOT DETECTED Final   Streptococcus pyogenes NOT DETECTED NOT DETECTED Final   A.calcoaceticus-baumannii NOT DETECTED NOT DETECTED Final   Bacteroides fragilis NOT DETECTED NOT DETECTED Final   Enterobacterales NOT DETECTED NOT DETECTED Final   Enterobacter cloacae complex NOT DETECTED NOT DETECTED Final   Escherichia coli NOT DETECTED NOT DETECTED Final   Klebsiella aerogenes NOT DETECTED NOT DETECTED Final   Klebsiella oxytoca NOT DETECTED NOT DETECTED Final   Klebsiella pneumoniae NOT DETECTED NOT DETECTED Final   Proteus species NOT DETECTED NOT DETECTED Final   Salmonella species NOT DETECTED NOT DETECTED Final   Serratia marcescens NOT DETECTED NOT DETECTED Final   Haemophilus influenzae NOT DETECTED NOT DETECTED Final   Neisseria meningitidis NOT DETECTED NOT DETECTED Final   Pseudomonas aeruginosa NOT DETECTED NOT DETECTED Final   Stenotrophomonas maltophilia NOT DETECTED NOT DETECTED Final   Candida albicans NOT DETECTED NOT DETECTED Final   Candida auris NOT DETECTED NOT DETECTED Final   Candida glabrata NOT DETECTED NOT DETECTED Final   Candida krusei NOT DETECTED NOT DETECTED Final   Candida parapsilosis NOT DETECTED NOT DETECTED Final   Candida tropicalis NOT DETECTED NOT DETECTED Final    Cryptococcus neoformans/gattii NOT DETECTED NOT DETECTED Final   Vancomycin resistance DETECTED (A) NOT DETECTED Final    Comment: CRITICAL RESULT CALLED TO, READ BACK BY AND VERIFIED WITH: MORGAN HICKS AT 1327 ON 02/07/21 SNG Performed at [facility], [address] [place], [place] [address]   CULTURE, BLOOD (ROUTINE X 2) w Reflex to ID Panel     Status: None   Collection Time: 02/08/21 11:37 PM   Specimen: BLOOD  Result Value Ref Range Status   Specimen Description BLOOD BLOOD RIGHT HAND  Final   Special Requests   Final    BOTTLES DRAWN AEROBIC AND ANAEROBIC Blood Culture adequate volume   Culture   Final    NO GROWTH 5 DAYS Performed at [facility], [address] [place], [place] [address]    Report Status 02/13/2021 FINAL  Final  CULTURE, BLOOD (ROUTINE X 2) w Reflex to ID Panel     Status: None   Collection Time: 02/08/21 11:43 PM   Specimen: BLOOD  Result Value Ref Range Status   Specimen Description BLOOD BLOOD LEFT HAND  Final   Special Requests   Final    BOTTLES DRAWN AEROBIC AND ANAEROBIC Blood Culture adequate volume   Culture   Final    NO GROWTH 5 DAYS Performed at [facility], [address], [place], [place] [address]    Report Status 02/13/2021 FINAL  Final  Culture, blood (routine x 2)     Status: None (Preliminary result)   Collection Time: 02/16/21  1:46 AM   Specimen: BLOOD  Result Value Ref Range Status   Specimen Description BLOOD LEFT ARM  Final   Special Requests   Final    BOTTLES DRAWN AEROBIC AND ANAEROBIC Blood Culture adequate volume   Culture   Final    NO GROWTH < 12 HOURS Performed at [facility], [address], [place], [place] [address]    Report Status PENDING  Incomplete  Culture, blood (routine x 2)     Status: None (Preliminary result)   Collection Time: 02/16/21  2:47 AM   Specimen: BLOOD  Result Value Ref Range Status   Specimen Description BLOOD LEFT ARM  Final   Special  Requests   Final    BOTTLES DRAWN AEROBIC AND ANAEROBIC Blood Culture adequate volume   Culture   Final    NO GROWTH < 12 HOURS Performed at [facility], [address], [place], [place] [address]    Report Status PENDING  Incomplete         Radiology Studies: DG Chest Portable 1 View  Result Date: 02/16/2021 CLINICAL DATA:  Aspiration EXAM: PORTABLE CHEST 1 VIEW COMPARISON:  02/06/2021 FINDINGS: Lungs are clear. No pneumothorax or pleural effusion. Stable pleural thickening laterally on the left, better seen on CT examination of 08/18/2008. Cardiac size is within normal limits. Right upper extremity PICC line tip noted within the right atrium. No acute bone abnormality. IMPRESSION: No active disease. Electronically Signed   By: Helyn Numbers MD   On: 02/16/2021 02:41        Scheduled Meds: . vitamin C  1,000 mg Oral Daily  . atorvastatin  40 mg Oral QPM  . carvedilol  6.25 mg Oral BID WC  . cholecalciferol  5,000 Units Oral Daily  . enoxaparin (LOVENOX) injection  40 mg Subcutaneous Q24H  . feeding supplement (NEPRO CARB STEADY)  237 mL Oral TID BM  . ferrous sulfate  65 mg of iron Oral TID WC  . folic acid  1 mg Oral Daily  . gabapentin  100 mg Oral TID  . mirtazapine  7.5 mg Oral QHS  . multivitamin with minerals  1 tablet Oral Daily  . nystatin  5 mL Oral QID  . pantoprazole  40 mg Oral Daily  . sertraline  50 mg Oral Daily  . tamsulosin  0.4 mg Oral Daily  . zinc sulfate  220 mg Oral Daily   Continuous Infusions: . sodium chloride Stopped (02/16/21 0351)  . dextrose 50 mL/hr at 02/16/21 1012  . meropenem (MERREM) IV       LOS: 0 days    Time spent: Additional 30 minutes    Dorcas Carrow, MD [facility] Pager [phone]

## 2021-02-16 NOTE — Progress Notes (Signed)
Initial Nutrition Assessment  DOCUMENTATION CODES:   Severe malnutrition in context of chronic illness  INTERVENTION:   Nepro Shake po TID, each supplement provides 425 kcal and 19 grams protein  MVI daily   Vitamin C 500mg  po BID   NUTRITION DIAGNOSIS:   Severe Malnutrition related to chronic illness (COPD, chronic wounds) as evidenced by severe fat depletion,severe muscle depletion.  GOAL:   Patient will meet greater than or equal to 90% of their needs  MONITOR:   PO intake,Supplement acceptance,Labs,Weight trends,Skin,I & O's  REASON FOR ASSESSMENT:   Malnutrition Screening Tool    ASSESSMENT:   75 y.o. male nursing home resident with medical history significant for DM 2 on insulin, COPD, CAD, CHF, CKD, PVD s/p right AKA, chronic advanced sacral decubitus ulcers with chronic osteomyelitis and with diversion colostomy, chronic anemia and protein calorie malnutrition, hospitalized from 2/7-2/13, with hypoxic respiratory failure believed related to aspiration pneumonia and COVID 19 who is admitted with hypoglycemia.   RD familiar with this patient from his recent previous admit. Pt is a poor historian but reports poor appetite and oral intake at baseline. Pt does drink vanilla Ensure at home. Recommend continue supplements and vitamins to help pt meet his estimated needs and support wound healing. Pt is at high refeed risk. Per chart, pt is down 55lbs(32%) in 8 months; this is severe weight loss. Palliative care following for GOC; family considering comfort care. Pt reports that he would not want a feeding tube.     Medications reviewed and include: vitamin C, D3, lovenox, ferrous sulfate, folic acid, remeron, MVI, zinc, 10% dextrose 55ml/hr, meropenem   Labs reviewed: K >7.5(H), Ca 5.5(L) adj. 9.02 wnl, alb 1.1(L) Wbc- 10.8(H), Hgb 7.4(L), Hct 23.8(L) cbgs- 124, 140, 11, 124 x 24 hrs AIC 6.2(H)- 2/8  NUTRITION - FOCUSED PHYSICAL EXAM:  Flowsheet Row Most Recent Value   Orbital Region Severe depletion  Upper Arm Region Severe depletion  Thoracic and Lumbar Region Severe depletion  Buccal Region Severe depletion  Temple Region Severe depletion  Clavicle Bone Region Severe depletion  Clavicle and Acromion Bone Region Severe depletion  Scapular Bone Region Severe depletion  Dorsal Hand Severe depletion  Patellar Region Severe depletion  Anterior Thigh Region Severe depletion  Posterior Calf Region Severe depletion  Edema (RD Assessment) None  Hair Reviewed  Eyes Reviewed  Mouth Reviewed  Skin Reviewed  Nails Reviewed     Diet Order:   Diet Order            DIET DYS 3 Room service appropriate? Yes; Fluid consistency: Thin  Diet effective now                EDUCATION NEEDS:   Education needs have been addressed  Skin:  Skin Assessment: Reviewed RN Assessment (sacrum: 8 cm x 6 cm x 5 cm, Left ischium: 8 cm x 4.5 cm x 3, Right ischium: 6 cm x 5 cm x 5 cm)   Last BM:  3/1- via ostomy  Height:   Ht Readings from Last 1 Encounters:  02/16/21 5\' 9"  (1.753 m)    Weight:   Wt Readings from Last 1 Encounters:  02/16/21 68.1 kg    Ideal Body Weight:  72.7 kg  BMI:  Body mass index is 22.17 kg/m.  Estimated Nutritional Needs:   Kcal:  1800-2100kcal/day  Protein:  90-105g/day  Fluid:  1.3-1.6L/day  MS, RD, LDN Please refer to [facility] for RD and/or RD on-call/weekend/after hours pager

## 2021-02-16 NOTE — Consult Note (Addendum)
WOC Nurse Consult Note: Reason for Consult: Consult requested for bilat ischium and sacrum and scrotum wounds.  Pt is familiar to [facility] team from recent admission. Performed consult remotely since the patient was just discharged 3 days ago. Continue with previous plan of care as outlined below. Palliative care met with the patient's family prior to discharge to discuss possible comfort care goals, but they stated they were not ready to make a decision at that time. 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  These wounds have chronic slough/eschar and odor and drainage.  He has patchy areas of maceration and skin loss to scrotum.  Pressure Injury POA: Yes Pt has a colostomy and instructions provided for staff nurses to change twice a week and PRN.   Topical treatment orders provided for bedside nurses to perform as follows: Pack bilat ischium and sacrum wounds Q day with Dakin's moistened gauze, then cover with ABD pads and tape Apply xerform to scrotum macerated areas Q day; hold in place with ABD pad Please re-consult if further assistance is needed.  Thank-you,  Julien Girt MSN, [place], [place], [place], [place]

## 2021-02-17 DIAGNOSIS — G9341 Metabolic encephalopathy: Secondary | ICD-10-CM | POA: Diagnosis not present

## 2021-02-17 LAB — BASIC METABOLIC PANEL
Anion gap: 6 (ref 5–15)
BUN: 14 mg/dL (ref 8–23)
CO2: 20 mmol/L — ABNORMAL LOW (ref 22–32)
Calcium: 7.3 mg/dL — ABNORMAL LOW (ref 8.9–10.3)
Chloride: 110 mmol/L (ref 98–111)
Creatinine, Ser: 0.74 mg/dL (ref 0.61–1.24)
GFR, Estimated: >60 mL/min (ref >60)
Glucose, Bld: 195 mg/dL — ABNORMAL HIGH (ref 70–99)
Potassium: 3.1 mmol/L — ABNORMAL LOW (ref 3.5–5.1)
Sodium: 136 mmol/L (ref 135–145)

## 2021-02-17 LAB — BPAM RBC
Blood Product Expiration Date: 202204052359
ISSUE DATE / TIME: 202203040439
Unit Type and Rh: 5100

## 2021-02-17 LAB — GLUCOSE, CAPILLARY
Glucose-Capillary: 238 mg/dL — ABNORMAL HIGH (ref 70–99)
Glucose-Capillary: 208 mg/dL — ABNORMAL HIGH (ref 70–99)
Glucose-Capillary: 182 mg/dL — ABNORMAL HIGH (ref 70–99)
Glucose-Capillary: 197 mg/dL — ABNORMAL HIGH (ref 70–99)
Glucose-Capillary: 101 mg/dL — ABNORMAL HIGH (ref 70–99)
Glucose-Capillary: 150 mg/dL — ABNORMAL HIGH (ref 70–99)

## 2021-02-17 LAB — TYPE AND SCREEN
ABO/RH(D): O POS
Antibody Screen: NEGATIVE
Unit division: 0

## 2021-02-17 MED ORDER — POTASSIUM CHLORIDE CRYS ER 20 MEQ PO TBCR
40.0000 meq | EXTENDED_RELEASE_TABLET | Freq: Two times a day (BID) | ORAL | Status: DC
  Administered 2021-02-17 – 2021-02-18 (×3): 40 meq via ORAL
  Filled 2021-02-17 (×3): qty 2

## 2021-02-17 MED ORDER — DAKINS (1/4 STRENGTH) 0.125 % EX SOLN
CUTANEOUS | Status: DC
  Filled 2021-02-17: qty 473

## 2021-02-17 NOTE — Progress Notes (Signed)
PROGRESS NOTE    Blake Villarreal  WUJ:[id number] DOB: 1946-09-27 DOA: 02/16/2021 PCP: Eloisa NorthernAmin, Saad, MD    Brief Narrative:  75 year old gentleman with history of COPD, coronary artery disease, type 2 diabetes, hypertension, peripheral arterial disease and large decubitus ulcer with recent VRE, ESBL E. coli and Proteus bacteremia from a nursing home who was recently discharged back to nursing home with PICC line and IV meropenem presented back to the hospital with decreased responsiveness and hypoglycemic event at skilled nursing facility. Third admission since last 1 month with failure to thrive, not eating, blood sugars less than 30. In the emergency room potassium 3.1, blood glucose 293 after resuscitation.  Hemoglobin 6.2.  Given resuscitation with magnesium, potassium, 1 unit of PRBC and dextrose and admitted.   Assessment & Plan:   Principal Problem:   Acute metabolic encephalopathy Active Problems:   Decubitus ulcer of sacral region, stage 4 (HCC)   Severe protein-energy malnutrition (HCC)   Chronic osteomyelitis of sacrum (HCC)   Hypoglycemia  Acute metabolic encephalopathy due to hypoglycemia: Recurrent event. Recurrent hypoglycemia with mental status changes in a patient with underlying advanced debility and failure to thrive.  Patient is only on sliding scale insulin, not on any sulfonylurea at the nursing home.  Will check sulfonylurea level even though patient is not on sulfonylurea. Patient has not been eating or drinking well for many months now. No focal deficit but generalized weakness. See goals of care discussion below. Blood sugars well responded on dextrose infusion, discontinue and monitor.  Acute on chronic anemia of chronic disease: Hemoglobin 6.2.  Transfuse 1 unit of PRBC.  Appropriately responded.  Will monitor.  Hypokalemia and hypomagnesemia: Severe and persistent.  Replaced.  Replace further.  Sacral decubitus ulcer present on admission.  Osteomyelitis of  the sacrum with VRE and ESBL E. coli as well as Proteus bacteremia: Currently on meropenem through the PICC line.  Continue.  Severe protein calorie malnutrition Adult failure to thrive End-stage decubitus ulcer with sacral osteomyelitis Profound physical debility Recurrent hypoglycemia  Goal of care:  Discussed with patient.  Also seen by palliative and he is agreeable to comfort care and hospice level of care.   He tells me that he would like to go to hospice if they can take him around, let him smoke and feed him as well let his family come and visit him.   Patient is appropriate for inpatient hospice. Will explore if patient can go back to [facility] with hospice in place.   Case discussed in detail with patient's healthcare power of attorney, Ms. Rosey Batheresa and changed to comfort care. Unrestricted visitor.  Patient has 3 children they live in [place] and they are not so close to him, however he wants to see them. Continue current level of care, do not escalate care, no further lab draws.   DVT prophylaxis: enoxaparin (LOVENOX) injection 40 mg Start: 02/16/21 1000   Code Status: DNR Family Communication: Patient's niece Mr. Rosey Batheresa on the phone Disposition Plan: Status is: Observation  The patient will require care spanning > 2 midnights and should be moved to inpatient because: Hemodynamically unstable, Persistent severe electrolyte disturbances and Altered mental status  Dispo: The patient is from: SNF              Anticipated d/c is to: Unknown.              Patient currently is not medically stable to d/c.   Difficult to place patient No  Consultants:  Palliative care   Procedures:   None  Antimicrobials:  Anti-infectives (From admission, onward)   Start     Dose/Rate Route Frequency Ordered Stop   02/16/21 1400  meropenem (MERREM) 1 g in sodium chloride 0.9 % 100 mL IVPB        1 g 200 mL/hr over 30 Minutes Intravenous Every 8 hours 02/16/21 0946      02/16/21 0600  meropenem (MERREM) IVPB  Status:  Discontinued       Note to Pharmacy: Indication:  VRE Labs - Once weekly:  CBC/D and BMP, End date 02/21/21 Method of administration: Mini-Bag Plus / Gravity Method of administration may be changed at the discretion of home infusion pharmacist based upon assessment of the patient and/or caregiver's ability to self-administer the medi   1 g Intravenous Every 8 hours 02/16/21 0433 02/16/21 0439   02/16/21 0600  meropenem (MERREM) 1 g in sodium chloride 0.9 % 100 mL IVPB  Status:  Discontinued        1 g 200 mL/hr over 30 Minutes Intravenous Every 8 hours 02/16/21 0440 02/16/21 1000   02/16/21 0600  meropenem (MERREM) IVPB  Status:  Discontinued       Note to Pharmacy: Indication:  VRE Labs - Once weekly:  CBC/D and BMP, End date 02/21/21 Method of administration: Mini-Bag Plus / Gravity Method of administration may be changed at the discretion of home infusion pharmacist based upon assessment of the patient and/or caregiver's ability to self-administer the medi   1 g Intravenous Every 8 hours 02/16/21 0448 02/16/21 0944       Subjective:  Patient was seen and examined.  No overnight events.  He was more awake today.  He was eating his breakfast.  He was able to tell me that he is interested in hospice if they all let him eat and let him go out in a wheelchair.  He understands that he may die soon and accepts it. He wants to see if he children can come and visit him. Blood sugars fairly stable on current dextrose drip 50 mL/h.  Will encourage more oral diet, sugar intake and discontinue dextrose to see if he can maintain it.  Objective: Vitals:   02/16/21 2024 02/17/21 0111 02/17/21 0514 02/17/21 0729  BP: (!) 150/61 139/66 (!) 148/61 (!) 153/56  Pulse: 81 88 89 83  Resp: 17 16 16 18   Temp: 98.7 F (37.1 C) 98.2 F (36.8 C) 98.7 F (37.1 C) 97.8 F (36.6 C)  TempSrc: Oral  Oral   SpO2: 100% 100% 100% 100%  Weight:      Height:         Intake/Output Summary (Last 24 hours) at 02/17/2021 1107 Last data filed at 02/17/2021 04/19/2021 Gross per 24 hour  Intake 1863.03 ml  Output 600 ml  Net 1263.03 ml   Filed Weights   02/16/21 0141  Weight: 68.1 kg    Examination:  General exam: Chronically sick looking.  Frail and debilitated.  Cachectic gentleman.  Not in any distress today.  Eating breakfast.  On room air. Respiratory system: Clear to auscultation.  Some conducted airway sounds. Cardiovascular system: S1 & S2 heard, RRR. Gastrointestinal system: Soft and nontender.  Bowel sounds present.  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 Central nervous system: Alert awake and oriented x3.  Generalized weakness. No focal deficit. Chronic ulceration at the base of the scrotum and perineum, sacral area, stage IV sacral decubitus ulcer. Right above-knee amputation the  stump clean.     Data Reviewed: I have personally reviewed following labs and imaging studies  CBC: Recent Labs  Lab 02/11/21 0359 02/16/21 0150 02/16/21 0600 02/16/21 0907  WBC 12.7* 9.4 9.4 10.8*  NEUTROABS  --  6.8  --   --   HGB 7.2* 6.2* 9.1* 7.4*  HCT 22.4* 21.2* 29.8* 23.8*  MCV 83.6 88.3 90.0 86.2  PLT 163 256 200 253   Basic Metabolic Panel: Recent Labs  Lab 02/16/21 0150 02/16/21 0600 02/16/21 0632 02/16/21 1520 02/17/21 0923  NA 138 144 144 139 136  K 3.1* 6.1* >7.5* 3.5 3.1*  CL 114* 108 101 113* 110  CO2 18* 21* 20* 20* 20*  GLUCOSE 293* 182* 272* 170* 195*  BUN 13 15 13 15 14   CREATININE 0.82 0.91 0.81 0.78 0.74  CALCIUM 6.8* 6.6* 5.5* 7.5* 7.3*  MG 1.6*  --   --  1.9  --    GFR: Estimated Creatinine Clearance: 76.8 mL/min (by C-G formula based on SCr of 0.74 mg/dL). Liver Function Tests: Recent Labs  Lab 02/16/21 0150 02/16/21 0632  AST 35 45*  ALT 35 33  ALKPHOS 88 80  BILITOT 0.3 0.4  PROT 4.2* 4.1*  ALBUMIN 1.0* 1.1*   No results for input(s): LIPASE, AMYLASE in the last 168 hours. No results  for input(s): AMMONIA in the last 168 hours. Coagulation Profile: No results for input(s): INR, PROTIME in the last 168 hours. Cardiac Enzymes: No results for input(s): CKTOTAL, CKMB, CKMBINDEX, TROPONINI in the last 168 hours. BNP (last 3 results) No results for input(s): PROBNP in the last 8760 hours. HbA1C: No results for input(s): HGBA1C in the last 72 hours. CBG: Recent Labs  Lab 02/16/21 1020 02/16/21 2024 02/17/21 0112 02/17/21 0521 02/17/21 0833  GLUCAP 124* 210* 238* 208* 182*   Lipid Profile: No results for input(s): CHOL, HDL, LDLCALC, TRIG, CHOLHDL, LDLDIRECT in the last 72 hours. Thyroid Function Tests: No results for input(s): TSH, T4TOTAL, FREET4, T3FREE, THYROIDAB in the last 72 hours. Anemia Panel: No results for input(s): VITAMINB12, FOLATE, FERRITIN, TIBC, IRON, RETICCTPCT in the last 72 hours. Sepsis Labs: Recent Labs  Lab 02/16/21 0151  LATICACIDVEN 1.5    Recent Results (from the past 240 hour(s))  CULTURE, BLOOD (ROUTINE X 2) w Reflex to ID Panel     Status: None   Collection Time: 02/08/21 11:37 PM   Specimen: BLOOD  Result Value Ref Range Status   Specimen Description BLOOD BLOOD RIGHT HAND  Final   Special Requests   Final    BOTTLES DRAWN AEROBIC AND ANAEROBIC Blood Culture adequate volume   Culture   Final    NO GROWTH 5 DAYS Performed at [facility], [address] [place], [place]    Report Status 02/13/2021 FINAL  Final  CULTURE, BLOOD (ROUTINE X 2) w Reflex to ID Panel     Status: None   Collection Time: 02/08/21 11:43 PM   Specimen: BLOOD  Result Value Ref Range Status   Specimen Description BLOOD BLOOD LEFT HAND  Final   Special Requests   Final    BOTTLES DRAWN AEROBIC AND ANAEROBIC Blood Culture adequate volume   Culture   Final    NO GROWTH 5 DAYS Performed at [facility], [address], [place], [place]    Report Status 02/13/2021 FINAL  Final  Culture, blood (routine x 2)      Status: None (Preliminary result)   Collection Time: 02/16/21  1:46 AM   Specimen: BLOOD  Result Value Ref Range Status   Specimen Description BLOOD LEFT ARM  Final   Special Requests   Final    BOTTLES DRAWN AEROBIC AND ANAEROBIC Blood Culture adequate volume   Culture   Final    NO GROWTH 1 DAY Performed at [facility], [address] [place], [place] [address]    Report Status PENDING  Incomplete  Culture, blood (routine x 2)     Status: None (Preliminary result)   Collection Time: 02/16/21  2:47 AM   Specimen: BLOOD  Result Value Ref Range Status   Specimen Description BLOOD LEFT ARM  Final   Special Requests   Final    BOTTLES DRAWN AEROBIC AND ANAEROBIC Blood Culture adequate volume   Culture   Final    NO GROWTH 1 DAY Performed at [facility], [address], [place], [place] [address]    Report Status PENDING  Incomplete         Radiology Studies: DG Chest Portable 1 View  Result Date: 02/16/2021 CLINICAL DATA:  Aspiration EXAM: PORTABLE CHEST 1 VIEW COMPARISON:  02/06/2021 FINDINGS: Lungs are clear. No pneumothorax or pleural effusion. Stable pleural thickening laterally on the left, better seen on CT examination of 08/18/2008. Cardiac size is within normal limits. Right upper extremity PICC line tip noted within the right atrium. No acute bone abnormality. IMPRESSION: No active disease. Electronically Signed   By: Helyn Numbers MD   On: 02/16/2021 02:41        Scheduled Meds: . vitamin C  500 mg Oral BID  . atorvastatin  40 mg Oral QPM  . carvedilol  6.25 mg Oral BID WC  . Chlorhexidine Gluconate Cloth  6 each Topical Daily  . cholecalciferol  5,000 Units Oral Daily  . enoxaparin (LOVENOX) injection  40 mg Subcutaneous Q24H  . feeding supplement (NEPRO CARB STEADY)  237 mL Oral TID BM  . ferrous sulfate  65 mg of iron Oral TID WC  . folic acid  1 mg Oral Daily  . gabapentin  100 mg Oral TID  . mirtazapine  7.5 mg Oral QHS  .  multivitamin with minerals  1 tablet Oral Daily  . nystatin  5 mL Oral QID  . nystatin   Topical BID  . pantoprazole  40 mg Oral Daily  . potassium chloride  40 mEq Oral BID  . sertraline  50 mg Oral Daily  . sodium hypochlorite   Topical 1 day or 1 dose  . tamsulosin  0.4 mg Oral Daily  . zinc sulfate  220 mg Oral Daily   Continuous Infusions: . sodium chloride Stopped (02/16/21 0351)  . meropenem (MERREM) IV 1 g (02/17/21 0523)     LOS: 0 days    Time spent: 30 minutes.    Dorcas Carrow, MD [facility] Pager [phone]

## 2021-02-17 NOTE — Plan of Care (Signed)
Pt refused Nystatin Powders this shift. Note left for MD. Med class not on urgent notification list. Pt stated "You arent putting any powders on me...they dry me out. Pt educated on the use and still refused. Male purewick in place and ileostomy. Pt received oxycodone for pain. Nutrition supplement given and snacks x 2. Pt refused to be turned and informed of risk due to wounds. Pt is on airmattress. Will continue to monitor.  Problem: Education: Goal: Knowledge of General Education information will improve Description: Including pain rating scale, medication(s)/side effects and non-pharmacologic comfort measures Outcome: Progressing   Problem: Health Behavior/Discharge Planning: Goal: Ability to manage health-related needs will improve Outcome: Progressing   Problem: Clinical Measurements: Goal: Ability to maintain clinical measurements within normal limits will improve Outcome: Progressing Goal: Will remain free from infection Outcome: Progressing Goal: Diagnostic test results will improve Outcome: Progressing Goal: Respiratory complications will improve Outcome: Progressing Goal: Cardiovascular complication will be avoided Outcome: Progressing   Problem: Activity: Goal: Risk for activity intolerance will decrease Outcome: Progressing   Problem: Nutrition: Goal: Adequate nutrition will be maintained Outcome: Progressing   Problem: Coping: Goal: Level of anxiety will decrease Outcome: Progressing   Problem: Elimination: Goal: Will not experience complications related to bowel motility Outcome: Progressing Goal: Will not experience complications related to urinary retention Outcome: Progressing   Problem: Pain Managment: Goal: General experience of comfort will improve Outcome: Progressing   Problem: Safety: Goal: Ability to remain free from injury will improve Outcome: Progressing   Problem: Skin Integrity: Goal: Risk for impaired skin integrity will  decrease Outcome: Progressing

## 2021-02-18 DIAGNOSIS — G9341 Metabolic encephalopathy: Secondary | ICD-10-CM | POA: Diagnosis not present

## 2021-02-18 LAB — GLUCOSE, CAPILLARY
Glucose-Capillary: 100 mg/dL — ABNORMAL HIGH (ref 70–99)
Glucose-Capillary: 133 mg/dL — ABNORMAL HIGH (ref 70–99)

## 2021-02-18 MED ORDER — OXYCODONE HCL 5 MG PO TABS: 5.0000 mg | ORAL_TABLET | Freq: Four times a day (QID) | ORAL | 0 refills | Status: AC | PRN

## 2021-02-18 NOTE — Progress Notes (Signed)
EMS called by Ivan Croft MSW for nonemergency transport from Room 106 to [facility]

## 2021-02-18 NOTE — Plan of Care (Signed)

## 2021-02-18 NOTE — Discharge Summary (Signed)
Physician Discharge Summary  Blake Villarreal QIH:[id number] DOB: 08-24-1946 DOA: 02/16/2021  PCP: Eloisa Northern, MD  Admit date: 02/16/2021 Discharge date: 02/18/2021  Admitted From: Skilled nursing facility Disposition: Skilled nursing facility  Recommendations for Outpatient Follow-up:  1. Consult hospice on arrival to skilled nursing facility.  Further recommendation as per hospice.   Discharge Condition: Fair CODE STATUS: DNR Diet recommendation: Regular diet, chopped food.  Aspiration precautions.  Discharge summary: 75 year old gentleman with history of COPD, coronary artery disease, type 2 diabetes, hypertension, peripheral arterial disease and large decubitus ulcer with recent VRE, ESBL E. coli and Proteus bacteremia from a nursing home who was recently discharged back to nursing home with PICC line and IV meropenem presented back to the hospital with decreased responsiveness and hypoglycemic event at skilled nursing facility.   Third admission since last 1 month with failure to thrive, not eating, blood sugars less than 30. In the emergency room potassium 3.1, blood glucose 293 after resuscitation.  Hemoglobin 6.2. Given resuscitation with magnesium, potassium, 1 unit of PRBC and dextrose and admitted.   Assessment & Plan of care:   # Acute metabolic encephalopathy due to hypoglycemia: Recurrent event. Recurrent hypoglycemia with mental status changes in a patient with underlying advanced debility and failure to thrive.  Patient is only on sliding scale insulin, not on any sulfonylurea at the nursing home.  Patient has not been eating or drinking well for many months now. No focal deficit but generalized weakness. See goals of care discussion below.   Initially treated with dextrose infusion.  Now remains on oral intake, no hypoglycemic episode for last 24 hours. Discontinue all insulin products.  Let him eat regular diet and high carb diet as his life expectancy is not more than few  months and treating him with insulin is not going to change it.  # Acute on chronic anemia of chronic disease: Hemoglobin 6.2.  Transfuse 1 unit of PRBC.  Appropriately responded.    No further transfusion.  #Electrolyte abnormalities: Replaced and adequate.  # Sacral decubitus ulcer present on admission.  Osteomyelitis of the sacrum with VRE and ESBL E. coli as well as Proteus bacteremia: Currently on meropenem through the PICC line with last date planned 3/9.  PICC line with no good flow. Will dc before discharge.  Since plan is to start hospice care , will not put a new PICC line. No oral alternative available.   # Severe protein calorie malnutrition//Adult failure to thrive/End-stage decubitus ulcer with sacral osteomyelitis/Profound physical debility/Recurrent hypoglycemia  Goal of care:  Discussed with patient.  Also seen by palliative and he is agreeable to comfort care and hospice level of care.   He tells me that he would like to go back to [facility] as he likes the place.   He is agreeable to be seen by hospice at the facility.   Patient will benefit with end-of-life care at nursing home.  And patient has agreed on his sound mind as well his healthcare power of attorney. Please consult hospice on arrival to nursing home. Further management, modification of therapy as per hospice. Patient was not clear for me to sign a do not hospitalize plan, however he plans to discuss that with hospice team.   Patient is medically stable to transfer to skilled level of care.  Addendum: PICC line had to be removed with poor flow. Since he is going to SNF with plans for hospice care, will not recommend another PICC line. Few more days of antibiotics  are not going to change outcome and his natural trajectory. There is no oral alternative for ESBL/VRE.     Discharge Diagnoses:  Principal Problem:   Acute metabolic encephalopathy Active Problems:   Decubitus ulcer of sacral region, stage 4 (HCC)    Severe protein-energy malnutrition (HCC)   Chronic osteomyelitis of sacrum (HCC)   Hypoglycemia    Discharge Instructions  Discharge Instructions    Diet general   Complete by: As directed    Discharge instructions   Complete by: As directed    Consult hospice on arrival to nursing home  Remove PICC line when finished antibiotics on 3/9 Do not use any diabetic medicine   Discharge wound care:   Complete by: As directed    Pack bilat ischium and sacrum wounds Q day with Dakin's moistened gauze, then cover with ABD pads and tape Apply xerform to scrotum macerated areas Q day; hold in place with ABD pad   Increase activity slowly   Complete by: As directed      Allergies as of 02/18/2021      Reactions   Daptomycin    Iodinated Diagnostic Agents       Medication List    STOP taking these medications   insulin aspart 100 UNIT/ML injection Commonly known as: novoLOG   meropenem  IVPB Commonly known as: MERREM     TAKE these medications   acetaminophen 325 MG tablet Commonly known as: TYLENOL Take 650 mg by mouth every 4 (four) hours as needed.   atorvastatin 40 MG tablet Commonly known as: LIPITOR Take 40 mg by mouth every evening.   carvedilol 6.25 MG tablet Commonly known as: COREG Take 1 tablet (6.25 mg total) by mouth 2 (two) times daily with a meal.   feeding supplement (NEPRO CARB STEADY) Liqd Take 237 mLs by mouth 3 (three) times daily between meals.   ferrous sulfate 75 (15 Fe) MG/ML Soln Commonly known as: FER-IN-SOL Take 4.3 mLs (65 mg of iron total) by mouth 3 (three) times daily with meals.   folic acid 1 MG tablet Commonly known as: FOLVITE Take 1 mg by mouth daily.   gabapentin 100 MG capsule Commonly known as: NEURONTIN Take 100 mg by mouth 3 (three) times daily.   mirtazapine 7.5 MG tablet Commonly known as: REMERON Take 7.5 mg by mouth at bedtime.   multivitamin with minerals Tabs tablet Take 1 tablet by mouth daily.   nystatin  100000 UNIT/ML suspension Commonly known as: MYCOSTATIN Take 5 mLs by mouth 4 (four) times daily.   omeprazole 20 MG capsule Commonly known as: PRILOSEC Take 20 mg by mouth 2 (two) times daily before a meal.   oxyCODONE 5 MG immediate release tablet Commonly known as: Oxy IR/ROXICODONE Take 1 tablet (5 mg total) by mouth every 6 (six) hours as needed for up to 5 days for severe pain or breakthrough pain.   sertraline 50 MG tablet Commonly known as: ZOLOFT Take 50 mg by mouth daily.   tamsulosin 0.4 MG Caps capsule Commonly known as: FLOMAX Take 0.4 mg by mouth daily.   vitamin C 1000 MG tablet Take 1,000 mg by mouth daily.   Vitamin D 125 MCG (5000 UT) Caps Take 1,000 Units by mouth daily.   zinc sulfate 220 (50 Zn) MG capsule Take 220 mg by mouth daily.            Discharge Care Instructions  (From admission, onward)         Start  Ordered   02/18/21 0000  Discharge wound care:       Comments: Pack bilat ischium and sacrum wounds Q day with Dakin's moistened gauze, then cover with ABD pads and tape Apply xerform to scrotum macerated areas Q day; hold in place with ABD pad   02/18/21 1018          Allergies  Allergen Reactions  . Daptomycin   . Iodinated Diagnostic Agents     Consultations:  Palliative care   Procedures/Studies: CT CHEST WO CONTRAST  Result Date: 01/22/2021 CLINICAL DATA:  Respiratory failure, COVID-19 positive, renal insufficiency, diabetes EXAM: CT CHEST WITHOUT CONTRAST TECHNIQUE: Multidetector CT imaging of the chest was performed following the standard protocol without IV contrast. COMPARISON:  01/22/2021 FINDINGS: Cardiovascular: Unenhanced imaging of the heart and great vessels demonstrates no significant pericardial effusion. No evidence of thoracic aortic aneurysm. Evaluation of the aortic lumen is limited without IV contrast. Diffuse atherosclerosis of the aorta and coronary vasculature. Mediastinum/Nodes: No enlarged  mediastinal or axillary lymph nodes. Thyroid gland, trachea, and esophagus demonstrate no significant findings. Lungs/Pleura: Upper lobe predominant emphysema. There are scattered areas of subpleural ground-glass airspace disease within the right upper and right middle lobes, compatible with early infection. There is circumferential left pleural thickening and calcification, consistent with prior surgery or trauma. Rounded consolidation within the left lower lobe most compatible with rounded atelectasis. No effusion or pneumothorax. There is mucoid material within the right mainstem bronchus. Right lower lobe bronchial wall thickening is identified consistent with bronchitis or reactive airway disease. Upper Abdomen: No acute abnormality. Musculoskeletal: No acute or destructive bony lesions. IMPRESSION: 1. Upper lobe predominant emphysema, with patchy right upper and right middle lobe subpleural ground-glass airspace disease compatible with early infection or inflammation. 2. Right lower lobe bronchial wall thickening consistent with bronchitis or reactive airway disease. 3. Left pleural thickening and calcification consistent with previous trauma or surgery. Rounded left lower lobe consolidation most compatible with rounded atelectasis. 4. Aortic Atherosclerosis (ICD10-I70.0) and Emphysema (ICD10-J43.9). Electronically Signed   By: Sharlet Salina M.D.   On: 01/22/2021 16:58   CT PELVIS WO CONTRAST  Result Date: 02/06/2021 CLINICAL DATA:  Scrotal swelling and ulceration. Evaluate for gangrene. EXAM: CT PELVIS WITHOUT CONTRAST TECHNIQUE: Multidetector CT imaging of the pelvis was performed following the standard protocol without intravenous contrast. COMPARISON:  CT abdomen pelvis dated May 30, 2017. FINDINGS: Urinary Tract: No abnormality visualized. Punctate calcification in the visualized lower pole of the right kidney is likely vascular in etiology when compared to prior study. Bowel: Left lower quadrant  loop colostomy. No bowel wall thickening, distention, or surrounding inflammatory changes. Vascular/Lymphatic: Mild interval increase in size of the infrarenal abdominal aortic aneurysm measuring up to 3.2 cm, previously 3.0 cm. Extensive aortoiliac atherosclerotic calcification prior fem-fem bypass graft. No enlarged pelvic lymph nodes. Reproductive:  Prostate gland is unremarkable. Other:  None. Musculoskeletal: Large sacral and bilateral ischial decubitus ulcers extending to bone. Chronic smooth erosion of both ischial tuberosities. Increased sclerosis in the right ischial tuberosity. Chronic erosion of the lower sacrum and coccyx. No subcutaneous emphysema. IMPRESSION: 1. No evidence of necrotizing infection. 2. Large sacral and bilateral ischial decubitus ulcers extending to bone with chronic osteomyelitis of the sacrum, coccyx, and both ischial tuberosities. 3. Mild interval increase in size of the infrarenal abdominal aortic aneurysm measuring up to 3.2 cm, previously 3.0 cm. Recommend follow-up every 3 years. This recommendation follows ACR consensus guidelines: White Paper of the ACR Incidental Findings Committee II on Vascular  Findings. J Am Coll Radiol 2013; 35:701-779. 4. Aortic Atherosclerosis (ICD10-I70.0). Electronically Signed   By: Obie Dredge M.D.   On: 02/06/2021 21:28   US RENAL  Result Date: 01/22/2021 CLINICAL DATA:  Acute kidney injury EXAM: RENAL / URINARY TRACT ULTRASOUND COMPLETE COMPARISON:  None. FINDINGS: Right Kidney: Renal measurements: 10.7 x 4.5 x 4.5 cm = volume: 112.6 mL. Diffusely increased renal cortical echogenicity. 9.6 mm shadowing calculus is seen upper pole. There is mild pelviectasis without calyceal dilatation. No frank hydronephrosis. No concerning renal mass. Left Kidney: Renal measurements: 10.5 x 5.9 x 5.3 cm = volume: 170 mL. Diffusely increased renal cortical echogenicity. 9.4 mm shadowing calculus in the interpolar left kidney. No significant urinary tract  dilatation. No concerning renal mass. Bladder: Appears normal for degree of bladder distention. Bilateral bladder jets are identified. Other: None. IMPRESSION: 1. Diffusely increased renal cortical echogenicity compatible with medical renal disease. 2. Bilateral nonobstructing renal calculi. 3. Mild right pelviectasis without frank hydronephrosis. Presence of a visible right bladder jet argues against the presence of obstruction, may reflect extrarenal pelvis or benign incidental. Electronically Signed   By: Kreg Shropshire M.D.   On: 01/22/2021 17:24   DG Chest Portable 1 View  Result Date: 02/16/2021 CLINICAL DATA:  Aspiration EXAM: PORTABLE CHEST 1 VIEW COMPARISON:  02/06/2021 FINDINGS: Lungs are clear. No pneumothorax or pleural effusion. Stable pleural thickening laterally on the left, better seen on CT examination of 08/18/2008. Cardiac size is within normal limits. Right upper extremity PICC line tip noted within the right atrium. No acute bone abnormality. IMPRESSION: No active disease. Electronically Signed   By: Helyn Numbers MD   On: 02/16/2021 02:41   DG Chest Portable 1 View  Result Date: 02/06/2021 CLINICAL DATA:  Altered mental status.  Found unresponsive. EXAM: PORTABLE CHEST 1 VIEW COMPARISON:  May 23, 2020 FINDINGS: Mild, stable linear scarring is seen within the lateral aspect of the left lung base. Mild left-sided volume loss is noted. There is no evidence of acute infiltrate, pleural effusion or pneumothorax. The heart size and mediastinal contours are within normal limits. There is marked severity calcification of the aortic arch. The visualized skeletal structures are unremarkable. IMPRESSION: Mild left basilar linear scarring without evidence of acute or active cardiopulmonary disease. Electronically Signed   By: Aram Candela M.D.   On: 02/06/2021 19:13   DG Chest Port 1 View  Result Date: 01/22/2021 CLINICAL DATA:  Respiratory distress and altered mental status. EXAM: PORTABLE  CHEST 1 VIEW COMPARISON:  None. FINDINGS: Atherosclerotic calcification of the aortic arch. Tortuous thoracic aorta. Heart size within normal limits for projection. Abnormal blunting of the left lateral costophrenic angle. Linear bandlike scarring peripherally in the left lung base. There is some hazy density at the left lung base, possibly from layering pleural fluid. Volume loss at the left lung base. The right lung appears clear. IMPRESSION: 1. Hazy density at the left lung base, potentially from layering pleural fluid. Blunted left lateral costophrenic angle. 2. Scarring peripherally in the left lung base with some mild left basilar volume loss. 3. Tortuous thoracic aorta.  Aortic Atherosclerosis (ICD10-I70.0). Electronically Signed   By: Gaylyn Rong M.D.   On: 01/22/2021 11:23   [place] EKG SITE RITE  Result Date: 02/13/2021 If Site Rite image not attached, placement could not be confirmed due to current cardiac rhythm.  (Echo, Carotid, EGD, Colonoscopy, ERCP)    Subjective: Patient seen and examined.  No overnight events.  Today he is alert awake and  oriented x4.  He tells me that he enjoys at [facility], they help him get around and go out.  He tells me that he would like to be in the hospice service if they can help him.   Discharge Exam: Vitals:   02/18/21 0521 02/18/21 0742  BP: (!) 154/70 (!) 165/72  Pulse: 83 80  Resp: 20 18  Temp: 98.8 F (37.1 C) (!) 97.4 F (36.3 C)  SpO2: 100% 100%   Vitals:   02/17/21 0514 02/17/21 0729 02/18/21 0521 02/18/21 0742  BP: (!) 148/61 (!) 153/56 (!) 154/70 (!) 165/72  Pulse: 89 83 83 80  Resp: 16 18 20 18   Temp: 98.7 F (37.1 C) 97.8 F (36.6 C) 98.8 F (37.1 C) (!) 97.4 F (36.3 C)  TempSrc: Oral  Oral   SpO2: 100% 100% 100% 100%  Weight:      Height:        General: Chronically sick looking.  Very frail and debilitated gentleman lying in bed.  Trying to eat breakfast.  Alert and awake. Cardiovascular: RRR, S1/S2 +, no  rubs, no gallops Respiratory: Some conducted airway sounds. Abdominal: Soft, NT, ND, bowel sounds +, colostomy with loose stool. Extremities:   Chronic ulceration at the base of the scrotum and perineum, sacral area, stage IV sacral decubitus ulcer. Right above-knee amputation the stump clean.    The results of significant diagnostics from this hospitalization (including imaging, microbiology, ancillary and laboratory) are listed below for reference.     Microbiology: Recent Results (from the past 240 hour(s))  CULTURE, BLOOD (ROUTINE X 2) w Reflex to ID Panel     Status: None   Collection Time: 02/08/21 11:37 PM   Specimen: BLOOD  Result Value Ref Range Status   Specimen Description BLOOD BLOOD RIGHT HAND  Final   Special Requests   Final    BOTTLES DRAWN AEROBIC AND ANAEROBIC Blood Culture adequate volume   Culture   Final    NO GROWTH 5 DAYS Performed at [facility], [address] [place], [place] [address]    Report Status 02/13/2021 FINAL  Final  CULTURE, BLOOD (ROUTINE X 2) w Reflex to ID Panel     Status: None   Collection Time: 02/08/21 11:43 PM   Specimen: BLOOD  Result Value Ref Range Status   Specimen Description BLOOD BLOOD LEFT HAND  Final   Special Requests   Final    BOTTLES DRAWN AEROBIC AND ANAEROBIC Blood Culture adequate volume   Culture   Final    NO GROWTH 5 DAYS Performed at [facility], [address] [place], [place] [address]    Report Status 02/13/2021 FINAL  Final  Culture, blood (routine x 2)     Status: None (Preliminary result)   Collection Time: 02/16/21  1:46 AM   Specimen: BLOOD  Result Value Ref Range Status   Specimen Description BLOOD LEFT ARM  Final   Special Requests   Final    BOTTLES DRAWN AEROBIC AND ANAEROBIC Blood Culture adequate volume   Culture   Final    NO GROWTH 2 DAYS Performed at [facility], [address] [place], [place] [address]    Report Status PENDING  Incomplete   Culture, blood (routine x 2)     Status: None (Preliminary result)   Collection Time: 02/16/21  2:47 AM   Specimen: BLOOD  Result Value Ref Range Status   Specimen Description BLOOD LEFT ARM  Final   Special Requests  Final    BOTTLES DRAWN AEROBIC AND ANAEROBIC Blood Culture adequate volume   Culture   Final    NO GROWTH 2 DAYS Performed at [facility], [address] [place], [place] [address]    Report Status PENDING  Incomplete     Labs: BNP (last 3 results) Recent Labs    05/20/20 2347  BNP 106.1*   Basic Metabolic Panel: Recent Labs  Lab 02/16/21 0150 02/16/21 0600 02/16/21 0632 02/16/21 1520 02/17/21 0923  NA 138 144 144 139 136  K 3.1* 6.1* >7.5* 3.5 3.1*  CL 114* 108 101 113* 110  CO2 18* 21* 20* 20* 20*  GLUCOSE 293* 182* 272* 170* 195*  BUN CREATININE 0.82 0.91 0.81 0.78 0.74  CALCIUM 6.8* 6.6* 5.5* 7.5* 7.3*  MG 1.6*  --   --  1.9  --    Liver Function Tests: Recent Labs  Lab 02/16/21 0150 02/16/21 0632  AST 35 45*  ALT 35 33  ALKPHOS 88 80  BILITOT 0.3 0.4  PROT 4.2* 4.1*  ALBUMIN 1.0* 1.1*   No results for input(s): LIPASE, AMYLASE in the last 168 hours. No results for input(s): AMMONIA in the last 168 hours. CBC: Recent Labs  Lab 02/16/21 0150 02/16/21 0600 02/16/21 0907  WBC 9.4 9.4 10.8*  NEUTROABS 6.8  --   --   HGB 6.2* 9.1* 7.4*  HCT 21.2* 29.8* 23.8*  MCV 88.3 90.0 86.2  PLT 256 200 253   Cardiac Enzymes: No results for input(s): CKTOTAL, CKMB, CKMBINDEX, TROPONINI in the last 168 hours. BNP: Invalid input(s): POCBNP CBG: Recent Labs  Lab 02/17/21 1237 02/17/21 1841 02/17/21 2058 02/18/21 0746 02/18/21 1215  GLUCAP 197* 101* 150* 100* 133*   D-Dimer No results for input(s): DDIMER in the last 72 hours. Hgb A1c No results for input(s): HGBA1C in the last 72 hours. Lipid Profile No results for input(s): CHOL, HDL, LDLCALC, TRIG, CHOLHDL, LDLDIRECT in the last 72 hours. Thyroid  function studies No results for input(s): TSH, T4TOTAL, T3FREE, THYROIDAB in the last 72 hours.  Invalid input(s): FREET3 Anemia work up No results for input(s): VITAMINB12, FOLATE, FERRITIN, TIBC, IRON, RETICCTPCT in the last 72 hours. Urinalysis    Component Value Date/Time   COLORURINE AMBER (A) 02/16/2021 1500   APPEARANCEUR HAZY (A) 02/16/2021 1500   LABSPEC 1.020 02/16/2021 1500   PHURINE 5.0 02/16/2021 1500   GLUCOSEU 50 (A) 02/16/2021 1500   HGBUR NEGATIVE 02/16/2021 1500   BILIRUBINUR NEGATIVE 02/16/2021 1500   KETONESUR NEGATIVE 02/16/2021 1500   PROTEINUR 30 (A) 02/16/2021 1500   NITRITE NEGATIVE 02/16/2021 1500   LEUKOCYTESUR MODERATE (A) 02/16/2021 1500   Sepsis Labs Invalid input(s): PROCALCITONIN,  WBC,  LACTICIDVEN Microbiology Recent Results (from the past 240 hour(s))  CULTURE, BLOOD (ROUTINE X 2) w Reflex to ID Panel     Status: None   Collection Time: 02/08/21 11:37 PM   Specimen: BLOOD  Result Value Ref Range Status   Specimen Description BLOOD BLOOD RIGHT HAND  Final   Special Requests   Final    BOTTLES DRAWN AEROBIC AND ANAEROBIC Blood Culture adequate volume   Culture   Final    NO GROWTH 5 DAYS Performed at [facility], [address] [place], [place] [address]    Report Status 02/13/2021 FINAL  Final  CULTURE, BLOOD (ROUTINE X 2) w Reflex to ID Panel     Status: None   Collection Time: 02/08/21 11:43 PM  Specimen: BLOOD  Result Value Ref Range Status   Specimen Description BLOOD BLOOD LEFT HAND  Final   Special Requests   Final    BOTTLES DRAWN AEROBIC AND ANAEROBIC Blood Culture adequate volume   Culture   Final    NO GROWTH 5 DAYS Performed at [facility], [address], [place], [place] [address]    Report Status 02/13/2021 FINAL  Final  Culture, blood (routine x 2)     Status: None (Preliminary result)   Collection Time: 02/16/21  1:46 AM   Specimen: BLOOD  Result Value Ref Range Status   Specimen Description  BLOOD LEFT ARM  Final   Special Requests   Final    BOTTLES DRAWN AEROBIC AND ANAEROBIC Blood Culture adequate volume   Culture   Final    NO GROWTH 2 DAYS Performed at [facility], [address] [place], [place] [address]    Report Status PENDING  Incomplete  Culture, blood (routine x 2)     Status: None (Preliminary result)   Collection Time: 02/16/21  2:47 AM   Specimen: BLOOD  Result Value Ref Range Status   Specimen Description BLOOD LEFT ARM  Final   Special Requests   Final    BOTTLES DRAWN AEROBIC AND ANAEROBIC Blood Culture adequate volume   Culture   Final    NO GROWTH 2 DAYS Performed at [facility], [address] [place], [place] [address]    Report Status PENDING  Incomplete     Time coordinating discharge: 40 minutes  SIGNED:   Dorcas Carrow, MD  [facility] 02/18/2021, 1:24 PM

## 2021-02-18 NOTE — TOC Transition Note (Signed)
Transition of Care [facility]) - CM/SW Discharge Note   Patient Details  Name: Blake Villarreal MRN: [id number] Date of Birth: 01-Feb-1946  Transition of [facility]) CM/SW Contact:  Maud Deed, LCSW Phone Number: 02/18/2021, 12:12 PM   Clinical Narrative:    Pt medically stable for discharge per MD. Pt will be transported back to Long term care facility to [name] via [facility].CSW notified pt's legal guardian of discharge. CSW will arrange EMS transport once nursing staff  Is ready.   Final next level of care: Long Term Nursing Home Barriers to Discharge: No Barriers Identified   Patient Goals and CMS Choice        Discharge Placement              Patient chooses bed at: [facility] Patient to be transferred to facility by: [facility] Name of family member notified: [name] Patient and family notified of of transfer: 02/18/21  Discharge Plan and Services                                     Social Determinants of Health (SDOH) Interventions     Readmission Risk Interventions Readmission Risk Prevention Plan 02/13/2021 02/07/2021  Transportation Screening Complete Complete  PCP or Specialist Appt within 3-5 Days (No Data) Complete  HRI or Home Care Consult Complete Complete  Social Work Consult for Recovery Care Planning/Counseling - Complete  Palliative Care Screening Complete Not Applicable  Medication Review Oceanographer) Complete Complete  Some recent data might be hidden

## 2021-02-18 NOTE — Progress Notes (Signed)
Upon entering the pt's room the pt was not in the bed; ? If EMS previously here to discharge pt back to [facility]; discharge envelope not in the pt's chart either

## 2021-02-18 NOTE — Discharge Summary (Signed)
Physician Discharge Summary  Blake Villarreal:[id number] DOB: [birth year] DOA: 02/16/2021  PCP: Eloisa Northern, MD  Admit date: 02/16/2021 Discharge date: 02/18/2021  Admitted From: Skilled nursing facility Disposition: Skilled nursing facility  Recommendations for Outpatient Follow-up:  1. Consult hospice on arrival to skilled nursing facility.  Further recommendation as per hospice.   Discharge Condition: Fair CODE STATUS: DNR Diet recommendation: Regular diet, chopped food.  Aspiration precautions.  Discharge summary: 75 year old gentleman with history of COPD, coronary artery disease, type 2 diabetes, hypertension, peripheral arterial disease and large decubitus ulcer with recent VRE, ESBL E. coli and Proteus bacteremia from a nursing home who was recently discharged back to nursing home with PICC line and IV meropenem presented back to the hospital with decreased responsiveness and hypoglycemic event at skilled nursing facility.   Third admission since last 1 month with failure to thrive, not eating, blood sugars less than 30. In the emergency room potassium 3.1, blood glucose 293 after resuscitation.  Hemoglobin 6.2.  Given resuscitation with magnesium, potassium, 1 unit of PRBC and dextrose and admitted.   Assessment & Plan of care:   # Acute metabolic encephalopathy due to hypoglycemia: Recurrent event. Recurrent hypoglycemia with mental status changes in a patient with underlying advanced debility and failure to thrive.  Patient is only on sliding scale insulin, not on any sulfonylurea at the nursing home.  Patient has not been eating or drinking well for many months now. No focal deficit but generalized weakness. See goals of care discussion below.   Initially treated with dextrose infusion.  Now remains on oral intake, no hypoglycemic episode for last 24 hours. Discontinue all insulin products.  Let him eat regular diet and high carb diet as his life expectancy is not more than  few months and treating him with insulin is not going to change it.  # Acute on chronic anemia of chronic disease: Hemoglobin 6.2.  Transfuse 1 unit of PRBC.  Appropriately responded.    No further transfusion.  #Electrolyte abnormalities: Replaced and adequate.  # Sacral decubitus ulcer present on admission.  Osteomyelitis of the sacrum with VRE and ESBL E. coli as well as Proteus bacteremia: Currently on meropenem through the PICC line.  Continue. Patient has a PICC line.  He is on scheduled to finish his meropenem on 3/9.  Since patient is stable at this time, he can continue and finish his antibiotic therapy.  # Severe protein calorie malnutrition//Adult failure to thrive/End-stage decubitus ulcer with sacral osteomyelitis/Profound physical debility/Recurrent hypoglycemia  Goal of care:  Discussed with patient.  Also seen by palliative and he is agreeable to comfort care and hospice level of care.   He tells me that he would like to go back to [facility] as he likes the place.   He is agreeable to be seen by hospice at the facility.   Patient will benefit with end-of-life care at nursing home.  And patient has agreed on his sound mind as well his healthcare power of attorney. Please consult hospice on arrival to nursing home. Further management, modification of therapy as per hospice. Patient was not clear for me to sign a do not hospitalize plan, however he plans to discuss that with hospice team.   Patient is medically stable to transfer to skilled level of care.     Discharge Diagnoses:  Principal Problem:   Acute metabolic encephalopathy Active Problems:   Decubitus ulcer of sacral region, stage 4 (HCC)   Severe protein-energy malnutrition (HCC)   Chronic  osteomyelitis of sacrum (HCC)   Hypoglycemia    Discharge Instructions  Discharge Instructions    Diet general   Complete by: As directed    Discharge instructions   Complete by: As directed    Consult hospice on  arrival to nursing home  Remove PICC line when finished antibiotics on 3/9 Do not use any diabetic medicine   Discharge wound care:   Complete by: As directed    Pack bilat ischium and sacrum wounds Q day with Dakin's moistened gauze, then cover with ABD pads and tape Apply xerform to scrotum macerated areas Q day; hold in place with ABD pad   Increase activity slowly   Complete by: As directed      Allergies as of 02/18/2021      Reactions   Daptomycin    Iodinated Diagnostic Agents       Medication List    STOP taking these medications   insulin aspart 100 UNIT/ML injection Commonly known as: novoLOG     TAKE these medications   acetaminophen 325 MG tablet Commonly known as: TYLENOL Take 650 mg by mouth every 4 (four) hours as needed.   atorvastatin 40 MG tablet Commonly known as: LIPITOR Take 40 mg by mouth every evening.   carvedilol 6.25 MG tablet Commonly known as: COREG Take 1 tablet (6.25 mg total) by mouth 2 (two) times daily with a meal.   feeding supplement (NEPRO CARB STEADY) Liqd Take 237 mLs by mouth 3 (three) times daily between meals.   ferrous sulfate 75 (15 Fe) MG/ML Soln Commonly known as: FER-IN-SOL Take 4.3 mLs (65 mg of iron total) by mouth 3 (three) times daily with meals.   folic acid 1 MG tablet Commonly known as: FOLVITE Take 1 mg by mouth daily.   gabapentin 100 MG capsule Commonly known as: NEURONTIN Take 100 mg by mouth 3 (three) times daily.   meropenem  IVPB Commonly known as: MERREM Inject 1 g into the vein every 8 (eight) hours for 8 days. Indication:  VRE Labs - Once weekly:  CBC/D and BMP, End date 02/21/21 Method of administration: Mini-Bag Plus / Gravity Method of administration may be changed at the discretion of home infusion pharmacist based upon assessment of the patient and/or caregiver's ability to self-administer the medication ordered.   mirtazapine 7.5 MG tablet Commonly known as: REMERON Take 7.5 mg by mouth at  bedtime.   multivitamin with minerals Tabs tablet Take 1 tablet by mouth daily.   nystatin 100000 UNIT/ML suspension Commonly known as: MYCOSTATIN Take 5 mLs by mouth 4 (four) times daily.   omeprazole 20 MG capsule Commonly known as: PRILOSEC Take 20 mg by mouth 2 (two) times daily before a meal.   oxyCODONE 5 MG immediate release tablet Commonly known as: Oxy IR/ROXICODONE Take 1 tablet (5 mg total) by mouth every 6 (six) hours as needed for up to 5 days for severe pain or breakthrough pain.   sertraline 50 MG tablet Commonly known as: ZOLOFT Take 50 mg by mouth daily.   tamsulosin 0.4 MG Caps capsule Commonly known as: FLOMAX Take 0.4 mg by mouth daily.   vitamin C 1000 MG tablet Take 1,000 mg by mouth daily.   Vitamin D 125 MCG (5000 UT) Caps Take 1,000 Units by mouth daily.   zinc sulfate 220 (50 Zn) MG capsule Take 220 mg by mouth daily.            Discharge Care Instructions  (From admission, onward)  Start     Ordered   02/18/21 0000  Discharge wound care:       Comments: Pack bilat ischium and sacrum wounds Q day with Dakin's moistened gauze, then cover with ABD pads and tape Apply xerform to scrotum macerated areas Q day; hold in place with ABD pad   02/18/21 1018          Allergies  Allergen Reactions  . Daptomycin   . Iodinated Diagnostic Agents     Consultations:  Palliative care   Procedures/Studies: CT CHEST WO CONTRAST  Result Date: 01/22/2021 CLINICAL DATA:  Respiratory failure, COVID-19 positive, renal insufficiency, diabetes EXAM: CT CHEST WITHOUT CONTRAST TECHNIQUE: Multidetector CT imaging of the chest was performed following the standard protocol without IV contrast. COMPARISON:  01/22/2021 FINDINGS: Cardiovascular: Unenhanced imaging of the heart and great vessels demonstrates no significant pericardial effusion. No evidence of thoracic aortic aneurysm. Evaluation of the aortic lumen is limited without IV contrast.  Diffuse atherosclerosis of the aorta and coronary vasculature. Mediastinum/Nodes: No enlarged mediastinal or axillary lymph nodes. Thyroid gland, trachea, and esophagus demonstrate no significant findings. Lungs/Pleura: Upper lobe predominant emphysema. There are scattered areas of subpleural ground-glass airspace disease within the right upper and right middle lobes, compatible with early infection. There is circumferential left pleural thickening and calcification, consistent with prior surgery or trauma. Rounded consolidation within the left lower lobe most compatible with rounded atelectasis. No effusion or pneumothorax. There is mucoid material within the right mainstem bronchus. Right lower lobe bronchial wall thickening is identified consistent with bronchitis or reactive airway disease. Upper Abdomen: No acute abnormality. Musculoskeletal: No acute or destructive bony lesions. IMPRESSION: 1. Upper lobe predominant emphysema, with patchy right upper and right middle lobe subpleural ground-glass airspace disease compatible with early infection or inflammation. 2. Right lower lobe bronchial wall thickening consistent with bronchitis or reactive airway disease. 3. Left pleural thickening and calcification consistent with previous trauma or surgery. Rounded left lower lobe consolidation most compatible with rounded atelectasis. 4. Aortic Atherosclerosis (ICD10-I70.0) and Emphysema (ICD10-J43.9). Electronically Signed   By: Sharlet Salina M.D.   On: 01/22/2021 16:58   CT PELVIS WO CONTRAST  Result Date: 02/06/2021 CLINICAL DATA:  Scrotal swelling and ulceration. Evaluate for gangrene. EXAM: CT PELVIS WITHOUT CONTRAST TECHNIQUE: Multidetector CT imaging of the pelvis was performed following the standard protocol without intravenous contrast. COMPARISON:  CT abdomen pelvis dated May 30, 2017. FINDINGS: Urinary Tract: No abnormality visualized. Punctate calcification in the visualized lower pole of the right  kidney is likely vascular in etiology when compared to prior study. Bowel: Left lower quadrant loop colostomy. No bowel wall thickening, distention, or surrounding inflammatory changes. Vascular/Lymphatic: Mild interval increase in size of the infrarenal abdominal aortic aneurysm measuring up to 3.2 cm, previously 3.0 cm. Extensive aortoiliac atherosclerotic calcification prior fem-fem bypass graft. No enlarged pelvic lymph nodes. Reproductive:  Prostate gland is unremarkable. Other:  None. Musculoskeletal: Large sacral and bilateral ischial decubitus ulcers extending to bone. Chronic smooth erosion of both ischial tuberosities. Increased sclerosis in the right ischial tuberosity. Chronic erosion of the lower sacrum and coccyx. No subcutaneous emphysema. IMPRESSION: 1. No evidence of necrotizing infection. 2. Large sacral and bilateral ischial decubitus ulcers extending to bone with chronic osteomyelitis of the sacrum, coccyx, and both ischial tuberosities. 3. Mild interval increase in size of the infrarenal abdominal aortic aneurysm measuring up to 3.2 cm, previously 3.0 cm. Recommend follow-up every 3 years. This recommendation follows ACR consensus guidelines: White Paper of the ACR Incidental  Findings Committee II on Vascular Findings. J Am Coll Radiol 2013; 16:109-604. 4. Aortic Atherosclerosis (ICD10-I70.0). Electronically Signed   By: Obie Dredge M.D.   On: 02/06/2021 21:28   US RENAL  Result Date: 01/22/2021 CLINICAL DATA:  Acute kidney injury EXAM: RENAL / URINARY TRACT ULTRASOUND COMPLETE COMPARISON:  None. FINDINGS: Right Kidney: Renal measurements: 10.7 x 4.5 x 4.5 cm = volume: 112.6 mL. Diffusely increased renal cortical echogenicity. 9.6 mm shadowing calculus is seen upper pole. There is mild pelviectasis without calyceal dilatation. No frank hydronephrosis. No concerning renal mass. Left Kidney: Renal measurements: 10.5 x 5.9 x 5.3 cm = volume: 170 mL. Diffusely increased renal cortical  echogenicity. 9.4 mm shadowing calculus in the interpolar left kidney. No significant urinary tract dilatation. No concerning renal mass. Bladder: Appears normal for degree of bladder distention. Bilateral bladder jets are identified. Other: None. IMPRESSION: 1. Diffusely increased renal cortical echogenicity compatible with medical renal disease. 2. Bilateral nonobstructing renal calculi. 3. Mild right pelviectasis without frank hydronephrosis. Presence of a visible right bladder jet argues against the presence of obstruction, may reflect extrarenal pelvis or benign incidental. Electronically Signed   By: Kreg Shropshire M.D.   On: 01/22/2021 17:24   DG Chest Portable 1 View  Result Date: 02/16/2021 CLINICAL DATA:  Aspiration EXAM: PORTABLE CHEST 1 VIEW COMPARISON:  02/06/2021 FINDINGS: Lungs are clear. No pneumothorax or pleural effusion. Stable pleural thickening laterally on the left, better seen on CT examination of 08/18/2008. Cardiac size is within normal limits. Right upper extremity PICC line tip noted within the right atrium. No acute bone abnormality. IMPRESSION: No active disease. Electronically Signed   By: Helyn Numbers MD   On: 02/16/2021 02:41   DG Chest Portable 1 View  Result Date: 02/06/2021 CLINICAL DATA:  Altered mental status.  Found unresponsive. EXAM: PORTABLE CHEST 1 VIEW COMPARISON:  May 23, 2020 FINDINGS: Mild, stable linear scarring is seen within the lateral aspect of the left lung base. Mild left-sided volume loss is noted. There is no evidence of acute infiltrate, pleural effusion or pneumothorax. The heart size and mediastinal contours are within normal limits. There is marked severity calcification of the aortic arch. The visualized skeletal structures are unremarkable. IMPRESSION: Mild left basilar linear scarring without evidence of acute or active cardiopulmonary disease. Electronically Signed   By: Aram Candela M.D.   On: 02/06/2021 19:13   DG Chest Port 1  View  Result Date: 01/22/2021 CLINICAL DATA:  Respiratory distress and altered mental status. EXAM: PORTABLE CHEST 1 VIEW COMPARISON:  None. FINDINGS: Atherosclerotic calcification of the aortic arch. Tortuous thoracic aorta. Heart size within normal limits for projection. Abnormal blunting of the left lateral costophrenic angle. Linear bandlike scarring peripherally in the left lung base. There is some hazy density at the left lung base, possibly from layering pleural fluid. Volume loss at the left lung base. The right lung appears clear. IMPRESSION: 1. Hazy density at the left lung base, potentially from layering pleural fluid. Blunted left lateral costophrenic angle. 2. Scarring peripherally in the left lung base with some mild left basilar volume loss. 3. Tortuous thoracic aorta.  Aortic Atherosclerosis (ICD10-I70.0). Electronically Signed   By: Gaylyn Rong M.D.   On: 01/22/2021 11:23   [place] EKG SITE RITE  Result Date: 02/13/2021 If Site Rite image not attached, placement could not be confirmed due to current cardiac rhythm.  (Echo, Carotid, EGD, Colonoscopy, ERCP)    Subjective: Patient seen and examined.  No overnight events.  Today  he is alert awake and oriented x4.  He tells me that he enjoys at [employer], they help him get around and go out.  He tells me that he would like to be in the hospice service if they can help him.   Discharge Exam: Vitals:   02/18/21 0521 02/18/21 0742  BP: (!) 154/70 (!) 165/72  Pulse: 83 80  Resp: 20 18  Temp: 98.8 F (37.1 C) (!) 97.4 F (36.3 C)  SpO2: 100% 100%   Vitals:   02/17/21 0514 02/17/21 0729 02/18/21 0521 02/18/21 0742  BP: (!) 148/61 (!) 153/56 (!) 154/70 (!) 165/72  Pulse: 89 83 83 80  Resp: 16 18 20 18   Temp: 98.7 F (37.1 C) 97.8 F (36.6 C) 98.8 F (37.1 C) (!) 97.4 F (36.3 C)  TempSrc: Oral  Oral   SpO2: 100% 100% 100% 100%  Weight:      Height:        General: Chronically sick looking.  Very frail and  debilitated gentleman lying in bed.  Trying to eat breakfast.  Alert and awake. Cardiovascular: RRR, S1/S2 +, no rubs, no gallops Respiratory: Some conducted airway sounds. Abdominal: Soft, NT, ND, bowel sounds +, colostomy with loose stool. Extremities:   Chronic ulceration at the base of the scrotum and perineum, sacral area, stage IV sacral decubitus ulcer. Right above-knee amputation the stump clean.    The results of significant diagnostics from this hospitalization (including imaging, microbiology, ancillary and laboratory) are listed below for reference.     Microbiology: Recent Results (from the past 240 hour(s))  CULTURE, BLOOD (ROUTINE X 2) w Reflex to ID Panel     Status: None   Collection Time: 02/08/21 11:37 PM   Specimen: BLOOD  Result Value Ref Range Status   Specimen Description BLOOD BLOOD RIGHT HAND  Final   Special Requests   Final    BOTTLES DRAWN AEROBIC AND ANAEROBIC Blood Culture adequate volume   Culture   Final    NO GROWTH 5 DAYS Performed at [facility], [address] [place], [place]    Report Status 02/13/2021 FINAL  Final  CULTURE, BLOOD (ROUTINE X 2) w Reflex to ID Panel     Status: None   Collection Time: 02/08/21 11:43 PM   Specimen: BLOOD  Result Value Ref Range Status   Specimen Description BLOOD BLOOD LEFT HAND  Final   Special Requests   Final    BOTTLES DRAWN AEROBIC AND ANAEROBIC Blood Culture adequate volume   Culture   Final    NO GROWTH 5 DAYS Performed at [facility], [address], [place], [place]    Report Status 02/13/2021 FINAL  Final  Culture, blood (routine x 2)     Status: None (Preliminary result)   Collection Time: 02/16/21  1:46 AM   Specimen: BLOOD  Result Value Ref Range Status   Specimen Description BLOOD LEFT ARM  Final   Special Requests   Final    BOTTLES DRAWN AEROBIC AND ANAEROBIC Blood Culture adequate volume   Culture   Final    NO GROWTH 1 DAY Performed at  [facility], [address] [place], [place]    Report Status PENDING  Incomplete  Culture, blood (routine x 2)     Status: None (Preliminary result)   Collection Time: 02/16/21  2:47 AM   Specimen: BLOOD  Result Value Ref Range Status   Specimen Description BLOOD LEFT ARM  Final   Special Requests   Final    BOTTLES DRAWN AEROBIC AND ANAEROBIC Blood Culture adequate volume   Culture   Final    NO GROWTH 1 DAY Performed at [name], [address] [place], [place] [address]    Report Status PENDING  Incomplete     Labs: BNP (last 3 results) Recent Labs    05/20/20 2347  BNP 106.1*   Basic Metabolic Panel: Recent Labs  Lab 02/16/21 0150 02/16/21 0600 02/16/21 0632 02/16/21 1520 02/17/21 0923  NA 138 144 144 139 136  K 3.1* 6.1* >7.5* 3.5 3.1*  CL 114* 108 101 113* 110  CO2 18* 21* 20* 20* 20*  GLUCOSE 293* 182* 272* 170* 195*  BUN 13 15 13 15 14   CREATININE 0.82 0.91 0.81 0.78 0.74  CALCIUM 6.8* 6.6* 5.5* 7.5* 7.3*  MG 1.6*  --   --  1.9  --    Liver Function Tests: Recent Labs  Lab 02/16/21 0150 02/16/21 0632  AST 35 45*  ALT 35 33  ALKPHOS 88 80  BILITOT 0.3 0.4  PROT 4.2* 4.1*  ALBUMIN 1.0* 1.1*   No results for input(s): LIPASE, AMYLASE in the last 168 hours. No results for input(s): AMMONIA in the last 168 hours. CBC: Recent Labs  Lab 02/16/21 0150 02/16/21 0600 02/16/21 0907  WBC 9.4 9.4 10.8*  NEUTROABS 6.8  --   --   HGB 6.2* 9.1* 7.4*  HCT 21.2* 29.8* 23.8*  MCV 88.3 90.0 86.2  PLT 256 200 253   Cardiac Enzymes: No results for input(s): CKTOTAL, CKMB, CKMBINDEX, TROPONINI in the last 168 hours. BNP: Invalid input(s): POCBNP CBG: Recent Labs  Lab 02/17/21 0833 02/17/21 1237 02/17/21 1841 02/17/21 2058 02/18/21 0746  GLUCAP 182* 197* 101* 150* 100*   D-Dimer No results for input(s): DDIMER in the last 72 hours. Hgb A1c No results for input(s): HGBA1C in the last 72 hours. Lipid  Profile No results for input(s): CHOL, HDL, LDLCALC, TRIG, CHOLHDL, LDLDIRECT in the last 72 hours. Thyroid function studies No results for input(s): TSH, T4TOTAL, T3FREE, THYROIDAB in the last 72 hours.  Invalid input(s): FREET3 Anemia work up No results for input(s): VITAMINB12, FOLATE, FERRITIN, TIBC, IRON, RETICCTPCT in the last 72 hours. Urinalysis    Component Value Date/Time   COLORURINE AMBER (A) 02/16/2021 1500   APPEARANCEUR HAZY (A) 02/16/2021 1500   LABSPEC 1.020 02/16/2021 1500   PHURINE 5.0 02/16/2021 1500   GLUCOSEU 50 (A) 02/16/2021 1500   HGBUR NEGATIVE 02/16/2021 1500   BILIRUBINUR NEGATIVE 02/16/2021 1500   KETONESUR NEGATIVE 02/16/2021 1500   PROTEINUR 30 (A) 02/16/2021 1500   NITRITE NEGATIVE 02/16/2021 1500   LEUKOCYTESUR MODERATE (A) 02/16/2021 1500   Sepsis Labs Invalid input(s): PROCALCITONIN,  WBC,  LACTICIDVEN Microbiology Recent Results (from the past 240 hour(s))  CULTURE, BLOOD (ROUTINE X 2) w Reflex to ID Panel     Status: None   Collection Time: 02/08/21 11:37 PM   Specimen: BLOOD  Result Value Ref Range Status   Specimen Description BLOOD BLOOD RIGHT HAND  Final   Special Requests   Final    BOTTLES DRAWN AEROBIC AND ANAEROBIC Blood Culture adequate volume   Culture   Final    NO GROWTH 5 DAYS Performed at [facility], [address] [place], [place] [address]    Report Status 02/13/2021 FINAL  Final  CULTURE, BLOOD (ROUTINE X 2) w Reflex to ID Panel     Status: None  Collection Time: 02/08/21 11:43 PM   Specimen: BLOOD  Result Value Ref Range Status   Specimen Description BLOOD BLOOD LEFT HAND  Final   Special Requests   Final    BOTTLES DRAWN AEROBIC AND ANAEROBIC Blood Culture adequate volume   Culture   Final    NO GROWTH 5 DAYS Performed at [facility], [address] [place], [place] [address]    Report Status 02/13/2021 FINAL  Final  Culture, blood (routine x 2)     Status: None (Preliminary result)    Collection Time: 02/16/21  1:46 AM   Specimen: BLOOD  Result Value Ref Range Status   Specimen Description BLOOD LEFT ARM  Final   Special Requests   Final    BOTTLES DRAWN AEROBIC AND ANAEROBIC Blood Culture adequate volume   Culture   Final    NO GROWTH 1 DAY Performed at [facility], [address], [place], [place] [address]    Report Status PENDING  Incomplete  Culture, blood (routine x 2)     Status: None (Preliminary result)   Collection Time: 02/16/21  2:47 AM   Specimen: BLOOD  Result Value Ref Range Status   Specimen Description BLOOD LEFT ARM  Final   Special Requests   Final    BOTTLES DRAWN AEROBIC AND ANAEROBIC Blood Culture adequate volume   Culture   Final    NO GROWTH 1 DAY Performed at [facility], [address] [place], [place] [address]    Report Status PENDING  Incomplete     Time coordinating discharge: 40 minutes  SIGNED:   Dorcas Carrow, MD  [facility] 02/18/2021, 10:29 AM

## 2021-02-18 NOTE — Progress Notes (Signed)
MD order received in [facility] to discharge pt back to [facility] today; [facility] previously established paperwork for pt's return; report called to [facility] [phone] gave report to [facility]; pt's discharge pending arrival of EMS for nonemergency transport

## 2021-02-19 ENCOUNTER — Non-Acute Institutional Stay: Payer: Medicare Other | Admitting: Nurse Practitioner

## 2021-02-19 ENCOUNTER — Encounter: Payer: Self-pay | Admitting: Nurse Practitioner

## 2021-02-19 DIAGNOSIS — J449 Chronic obstructive pulmonary disease, unspecified: Secondary | ICD-10-CM

## 2021-02-19 DIAGNOSIS — Z515 Encounter for palliative care: Secondary | ICD-10-CM

## 2021-02-19 NOTE — Progress Notes (Signed)
Therapist, nutritional Palliative Care Consult Note Telephone: [phone]  Fax: [phone]  PATIENT NAME: ENNIS HEAVNER DOB: August 07, 1946 MRN: [id number]  PRIMARY CARE PROVIDER:   Eloisa Northern, MD/[facility]  I was asked to see Mr. Skirvin by Dr Nelson Chimes for Palliative care consult for complex medical decision making  RESPONSIBLE PARTY:   Self;  Daymon Hora cousin medical POA, [phone]  1. Advance Care Planning; DNR; ongoing discussion for medical goals of care  2. Goals of Care: Goals include to maximize quality of life and symptom management. Our advance care planning conversation included a discussion about:     The value and importance of advance care planning   Exploration of personal, cultural or spiritual beliefs that might influence medical decisions   Exploration of goals of care in the event of a sudden injury or illness   Identification and preparation of a healthcare agent   Review and updating or creation of an advance directive document.  3. Palliative care encounter; Palliative care encounter; Palliative medicine team will continue to support patient, patient's family, and medical team. Visit consisted of counseling and education dealing with the complex and emotionally intense issues of symptom management and palliative care in the setting of serious and potentially life-threatening illness  4. f/u 1 week for ongoing monitoring chronic disease progression, ongoing discussions complex medical decision making  I spent 50 minutes providing this consultation, starting at 1:00pm. More than 50% of the time in this consultation was spent coordinating communication.   HISTORY OF PRESENT ILLNESS:  THEODOR MUSTIN is a 75 y.o. year old male with multiple medical problems including COPD, CAD, severe peripheral vascular disease, right above the knee amputation, chronic sacral ulcers, chronic kidney disease, chronic anemia, severe  malnutrition, PTSD, chronic lymphedema. Hospitalized from 2 / 7 / 2022 to 2 / 13 / 2022  for respiratory distress. Workout significant for respiratory failure in the setting of COPD exacerbation s/p covid pneumonia weaned off oxygen. Profound acute kidney injury in the setting of poor oral intake resolved with IV fluids. Chronic iron deficiency anemia worsened over time required transfusion during hospitalization. Mr Dornfeld return to [facility] Hospitalized 02/16/2021 to 02/18/2021 for acute metabolic encephalopathy due to hypoglycemia, recurrent event, advanced debility, failure to thrive. Acute on chronic anemia of chronic disease hgb 6.2 requiring transfusion. Electrolyte abnormality replaced. Sacral decubitus ulcer present on admission. 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  He is on scheduled to finish his meropenem on 3/9.  Since patient is stable at this time, he can continue and finish his antibiotic therapy, severe protein calorie malnutrition//Adult failure to thrive/End-stage decubitus ulcer with sacral osteomyelitis/Profound physical debility/Recurrent hypoglycemia. Seen by Palliative and agreeable to comfort care at hospice level.  Mr Bagot is [facility]. Mr Capek does need staff assistance for transfers, mobility, bathing, dressing. Mr Staiger does feed himself. Heart healthy diabetic diet, regular texture, regular liquid consistency with nephro supplement; 3 / 1 / 2022 weight 119 lb; BMI 17.6. At present time Mr Kalla is sitting in the wheelchair on the smoking patio. Staff endorses Mr Deeg spends most of his time on the smoking patio. Mr Loudermilk appears debilitated, no distress. No visitors present. I visited and observed Mr Rahming. We talked about purpose of Palliative care visit. Mr. Berthold  agreed to visit. We talked about COPD. We talked about recent hospitalization. Mr Donahoe endorses it is what it is. We talked about smoking sensation which he politely declined. We talked about symptoms of pain and shortness of breath. Mr Wiedeman endorses he is comfortable. Mr Day and I talked about his appetite. Mr Pond endorses he does try to eat. We talked about food that Mr Bartol likes. We talked about regarding its skilled facility. Mr Kaufhold talked about quality of life. Mr Boeke endorses that "it is hard have a good quality of life when you're debilitated and having worsening symptoms, having to go back and forth to the hospital". Medical goals reviewed. Talked about briefly Life review. Heard about role of Palliative care in plan of care. Discuss with Mr. Goeken follow-up palliative care visit in one week for further discussion, chronic disease progression following. Mr Muzquiz in agreement. Therapeutical listening, emotional support provided. I have attempted to contact Mr Rog's cousin medical POA for discussion of Palliative care visit, complex medical decision-making. Updated nursing staff.  Palliative Care was asked to help address goals of care.   CODE STATUS: DNR  PPS: 40% HOSPICE ELIGIBILITY/DIAGNOSIS: TBD  PAST MEDICAL HISTORY:  Past Medical History:  Diagnosis Date  . Anemia   . CHF (congestive heart failure) (HCC)   . CKD (chronic kidney disease)   . COPD (chronic obstructive pulmonary disease) (HCC)   . Coronary artery disease   . Diabetes mellitus without complication (HCC)   . Dyspnea   . Hx of AKA (above knee amputation), left (HCC)   . Hypertension   . Intermittent confusion   . PAD (peripheral artery disease) (HCC)   . Pneumonia   . Pressure ulcer, sacrum   . PTSD (post-traumatic stress disorder)     SOCIAL HX:  Social History   Tobacco Use  . Smoking status: Former Smoker    Types: Cigarettes  . Smokeless tobacco: Former Engineer, water Use  Topics  . Alcohol use: Never    Comment: Drank heavily in the past per his cousin    ALLERGIES:  Allergies  Allergen Reactions  . Daptomycin   . Iodinated Diagnostic Agents      PHYSICAL EXAM:   General: debilitated, frail appearing, pleasant male Cardiovascular: regular rate and rhythm Pulmonary: clear ant fields Neurological: w/c dependent  Christin Prince Rome, NP

## 2021-02-20 ENCOUNTER — Other Ambulatory Visit: Payer: Self-pay

## 2021-02-20 ENCOUNTER — Emergency Department
Admission: EM | Admit: 2021-02-20 | Discharge: 2021-02-20 | Disposition: A | Discharge status: Home / Self Care | Payer: No Typology Code available for payment source | Attending: Emergency Medicine | Admitting: Emergency Medicine

## 2021-02-20 ENCOUNTER — Encounter: Payer: Self-pay | Admitting: Emergency Medicine

## 2021-02-20 DIAGNOSIS — Z79899 Other long term (current) drug therapy: Secondary | ICD-10-CM | POA: Insufficient documentation

## 2021-02-20 DIAGNOSIS — I509 Heart failure, unspecified: Secondary | ICD-10-CM | POA: Insufficient documentation

## 2021-02-20 DIAGNOSIS — E1122 Type 2 diabetes mellitus with diabetic chronic kidney disease: Secondary | ICD-10-CM | POA: Insufficient documentation

## 2021-02-20 DIAGNOSIS — E162 Hypoglycemia, unspecified: Secondary | ICD-10-CM

## 2021-02-20 DIAGNOSIS — M8668 Other chronic osteomyelitis, other site: Secondary | ICD-10-CM

## 2021-02-20 DIAGNOSIS — I2510 Atherosclerotic heart disease of native coronary artery without angina pectoris: Secondary | ICD-10-CM | POA: Diagnosis not present

## 2021-02-20 DIAGNOSIS — N189 Chronic kidney disease, unspecified: Secondary | ICD-10-CM | POA: Diagnosis not present

## 2021-02-20 DIAGNOSIS — Z87891 Personal history of nicotine dependence: Secondary | ICD-10-CM | POA: Insufficient documentation

## 2021-02-20 DIAGNOSIS — J449 Chronic obstructive pulmonary disease, unspecified: Secondary | ICD-10-CM | POA: Diagnosis not present

## 2021-02-20 DIAGNOSIS — E11649 Type 2 diabetes mellitus with hypoglycemia without coma: Secondary | ICD-10-CM | POA: Diagnosis not present

## 2021-02-20 DIAGNOSIS — I130 Hypertensive heart and chronic kidney disease with heart failure and stage 1 through stage 4 chronic kidney disease, or unspecified chronic kidney disease: Secondary | ICD-10-CM | POA: Diagnosis not present

## 2021-02-20 DIAGNOSIS — M4628 Osteomyelitis of vertebra, sacral and sacrococcygeal region: Secondary | ICD-10-CM | POA: Insufficient documentation

## 2021-02-20 LAB — COMPREHENSIVE METABOLIC PANEL
ALT: 61 U/L — ABNORMAL HIGH (ref 0–44)
AST: 59 U/L — ABNORMAL HIGH (ref 15–41)
Albumin: 1.2 g/dL — ABNORMAL LOW (ref 3.5–5.0)
Alkaline Phosphatase: 151 U/L — ABNORMAL HIGH (ref 38–126)
Anion gap: 4 — ABNORMAL LOW (ref 5–15)
BUN: 14 mg/dL (ref 8–23)
CO2: 23 mmol/L (ref 22–32)
Calcium: 7.9 mg/dL — ABNORMAL LOW (ref 8.9–10.3)
Chloride: 107 mmol/L (ref 98–111)
Creatinine, Ser: 0.84 mg/dL (ref 0.61–1.24)
GFR, Estimated: >60 mL/min (ref >60)
Glucose, Bld: 100 mg/dL — ABNORMAL HIGH (ref 70–99)
Potassium: 5.7 mmol/L — ABNORMAL HIGH (ref 3.5–5.1)
Sodium: 134 mmol/L — ABNORMAL LOW (ref 135–145)
Total Bilirubin: 0.7 mg/dL (ref 0.3–1.2)
Total Protein: 5.2 g/dL — ABNORMAL LOW (ref 6.5–8.1)

## 2021-02-20 LAB — CBG MONITORING, ED
Glucose-Capillary: 68 mg/dL — ABNORMAL LOW (ref 70–99)
Glucose-Capillary: 60 mg/dL — ABNORMAL LOW (ref 70–99)
Glucose-Capillary: 174 mg/dL — ABNORMAL HIGH (ref 70–99)
Glucose-Capillary: 172 mg/dL — ABNORMAL HIGH (ref 70–99)

## 2021-02-20 LAB — CBC WITH DIFFERENTIAL/PLATELET
Abs Immature Granulocytes: 0.08 10*3/uL — ABNORMAL HIGH (ref 0.00–0.07)
Basophils Absolute: 0 10*3/uL (ref 0.0–0.1)
Basophils Relative: 0 %
Eosinophils Absolute: 0.1 10*3/uL (ref 0.0–0.5)
Eosinophils Relative: 1 %
HCT: 28 % — ABNORMAL LOW (ref 39.0–52.0)
Hemoglobin: 8.4 g/dL — ABNORMAL LOW (ref 13.0–17.0)
Immature Granulocytes: 1 %
Lymphocytes Relative: 22 %
Lymphs Abs: 2.1 10*3/uL (ref 0.7–4.0)
MCH: 26.4 pg (ref 26.0–34.0)
MCHC: 30 g/dL (ref 30.0–36.0)
MCV: 88.1 fL (ref 80.0–100.0)
Monocytes Absolute: 0.6 10*3/uL (ref 0.1–1.0)
Monocytes Relative: 7 %
Neutro Abs: 6.6 10*3/uL (ref 1.7–7.7)
Neutrophils Relative %: 69 %
Platelets: 403 10*3/uL — ABNORMAL HIGH (ref 150–400)
RBC: 3.18 MIL/uL — ABNORMAL LOW (ref 4.22–5.81)
RDW: 19.8 % — ABNORMAL HIGH (ref 11.5–15.5)
Smear Review: NORMAL
WBC: 9.5 10*3/uL (ref 4.0–10.5)
nRBC: 0 % (ref 0.0–0.2)

## 2021-02-20 MED ORDER — LACTATED RINGERS IV BOLUS
1000.0000 mL | Freq: Once | INTRAVENOUS | Status: AC
  Administered 2021-02-20: 1000 mL via INTRAVENOUS

## 2021-02-20 MED ORDER — DEXTROSE 50 % IV SOLN
INTRAVENOUS | Status: AC
  Administered 2021-02-20: 50 mL
  Filled 2021-02-20: qty 50

## 2021-02-20 NOTE — ED Notes (Signed)
Patient eating apple sauce and peanut butter with SP.

## 2021-02-20 NOTE — ED Notes (Signed)
Patient drinking grape juice and eating peanut butter and crackers at this time.

## 2021-02-20 NOTE — ED Notes (Signed)
Patient eating lunch tray at this time °

## 2021-02-20 NOTE — ED Notes (Signed)
Phelma, RN at [facility] received report on patient at this time.

## 2021-02-20 NOTE — ED Notes (Signed)
Patient drinking more grape juice and graham crackers/peanut butter.

## 2021-02-20 NOTE — ED Triage Notes (Signed)
Patient arrives via EMS from [facility] for hypoglycemia. Patient was initially 47 and after 1250 mL Dextrose cbg was 92. Patient has not been eating properly per facility. Patient AOx4 at this time.

## 2021-02-20 NOTE — ED Notes (Signed)
Called dietary for meal tray

## 2021-02-20 NOTE — ED Notes (Signed)
Pt given ice cream. Pt is comfortable and resting.

## 2021-02-20 NOTE — ED Provider Notes (Signed)
[facility] Emergency Department Provider Note   ____________________________________________   Event Date/Time   First MD Initiated Contact with Patient 02/20/21 1116     (approximate)  I have reviewed the triage vital signs and the nursing notes.   HISTORY  Chief Complaint Hypoglycemia    HPI Yoshio Seliga Humiston is a 75 y.o. male with past medical history of hypertension, diabetes, CAD, COPD, peripheral arterial disease, and large sacral ulcers with chronic osteomyelitis who presents to the ED for hyperglycemia. Patient currently resides at [facility] healthcare, where he was found to be hypoglycemic when less alert earlier today. He reportedly has had poor p.o. intake at the facility and was recently admitted for a similar presentation. He completed a course of IV meropenem via PICC line for his acute on chronic osteomyelitis, had been doing well at the time of discharge. His insulin was discontinued at discharge as patient had made the decision to proceed with hospice and palliative care. Patient received D10 infusion with EMS, blood sugar subsequently improved to 92. Patient now more awake and alert, denies any complaints.        Past Medical History:  Diagnosis Date  . Anemia   . CHF (congestive heart failure) (HCC)   . CKD (chronic kidney disease)   . COPD (chronic obstructive pulmonary disease) (HCC)   . Coronary artery disease   . Diabetes mellitus without complication (HCC)   . Dyspnea   . Hx of AKA (above knee amputation), left (HCC)   . Hypertension   . Intermittent confusion   . PAD (peripheral artery disease) (HCC)   . Pneumonia   . Pressure ulcer, sacrum   . PTSD (post-traumatic stress disorder)     Patient Active Problem List   Diagnosis Date Noted  . Hypoglycemia 02/16/2021  . Acute metabolic encephalopathy 02/06/2021  . Hypoglycemia due to insulin 02/06/2021  . PVD (peripheral vascular disease) (HCC) 02/06/2021  . Chronic  osteomyelitis of sacrum (HCC) 02/06/2021  . COPD (chronic obstructive pulmonary disease) (HCC) 02/06/2021  . Scrotal ulcer 02/06/2021  . Cellulitis of scrotum 02/06/2021  . Cellulitis, scrotum 02/06/2021  . Wheelchair bound 02/06/2021  . Sepsis (HCC) 02/06/2021  . Elevated LFTs 02/06/2021  . AKI (acute kidney injury) (HCC) 01/22/2021  . Acute on chronic respiratory failure with hypoxia (HCC) 01/22/2021  . Decubitus ulcer of sacral region, stage 4 (HCC) 01/22/2021  . Severe protein-energy malnutrition (HCC) 01/22/2021  . S/P AKA (above knee amputation) (HCC) 01/22/2021  . Hypoxia 01/22/2021  . Acute CHF (HCC) 05/20/2020  . Healthcare-associated pneumonia   . DNR (do not resuscitate) discussion   . Palliative care encounter   . Pressure injury of skin 08/16/2017  . Acute encephalopathy   . Chronic anemia 08/15/2017  . Acute respiratory failure [facility])     Past Surgical History:  Procedure Laterality Date  . ABDOMINAL SURGERY    . ABOVE KNEE LEG AMPUTATION    . LEG AMPUTATION ABOVE KNEE Right    PREVIOUS SURGERY    Prior to Admission medications   Medication Sig Start Date End Date Taking? Authorizing Provider  acetaminophen (TYLENOL) 325 MG tablet Take 650 mg by mouth every 4 (four) hours as needed.    [provider]  Ascorbic Acid (VITAMIN C) 1000 MG tablet Take 1,000 mg by mouth daily.    [provider]  atorvastatin (LIPITOR) 40 MG tablet Take 40 mg by mouth every evening.    [provider]  carvedilol (COREG) 6.25 MG tablet  Take 1 tablet (6.25 mg total) by mouth 2 (two) times daily with a meal. 01/28/21   Azucena Fallen, MD  Cholecalciferol (VITAMIN D) 125 MCG (5000 UT) CAPS Take 1,000 Units by mouth daily.    [provider]  ferrous sulfate (FER-IN-SOL) 75 (15 Fe) MG/ML SOLN Take 4.3 mLs (65 mg of iron total) by mouth 3 (three) times daily with meals. 01/28/21 02/27/21  Azucena Fallen, MD  folic acid (FOLVITE) 1 MG tablet Take  1 mg by mouth daily.    [provider]  gabapentin (NEURONTIN) 100 MG capsule Take 100 mg by mouth 3 (three) times daily.    [provider]  mirtazapine (REMERON) 7.5 MG tablet Take 7.5 mg by mouth at bedtime.    [provider]  Multiple Vitamin (MULTIVITAMIN WITH MINERALS) TABS tablet Take 1 tablet by mouth daily. 02/13/21   Enedina Finner, MD  Nutritional Supplements (FEEDING SUPPLEMENT, NEPRO CARB STEADY,) LIQD Take 237 mLs by mouth 3 (three) times daily between meals. 02/13/21   Enedina Finner, MD  nystatin (MYCOSTATIN) 100000 UNIT/ML suspension Take 5 mLs by mouth 4 (four) times daily.    [provider]  omeprazole (PRILOSEC) 20 MG capsule Take 20 mg by mouth 2 (two) times daily before a meal.    [provider]  oxyCODONE (OXY IR/ROXICODONE) 5 MG immediate release tablet Take 1 tablet (5 mg total) by mouth every 6 (six) hours as needed for up to 5 days for severe pain or breakthrough pain. 02/18/21 02/23/21  Dorcas Carrow, MD  sertraline (ZOLOFT) 50 MG tablet Take 50 mg by mouth daily.    [provider]  tamsulosin (FLOMAX) 0.4 MG CAPS capsule Take 0.4 mg by mouth daily.    [provider]  zinc sulfate 220 (50 Zn) MG capsule Take 220 mg by mouth daily.    [provider]    Allergies Daptomycin and Iodinated diagnostic agents  Family History  Problem Relation Age of Onset  . CAD Neg Hx     Social History Social History   Tobacco Use  . Smoking status: Former Smoker    Types: Cigarettes  . Smokeless tobacco: Former Clinical biochemist  . Vaping Use: Never used  Substance Use Topics  . Alcohol use: Never    Comment: Drank heavily in the past per his cousin  . Drug use: Never    Review of Systems  Constitutional: No fever/chills. Positive for hypoglycemia. Eyes: No visual changes. ENT: No sore throat. Cardiovascular: Denies chest pain. Respiratory: Denies shortness of breath. Gastrointestinal: No abdominal  pain.  No nausea, no vomiting.  No diarrhea.  No constipation. Genitourinary: Negative for dysuria. Musculoskeletal: Negative for back pain. Skin: Negative for rash. Neurological: Negative for headaches, focal weakness or numbness.  ____________________________________________   PHYSICAL EXAM:  VITAL SIGNS: ED Triage Vitals  Enc Vitals Group     BP      Pulse      Resp      Temp      Temp src      SpO2      Weight      Height      Head Circumference      Peak Flow      Pain Score      Pain Loc      Pain Edu?      Excl. in GC?     Constitutional: Awake and alert. Eyes: Conjunctivae are normal. Head: Atraumatic. Nose: No  congestion/rhinnorhea. Mouth/Throat: Mucous membranes are moist. Neck: Normal ROM Cardiovascular: Normal rate, regular rhythm. Grossly normal heart sounds. Respiratory: Normal respiratory effort.  No retractions. Lungs CTAB. Gastrointestinal: Colostomy bag in place, soft and nontender. No distention. Genitourinary: deferred Musculoskeletal: No lower extremity tenderness nor edema. Neurologic:  Normal speech and language. No gross focal neurologic deficits are appreciated. Skin: Large sacral decubitus ulcers with dressings in place. Psychiatric: Mood and affect are normal. Speech and behavior are normal.  ____________________________________________   LABS (all labs ordered are listed, but only abnormal results are displayed)  Labs Reviewed  CBC WITH DIFFERENTIAL/PLATELET - Abnormal; Notable for the following components:      Result Value   RBC 3.18 (*)    Hemoglobin 8.4 (*)    HCT 28.0 (*)    RDW 19.8 (*)    Platelets 403 (*)    Abs Immature Granulocytes 0.08 (*)    All other components within normal limits  COMPREHENSIVE METABOLIC PANEL - Abnormal; Notable for the following components:   Sodium 134 (*)    Potassium 5.7 (*)    Glucose, Bld 100 (*)    Calcium 7.9 (*)    Total Protein 5.2 (*)    Albumin 1.2 (*)    AST 59 (*)    ALT 61  (*)    Alkaline Phosphatase 151 (*)    Anion gap 4 (*)    All other components within normal limits  CBG MONITORING, ED - Abnormal; Notable for the following components:   Glucose-Capillary 68 (*)    All other components within normal limits  CBG MONITORING, ED - Abnormal; Notable for the following components:   Glucose-Capillary 60 (*)    All other components within normal limits  CBG MONITORING, ED - Abnormal; Notable for the following components:   Glucose-Capillary 174 (*)    All other components within normal limits  CBG MONITORING, ED - Abnormal; Notable for the following components:   Glucose-Capillary 172 (*)    All other components within normal limits  URINALYSIS, COMPLETE (UACMP) WITH MICROSCOPIC     PROCEDURES  Procedure(s) performed (including Critical Care):  Procedures   ____________________________________________   INITIAL IMPRESSION / ASSESSMENT AND PLAN / ED COURSE       75 year old male with past medical history of hypertension, diabetes, CAD, COPD, PAD, and sacral ulcers with chronic osteomyelitis who presents to the ED for hyperglycemia. Hyperglycemia is improved on arrival after administration of D10, this appears likely to be due to patient's poor p.o. intake at nursing facility. He is awake and alert here and denies any complaints, no evidence of recurrent infection at this time. We will screen labs and monitor for recurrent hypoglycemia.  Labs are remarkable for mild hyperkalemia, likely secondary to hemolysis. Patient was treated with 1 L IV fluids which should improve any hyperkalemia present. He did require transfusion during prior admission, although hemoglobin is stable at this time and no evidence of bleeding. He did drop blood sugar into the 60s but remained awake and alert. He was given an amp of D50 as well as a meal, now maintaining blood sugar on 2 subsequent rechecks. He is appropriate for discharge back to nursing facility with plans to  follow-up with palliative care as previously scheduled. Patient counseled to return to the ED for new worsening symptoms, patient agrees to plan.      ____________________________________________   FINAL CLINICAL IMPRESSION(S) / ED DIAGNOSES  Final diagnoses:  Hypoglycemia  Chronic osteomyelitis of sacrum (HCC)  ED Discharge Orders    None       Note:  This document was prepared using Dragon voice recognition software and may include unintentional dictation errors.   Chesley Noon, MD 02/20/21 [phone]

## 2021-02-21 LAB — CULTURE, BLOOD (ROUTINE X 2)
Culture: NO GROWTH
Culture: NO GROWTH
Special Requests: ADEQUATE
Special Requests: ADEQUATE

## 2021-02-26 ENCOUNTER — Encounter: Payer: Self-pay | Admitting: Nurse Practitioner

## 2021-02-26 ENCOUNTER — Non-Acute Institutional Stay: Payer: Medicare Other | Admitting: Nurse Practitioner

## 2021-02-26 DIAGNOSIS — I739 Peripheral vascular disease, unspecified: Secondary | ICD-10-CM

## 2021-02-26 DIAGNOSIS — J449 Chronic obstructive pulmonary disease, unspecified: Secondary | ICD-10-CM

## 2021-02-26 DIAGNOSIS — Z515 Encounter for palliative care: Secondary | ICD-10-CM

## 2021-02-26 NOTE — Progress Notes (Signed)
Therapist, nutritional Palliative Care Consult Note Telephone: [phone]  Fax: [phone]  PATIENT NAME: Blake Villarreal DOB: [birth year] MRN: [id number]  PRIMARY CARE PROVIDER:   Eloisa Northern, MD/[facility]  RESPONSIBLE PARTY:   Self;  Recardo Villarreal cousin medical POA, [phone]  1.Advance Care Planning;DNR; ongoing discussion for medical goals of care  2. Goals of Care: Goals include to maximize quality of life and symptom management. Our advance care planning conversation included a discussion about:   The value and importance of advance care planning  Exploration of personal, cultural or spiritual beliefs that might influence medical decisions  Exploration of goals of care in the event of a sudden injury or illness  Identification and preparation of a healthcare agent  Review and updating or creation of anadvance directive document.  3.Palliative care encounter; Palliative care encounter; Palliative medicine team will continue to support patient, patient's family, and medical team. Visit consisted of counseling and education dealing with the complex and emotionally intense issues of symptom management and palliative care in the setting of serious and potentially life-threatening illness  I spent 50 minutes providing this consultation, starting at 12:45pm. More than 50% of the time in this consultation was spent coordinating communication.   HISTORY OF PRESENT ILLNESS:  Blake Villarreal is a 75 y.o. year old male with multiple medical problems including COPD, CAD, severe peripheral vascular disease, right above the knee amputation, chronic sacral ulcers, chronic kidney disease, chronic anemia, severe malnutrition, PTSD, chronic lymphedema. Blake Villarreal continues to reside at [facility]. Blake Villarreal requires staff to [employer] lift to electric w/c, total ADL dependence for bathing, dressing, feeding. Blake.  Villarreal spends most of his time on the smoking patio per staff.   Hospitalized from 2 / 7 / 2022 to 2 / 13 / 2022  for respiratory distress. Workout significant for respiratory failure in the setting of COPD exacerbation s/p covid pneumonia weaned off oxygen. Profound acute kidney injury in the setting of poor oral intake resolved with IV fluids. Chronic iron deficiency anemia worsened over time required transfusion during hospitalization.   Hospitalized 02/16/2021 to 02/18/2021 for acute metabolic encephalopathy due to hypoglycemia, recurrent event, advanced debility, failure to thrive. Acute on chronic anemia of chronic disease hgb 6.2 requiring transfusion. Electrolyte abnormality replaced. Sacral decubitus ulcer present on admission.  Osteomyelitis of the sacrum with VRE and ESBL E. coli as well as Proteus bacteremia: protein calorie malnutrition//Adult failure to thrive/End-stage decubitus ulcer with sacral osteomyelitis/Profound physical debility/Recurrent hypoglycemia. Seen by Palliative in hospital and agreeable to comfort care at hospice level.    02/20/2021 ED visit for hypoglycemia requiring d50  Functionally: hoyer lift to electric w/c which he maneuvers; total ADL care for bathing, dressing, positioning. He does feed himself with weight 119 lb; BMI 17.6. 61lbs weight loss in 3 months; 33.89% loss; albumin 1.2; total protein 5.2  DNR; wishes are for comfort care  I visited and observed Blake Villarreal on the smoking patio. Blake Villarreal and I talked about how he was feeling. Blake Villarreal endorses he is suppose to meet with Hospice on Wednesday. Blake Villarreal talked about Hospice, asked many questions, answered to satisfaction. We talked about Hospice benefit under Medicare, what services is provided. We talked about medical goals of comfort. Blake Villarreal wishes to have comfort care, staying at the facility, avoiding hospitalizations. Blake Villarreal endorses he just wants to be able to smoke. We talked about  symptoms of pain, shortness of  breath, appetite. We talked about residing at facility, relying on others for care, challenges at facility with coping strategies. We talked about purpose of PC in plan of care. Therapeutic listening, emotional support provided. I called Blake Villarreal cousin, Blake Villarreal. Clinical update given. We talked about PC visit, discussion with Blake Villarreal. We talked about medical goals of care. We talked about Hospice services and upcoming AV visit. We talked about role of PC in POC. Therapeutic listening, emotional support provided. Questions answered. I updated staff. Case sent to Hospice Physicians to review for eligible which found eligible.   Palliative Care was asked to help to continue to address goals of care.   CODE STATUS: DNR  PPS: 30% HOSPICE ELIGIBILITY/DIAGNOSIS: TBD  PAST MEDICAL HISTORY:  Past Medical History:  Diagnosis Date  . Anemia   . CHF (congestive heart failure) (HCC)   . CKD (chronic kidney disease)   . COPD (chronic obstructive pulmonary disease) (HCC)   . Coronary artery disease   . Diabetes mellitus without complication (HCC)   . Dyspnea   . Hx of AKA (above knee amputation), left (HCC)   . Hypertension   . Intermittent confusion   . PAD (peripheral artery disease) (HCC)   . Pneumonia   . Pressure ulcer, sacrum   . PTSD (post-traumatic stress disorder)     SOCIAL HX:  Social History   Tobacco Use  . Smoking status: Former Smoker    Types: Cigarettes  . Smokeless tobacco: Former Engineer, water Use Topics  . Alcohol use: Never    Comment: Drank heavily in the past per his cousin    ALLERGIES:  Allergies  Allergen Reactions  . Daptomycin   . Iodinated Diagnostic Agents       PHYSICAL EXAM:   General: debilitated, chronically ill, pleasant male Cardiovascular: regular rate and rhythm Pulmonary: poor air movement; decreased bases Neurological: functional quadriplegic  Blake Hark Prince Rome, NP

## 2021-02-27 ENCOUNTER — Other Ambulatory Visit: Payer: Self-pay

## 2021-02-27 LAB — SULFONYLUREA HYPOGLYCEMICS PANEL, SERUM
Acetohexamide: NEGATIVE ug/mL (ref 20–60)
Chlorpropamide: NEGATIVE ug/mL (ref 75–250)
Glimepiride: NEGATIVE ng/mL (ref 80–250)
Glipizide: NEGATIVE ng/mL (ref 200–1000)
Glyburide: NEGATIVE ng/mL
Nateglinide: NEGATIVE ng/mL
Repaglinide: NEGATIVE ng/mL
Tolazamide: NEGATIVE ug/mL
Tolbutamide: NEGATIVE ug/mL (ref 40–100)

## 2021-03-16 DEATH — deceased

## 2022-10-23 NOTE — Telephone Encounter (Signed)
Error
